# Patient Record
Sex: Male | Born: 1950 | Race: White | Hispanic: No | Marital: Married | Smoking: Former smoker
Health system: Southern US, Community
[De-identification: ages and names within clinical notes are randomized; demographics above are authoritative.]

## PROBLEM LIST (undated history)

## (undated) DIAGNOSIS — J189 Pneumonia, unspecified organism: Secondary | ICD-10-CM

## (undated) DIAGNOSIS — I2699 Other pulmonary embolism without acute cor pulmonale: Secondary | ICD-10-CM

## (undated) DIAGNOSIS — M79601 Pain in right arm: Principal | ICD-10-CM

## (undated) DIAGNOSIS — M1990 Unspecified osteoarthritis, unspecified site: Secondary | ICD-10-CM

## (undated) DIAGNOSIS — C801 Malignant (primary) neoplasm, unspecified: Secondary | ICD-10-CM

## (undated) DIAGNOSIS — I10 Essential (primary) hypertension: Secondary | ICD-10-CM

## (undated) DIAGNOSIS — J449 Chronic obstructive pulmonary disease, unspecified: Secondary | ICD-10-CM

## (undated) DIAGNOSIS — K219 Gastro-esophageal reflux disease without esophagitis: Secondary | ICD-10-CM

## (undated) DIAGNOSIS — Z923 Personal history of irradiation: Secondary | ICD-10-CM

## (undated) DIAGNOSIS — E785 Hyperlipidemia, unspecified: Secondary | ICD-10-CM

## (undated) DIAGNOSIS — K635 Polyp of colon: Secondary | ICD-10-CM

## (undated) DIAGNOSIS — J9691 Respiratory failure, unspecified with hypoxia: Secondary | ICD-10-CM

## (undated) HISTORY — DX: Pain in right arm: M79.601

## (undated) HISTORY — PX: INGUINAL HERNIA REPAIR: SUR1180

## (undated) HISTORY — DX: Hyperlipidemia, unspecified: E78.5

## (undated) HISTORY — DX: Polyp of colon: K63.5

## (undated) HISTORY — DX: Essential (primary) hypertension: I10

## (undated) HISTORY — DX: Gastro-esophageal reflux disease without esophagitis: K21.9

## (undated) HISTORY — PX: FINGER SURGERY: SHX640

---

## 1999-05-14 ENCOUNTER — Ambulatory Visit (HOSPITAL_BASED_OUTPATIENT_CLINIC_OR_DEPARTMENT_OTHER): Admission: RE | Admit: 1999-05-14 | Discharge: 1999-05-14 | Payer: Self-pay | Admitting: Otolaryngology

## 2003-07-26 ENCOUNTER — Ambulatory Visit (HOSPITAL_COMMUNITY): Admission: RE | Admit: 2003-07-26 | Discharge: 2003-07-26 | Payer: Self-pay | Admitting: Internal Medicine

## 2004-02-05 ENCOUNTER — Encounter: Admission: RE | Admit: 2004-02-05 | Discharge: 2004-02-05 | Payer: Self-pay | Admitting: General Surgery

## 2004-02-07 ENCOUNTER — Ambulatory Visit (HOSPITAL_BASED_OUTPATIENT_CLINIC_OR_DEPARTMENT_OTHER): Admission: RE | Admit: 2004-02-07 | Discharge: 2004-02-07 | Payer: Self-pay | Admitting: General Surgery

## 2004-02-07 ENCOUNTER — Ambulatory Visit (HOSPITAL_COMMUNITY): Admission: RE | Admit: 2004-02-07 | Discharge: 2004-02-07 | Payer: Self-pay | Admitting: General Surgery

## 2008-09-23 ENCOUNTER — Emergency Department (HOSPITAL_COMMUNITY): Admission: EM | Admit: 2008-09-23 | Discharge: 2008-09-23 | Payer: Self-pay | Admitting: Emergency Medicine

## 2010-07-12 NOTE — Op Note (Signed)
NAME:  Ricky Villanueva, Ricky Villanueva                          ACCOUNT NO.:  [id number]   MEDICAL RECORD NO.:  [id number]                   PATIENT TYPE:  AMB   LOCATION:  DAY                                  FACILITY:  [facility]   PHYSICIAN:  Lionel December, M.D.                 DATE OF BIRTH:  03-08-1950   DATE OF PROCEDURE:  07/26/2003  DATE OF DISCHARGE:                                 OPERATIVE REPORT   PROCEDURE:  Total colonoscopy.   INDICATION:  Ricky Villanueva is a 60 year old, Caucasian male, who is here for  screening colonoscopy.  Family history is negative for colorectal carcinoma.   Procedure risks were reviewed with the patient.  Informed consent was  obtained.   PREOPERATIVE MEDICATIONS:  1. Demerol 50 mg IV.  2. Versed 10 mg IV in divided dose.   FINDINGS:  Procedure performed in endoscopy suite.  The patient's vital  signs and 02 saturations were monitored during the procedure and remained  stable.  The patient was placed in left lateral decubitus position and  rectal examination performed.  No abnormality noted in external or digital  exam.  Olympus video scope was placed in the rectum and advanced under  vision to the sigmoid colon and beyond.  Preparation was satisfactory.  Scope was passed in the cecum which was identified by ileocecal valve and  appendiceal orifice.  Pictures taken for the record.  As the scope was  withdrawn, colonic mucosa was carefully examined.  There was a single small  polyp at sigmoid colon which was ablated via cold biopsy.  Mucosa of rest of  the colon and rectum was normal.  The scope was retroflexed in rectum to  examine anorectal junction, and moderate-sized hemorrhoids were noted below  the dentate line. Endoscope was straightened and withdrawn.  The patient  tolerated the procedure well.   FINAL DIAGNOSES:  1. Small polyp at sigmoid colon which was ablated via cold biopsy.  2. Moderate-sized external hemorrhoids.   RECOMMENDATIONS:  1. Standard  instructions given.  2. I will be contacting patient with biopsy results and further     recommendations.      ___________________________________________                                            Lionel December, M.D.   NR/MEDQ  D:  07/26/2003  T:  07/27/2003  Job:  [id number]   cc:   Colon Flattery, D.O.  [address]  [place]  [place] [address]  Fax: [phone]

## 2010-07-12 NOTE — Op Note (Signed)
[place]  Patient:    Ricky Villanueva, Ricky Villanueva                       MRN: [id number] Proc. Date: 05/14/99 Adm. Date:  [id number] Attending:  Carlean Purl CC:         Kristine Garbe. Ezzard Standing, M.D.                           Operative Report  PREOPERATIVE DIAGNOSIS: 1. Right forehead sebaceous cyst. 2. Right nasal nodule.  POSTOPERATIVE DIAGNOSIS: 1. Right forehead sebaceous cyst. 2. Right nasal nodule.  OPERATION PERFORMED: 1. Excision of right forehead sebaceous cyst. 2. Excision of right intranasal cartilage nodule.  SURGEON:  Kristine Garbe. Ezzard Standing, M.D.  ANESTHESIA:  Local Xylocaine with epinephrine.  COMPLICATIONS:  None.  INDICATIONS FOR PROCEDURE:  [name] is a 60 year old gentleman who has had a persistent nodule on his forehead ever since he had trauma to the forehead several years ago.  He also has a nodule inside his right nostril adjacent to the cartilaginous septum which he states intermittently swells up, causes pain a couple of times every year.  On examination, he has approximately 1 cm nodule toward the end of the cartilaginous septum.  In addition, he has a small nodule on the forehead consistent with a probable cyst.  He is taken to the operating room at  this time for excision of these two lesions under local anesthetic.  DESCRIPTION OF PROCEDURE:  First the forehead was prepped with Betadine solution. The area was injected with Xylocaine with epinephrine.  A small incision was made over the nodule and a sebaceous cyst was encountered.  This was removed, sent to pathology.  The small defect was closed with single 5-0 nylon suture.  Next, the nose was injected with Xylocaine with epinephrine.  A small incision was made directly over the nodule at the superior cephalad portion of the cartilaginous septum.  A nodular mass was encountered and this appeared to represent just cartilage.  There was no  cyst involved.  Dissection around this cartilage again  revealed no obvious cyst.  The nodular cartilage was removed and the defect was  closed with a single interrupted 4-0 chromic suture.  Bacitracin ointment was applied to both excision sites.  The patient tolerated the procedure well and was discharged home.  DISPOSITION:  The patient will follow up in my office in two weeks for recheck nd have the sutures removed.  He was given Tylenol p.r.n. pain. DD:  05/14/99 TD:  05/15/99 Job: [id number]

## 2010-07-12 NOTE — Op Note (Signed)
NAME:  Ricky Villanueva, Ricky Villanueva                ACCOUNT NO.:  [id number]   MEDICAL RECORD NO.:  [id number]          PATIENT TYPE:  AMB   LOCATION:  DSC                          FACILITY:  [facility]   PHYSICIAN:  Ollen Gross. Vernell Morgans, M.D. DATE OF BIRTH:  10/18/1950   DATE OF PROCEDURE:  02/07/2004  DATE OF DISCHARGE:  02/07/2004                                 OPERATIVE REPORT   PREOPERATIVE DIAGNOSIS:  Right inguinal hernia.   POSTOPERATIVE DIAGNOSIS:  Right direct inguinal hernia.   OPERATION PERFORMED:  Right inguinal hernia repair with mesh.   SURGEON:  Ollen Gross. Carolynne Edouard, M.D.   ANESTHESIA:  General via LMA.   DESCRIPTION OF PROCEDURE:  After informed consent was obtained, the patient  was brought to the operating room and placed in supine position on the  operating table.  After adequate induction of general anesthesia, the  patient's abdomen and right groin were prepped with Betadine and draped in  the usual sterile manner.  The right groin was then infiltrated with 0.25%  Marcaine.  A small incision was made from the edge of the pubic tubercle on  the right towards the anterior superior iliac spine for a distance of about  5 cm.  This incision was carried down through the skin and subcutaneous  tissue sharply with the electrocautery.  A small bridging vein was  encountered.  It was clamped with hemostats, divided and ligated with 3-0  silk sites.  Dissection was then carried deep to this sharply with the  electrocautery until the fascia of the external oblique was encountered.  The external oblique fascia was opened along its fibers towards the apex of  the external ring sharply with a 15 blade knife and Metzenbaum scissors.  The ilioinguinal nerve was identified and was involved with some scar  tissue.  It was ligated both proximally and distally with hemostats, divided  and ligated with 3-0 silk ties.  A Weitlaner retractor was deployed.  Blunt  dissection was then carried out at the edge of the  pubic tubercle until the  cord structures could be surrounded between two fingers.  A half inch  Penrose drain was placed around the cord structures for retraction purposes.  The cord structures were then skeletonized by a combination of blunt  dissection with hemostats and sharp dissection with the electrocautery.  No  hernia sac was identified within the cord structures.  There was a broad  based small medial defect to this in the floor of the inguinal canal.  This  was able to be easily reduced and repaired with interrupted 0 Vicryl  stitches.  A 3 x 6 piece of Davol mesh was then chosen and cut to fit.  Tails were cut in the mesh laterally.  The mesh was then sewed inferiorly to  the shelving edge of the inguinal ligament using running 2-0 Prolene stitch.  The mesh was sewed superiorly to the muscular aponeurotic strength layer of  the transversalis using interrupted 2-0 Prolene vertical mattress stitches.  The tails of the mesh were wrapped around the cord structures and anchored  laterally  to the shelving edge of the inguinal ligament using an interrupted  2-0 Prolene stitch.  Once this was accomplished, the mesh was in good  position without any tension.  The wound was then irrigated with copious  amounts of saline.  The external oblique was reapproximated with a running 2-  0 Vicryl stitch.  The wound was infiltrated with the rest of .25% Marcaine.  The subcutaneous fascia was closed with a running 3-0 Vicryl stitch and  the skin was closed with a running 4-0 Monocryl subcuticular stitch.  Benzoin and Steri-Strips and sterile dressings were applied.  The patient  tolerated the procedure well.  At the end of the case all sponge, needle and  instrument counts were correct.  The patient was then awakened and taken to  the recovery room in stable condition.      [name]   PST/MEDQ  D:  02/09/2004  T:  02/09/2004  Job:  [id number]

## 2012-05-20 ENCOUNTER — Telehealth: Payer: Self-pay | Admitting: Family Medicine

## 2012-05-20 NOTE — Telephone Encounter (Signed)
wtbs with fpw. Acid reflux? Eats zantac like crazy

## 2012-05-20 NOTE — Telephone Encounter (Signed)
appt given for 05/25/12

## 2012-05-25 ENCOUNTER — Encounter: Payer: Self-pay | Admitting: Family Medicine

## 2012-05-25 ENCOUNTER — Ambulatory Visit (INDEPENDENT_AMBULATORY_CARE_PROVIDER_SITE_OTHER): Payer: BC Managed Care – PPO | Admitting: Family Medicine

## 2012-05-25 VITALS — BP 144/85 | HR 63 | Temp 97.1°F | Ht 68.0 in | Wt 140.0 lb

## 2012-05-25 DIAGNOSIS — R7989 Other specified abnormal findings of blood chemistry: Secondary | ICD-10-CM

## 2012-05-25 DIAGNOSIS — R12 Heartburn: Secondary | ICD-10-CM

## 2012-05-25 DIAGNOSIS — R079 Chest pain, unspecified: Secondary | ICD-10-CM

## 2012-05-25 MED ORDER — OMEPRAZOLE 40 MG PO CPDR: 40.0000 mg | DELAYED_RELEASE_CAPSULE | Freq: Every day | ORAL | Status: DC

## 2012-05-25 NOTE — Patient Instructions (Signed)
Cholecystitis Cholecystitis is an inflammation of your gallbladder. It is usually caused by a buildup of gallstones or sludge (cholelithiasis) in your gallbladder. The gallbladder stores a fluid that helps digest fats (bile). Cholecystitis is serious and needs treatment right away.  CAUSES   Gallstones. Gallstones can block the tube that leads to your gallbladder, causing bile to build up. As bile builds up, the gallbladder becomes inflamed.  Bile duct problems, such as blockage from scarring or kinking.  Tumors. Tumors can stop bile from leaving your gallbladder correctly, causing bile to build up. As bile builds up, the gallbladder becomes inflamed. SYMPTOMS   Nausea.  Vomiting.  Abdominal pain, especially in the upper right area of your abdomen.  Abdominal tenderness or bloating.  Sweating.  Chills.  Fever.  Yellowing of the skin and the whites of the eyes (jaundice). DIAGNOSIS  Your caregiver may order blood tests to look for infection or gallbladder problems. Your caregiver may also order imaging tests, such as an ultrasound or computed tomography (CT) scan. Further tests may include a hepatobiliary iminodiacetic acid (HIDA) scan. This scan allows your caregiver to see your bile move from the liver to the gallbladder and to the small intestine. TREATMENT  A hospital stay is usually necessary to lessen the inflammation of your gallbladder. You may be required to not eat or drink (fast) for a certain amount of time. You may be given medicine to treat pain or an antibiotic medicine to treat an infection. Surgery may be needed to remove your gallbladder (cholecystectomy) once the inflammation has gone down. Surgery may be needed right away if you develop complications such as death of gallbladder tissue (gangrene) or a tear (perforation) of the gallbladder.  HOME CARE INSTRUCTIONS  Home care will depend on your treatment. In general:  If you were given antibiotics, take them as  directed. Finish them even if you start to feel better.  Only take over-the-counter or prescription medicines for pain, discomfort, or fever as directed by your caregiver.  Follow a low-fat diet until you see your caregiver again.  Keep all follow-up visits as directed by your caregiver. SEEK IMMEDIATE MEDICAL CARE IF:   Your pain is increasing and not controlled by medicines.  Your pain moves to another part of your abdomen or to your back.  You have a fever.  You have nausea and vomiting. MAKE SURE YOU:  Understand these instructions.  Will watch your condition.  Will get help right away if you are not doing well or get worse. Document Released: 02/10/2005 Document Revised: 05/05/2011 Document Reviewed: 12/27/2010 [facility] Patient Information 2013 [place], [place].   Heartburn Heartburn is a painful, burning sensation in the chest. It may feel worse in certain positions, such as lying down or bending over. It is caused by stomach acid backing up into the tube that carries food from the mouth down to the stomach (lower esophagus).  CAUSES   Large meals.  Certain foods and drinks.  Exercise.  Increased acid production.  Being overweight or obese.  Certain medicines. SYMPTOMS   Burning pain in the chest or lower throat.  Bitter taste in the mouth.  Coughing. DIAGNOSIS  If the usual treatments for heartburn do not improve your symptoms, then tests may be done to see if there is another condition present. Possible tests may include:  X-rays.  Endoscopy. This is when a tube with a light and a camera on the end is used to examine the esophagus and the stomach.  A  test to measure the amount of acid in the esophagus (pH test).  A test to see if the esophagus is working properly (esophageal manometry).  Blood, breath, or stool tests to check for bacteria that cause ulcers. TREATMENT   Your caregiver may tell you to use certain over-the-counter medicines  (antacids, acid reducers) for mild heartburn.  Your caregiver may prescribe medicines to decrease the acid in your stomach or protect your stomach lining.  Your caregiver may recommend certain diet changes.  For severe cases, your caregiver may recommend that the head of your bed be elevated on blocks. (Sleeping with more pillows is not an effective treatment as it only changes the position of your head and does not improve the main problem of stomach acid refluxing into the esophagus.) HOME CARE INSTRUCTIONS   Take all medicines as directed by your caregiver.  Raise the head of your bed by putting blocks under the legs if instructed to by your caregiver.  Do not exercise right after eating.  Avoid eating 2 or 3 hours before bed. Do not lie down right after eating.  Eat small meals throughout the day instead of 3 large meals.  Stop smoking if you smoke.  Maintain a healthy weight.  Identify foods and beverages that make your symptoms worse and avoid them. Foods you may want to avoid include:  Peppers.  Chocolate.  High-fat foods, including fried foods.  Spicy foods.  Garlic and onions.  Citrus fruits, including oranges, grapefruit, lemons, and limes.  Food containing tomatoes or tomato products.  Mint.  Carbonated drinks, caffeinated drinks, and alcohol.  Vinegar. SEEK IMMEDIATE MEDICAL CARE IF:  You have severe chest pain that goes down your arm or into your jaw or neck.  You feel sweaty, dizzy, or lightheaded.  You are short of breath.  You vomit blood.  You have difficulty or pain with swallowing.  You have bloody or black, tarry stools.  You have episodes of heartburn more than 3 times a week for more than 2 weeks. MAKE SURE YOU:  Understand these instructions.  Will watch your condition.  Will get help right away if you are not doing well or get worse. Document Released: 06/29/2008 Document Revised: 05/05/2011 Document Reviewed:  07/28/2010 [facility] Patient Information 2013 [place], [place].

## 2012-05-25 NOTE — Progress Notes (Signed)
Subjective:     Patient ID: Ricky Villanueva, male   DOB: [birth year], 62 y.o.   MRN: [id number]  HPI Thinks he has uncontrolled GERD. Belching a lot. Has bed up almost 6 inches elevated. Eats around 5-6 pm. Sleeps at 9-9:30 pm. Feels fluid up in the throat. Works at [employer] in [employer]. While working he doesn't get any chest.Shortness of Breath with heavy exertion but no more than the normal. 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 But he is to consume a large. The heartburn can be intense right after a meal. Followed by a lot of gas and belching.  Ex-smoker.    PMH/PSH: reviewed/updated in Epic  SH/FH: reviewed/updated in Epic  Allergies: reviewed/updated in Epic  Medications: reviewed/updated in Epic  Immunizations: reviewed/updated in Epic     Review of Systems  Constitutional: Positive for unexpected weight change.       Weight gain post stopping smoking  HENT: Negative.   Eyes: Negative.   Respiratory: Negative.   Cardiovascular: Negative.   Gastrointestinal: Positive for abdominal distention. Negative for nausea, vomiting, abdominal pain, diarrhea, constipation, blood in stool, anal bleeding and rectal pain.  Endocrine: Negative.   Genitourinary: Negative.   Musculoskeletal: Negative.   Allergic/Immunologic: Negative.   Neurological: Negative.   Hematological: Negative.   Psychiatric/Behavioral: Negative.        Objective:   Physical Exam APPEARANCE:  Patient in no acute distress.The patient appeared well nourished and normally developed. Acyanotic.  VITAL SIGNS:  SKIN: warm and  Dry without overt rashes, tattoos and scars  HEAD and Neck: without JVD, Normal No scleral icterus  CHEST & LUNGS: Clear  CVS: Reveals the PMI to be normally located. Regular rhythm, First and Second Heart sounds are normal, and absence of murmurs, rubs or gallops.  ABDOMEN:  Benign,, no organomegaly, no masses, no Abdominal Aortic  enlargement. No Guarding , no rebound. No Bruits.  RECTAL: Not indicated  GU: Not indicated  EXTREMETIES: nonedematous. Both Femoral and Pedal pulses are normal.  MUSCULOSKELETAL:  Spine: normal Joints: intact  NEUROLOGIC: oriented to time,place and person; nonfocal. Strength is normal Sensory is normal Reflexes are normal Cranial Nerves are normal.     Assessment:     Heartburn - Plan: Helicobacter pylori abs-IgG+IgA, bld, Amylase, Lipase, EKG 12-Lead, omeprazole (PRILOSEC) 40 MG capsule, US Abdomen Limited RUQ  Chest pain - Plan: EKG 12-Lead, US Abdomen Limited RUQ       Plan:          Orders Placed This Encounter  Procedures  . US Abdomen Limited RUQ    Standing Status: Future     Number of Occurrences:      Standing Expiration Date: 07/25/2013    Order Specific Question:  Reason for Exam (SYMPTOM  OR DIAGNOSIS REQUIRED)    Answer:  heartburn with meals    Order Specific Question:  Reason for Exam (SYMPTOM  OR DIAGNOSIS REQUIRED)    Answer:  flatulence    Order Specific Question:  Preferred imaging location?    Answer:  [facility]  . Helicobacter pylori abs-IgG+IgA, bld  . Amylase  . Lipase  . EKG 12-Lead   No results found for this or any previous visit (from the past 24 hour(s)).                               Meds ordered this encounter  Medications  . ranitidine (ZANTAC)  150 MG capsule    Sig: Take 150 mg by mouth daily.  [name] lisinopril (PRINIVIL,ZESTRIL) 10 MG tablet    Sig: Take 5 mg by mouth daily.  [name] omeprazole (PRILOSEC) 40 MG capsule    Sig: Take 1 capsule (40 mg total) by mouth daily.    Dispense:  30 capsule    Refill:  3   patient already had a CBC CMP lipid panel TSH vitamin D level drawn today at work no need for these additional labs here. Await a gallbladder ultrasound. Referral made for this.  Ely Spragg P. Modesto Charon, M.D.

## 2012-05-26 ENCOUNTER — Other Ambulatory Visit: Payer: Self-pay | Admitting: Family Medicine

## 2012-05-26 ENCOUNTER — Other Ambulatory Visit: Payer: Self-pay | Admitting: *Deleted

## 2012-05-26 DIAGNOSIS — R12 Heartburn: Secondary | ICD-10-CM

## 2012-05-26 DIAGNOSIS — R079 Chest pain, unspecified: Secondary | ICD-10-CM

## 2012-05-26 LAB — LIPASE: Lipase: 31 U/L (ref 0–75)

## 2012-05-26 LAB — HELICOBACTER PYLORI ABS-IGG+IGA, BLD
H Pylori IgG: 2.38 {ISR} — ABNORMAL HIGH
HELICOBACTER PYLORI AB, IGA: 1.7 U/mL (ref <9.0)

## 2012-05-26 LAB — AMYLASE: Amylase: 67 U/L (ref 0–105)

## 2012-05-26 NOTE — Progress Notes (Signed)
Quick Note:  Labs abnormal.H Pylori is positive. Needs a stool test to see if he is infected with H Pylori. Orders in Epic. Please let him know. Thanks. ______

## 2012-05-26 NOTE — Progress Notes (Signed)
Original order deleted in error.

## 2012-05-27 NOTE — Addendum Note (Signed)
Addended by: Orma Render F on: 05/27/2012 04:27 PM   Modules accepted: Orders

## 2012-05-31 ENCOUNTER — Other Ambulatory Visit: Payer: Self-pay | Admitting: Family Medicine

## 2012-05-31 ENCOUNTER — Ambulatory Visit (HOSPITAL_COMMUNITY)
Admission: RE | Admit: 2012-05-31 | Discharge: 2012-05-31 | Disposition: A | Discharge status: Home / Self Care | Payer: BC Managed Care – PPO | Source: Ambulatory Visit | Attending: Family Medicine | Admitting: Family Medicine

## 2012-05-31 ENCOUNTER — Other Ambulatory Visit (INDEPENDENT_AMBULATORY_CARE_PROVIDER_SITE_OTHER): Payer: BC Managed Care – PPO

## 2012-05-31 DIAGNOSIS — R079 Chest pain, unspecified: Secondary | ICD-10-CM

## 2012-05-31 DIAGNOSIS — K219 Gastro-esophageal reflux disease without esophagitis: Secondary | ICD-10-CM | POA: Insufficient documentation

## 2012-05-31 DIAGNOSIS — R1011 Right upper quadrant pain: Secondary | ICD-10-CM | POA: Insufficient documentation

## 2012-05-31 DIAGNOSIS — R12 Heartburn: Secondary | ICD-10-CM

## 2012-05-31 DIAGNOSIS — R7989 Other specified abnormal findings of blood chemistry: Secondary | ICD-10-CM

## 2012-05-31 NOTE — Progress Notes (Signed)
Patient came and dropped off specimen sample for h.pylori

## 2012-06-01 LAB — HELICOBACTER PYLORI  SPECIAL ANTIGEN: H. PYLORI Antigen: NEGATIVE

## 2012-06-02 ENCOUNTER — Telehealth: Payer: Self-pay | Admitting: Family Medicine

## 2012-06-02 NOTE — Progress Notes (Signed)
Quick Note:  Labs abnormal. Fatty liver. On ultrasound. No gallbladder problem or gallstones. No explanation for his pain was found. No change in plans keep the follow up appointment. Suggest that he changes his diet to reduce any fattyt, fried foods and sugary sweet, quickly digested carbohydrates out of his diet. ______

## 2012-06-02 NOTE — Progress Notes (Signed)
Quick Note:  Call patient. Labs normal. No change in plan. ______ 

## 2012-06-03 ENCOUNTER — Encounter: Payer: Self-pay | Admitting: *Deleted

## 2012-06-04 NOTE — Telephone Encounter (Signed)
Pt aware of labs  

## 2012-07-01 ENCOUNTER — Encounter: Payer: Self-pay | Admitting: Family Medicine

## 2012-07-01 ENCOUNTER — Ambulatory Visit (INDEPENDENT_AMBULATORY_CARE_PROVIDER_SITE_OTHER): Payer: BC Managed Care – PPO | Admitting: Family Medicine

## 2012-07-01 VITALS — BP 148/85 | HR 53 | Temp 97.0°F | Ht 65.0 in | Wt 140.8 lb

## 2012-07-01 DIAGNOSIS — Z1211 Encounter for screening for malignant neoplasm of colon: Secondary | ICD-10-CM | POA: Insufficient documentation

## 2012-07-01 DIAGNOSIS — E785 Hyperlipidemia, unspecified: Secondary | ICD-10-CM | POA: Insufficient documentation

## 2012-07-01 DIAGNOSIS — K219 Gastro-esophageal reflux disease without esophagitis: Secondary | ICD-10-CM | POA: Insufficient documentation

## 2012-07-01 MED ORDER — RANITIDINE HCL 300 MG PO CAPS: 300.0000 mg | ORAL_CAPSULE | Freq: Every day | ORAL | Status: DC

## 2012-07-01 MED ORDER — PANTOPRAZOLE SODIUM 40 MG PO TBEC: 40.0000 mg | DELAYED_RELEASE_TABLET | Freq: Every day | ORAL | Status: DC

## 2012-07-01 NOTE — Assessment & Plan Note (Signed)
Will refer to GI. Will optimize treatment with both Protonix and maximum Zantac. Continue GERD precautions.

## 2012-07-01 NOTE — Progress Notes (Signed)
Patient ID: Ricky Villanueva, male   DOB: [birth year], 62 y.o.   MRN: [id number] SUBJECTIVE: HPI: GERD Paitent complains of heartburn. This has been associated with abdominal bloating, belching, deep pressure at base of neck, heartburn, hoarseness and laryngitis.  He denies chest pain, difficulty swallowing, melena, midespigastric pain, nausea, nocturnal burning and odynophagia. Symptoms have been present for 4 years. . He has not lost weight. He denies melena, hematochezia, hematemesis, and coffee ground emesis. Medical therapy in the past has included H2 antagonists and proton pump inhibitors.Meds has improved his  Symptoms but not enough.   PMH/PSH: reviewed/updated in Epic  SH/FH: reviewed/updated in Epic  Allergies: reviewed/updated in Epic  Medications: reviewed/updated in Epic  Immunizations: reviewed/updated in Epic  ROS: As above in the HPI. All other systems are stable or negative.  OBJECTIVE: APPEARANCE:  Patient in no acute distress.The patient appeared well nourished and normally developed. Acyanotic. Waist: VITAL SIGNS:BP 148/85  Pulse 53  Temp(Src) 97 F (36.1 C) (Oral)  Ht 5\' 5"  (1.651 m)  Wt 140 lb 12.8 oz (63.866 kg)  BMI 23.43 kg/m2  WM NAD  SKIN: warm and  Dry without overt rashes, tattoos and scars  HEAD and Neck: without JVD, Head and scalp: normal Eyes:No scleral icterus. Fundi normal, eye movements normal. Ears: Auricle normal, canal normal, Tympanic membranes normal, insufflation normal. Nose: normal Throat: normal Neck & thyroid: normal  CHEST & LUNGS: Chest wall: normal Lungs: Clear  CVS: Reveals the PMI to be normally located. Regular rhythm, First and Second Heart sounds are normal,  absence of murmurs, rubs or gallops. Peripheral vasculature: Radial pulses: normal Dorsal pedis pulses: normal Posterior pulses: normal  ABDOMEN:  Appearance: normal Benign,, no organomegaly, no masses, no Abdominal Aortic enlargement. No Guarding , no  rebound. No Bruits. Bowel sounds: normal  RECTAL: N/A GU: N/A  EXTREMETIES: nonedematous. Both Femoral and Pedal pulses are normal.  MUSCULOSKELETAL:  Spine: normal Joints: intact  NEUROLOGIC: oriented to time,place and person; nonfocal. Strength is normal Sensory is normal Reflexes are normal Cranial Nerves are normal.  ASSESSMENT GERD (gastroesophageal reflux disease) Dietary changes, handout in the AVS. Discussed.   Will refer to GI. Will optimize treatment with both Protonix and maximum Zantac. Continue GERD precautions.  GERD (gastroesophageal reflux disease) - Plan: ranitidine (ZANTAC) 300 MG capsule, pantoprazole (PROTONIX) 40 MG tablet, Ambulatory referral to Gastroenterology  HLD (hyperlipidemia)  Colon cancer screening    PLan: Results for orders placed in visit on 05/31/12  HELICOBACTER PYLORI  SPECIAL ANTIGEN      Result Value Range   H. PYLORI Antigen  NEGATIVE     H. PYLORI Antigen  Antimicrobials,proton pump inhibitors and bismuth     H. PYLORI Antigen        Value: preparations are known to suppress H.pylori and ingestion of   H. PYLORI Antigen        Value: these prior to testing may cause a false negative result. If   H. PYLORI Antigen        Value: a negative result is obtained for a patient that has    H. PYLORI Antigen  ingested these compounds within two weeks prior of     H. PYLORI Antigen        Value: performing the H.pylori test, results may be falsely   H. PYLORI Antigen        Value: negative and should be repeated with a new specimen two   H. PYLORI Antigen  weeks after discontinuing treatment.  Meds ordered this encounter  Medications  . Cholecalciferol (VITAMIN D) 2000 UNITS CAPS    Sig: Take 1 capsule by mouth daily.  . ranitidine (ZANTAC) 300 MG capsule    Sig: Take 1 capsule (300 mg total) by mouth daily.    Dispense:  30 capsule    Refill:  5  . pantoprazole (PROTONIX) 40 MG tablet    Sig: Take 1 tablet (40 mg  total) by mouth daily.    Dispense:  30 tablet    Refill:  5   Orders Placed This Encounter  Procedures  . Ambulatory referral to Gastroenterology    Referral Priority:  Routine    Referral Type:  Consultation    Referral Reason:  Specialty Services Required    Requested Specialty:  Gastroenterology    Number of Visits Requested:  1    Handouts in the AVS.  RTC 2 months.  Kingdom Vanzanten P. Modesto Charon, M.D.

## 2012-07-01 NOTE — Patient Instructions (Addendum)
Gastroesophageal Reflux Disease, Adult Gastroesophageal reflux disease (GERD) happens when acid from your stomach flows up into the esophagus. When acid comes in contact with the esophagus, the acid causes soreness (inflammation) in the esophagus. Over time, GERD may create small holes (ulcers) in the lining of the esophagus. CAUSES   Increased body weight. This puts pressure on the stomach, making acid rise from the stomach into the esophagus.  Smoking. This increases acid production in the stomach.  Drinking alcohol. This causes decreased pressure in the lower esophageal sphincter (valve or ring of muscle between the esophagus and stomach), allowing acid from the stomach into the esophagus.  Late evening meals and a full stomach. This increases pressure and acid production in the stomach.  A malformed lower esophageal sphincter. Sometimes, no cause is found. SYMPTOMS   Burning pain in the lower part of the mid-chest behind the breastbone and in the mid-stomach area. This may occur twice a week or more often.  Trouble swallowing.  Sore throat.  Dry cough.  Asthma-like symptoms including chest tightness, shortness of breath, or wheezing. DIAGNOSIS  Your caregiver may be able to diagnose GERD based on your symptoms. In some cases, X-rays and other tests may be done to check for complications or to check the condition of your stomach and esophagus. TREATMENT  Your caregiver may recommend over-the-counter or prescription medicines to help decrease acid production. Ask your caregiver before starting or adding any new medicines.  HOME CARE INSTRUCTIONS   Change the factors that you can control. Ask your caregiver for guidance concerning weight loss, quitting smoking, and alcohol consumption.  Avoid foods and drinks that make your symptoms worse, such as:  Caffeine or alcoholic drinks.  Chocolate.  Peppermint or mint flavorings.  Garlic and onions.  Spicy foods.  Citrus fruits,  such as oranges, lemons, or limes.  Tomato-based foods such as sauce, chili, salsa, and pizza.  Fried and fatty foods.  Avoid lying down for the 3 hours prior to your bedtime or prior to taking a nap.  Eat small, frequent meals instead of large meals.  Wear loose-fitting clothing. Do not wear anything tight around your waist that causes pressure on your stomach.  Raise the head of your bed 6 to 8 inches with wood blocks to help you sleep. Extra pillows will not help.  Only take over-the-counter or prescription medicines for pain, discomfort, or fever as directed by your caregiver.  Do not take aspirin, ibuprofen, or other nonsteroidal anti-inflammatory drugs (NSAIDs). SEEK IMMEDIATE MEDICAL CARE IF:   You have pain in your arms, neck, jaw, teeth, or back.  Your pain increases or changes in intensity or duration.  You develop nausea, vomiting, or sweating (diaphoresis).  You develop shortness of breath, or you faint.  Your vomit is green, yellow, black, or looks like coffee grounds or blood.  Your stool is red, bloody, or black. These symptoms could be signs of other problems, such as heart disease, gastric bleeding, or esophageal bleeding. MAKE SURE YOU:   Understand these instructions.  Will watch your condition.  Will get help right away if you are not doing well or get worse. Document Released: 11/20/2004 Document Revised: 05/05/2011 Document Reviewed: 08/30/2010 [facility] Patient Information 2013 [place], [place]. Hypertriglyceridemia  Diet for High blood levels of Triglycerides Most fats in food are triglycerides. Triglycerides in your blood are stored as fat in your body. High levels of triglycerides in your blood may put you at a greater risk for heart disease and stroke.  Normal triglyceride levels are less than 150 mg/dL. Borderline high levels are 150-199 mg/dl. High levels are 200 - 499 mg/dL, and very high triglyceride levels are greater than 500 mg/dL. The  decision to treat high triglycerides is generally based on the level. For people with borderline or high triglyceride levels, treatment includes weight loss and exercise. Drugs are recommended for people with very high triglyceride levels. Many people who need treatment for high triglyceride levels have metabolic syndrome. This syndrome is a collection of disorders that often include: insulin resistance, high blood pressure, blood clotting problems, high cholesterol and triglycerides. TESTING PROCEDURE FOR TRIGLYCERIDES  You should not eat 4 hours before getting your triglycerides measured. The normal range of triglycerides is between 10 and 250 milligrams per deciliter (mg/dl). Some people may have extreme levels (1000 or above), but your triglyceride level may be too high if it is above 150 mg/dl, depending on what other risk factors you have for heart disease.  People with high blood triglycerides may also have high blood cholesterol levels. If you have high blood cholesterol as well as high blood triglycerides, your risk for heart disease is probably greater than if you only had high triglycerides. High blood cholesterol is one of the main risk factors for heart disease. CHANGING YOUR DIET  Your weight can affect your blood triglyceride level. If you are more than 20% above your ideal body weight, you may be able to lower your blood triglycerides by losing weight. Eating less and exercising regularly is the best way to combat this. Fat provides more calories than any other food. The best way to lose weight is to eat less fat. Only 30% of your total calories should come from fat. Less than 7% of your diet should come from saturated fat. A diet low in fat and saturated fat is the same as a diet to decrease blood cholesterol. By eating a diet lower in fat, you may lose weight, lower your blood cholesterol, and lower your blood triglyceride level.  Eating a diet low in fat, especially saturated fat, may  also help you lower your blood triglyceride level. Ask your dietitian to help you figure how much fat you can eat based on the number of calories your caregiver has prescribed for you.  Exercise, in addition to helping with weight loss may also help lower triglyceride levels.   Alcohol can increase blood triglycerides. You may need to stop drinking alcoholic beverages.  Too much carbohydrate in your diet may also increase your blood triglycerides. Some complex carbohydrates are necessary in your diet. These may include bread, rice, potatoes, other starchy vegetables and cereals.  Reduce "simple" carbohydrates. These may include pure sugars, candy, honey, and jelly without losing other nutrients. If you have the kind of high blood triglycerides that is affected by the amount of carbohydrates in your diet, you will need to eat less sugar and less high-sugar foods. Your caregiver can help you with this.  Adding 2-4 grams of fish oil (EPA+ DHA) may also help lower triglycerides. Speak with your caregiver before adding any supplements to your regimen. Following the Diet  Maintain your ideal weight. Your caregivers can help you with a diet. Generally, eating less food and getting more exercise will help you lose weight. Joining a weight control group may also help. Ask your caregivers for a good weight control group in your area.  Eat low-fat foods instead of high-fat foods. This can help you lose weight too.  These foods are  lower in fat. Eat MORE of these:   Dried beans, peas, and lentils.  Egg whites.  Low-fat cottage cheese.  Fish.  Lean cuts of meat, such as round, sirloin, rump, and flank (cut extra fat off meat you fix).  Whole grain breads, cereals and pasta.  Skim and nonfat dry milk.  Low-fat yogurt.  Poultry without the skin.  Cheese made with skim or part-skim milk, such as mozzarella, parmesan, farmers', ricotta, or pot cheese. These are higher fat foods. Eat LESS of these:     Whole milk and foods made from whole milk, such as American, blue, cheddar, monterey jack, and swiss cheese  High-fat meats, such as luncheon meats, sausages, knockwurst, bratwurst, hot dogs, ribs, corned beef, ground pork, and regular ground beef.  Fried foods. Limit saturated fats in your diet. Substituting unsaturated fat for saturated fat may decrease your blood triglyceride level. You will need to read package labels to know which products contain saturated fats.  These foods are high in saturated fat. Eat LESS of these:   Fried pork skins.  Whole milk.  Skin and fat from poultry.  Palm oil.  Butter.  Shortening.  Cream cheese.  Tomasa Blase.  Margarines and baked goods made from listed oils.  Vegetable shortenings.  Chitterlings.  Fat from meats.  Coconut oil.  Palm kernel oil.  Lard.  Cream.  Sour cream.  Fatback.  Coffee whiteners and non-dairy creamers made with these oils.  Cheese made from whole milk. Use unsaturated fats (both polyunsaturated and monounsaturated) moderately. Remember, even though unsaturated fats are better than saturated fats; you still want a diet low in total fat.  These foods are high in unsaturated fat:   Canola oil.  Sunflower oil.  Mayonnaise.  Almonds.  Peanuts.  Pine nuts.  Margarines made with these oils.  Safflower oil.  Olive oil.  Avocados.  Cashews.  Peanut butter.  Sunflower seeds.  Soybean oil.  Peanut oil.  Olives.  Pecans.  Walnuts.  Pumpkin seeds. Avoid sugar and other high-sugar foods. This will decrease carbohydrates without decreasing other nutrients. Sugar in your food goes rapidly to your blood. When there is excess sugar in your blood, your liver may use it to make more triglycerides. Sugar also contains calories without other important nutrients.  Eat LESS of these:   Sugar, brown sugar, powdered sugar, jam, jelly, preserves, honey, syrup, molasses, pies, candy, cakes,  cookies, frosting, pastries, colas, soft drinks, punches, fruit drinks, and regular gelatin.  Avoid alcohol. Alcohol, even more than sugar, may increase blood triglycerides. In addition, alcohol is high in calories and low in nutrients. Ask for sparkling water, or a diet soft drink instead of an alcoholic beverage. Suggestions for planning and preparing meals   Bake, broil, grill or roast meats instead of frying.  Remove fat from meats and skin from poultry before cooking.  Add spices, herbs, lemon juice or vinegar to vegetables instead of salt, rich sauces or gravies.  Use a non-stick skillet without fat or use no-stick sprays.  Cool and refrigerate stews and broth. Then remove the hardened fat floating on the surface before serving.  Refrigerate meat drippings and skim off fat to make low-fat gravies.  Serve more fish.  Use less butter, margarine and other high-fat spreads on bread or vegetables.  Use skim or reconstituted non-fat dry milk for cooking.  Cook with low-fat cheeses.  Substitute low-fat yogurt or cottage cheese for all or part of the sour cream in recipes for sauces, dips  or congealed salads.  Use half yogurt/half mayonnaise in salad recipes.  Substitute evaporated skim milk for cream. Evaporated skim milk or reconstituted non-fat dry milk can be whipped and substituted for whipped cream in certain recipes.  Choose fresh fruits for dessert instead of high-fat foods such as pies or cakes. Fruits are naturally low in fat. When Dining Out   Order low-fat appetizers such as fruit or vegetable juice, pasta with vegetables or tomato sauce.  Select clear, rather than cream soups.  Ask that dressings and gravies be served on the side. Then use less of them.  Order foods that are baked, broiled, poached, steamed, stir-fried, or roasted.  Ask for margarine instead of butter, and use only a small amount.  Drink sparkling water, unsweetened tea or coffee, or diet soft  drinks instead of alcohol or other sweet beverages. QUESTIONS AND ANSWERS ABOUT OTHER FATS IN THE BLOOD: SATURATED FAT, TRANS FAT, AND CHOLESTEROL What is trans fat? Trans fat is a type of fat that is formed when vegetable oil is hardened through a process called hydrogenation. This process helps makes foods more solid, gives them shape, and prolongs their shelf life. Trans fats are also called hydrogenated or partially hydrogenated oils.  What do saturated fat, trans fat, and cholesterol in foods have to do with heart disease? Saturated fat, trans fat, and cholesterol in the diet all raise the level of LDL "bad" cholesterol in the blood. The higher the LDL cholesterol, the greater the risk for coronary heart disease (CHD). Saturated fat and trans fat raise LDL similarly.  What foods contain saturated fat, trans fat, and cholesterol? High amounts of saturated fat are found in animal products, such as fatty cuts of meat, chicken skin, and full-fat dairy products like butter, whole milk, cream, and cheese, and in tropical vegetable oils such as palm, palm kernel, and coconut oil. Trans fat is found in some of the same foods as saturated fat, such as vegetable shortening, some margarines (especially hard or stick margarine), crackers, cookies, baked goods, fried foods, salad dressings, and other processed foods made with partially hydrogenated vegetable oils. Small amounts of trans fat also occur naturally in some animal products, such as milk products, beef, and lamb. Foods high in cholesterol include liver, other organ meats, egg yolks, shrimp, and full-fat dairy products. How can I use the new food label to make heart-healthy food choices? Check the Nutrition Facts panel of the food label. Choose foods lower in saturated fat, trans fat, and cholesterol. For saturated fat and cholesterol, you can also use the Percent Daily Value (%DV): 5% DV or less is low, and 20% DV or more is high. (There is no %DV for  trans fat.) Use the Nutrition Facts panel to choose foods low in saturated fat and cholesterol, and if the trans fat is not listed, read the ingredients and limit products that list shortening or hydrogenated or partially hydrogenated vegetable oil, which tend to be high in trans fat. POINTS TO REMEMBER:   Discuss your risk for heart disease with your caregivers, and take steps to reduce risk factors.  Change your diet. Choose foods that are low in saturated fat, trans fat, and cholesterol.  Add exercise to your daily routine if it is not already being done. Participate in physical activity of moderate intensity, like brisk walking, for at least 30 minutes on most, and preferably all days of the week. No time? Break the 30 minutes into three, 10-minute segments during the day.  Stop smoking. If you do smoke, contact your caregiver to discuss ways in which they can help you quit.  Do not use street drugs.  Maintain a normal weight.  Maintain a healthy blood pressure.  Keep up with your blood work for checking the fats in your blood as directed by your caregiver. Document Released: 11/29/2003 Document Revised: 08/12/2011 Document Reviewed: 06/26/2008 [facility] Patient Information 2013 [place], [place].

## 2012-07-01 NOTE — Progress Notes (Signed)
  Subjective:    Patient ID: Ricky Villanueva, male    DOB: 1951-02-03, 62 y.o.   MRN: [id number]  HPI    Review of Systems  Constitutional: Negative.   HENT: Positive for sinus pressure (pollen problems).   Eyes: Negative.   Respiratory: Positive for cough (states has smokers cough quit smoking in 2013).   Cardiovascular: Negative.   Gastrointestinal: Positive for abdominal distention.  Endocrine: Positive for polyphagia.  Genitourinary: Negative.   Musculoskeletal: Negative.   Allergic/Immunologic: Positive for environmental allergies (seasonal).  Neurological: Negative.   Hematological: Negative.   Psychiatric/Behavioral: Negative.        Objective:   Physical Exam        Assessment & Plan:

## 2012-07-01 NOTE — Assessment & Plan Note (Signed)
Dietary changes, handout in the AVS. Discussed.

## 2012-07-29 ENCOUNTER — Ambulatory Visit: Payer: Self-pay | Admitting: Family Medicine

## 2012-08-31 ENCOUNTER — Ambulatory Visit (INDEPENDENT_AMBULATORY_CARE_PROVIDER_SITE_OTHER): Payer: BC Managed Care – PPO | Admitting: Family Medicine

## 2012-08-31 ENCOUNTER — Encounter: Payer: Self-pay | Admitting: Family Medicine

## 2012-08-31 VITALS — BP 129/79 | HR 62 | Temp 97.3°F | Wt 135.2 lb

## 2012-08-31 DIAGNOSIS — J039 Acute tonsillitis, unspecified: Secondary | ICD-10-CM | POA: Insufficient documentation

## 2012-08-31 DIAGNOSIS — Z860100 Personal history of colon polyps, unspecified: Secondary | ICD-10-CM | POA: Insufficient documentation

## 2012-08-31 DIAGNOSIS — K219 Gastro-esophageal reflux disease without esophagitis: Secondary | ICD-10-CM

## 2012-08-31 MED ORDER — CEFDINIR 300 MG PO CAPS: 300.0000 mg | ORAL_CAPSULE | Freq: Two times a day (BID) | ORAL | Status: DC

## 2012-08-31 NOTE — Progress Notes (Signed)
Patient ID: Ricky Villanueva, male   DOB: [birth year], 62 y.o.   MRN: [id number] SUBJECTIVE: CC: Chief Complaint  Patient presents with  . Follow-up    2 month follow up  wants refill on omperazole .ck throat  had endo/colon    HPI: Throat has been sore since the EGD 3 weeks ago especially the left side of the neck like there are swollen glands. No fever.Has had the Colonoscopy as well and had a polyp. Was told to get another colonoscopy in 5 years. GI told him to continue with the PPI and follow up with his PCP. Has  A refill.  Past Medical History  Diagnosis Date  . Hypertension   . GERD (gastroesophageal reflux disease)    Past Surgical History  Procedure Laterality Date  . Hernia repair      Right inguinal   History   Social History  . Marital Status: Single    Spouse Name: N/A    Number of Children: N/A  . Years of Education: N/A   Occupational History  . Not on file.   Social History Main Topics  . Smoking status: Former Smoker    Quit date: 12/26/2011  . Smokeless tobacco: Not on file  . Alcohol Use: No  . Drug Use: No  . Sexually Active: Not on file   Other Topics Concern  . Not on file   Social History Narrative  . No narrative on file   No family history on file. Current Outpatient Prescriptions on File Prior to Visit  Medication Sig Dispense Refill  . ranitidine (ZANTAC) 300 MG capsule Take 1 capsule (300 mg total) by mouth daily.  30 capsule  5  . Cholecalciferol (VITAMIN D) 2000 UNITS CAPS Take 1 capsule by mouth daily.      [name] lisinopril (PRINIVIL,ZESTRIL) 10 MG tablet Take 10 mg by mouth daily.        No current facility-administered medications on file prior to visit.   Allergies  Allergen Reactions  . Augmentin (Amoxicillin-Pot Clavulanate)     There is no immunization history on file for this patient. Prior to Admission medications   Medication Sig Start Date End Date Taking? Authorizing Provider  ranitidine (ZANTAC) 300 MG capsule Take 1  capsule (300 mg total) by mouth daily. 07/01/12  Yes Ricky Ladd, MD  cefdinir (OMNICEF) 300 MG capsule Take 1 capsule (300 mg total) by mouth 2 (two) times daily. 08/31/12   Ricky Ladd, MD  Cholecalciferol (VITAMIN D) 2000 UNITS CAPS Take 1 capsule by mouth daily.    Historical Provider, MD  lisinopril (PRINIVIL,ZESTRIL) 10 MG tablet Take 10 mg by mouth daily.     Historical Provider, MD  omeprazole (PRILOSEC) 40 MG capsule  07/28/12   Historical Provider, MD    ROS: As above in the HPI. All other systems are stable or negative.  OBJECTIVE: APPEARANCE:  Patient in no acute distress.The patient appeared well nourished and normally developed. Acyanotic. Waist: VITAL SIGNS:BP 129/79  Pulse 62  Temp(Src) 97.3 F (36.3 C) (Oral)  Wt 135 lb 3.2 oz (61.326 kg)  BMI 22.5 kg/m2 WM  SKIN: warm and  Dry without overt rashes, tattoos and scars  HEAD and Neck: without JVD, Head and scalp: normal Eyes:No scleral icterus. Fundi normal, eye movements normal. Ears: Auricle normal, canal normal, Tympanic membranes normal, insufflation normal. Nose: normal Throat: the left tonsil has an exudate and is  Red. The left upper cerviacl lymph node is shotty but non tender  and less than a cm in diameter. thyroid: normal  CHEST & LUNGS: Chest wall: normal Lungs: Clear  CVS: Reveals the PMI to be normally located. Regular rhythm, First and Second Heart sounds are normal,  absence of murmurs, rubs or gallops.  ABDOMEN:  Appearance: normal Benign, no organomegaly, no masses, no Abdominal Aortic enlargement. No Guarding , no rebound. No Bruits. Bowel sounds: normal  RECTAL: N/A GU: N/A  EXTREMETIES: nonedematous.  MUSCULOSKELETAL:  Spine: normal Joints: intact  NEUROLOGIC: oriented to time,place and person; nonfocal.  DATABASE: Reviewed the colonoscope reports and the EGD reports which is  Reflective of small polyp and GERD.  ASSESSMENT: Acute tonsillitis - Plan: cefdinir (OMNICEF)  300 MG capsule  GERD (gastroesophageal reflux disease)  Personal history of colonic polyps  PLAN:  Meds ordered this encounter  Medications  . omeprazole (PRILOSEC) 40 MG capsule    Sig:   . cefdinir (OMNICEF) 300 MG capsule    Sig: Take 1 capsule (300 mg total) by mouth 2 (two) times daily.    Dispense:  20 capsule    Refill:  0   Saline gargles Continue with the PPI. RTC if not resolved in 2 weeks.  Return in about 3 months (around 12/01/2012).  [place] Ricky Villanueva P. Ricky Villanueva, M.D.

## 2012-10-14 ENCOUNTER — Other Ambulatory Visit: Payer: Self-pay

## 2012-10-14 DIAGNOSIS — K219 Gastro-esophageal reflux disease without esophagitis: Secondary | ICD-10-CM

## 2012-10-14 MED ORDER — OMEPRAZOLE 40 MG PO CPDR: 40.0000 mg | DELAYED_RELEASE_CAPSULE | Freq: Every day | ORAL | Status: DC

## 2012-10-14 MED ORDER — RANITIDINE HCL 300 MG PO CAPS: 300.0000 mg | ORAL_CAPSULE | Freq: Every day | ORAL | Status: DC

## 2012-12-03 ENCOUNTER — Encounter (INDEPENDENT_AMBULATORY_CARE_PROVIDER_SITE_OTHER): Payer: Self-pay

## 2012-12-03 ENCOUNTER — Ambulatory Visit (INDEPENDENT_AMBULATORY_CARE_PROVIDER_SITE_OTHER): Payer: BC Managed Care – PPO | Admitting: Family Medicine

## 2012-12-03 ENCOUNTER — Encounter: Payer: Self-pay | Admitting: Family Medicine

## 2012-12-03 VITALS — BP 125/75 | HR 62 | Temp 97.3°F | Ht 67.0 in | Wt 133.6 lb

## 2012-12-03 DIAGNOSIS — Z860100 Personal history of colon polyps, unspecified: Secondary | ICD-10-CM

## 2012-12-03 DIAGNOSIS — E785 Hyperlipidemia, unspecified: Secondary | ICD-10-CM

## 2012-12-03 DIAGNOSIS — Z23 Encounter for immunization: Secondary | ICD-10-CM

## 2012-12-03 DIAGNOSIS — K219 Gastro-esophageal reflux disease without esophagitis: Secondary | ICD-10-CM

## 2012-12-03 MED ORDER — PANTOPRAZOLE SODIUM 40 MG PO TBEC: 40.0000 mg | DELAYED_RELEASE_TABLET | Freq: Every day | ORAL | Status: DC

## 2012-12-03 NOTE — Progress Notes (Signed)
Patient ID: Ricky Villanueva, male   DOB: 1951-02-25, 62 y.o.   MRN: [id number] SUBJECTIVE: CC: Chief Complaint  Patient presents with  . Follow-up    3 month ck up   ' HPI:  Patient is here for follow up of hyperlipidemia/GERD: denies Headache;denies Chest Pain;denies weakness;denies Shortness of Breath and orthopnea;denies Visual changes;denies palpitations;denies cough;denies pedal edema;denies symptoms of TIA or stroke;deniesClaudication symptoms. admits to Compliance with medications; denies Problems with medications. Continues to have GERD symptoms. Had EGD consistent with GERD.   Past Medical History  Diagnosis Date  . Hypertension   . GERD (gastroesophageal reflux disease)    Past Surgical History  Procedure Laterality Date  . Hernia repair      Right inguinal   History   Social History  . Marital Status: Single    Spouse Name: N/A    Number of Children: N/A  . Years of Education: N/A   Occupational History  . Not on file.   Social History Main Topics  . Smoking status: Former Smoker    Quit date: 12/26/2011  . Smokeless tobacco: Not on file  . Alcohol Use: No  . Drug Use: No  . Sexual Activity: Not on file   Other Topics Concern  . Not on file   Social History Narrative  . No narrative on file   No family history on file. Current Outpatient Prescriptions on File Prior to Visit  Medication Sig Dispense Refill  . cefdinir (OMNICEF) 300 MG capsule Take 1 capsule (300 mg total) by mouth 2 (two) times daily.  20 capsule  0  . Cholecalciferol (VITAMIN D) 2000 UNITS CAPS Take 1 capsule by mouth daily.      [name] lisinopril (PRINIVIL,ZESTRIL) 10 MG tablet Take 10 mg by mouth daily.       [name] omeprazole (PRILOSEC) 40 MG capsule Take 1 capsule (40 mg total) by mouth daily.  90 capsule  1  . ranitidine (ZANTAC) 300 MG capsule Take 1 capsule (300 mg total) by mouth daily.  90 capsule  1   No current facility-administered medications on file prior to visit.   Allergies   Allergen Reactions  . Augmentin [Amoxicillin-Pot Clavulanate]     There is no immunization history on file for this patient. Prior to Admission medications   Medication Sig Start Date End Date Taking? Authorizing Provider  cefdinir (OMNICEF) 300 MG capsule Take 1 capsule (300 mg total) by mouth 2 (two) times daily. 08/31/12   Ileana Ladd, MD  Cholecalciferol (VITAMIN D) 2000 UNITS CAPS Take 1 capsule by mouth daily.    Historical Provider, MD  lisinopril (PRINIVIL,ZESTRIL) 10 MG tablet Take 10 mg by mouth daily.     Historical Provider, MD  omeprazole (PRILOSEC) 40 MG capsule Take 1 capsule (40 mg total) by mouth daily. 10/14/12   Ernestina Penna, MD  ranitidine (ZANTAC) 300 MG capsule Take 1 capsule (300 mg total) by mouth daily. 10/14/12   Ernestina Penna, MD     ROS: As above in the HPI. All other systems are stable or negative.  OBJECTIVE: APPEARANCE:  Patient in no acute distress.The patient appeared well nourished and normally developed. Acyanotic. Waist: VITAL SIGNS:BP 125/75  Pulse 62  Temp(Src) 97.3 F (36.3 C) (Oral)  Ht 5\' 7"  (1.702 m)  Wt 133 lb 9.6 oz (60.601 kg)  BMI 20.92 kg/m2  WM  SKIN: warm and  Dry without overt rashes, tattoos and scars  HEAD and Neck: without JVD, Head and scalp:  normal Eyes:No scleral icterus. Fundi normal, eye movements normal. Ears: Auricle normal, canal normal, Tympanic membranes normal, insufflation normal. Nose: normal Throat: normal Neck & thyroid: normal  CHEST & LUNGS: Chest wall: normal Lungs: Clear  CVS: Reveals the PMI to be normally located. Regular rhythm, First and Second Heart sounds are normal,  absence of murmurs, rubs or gallops. Peripheral vasculature: Radial pulses: normal Dorsal pedis pulses: normal Posterior pulses: normal  ABDOMEN:  Appearance: normal Benign, no organomegaly, no masses, no Abdominal Aortic enlargement. No Guarding , no rebound. No Bruits. Bowel sounds: normal  RECTAL: N/A GU:  N/A  EXTREMETIES: nonedematous.  MUSCULOSKELETAL:  Spine: normal Joints: intact  NEUROLOGIC: oriented to time,place and person; nonfocal. Strength is normal Sensory is normal Reflexes are normal Cranial Nerves are normal.  ASSESSMENT: GERD (gastroesophageal reflux disease) - Plan: pantoprazole (PROTONIX) 40 MG tablet  HLD (hyperlipidemia)  Personal history of colonic polyps  Need for prophylactic vaccination and inoculation against influenza   PLAN: Need a copy of the labs done this week at [facility].  Meds ordered this encounter  Medications  . pantoprazole (PROTONIX) 40 MG tablet    Sig: Take 1 tablet (40 mg total) by mouth daily.    Dispense:  30 tablet    Refill:  3    Medications Discontinued During This Encounter  Medication Reason  . cefdinir (OMNICEF) 300 MG capsule Completed Course  . omeprazole (PRILOSEC) 40 MG capsule Change in therapy    GERD precautions reinforced. Diet and exercise.  Return in about 6 weeks (around 01/14/2013) for recheck GERD.  Breyanna Valera P. [place] Charon, M.D.

## 2012-12-03 NOTE — Patient Instructions (Signed)
Gastroesophageal Reflux Disease, Adult  Gastroesophageal reflux disease (GERD) happens when acid from your stomach flows up into the esophagus. When acid comes in contact with the esophagus, the acid causes soreness (inflammation) in the esophagus. Over time, GERD may create small holes (ulcers) in the lining of the esophagus.  CAUSES   · Increased body weight. This puts pressure on the stomach, making acid rise from the stomach into the esophagus.  · Smoking. This increases acid production in the stomach.  · Drinking alcohol. This causes decreased pressure in the lower esophageal sphincter (valve or ring of muscle between the esophagus and stomach), allowing acid from the stomach into the esophagus.  · Late evening meals and a full stomach. This increases pressure and acid production in the stomach.  · A malformed lower esophageal sphincter.  Sometimes, no cause is found.  SYMPTOMS   · Burning pain in the lower part of the mid-chest behind the breastbone and in the mid-stomach area. This may occur twice a week or more often.  · Trouble swallowing.  · Sore throat.  · Dry cough.  · Asthma-like symptoms including chest tightness, shortness of breath, or wheezing.  DIAGNOSIS   Your caregiver may be able to diagnose GERD based on your symptoms. In some cases, X-rays and other tests may be done to check for complications or to check the condition of your stomach and esophagus.  TREATMENT   Your caregiver may recommend over-the-counter or prescription medicines to help decrease acid production. Ask your caregiver before starting or adding any new medicines.   HOME CARE INSTRUCTIONS   · Change the factors that you can control. Ask your caregiver for guidance concerning weight loss, quitting smoking, and alcohol consumption.  · Avoid foods and drinks that make your symptoms worse, such as:  · Caffeine or alcoholic drinks.  · Chocolate.  · Peppermint or mint flavorings.  · Garlic and onions.  · Spicy foods.  · Citrus fruits,  such as oranges, lemons, or limes.  · Tomato-based foods such as sauce, chili, salsa, and pizza.  · Fried and fatty foods.  · Avoid lying down for the 3 hours prior to your bedtime or prior to taking a nap.  · Eat small, frequent meals instead of large meals.  · Wear loose-fitting clothing. Do not wear anything tight around your waist that causes pressure on your stomach.  · Raise the head of your bed 6 to 8 inches with wood blocks to help you sleep. Extra pillows will not help.  · Only take over-the-counter or prescription medicines for pain, discomfort, or fever as directed by your caregiver.  · Do not take aspirin, ibuprofen, or other nonsteroidal anti-inflammatory drugs (NSAIDs).  SEEK IMMEDIATE MEDICAL CARE IF:   · You have pain in your arms, neck, jaw, teeth, or back.  · Your pain increases or changes in intensity or duration.  · You develop nausea, vomiting, or sweating (diaphoresis).  · You develop shortness of breath, or you faint.  · Your vomit is green, yellow, black, or looks like coffee grounds or blood.  · Your stool is red, bloody, or black.  These symptoms could be signs of other problems, such as heart disease, gastric bleeding, or esophageal bleeding.  MAKE SURE YOU:   · Understand these instructions.  · Will watch your condition.  · Will get help right away if you are not doing well or get worse.  Document Released: 11/20/2004 Document Revised: 05/05/2011 Document Reviewed: 08/30/2010  ExitCare® Patient   Information ©2014 [employer].

## 2013-01-31 ENCOUNTER — Encounter: Payer: Self-pay | Admitting: Family Medicine

## 2013-01-31 ENCOUNTER — Ambulatory Visit (INDEPENDENT_AMBULATORY_CARE_PROVIDER_SITE_OTHER): Payer: BC Managed Care – PPO | Admitting: Family Medicine

## 2013-01-31 VITALS — BP 133/78 | HR 68 | Temp 97.6°F | Ht 67.0 in | Wt 135.2 lb

## 2013-01-31 DIAGNOSIS — I10 Essential (primary) hypertension: Secondary | ICD-10-CM | POA: Insufficient documentation

## 2013-01-31 DIAGNOSIS — E785 Hyperlipidemia, unspecified: Secondary | ICD-10-CM | POA: Insufficient documentation

## 2013-01-31 DIAGNOSIS — Z23 Encounter for immunization: Secondary | ICD-10-CM

## 2013-01-31 DIAGNOSIS — K219 Gastro-esophageal reflux disease without esophagitis: Secondary | ICD-10-CM

## 2013-01-31 NOTE — Patient Instructions (Signed)
Tetanus, Diphtheria (Td) Vaccine What You Need to Know WHY GET VACCINATED? Tetanus  and diphtheria are very serious diseases. They are rare in the [place] today, but people who do become infected often have severe complications. Td vaccine is used to protect adolescents and adults from both of these diseases. Both tetanus and diphtheria are infections caused by bacteria. Diphtheria spreads from person to person through coughing or sneezing. Tetanus-causing bacteria enter the body through cuts, scratches, or wounds. TETANUS (Lockjaw) causes painful muscle tightening and stiffness, usually all over the body.  It can lead to tightening of muscles in the head and neck so you can't open your mouth, swallow, or sometimes even breathe. Tetanus kills about 1 out of every 5 people who are infected. DIPHTHERIA can cause a thick coating to form in the back of the throat.  It can lead to breathing problems, paralysis, heart failure, and death. Before vaccines, the [place] saw as many as 200,000 cases a year of diphtheria and hundreds of cases of tetanus. Since vaccination began, cases of both diseases have dropped by about 99%. TD VACCINE Td vaccine can protect adolescents and adults from tetanus and diphtheria. Td is usually given as a booster dose every 10 years but it can also be given earlier after a severe and dirty wound or burn. Your doctor can give you more information. Td may safely be given at the same time as other vaccines. SOME PEOPLE SHOULD NOT GET THIS VACCINE  If you ever had a life-threatening allergic reaction after a dose of any tetanus or diphtheria containing vaccine, OR if you have a severe allergy to any part of this vaccine, you should not get Td. Tell your doctor if you have any severe allergies.  Talk to your doctor if you:  have epilepsy or another nervous system problem,  had severe pain or swelling after any vaccine containing diphtheria or tetanus,  ever had  Guillain Barr Syndrome (GBS),  aren't feeling well on the day the shot is scheduled. RISKS OF A VACCINE REACTION With a vaccine, like any medicine, there is a chance of side effects. These are usually mild and go away on their own. Serious side effects are also possible, but are very rare. Most people who get Td vaccine do not have any problems with it. Mild Problems  following Td (Did not interfere with activities)  Pain where the shot was given (about 8 people in 10)  Redness or swelling where the shot was given (about 1 person in 3)  Mild fever (about 1 person in 15)  Headache or Tiredness (uncommon) Moderate Problems following Td (Interfered with activities, but did not require medical attention)  Fever over 102 F (38.9 C) (rare) Severe Problems  following Td (Unable to perform usual activities; required medical attention)  Swelling, severe pain, bleeding, or redness in the arm where the shot was given (rare). Problems that could happen after any vaccine:  Brief fainting spells can happen after any medical procedure, including vaccination. Sitting or lying down for about 15 minutes can help prevent fainting, and injuries caused by a fall. Tell your doctor if you feel dizzy, or have vision changes or ringing in the ears.  Severe shoulder pain and reduced range of motion in the arm where a shot was given can happen, very rarely, after a vaccination.  Severe allergic reactions from a vaccine are very rare, estimated at less than 1 in a million doses. If one were to occur, it would   usually be within a few minutes to a few hours after the vaccination. WHAT IF THERE IS A SERIOUS REACTION? What should I look for?  Look for anything that concerns you, such as signs of a severe allergic reaction, very high fever, or behavior changes. Signs of a severe allergic reaction can include hives, swelling of the face and throat, difficulty breathing, a fast heartbeat, dizziness, and  weakness. These would usually start a few minutes to a few hours after the vaccination. What should I do?  If you think it is a severe allergic reaction or other emergency that can't wait, call 911 or get the person to the nearest hospital. Otherwise, call your doctor.  Afterward, the reaction should be reported to the Vaccine Adverse Event Reporting System (VAERS). Your doctor might file this report, or, you can do it yourself through the VAERS website or by calling 1-800-822-7967. VAERS is only for reporting reactions. They do not give medical advice. THE NATIONAL VACCINE INJURY COMPENSATION PROGRAM The National Vaccine Injury Compensation Program (VICP) is a federal program that was created to compensate people who may have been injured by certain vaccines. Persons who believe they may have been injured by a vaccine can learn about the program and about filing a claim by calling 1-800-338-2382 or visiting the VICP website. HOW CAN I LEARN MORE?  Ask your doctor.  Contact your local or state health department.  Contact the Centers for Disease Control and Prevention (CDC):  Call 1-800-232-4636 (1-800-CDC-INFO)  Visit CDC's vaccines website CDC Td Vaccine Interim VIS (03/30/12) Document Released: 12/08/2005 Document Revised: 06/07/2012 Document Reviewed: 06/02/2012 ExitCare Patient Information 2014 [employer].  

## 2013-01-31 NOTE — Progress Notes (Signed)
Tolerated tdap without difficulty 

## 2013-01-31 NOTE — Progress Notes (Signed)
Patient ID: Ricky Villanueva, male   DOB: 1951-01-17, 62 y.o.   MRN: [id number] SUBJECTIVE: CC: Chief Complaint  Patient presents with  . Follow-up    6 week reck    HPI:  Feels great doing well on present regimen.  Patient is here for follow up of hyperlipidemia/HTN/GERD: denies Headache;denies Chest Pain;denies weakness;denies Shortness of Breath and orthopnea;denies Visual changes;denies palpitations;denies cough;denies pedal edema;denies symptoms of TIA or stroke;deniesClaudication symptoms. admits to Compliance with medications; denies Problems with medications.    Past Medical History  Diagnosis Date  . Hyperlipidemia   . Hypertension   . GERD (gastroesophageal reflux disease)    Past Surgical History  Procedure Laterality Date  . Hernia repair      Right inguinal   History   Social History  . Marital Status: Single    Spouse Name: N/A    Number of Children: N/A  . Years of Education: N/A   Occupational History  . Not on file.   Social History Main Topics  . Smoking status: Former Smoker    Quit date: 12/26/2011  . Smokeless tobacco: Not on file  . Alcohol Use: No  . Drug Use: No  . Sexual Activity: Not on file   Other Topics Concern  . Not on file   Social History Narrative  . No narrative on file   No family history on file. Current Outpatient Prescriptions on File Prior to Visit  Medication Sig Dispense Refill  . Cholecalciferol (VITAMIN D) 2000 UNITS CAPS Take 1 capsule by mouth daily.      [name] lisinopril (PRINIVIL,ZESTRIL) 10 MG tablet Take 10 mg by mouth daily.       . pantoprazole (PROTONIX) 40 MG tablet Take 1 tablet (40 mg total) by mouth daily.  30 tablet  3  . ranitidine (ZANTAC) 300 MG capsule Take 1 capsule (300 mg total) by mouth daily.  90 capsule  1   No current facility-administered medications on file prior to visit.   Allergies  Allergen Reactions  . Augmentin [Amoxicillin-Pot Clavulanate]    Immunization History  Administered  Date(s) Administered  . Influenza,inj,Quad PF,36+ Mos 12/03/2012  . Tdap 01/31/2013   Prior to Admission medications   Medication Sig Start Date End Date Taking? Authorizing Provider  Cholecalciferol (VITAMIN D) 2000 UNITS CAPS Take 1 capsule by mouth daily.   Yes Historical Provider, MD  lisinopril (PRINIVIL,ZESTRIL) 10 MG tablet Take 10 mg by mouth daily.    Yes Historical Provider, MD  pantoprazole (PROTONIX) 40 MG tablet Take 1 tablet (40 mg total) by mouth daily. 12/03/12  Yes Ileana Ladd, MD  ranitidine (ZANTAC) 300 MG capsule Take 1 capsule (300 mg total) by mouth daily. 10/14/12  Yes Ernestina Penna, MD     ROS: As above in the HPI. All other systems are stable or negative.  OBJECTIVE: APPEARANCE:  Patient in no acute distress.The patient appeared well nourished and normally developed. Acyanotic. Waist: VITAL SIGNS:BP 133/78  Pulse 68  Temp(Src) 97.6 F (36.4 C) (Oral)  Ht 5\' 7"  (1.702 m)  Wt 135 lb 3.2 oz (61.326 kg)  BMI 21.17 kg/m2  WM  SKIN: warm and  Dry without overt rashes, tattoos and scars  HEAD and Neck: without JVD, Head and scalp: normal Eyes:No scleral icterus. Fundi normal, eye movements normal. Ears: Auricle normal, canal normal, Tympanic membranes normal, insufflation normal. Nose: normal Throat: normal Neck & thyroid: normal  CHEST & LUNGS: Chest wall: normal Lungs: Clear  CVS: Reveals the  PMI to be normally located. Regular rhythm, First and Second Heart sounds are normal,  absence of murmurs, rubs or gallops. Peripheral vasculature: Radial pulses: normal Dorsal pedis pulses: normal Posterior pulses: normal  ABDOMEN:  Appearance: normal Benign, no organomegaly, no masses, no Abdominal Aortic enlargement. No Guarding , no rebound. No Bruits. Bowel sounds: normal  RECTAL: N/A GU: N/A  EXTREMETIES: nonedematous.  MUSCULOSKELETAL:  Spine: normal Joints: intact  NEUROLOGIC: oriented to time,place and person;  nonfocal. Strength is normal Sensory is normal Reflexes are normal Cranial Nerves are normal.  Results for orders placed in visit on 05/31/12  HELICOBACTER PYLORI  SPECIAL ANTIGEN      Result Value Range   H. PYLORI Antigen  NEGATIVE     H. PYLORI Antigen  Antimicrobials,proton pump inhibitors and bismuth     H. PYLORI Antigen        Value: preparations are known to suppress H.pylori and ingestion of   H. PYLORI Antigen        Value: these prior to testing may cause a false negative result. If   H. PYLORI Antigen        Value: a negative result is obtained for a patient that has    H. PYLORI Antigen  ingested these compounds within two weeks prior of     H. PYLORI Antigen        Value: performing the H.pylori test, results may be falsely   H. PYLORI Antigen        Value: negative and should be repeated with a new specimen two   H. PYLORI Antigen  weeks after discontinuing treatment.      ASSESSMENT:  GERD (gastroesophageal reflux disease)  HLD (hyperlipidemia)  Hypertension  Need for Tdap vaccination - Plan: Tdap vaccine greater than or equal to 7yo IM  PLAN:  Continue present regimen.  Healthy diet and balanced lifestyle.  Orders Placed This Encounter  Procedures  . Tdap vaccine greater than or equal to 7yo IM   No orders of the defined types were placed in this encounter.   There are no discontinued medications. Return in about 6 months (around 08/01/2013) for Recheck medical problems.  Jeston Junkins P. [place] Charon, M.D.

## 2013-02-01 ENCOUNTER — Ambulatory Visit: Payer: BC Managed Care – PPO | Admitting: Family Medicine

## 2013-04-07 ENCOUNTER — Telehealth: Payer: Self-pay | Admitting: Family Medicine

## 2013-04-07 DIAGNOSIS — J189 Pneumonia, unspecified organism: Secondary | ICD-10-CM

## 2013-04-07 DIAGNOSIS — K219 Gastro-esophageal reflux disease without esophagitis: Secondary | ICD-10-CM

## 2013-04-07 HISTORY — DX: Pneumonia, unspecified organism: J18.9

## 2013-04-08 MED ORDER — RANITIDINE HCL 300 MG PO CAPS: 300.0000 mg | ORAL_CAPSULE | Freq: Every day | ORAL | Status: DC

## 2013-04-08 MED ORDER — PANTOPRAZOLE SODIUM 40 MG PO TBEC: 40.0000 mg | DELAYED_RELEASE_TABLET | Freq: Every day | ORAL | Status: DC

## 2013-04-08 NOTE — Telephone Encounter (Signed)
done

## 2013-04-13 ENCOUNTER — Telehealth: Payer: Self-pay | Admitting: Family Medicine

## 2013-04-13 NOTE — Telephone Encounter (Signed)
appt made for chest congestion. Refused appt for today to be seen

## 2013-04-14 ENCOUNTER — Ambulatory Visit (INDEPENDENT_AMBULATORY_CARE_PROVIDER_SITE_OTHER): Payer: BC Managed Care – PPO | Admitting: Family Medicine

## 2013-04-14 ENCOUNTER — Encounter: Payer: Self-pay | Admitting: Family Medicine

## 2013-04-14 ENCOUNTER — Ambulatory Visit (INDEPENDENT_AMBULATORY_CARE_PROVIDER_SITE_OTHER): Payer: BC Managed Care – PPO

## 2013-04-14 VITALS — BP 126/75 | HR 76 | Temp 97.2°F | Ht 67.0 in | Wt 131.8 lb

## 2013-04-14 DIAGNOSIS — R05 Cough: Secondary | ICD-10-CM

## 2013-04-14 DIAGNOSIS — J189 Pneumonia, unspecified organism: Secondary | ICD-10-CM

## 2013-04-14 DIAGNOSIS — R059 Cough, unspecified: Secondary | ICD-10-CM

## 2013-04-14 MED ORDER — CLARITHROMYCIN 500 MG PO TABS: 500.0000 mg | ORAL_TABLET | Freq: Two times a day (BID) | ORAL | Status: DC

## 2013-04-14 MED ORDER — CEFTRIAXONE SODIUM 1 G IJ SOLR
1.0000 g | Freq: Once | INTRAMUSCULAR | Status: AC
  Administered 2013-04-14: 1 g via INTRAMUSCULAR

## 2013-04-14 MED ORDER — GUAIFENESIN-CODEINE 100-10 MG/5ML PO SYRP: 5.0000 mL | ORAL_SOLUTION | Freq: Three times a day (TID) | ORAL | Status: DC | PRN

## 2013-04-14 NOTE — Patient Instructions (Addendum)
Pneumonia, Adult °Pneumonia is an infection of the lungs.  °CAUSES °Pneumonia may be caused by bacteria or a virus. Usually, these infections are caused by breathing infectious particles into the lungs (respiratory tract). °SYMPTOMS  °· Cough. °· Fever. °· Chest pain. °· Increased rate of breathing. °· Wheezing. °· Mucus production. °DIAGNOSIS  °If you have the common symptoms of pneumonia, your caregiver will typically confirm the diagnosis with a chest X-ray. The X-ray will show an abnormality in the lung (pulmonary infiltrate) if you have pneumonia. Other tests of your blood, urine, or sputum may be done to find the specific cause of your pneumonia. Your caregiver may also do tests (blood gases or pulse oximetry) to see how well your lungs are working. °TREATMENT  °Some forms of pneumonia may be spread to other people when you cough or sneeze. You may be asked to wear a mask before and during your exam. Pneumonia that is caused by bacteria is treated with antibiotic medicine. Pneumonia that is caused by the influenza virus may be treated with an antiviral medicine. Most other viral infections must run their course. These infections will not respond to antibiotics.  °PREVENTION °A pneumococcal shot (vaccine) is available to prevent a common bacterial cause of pneumonia. This is usually suggested for: °· People over 65 years old. °· Patients on chemotherapy. °· People with chronic lung problems, such as bronchitis or emphysema. °· People with immune system problems. °If you are over 65 or have a high risk condition, you may receive the pneumococcal vaccine if you have not received it before. In some countries, a routine influenza vaccine is also recommended. This vaccine can help prevent some cases of pneumonia. You may be offered the influenza vaccine as part of your care. °If you smoke, it is time to quit. You may receive instructions on how to stop smoking. Your caregiver can provide medicines and counseling to  help you quit. °HOME CARE INSTRUCTIONS  °· Cough suppressants may be used if you are losing too much rest. However, coughing protects you by clearing your lungs. You should avoid using cough suppressants if you can. °· Your caregiver may have prescribed medicine if he or she thinks your pneumonia is caused by a bacteria or influenza. Finish your medicine even if you start to feel better. °· Your caregiver may also prescribe an expectorant. This loosens the mucus to be coughed up. °· Only take over-the-counter or prescription medicines for pain, discomfort, or fever as directed by your caregiver. °· Do not smoke. Smoking is a common cause of bronchitis and can contribute to pneumonia. If you are a smoker and continue to smoke, your cough may last several weeks after your pneumonia has cleared. °· A cold steam vaporizer or humidifier in your room or home may help loosen mucus. °· Coughing is often worse at night. Sleeping in a semi-upright position in a recliner or using a couple pillows under your head will help with this. °· Get rest as you feel it is needed. Your body will usually let you know when you need to rest. °SEEK IMMEDIATE MEDICAL CARE IF:  °· Your illness becomes worse. This is especially true if you are elderly or weakened from any other disease. °· You cannot control your cough with suppressants and are losing sleep. °· You begin coughing up blood. °· You develop pain which is getting worse or is uncontrolled with medicines. °· You have a fever. °· Any of the symptoms which initially brought you in for treatment   are getting worse rather than better.  You develop shortness of breath or chest pain. MAKE SURE YOU:   Understand these instructions.  Will watch your condition.  Will get help right away if you are not doing well or get worse. Document Released: 02/10/2005 Document Revised: 05/05/2011 Document Reviewed: 05/02/2010 [facility] Patient Information 2014 [place], [place]. Ceftriaxone  injection What is this medicine? CEFTRIAXONE (sef try AX one) is a cephalosporin antibiotic. It is used to treat certain kinds of bacterial infections. It will not work for colds, flu, or other viral infections. This medicine may be used for other purposes; ask your health care provider or pharmacist if you have questions. COMMON BRAND NAME(S): Rocephin What should I tell my health care provider before I take this medicine? They need to know if you have any of these conditions: -any chronic illness -bowel disease, like colitis -both kidney and liver disease -high bilirubin level in newborn patients -an unusual or allergic reaction to ceftriaxone, other cephalosporin or penicillin antibiotics, foods, dyes or preservatives -pregnant or trying to get pregnant -breast-feeding How should I use this medicine? This medicine is injected into a muscle or infused it into a vein. It is usually given in a medical office or clinic. If you are to give this medicine you will be taught how to inject it. Follow instructions carefully. Use your doses at regular intervals. Do not take your medicine more often than directed. Do not skip doses or stop your medicine early even if you feel better. Do not stop taking except on your doctor's advice. Talk to your pediatrician regarding the use of this medicine in children. Special care may be needed. Overdosage: If you think you have taken too much of this medicine contact a poison control center or emergency room at once. NOTE: This medicine is only for you. Do not share this medicine with others. What if I miss a dose? If you miss a dose, take it as soon as you can. If it is almost time for your next dose, take only that dose. Do not take double or extra doses. What may interact with this medicine? Do not take this medicine with any of the following medications: -intravenous calcium This list may not describe all possible interactions. Give your health care provider  a list of all the medicines, herbs, non-prescription drugs, or dietary supplements you use. Also tell them if you smoke, drink alcohol, or use illegal drugs. Some items may interact with your medicine. What should I watch for while using this medicine? Tell your doctor or health care professional if your symptoms do not improve or if they get worse. Do not treat diarrhea with over the counter products. Contact your doctor if you have diarrhea that lasts more than 2 days or if it is severe and watery. If you are being treated for a sexually transmitted disease, avoid sexual contact until you have finished your treatment. Having sex can infect your sexual partner. Calcium may bind to this medicine and cause lung or kidney problems. Avoid calcium products while taking this medicine and for 48 hours after taking the last dose of this medicine. What side effects may I notice from receiving this medicine? Side effects that you should report to your doctor or health care professional as soon as possible: -allergic reactions like skin rash, itching or hives, swelling of the face, lips, or tongue -breathing problems -fever, chills -irregular heartbeat -pain when passing urine -seizures -stomach pain, cramps -unusual bleeding, bruising -unusually weak or  tired Side effects that usually do not require medical attention (report to your doctor or health care professional if they continue or are bothersome): -diarrhea -dizzy, drowsy -headache -nausea, vomiting -pain, swelling, irritation where injected -stomach upset -sweating This list may not describe all possible side effects. Call your doctor for medical advice about side effects. You may report side effects to FDA at 1-800-FDA-1088. Where should I keep my medicine? Keep out of the reach of children. Store at room temperature below 25 degrees C (77 degrees F). Protect from light. Throw away any unused vials after the expiration date. NOTE: This  sheet is a summary. It may not cover all possible information. If you have questions about this medicine, talk to your doctor, pharmacist, or health care provider.  2014, [employer]vier/[employer] Standard. (2012-02-23 15:34:57)

## 2013-04-14 NOTE — Progress Notes (Signed)
Patient ID: Ricky Villanueva, male   DOB: February 14, 1951, 63 y.o.   MRN: [id number] SUBJECTIVE: CC: Chief Complaint  Patient presents with  . Acute Visit    chest congestion  cough    HPI: 2 weeks ago sinus drainage. Took mucinex. Developed a cough. Had a fever and it stopped. Has ahd a hacky cough since. Past Medical History  Diagnosis Date  . Hyperlipidemia   . Hypertension   . GERD (gastroesophageal reflux disease)    Past Surgical History  Procedure Laterality Date  . Hernia repair      Right inguinal   History   Social History  . Marital Status: Single    Spouse Name: N/A    Number of Children: N/A  . Years of Education: N/A   Occupational History  . Not on file.   Social History Main Topics  . Smoking status: Former Smoker    Quit date: 12/26/2011  . Smokeless tobacco: Not on file  . Alcohol Use: No  . Drug Use: No  . Sexual Activity: Not on file   Other Topics Concern  . Not on file   Social History Narrative  . No narrative on file   No family history on file. Current Outpatient Prescriptions on File Prior to Visit  Medication Sig Dispense Refill  . Cholecalciferol (VITAMIN D) 2000 UNITS CAPS Take 1 capsule by mouth daily.      [name] lisinopril (PRINIVIL,ZESTRIL) 10 MG tablet Take 10 mg by mouth daily.       . pantoprazole (PROTONIX) 40 MG tablet Take 1 tablet (40 mg total) by mouth daily.  90 tablet  0  . ranitidine (ZANTAC) 300 MG capsule Take 1 capsule (300 mg total) by mouth daily.  90 capsule  0   No current facility-administered medications on file prior to visit.   Allergies  Allergen Reactions  . Augmentin [Amoxicillin-Pot Clavulanate]    Immunization History  Administered Date(s) Administered  . Influenza,inj,Quad PF,36+ Mos 12/03/2012  . Tdap 01/31/2013   Prior to Admission medications   Medication Sig Start Date End Date Taking? Authorizing Provider  Cholecalciferol (VITAMIN D) 2000 UNITS CAPS Take 1 capsule by mouth daily.    Historical  Provider, MD  lisinopril (PRINIVIL,ZESTRIL) 10 MG tablet Take 10 mg by mouth daily.     Historical Provider, MD  pantoprazole (PROTONIX) 40 MG tablet Take 1 tablet (40 mg total) by mouth daily. 04/08/13   Vernie Shanks, MD  ranitidine (ZANTAC) 300 MG capsule Take 1 capsule (300 mg total) by mouth daily. 04/08/13   Vernie Shanks, MD     ROS: As above in the HPI. All other systems are stable or negative.  OBJECTIVE: APPEARANCE:  Patient in no acute distress.The patient appeared well nourished and normally developed. Acyanotic. Waist: VITAL SIGNS:BP 126/75  Pulse 76  Temp(Src) 97.2 F (36.2 C) (Oral)  Ht 5\' 7"  (1.702 m)  Wt 131 lb 12.8 oz (59.784 kg)  BMI 20.64 kg/m2  SpO2 96%   SKIN: warm and  Dry without overt rashes, tattoos and scars  HEAD and Neck: without JVD, Head and scalp: normal Eyes:No scleral icterus. Fundi normal, eye movements normal. Ears: Auricle normal, canal normal, Tympanic membranes normal, insufflation normal. Nose: normal Throat: normal Neck & thyroid: normal  CHEST & LUNGS: Chest wall: normal Lungs: Coarse breath sounds. Rales RLL.  CVS: Reveals the PMI to be normally located. Regular rhythm, First and Second Heart sounds are normal,  absence of murmurs, rubs or gallops.  Peripheral vasculature: Radial pulses: normal Dorsal pedis pulses: normal Posterior pulses: normal  ABDOMEN:  Appearance: normal Benign, no organomegaly, no masses, no Abdominal Aortic enlargement. No Guarding , no rebound. No Bruits. Bowel sounds: normal  RECTAL: N/A GU: N/A  EXTREMETIES: nonedematous.  MUSCULOSKELETAL:  Spine: normal Joints: intact  NEUROLOGIC: oriented to time,place and person; nonfocal. Strength is normal Sensory is normal Reflexes are normal Cranial Nerves are normal.  ASSESSMENT: Cough - Plan: DG Chest 2 View, cefTRIAXone (ROCEPHIN) injection 1 g  CAP (community acquired pneumonia) - Plan: clarithromycin (BIAXIN) 500 MG tablet,  guaiFENesin-codeine (CHERATUSSIN AC) 100-10 MG/5ML syrup, cefTRIAXone (ROCEPHIN) injection 1 g  PLAN:  Orders Placed This Encounter  Procedures  . DG Chest 2 View    Standing Status: Future     Number of Occurrences: 1     Standing Expiration Date: 06/14/2014    Order Specific Question:  Reason for Exam (SYMPTOM  OR DIAGNOSIS REQUIRED)    Answer:  cough    Order Specific Question:  Preferred imaging location?    Answer:  Internal  [facility] reading (PRIMARY) by  Dr. Jacelyn Grip: RLL infiltrate.  Discussed findings with patient.                                 Meds ordered this encounter  Medications  . clarithromycin (BIAXIN) 500 MG tablet    Sig: Take 1 tablet (500 mg total) by mouth 2 (two) times daily.    Dispense:  20 tablet    Refill:  0  . guaiFENesin-codeine (CHERATUSSIN AC) 100-10 MG/5ML syrup    Sig: Take 5 mLs by mouth 3 (three) times daily as needed for cough.    Dispense:  120 mL    Refill:  0  . cefTRIAXone (ROCEPHIN) injection 1 g    Sig:    There are no discontinued medications. Return in about 4 days (around 04/18/2013) for recheck pneumonia.  Kamalei Roeder P. Jacelyn Grip, M.D.

## 2013-04-14 NOTE — Progress Notes (Signed)
Tolerated rocephin injection without difficulty

## 2013-04-18 ENCOUNTER — Encounter: Payer: Self-pay | Admitting: Family Medicine

## 2013-04-18 ENCOUNTER — Ambulatory Visit (INDEPENDENT_AMBULATORY_CARE_PROVIDER_SITE_OTHER): Payer: BC Managed Care – PPO | Admitting: Family Medicine

## 2013-04-18 VITALS — BP 93/62 | HR 67 | Temp 97.1°F | Ht 67.0 in | Wt 131.6 lb

## 2013-04-18 DIAGNOSIS — J189 Pneumonia, unspecified organism: Secondary | ICD-10-CM

## 2013-04-18 MED ORDER — CEFDINIR 300 MG PO CAPS: 300.0000 mg | ORAL_CAPSULE | Freq: Two times a day (BID) | ORAL | Status: DC

## 2013-04-18 NOTE — Progress Notes (Signed)
Patient ID: Ricky Villanueva, male   DOB: Sep 10, 1950, 63 y.o.   MRN: [id number] SUBJECTIVE: CC: Chief Complaint  Patient presents with  . Follow-up    reck pneumonia  doing better     HPI: Here for follow up. A little better. No fever. No SOB. Has occasional cough still.  Past Medical History  Diagnosis Date  . Hyperlipidemia   . Hypertension   . GERD (gastroesophageal reflux disease)    Past Surgical History  Procedure Laterality Date  . Hernia repair      Right inguinal   History   Social History  . Marital Status: Single    Spouse Name: N/A    Number of Children: N/A  . Years of Education: N/A   Occupational History  . Not on file.   Social History Main Topics  . Smoking status: Former Smoker    Quit date: 12/26/2011  . Smokeless tobacco: Not on file  . Alcohol Use: No  . Drug Use: No  . Sexual Activity: Not on file   Other Topics Concern  . Not on file   Social History Narrative  . No narrative on file   No family history on file. Current Outpatient Prescriptions on File Prior to Visit  Medication Sig Dispense Refill  . Cholecalciferol (VITAMIN D) 2000 UNITS CAPS Take 1 capsule by mouth daily.      . clarithromycin (BIAXIN) 500 MG tablet Take 1 tablet (500 mg total) by mouth 2 (two) times daily.  20 tablet  0  . guaiFENesin-codeine (CHERATUSSIN AC) 100-10 MG/5ML syrup Take 5 mLs by mouth 3 (three) times daily as needed for cough.  120 mL  0  . lisinopril (PRINIVIL,ZESTRIL) 10 MG tablet Take 10 mg by mouth daily.       . pantoprazole (PROTONIX) 40 MG tablet Take 1 tablet (40 mg total) by mouth daily.  90 tablet  0  . ranitidine (ZANTAC) 300 MG capsule Take 1 capsule (300 mg total) by mouth daily.  90 capsule  0   No current facility-administered medications on file prior to visit.   Allergies  Allergen Reactions  . Augmentin [Amoxicillin-Pot Clavulanate]    Immunization History  Administered Date(s) Administered  . Influenza,inj,Quad PF,36+ Mos  12/03/2012  . Tdap 01/31/2013   Prior to Admission medications   Medication Sig Start Date End Date Taking? Authorizing Provider  Cholecalciferol (VITAMIN D) 2000 UNITS CAPS Take 1 capsule by mouth daily.   Yes Historical Provider, MD  clarithromycin (BIAXIN) 500 MG tablet Take 1 tablet (500 mg total) by mouth 2 (two) times daily. 04/14/13  Yes Vernie Shanks, MD  guaiFENesin-codeine [facility]) 100-10 MG/5ML syrup Take 5 mLs by mouth 3 (three) times daily as needed for cough. 04/14/13  Yes Vernie Shanks, MD  lisinopril (PRINIVIL,ZESTRIL) 10 MG tablet Take 10 mg by mouth daily.    Yes Historical Provider, MD  pantoprazole (PROTONIX) 40 MG tablet Take 1 tablet (40 mg total) by mouth daily. 04/08/13   Vernie Shanks, MD  ranitidine (ZANTAC) 300 MG capsule Take 1 capsule (300 mg total) by mouth daily. 04/08/13   Vernie Shanks, MD     ROS: As above in the HPI. All other systems are stable or negative.  OBJECTIVE: APPEARANCE:  Patient in no acute distress.The patient appeared well nourished and normally developed. Acyanotic. Waist: VITAL SIGNS:BP 93/62  Pulse 67  Temp(Src) 97.1 F (36.2 C) (Oral)  Ht 5\' 7"  (1.702 m)  Wt 131 lb 9.6  oz (59.693 kg)  BMI 20.61 kg/m2  SpO2 96% WM  SKIN: warm and  Dry without overt rashes, tattoos and scars  HEAD and Neck: without JVD, Head and scalp: normal Eyes:No scleral icterus. Fundi normal, eye movements normal. Ears: Auricle normal, canal normal, Tympanic membranes normal, insufflation normal. Nose: normal Throat: normal Neck & thyroid: normal  CHEST & LUNGS: Chest wall: normal Lungs: Coarse bs and rales RLL  CVS: Reveals the PMI to be normally located. Regular rhythm, First and Second Heart sounds are normal,  absence of murmurs, rubs or gallops. Peripheral vasculature: Radial pulses: normal Dorsal pedis pulses: normal Posterior pulses: normal  ABDOMEN:  Appearance: normal Benign, no organomegaly, no masses, no Abdominal  Aortic enlargement. No Guarding , no rebound. No Bruits. Bowel sounds: normal  RECTAL: N/A GU: N/A  EXTREMETIES: nonedematous.  MUSCULOSKELETAL:  Spine: normal Joints: intact  NEUROLOGIC: oriented to time,place and person; nonfocal. Strength is normal Sensory is normal Reflexes are normal Cranial Nerves are normal.   CLINICAL DATA: Cough  EXAM:  CHEST 2 VIEW  COMPARISON: 02/05/2004  FINDINGS:  There is dense consolidation in the right lower lobe consistent with  pneumonia. Small area of mild consolidation is noted at the base of  the right middle lobe.  Lungs are hyperexpanded but otherwise clear. No pleural effusion or  pneumothorax.  Cardiac silhouette is normal in size. There is fullness of the right  hilum suggesting reactive adenopathy.  IMPRESSION:  1. Right lower lobe pneumonia with a small focus of right middle  lobe infiltrate. Fullness of the right hilum suggests reactive right  hilar adenopathy.  Electronically Signed  By: Lajean Manes M.D.  ASSESSMENT: Pneumonia - atypical CAP.  PLAN: Reviewed results. Continue biaxin and will add omnicef due to slow response. No orders of the defined types were placed in this encounter.   Meds ordered this encounter  Medications  . cefdinir (OMNICEF) 300 MG capsule    Sig: Take 1 capsule (300 mg total) by mouth 2 (two) times daily.    Dispense:  20 capsule    Refill:  0   There are no discontinued medications. Return in about 3 days (around 04/21/2013) for Recheck medical problems.  Raif Chachere P. Jacelyn Grip, M.D.

## 2013-04-21 ENCOUNTER — Emergency Department (HOSPITAL_COMMUNITY): Payer: BC Managed Care – PPO

## 2013-04-21 ENCOUNTER — Encounter (HOSPITAL_COMMUNITY): Payer: Self-pay | Admitting: Emergency Medicine

## 2013-04-21 ENCOUNTER — Inpatient Hospital Stay (HOSPITAL_COMMUNITY): Payer: BC Managed Care – PPO

## 2013-04-21 ENCOUNTER — Ambulatory Visit: Payer: BC Managed Care – PPO | Admitting: Family Medicine

## 2013-04-21 ENCOUNTER — Inpatient Hospital Stay (HOSPITAL_COMMUNITY)
Admission: EM | Admit: 2013-04-21 | Discharge: 2013-04-29 | DRG: 166 | Disposition: A | Discharge status: Home / Self Care | Payer: BC Managed Care – PPO | Attending: Internal Medicine | Admitting: Internal Medicine

## 2013-04-21 DIAGNOSIS — R040 Epistaxis: Secondary | ICD-10-CM | POA: Diagnosis present

## 2013-04-21 DIAGNOSIS — J9691 Respiratory failure, unspecified with hypoxia: Secondary | ICD-10-CM

## 2013-04-21 DIAGNOSIS — J9600 Acute respiratory failure, unspecified whether with hypoxia or hypercapnia: Principal | ICD-10-CM | POA: Diagnosis present

## 2013-04-21 DIAGNOSIS — Z79899 Other long term (current) drug therapy: Secondary | ICD-10-CM

## 2013-04-21 DIAGNOSIS — C3490 Malignant neoplasm of unspecified part of unspecified bronchus or lung: Secondary | ICD-10-CM | POA: Diagnosis present

## 2013-04-21 DIAGNOSIS — Z8371 Family history of colonic polyps: Secondary | ICD-10-CM

## 2013-04-21 DIAGNOSIS — R918 Other nonspecific abnormal finding of lung field: Secondary | ICD-10-CM

## 2013-04-21 DIAGNOSIS — Z83719 Family history of colon polyps, unspecified: Secondary | ICD-10-CM

## 2013-04-21 DIAGNOSIS — C3491 Malignant neoplasm of unspecified part of right bronchus or lung: Secondary | ICD-10-CM

## 2013-04-21 DIAGNOSIS — Q211 Atrial septal defect: Secondary | ICD-10-CM

## 2013-04-21 DIAGNOSIS — Z803 Family history of malignant neoplasm of breast: Secondary | ICD-10-CM

## 2013-04-21 DIAGNOSIS — K219 Gastro-esophageal reflux disease without esophagitis: Secondary | ICD-10-CM | POA: Diagnosis present

## 2013-04-21 DIAGNOSIS — I10 Essential (primary) hypertension: Secondary | ICD-10-CM | POA: Diagnosis present

## 2013-04-21 DIAGNOSIS — Z881 Allergy status to other antibiotic agents status: Secondary | ICD-10-CM

## 2013-04-21 DIAGNOSIS — J189 Pneumonia, unspecified organism: Secondary | ICD-10-CM | POA: Diagnosis present

## 2013-04-21 DIAGNOSIS — J438 Other emphysema: Secondary | ICD-10-CM | POA: Diagnosis present

## 2013-04-21 DIAGNOSIS — M1990 Unspecified osteoarthritis, unspecified site: Secondary | ICD-10-CM | POA: Diagnosis present

## 2013-04-21 DIAGNOSIS — Z87891 Personal history of nicotine dependence: Secondary | ICD-10-CM

## 2013-04-21 DIAGNOSIS — E785 Hyperlipidemia, unspecified: Secondary | ICD-10-CM | POA: Diagnosis present

## 2013-04-21 DIAGNOSIS — I2699 Other pulmonary embolism without acute cor pulmonale: Secondary | ICD-10-CM

## 2013-04-21 DIAGNOSIS — J439 Emphysema, unspecified: Secondary | ICD-10-CM

## 2013-04-21 DIAGNOSIS — Q2111 Secundum atrial septal defect: Secondary | ICD-10-CM

## 2013-04-21 HISTORY — DX: Chronic obstructive pulmonary disease, unspecified: J44.9

## 2013-04-21 HISTORY — DX: Respiratory failure, unspecified with hypoxia: J96.91

## 2013-04-21 HISTORY — DX: Unspecified osteoarthritis, unspecified site: M19.90

## 2013-04-21 HISTORY — DX: Other pulmonary embolism without acute cor pulmonale: I26.99

## 2013-04-21 HISTORY — DX: Pneumonia, unspecified organism: J18.9

## 2013-04-21 LAB — COMPREHENSIVE METABOLIC PANEL
ALK PHOS: 78 U/L (ref 39–117)
ALT: 14 U/L (ref 0–53)
ALT: 13 U/L (ref 0–53)
AST: 18 U/L (ref 0–37)
AST: 17 U/L (ref 0–37)
Albumin: 3.3 g/dL — ABNORMAL LOW (ref 3.5–5.2)
Albumin: 2.8 g/dL — ABNORMAL LOW (ref 3.5–5.2)
Alkaline Phosphatase: 87 U/L (ref 39–117)
BILIRUBIN TOTAL: 0.4 mg/dL (ref 0.3–1.2)
BUN: 16 mg/dL (ref 6–23)
BUN: 15 mg/dL (ref 6–23)
CHLORIDE: 97 meq/L (ref 96–112)
CO2: 25 mEq/L (ref 19–32)
CO2: 20 meq/L (ref 19–32)
Calcium: 9.2 mg/dL (ref 8.4–10.5)
Calcium: 8.5 mg/dL (ref 8.4–10.5)
Chloride: 97 mEq/L (ref 96–112)
Creatinine, Ser: 1.07 mg/dL (ref 0.50–1.35)
Creatinine, Ser: 1.04 mg/dL (ref 0.50–1.35)
GFR calc Af Amer: 85 mL/min — ABNORMAL LOW (ref >90)
GFR calc Af Amer: 88 mL/min — ABNORMAL LOW (ref >90)
GFR calc non Af Amer: 73 mL/min — ABNORMAL LOW (ref >90)
GFR, EST NON AFRICAN AMERICAN: 76 mL/min — AB (ref >90)
GLUCOSE: 210 mg/dL — AB (ref 70–99)
Glucose, Bld: 112 mg/dL — ABNORMAL HIGH (ref 70–99)
Potassium: 4.5 mEq/L (ref 3.7–5.3)
Potassium: 4.2 mEq/L (ref 3.7–5.3)
SODIUM: 135 meq/L — AB (ref 137–147)
Sodium: 136 mEq/L — ABNORMAL LOW (ref 137–147)
Total Bilirubin: 0.8 mg/dL (ref 0.3–1.2)
Total Protein: 7.2 g/dL (ref 6.0–8.3)
Total Protein: 6.5 g/dL (ref 6.0–8.3)

## 2013-04-21 LAB — CBC WITH DIFFERENTIAL/PLATELET
Basophils Absolute: 0 10*3/uL (ref 0.0–0.1)
Basophils Absolute: 0 10*3/uL (ref 0.0–0.1)
Basophils Relative: 0 % (ref 0–1)
Basophils Relative: 0 % (ref 0–1)
Eosinophils Absolute: 0 10*3/uL (ref 0.0–0.7)
Eosinophils Absolute: 0 10*3/uL (ref 0.0–0.7)
Eosinophils Relative: 0 % (ref 0–5)
Eosinophils Relative: 0 % (ref 0–5)
HCT: 38.6 % — ABNORMAL LOW (ref 39.0–52.0)
HEMATOCRIT: 35.3 % — AB (ref 39.0–52.0)
Hemoglobin: 13.4 g/dL (ref 13.0–17.0)
Hemoglobin: 12.2 g/dL — ABNORMAL LOW (ref 13.0–17.0)
LYMPHS PCT: 3 % — AB (ref 12–46)
Lymphocytes Relative: 8 % — ABNORMAL LOW (ref 12–46)
Lymphs Abs: 1.1 10*3/uL (ref 0.7–4.0)
Lymphs Abs: 0.3 10*3/uL — ABNORMAL LOW (ref 0.7–4.0)
MCH: 32 pg (ref 26.0–34.0)
MCH: 32.4 pg (ref 26.0–34.0)
MCHC: 34.7 g/dL (ref 30.0–36.0)
MCHC: 34.6 g/dL (ref 30.0–36.0)
MCV: 92.1 fL (ref 78.0–100.0)
MCV: 93.9 fL (ref 78.0–100.0)
MONO ABS: 0.2 10*3/uL (ref 0.1–1.0)
Monocytes Absolute: 1.6 10*3/uL — ABNORMAL HIGH (ref 0.1–1.0)
Monocytes Relative: 12 % (ref 3–12)
Monocytes Relative: 2 % — ABNORMAL LOW (ref 3–12)
Neutro Abs: 10.5 10*3/uL — ABNORMAL HIGH (ref 1.7–7.7)
Neutro Abs: 11.8 10*3/uL — ABNORMAL HIGH (ref 1.7–7.7)
Neutrophils Relative %: 79 % — ABNORMAL HIGH (ref 43–77)
Neutrophils Relative %: 96 % — ABNORMAL HIGH (ref 43–77)
Platelets: 274 10*3/uL (ref 150–400)
Platelets: 265 10*3/uL (ref 150–400)
RBC: 4.19 MIL/uL — ABNORMAL LOW (ref 4.22–5.81)
RBC: 3.76 MIL/uL — ABNORMAL LOW (ref 4.22–5.81)
RDW: 11.6 % (ref 11.5–15.5)
RDW: 11.7 % (ref 11.5–15.5)
WBC: 13.2 10*3/uL — ABNORMAL HIGH (ref 4.0–10.5)
WBC: 12.3 10*3/uL — ABNORMAL HIGH (ref 4.0–10.5)

## 2013-04-21 LAB — INFLUENZA PANEL BY PCR (TYPE A & B)
H1N1FLUPCR: NOT DETECTED
INFLBPCR: NEGATIVE
Influenza A By PCR: NEGATIVE

## 2013-04-21 LAB — PROTIME-INR
INR: 1.17 (ref 0.00–1.49)
Prothrombin Time: 14.7 seconds (ref 11.6–15.2)

## 2013-04-21 LAB — TROPONIN I: Troponin I: <0.3 ng/mL (ref <0.30)

## 2013-04-21 LAB — HEPARIN LEVEL (UNFRACTIONATED): HEPARIN UNFRACTIONATED: 0.24 [IU]/mL — AB (ref 0.30–0.70)

## 2013-04-21 LAB — STREP PNEUMONIAE URINARY ANTIGEN: Strep Pneumo Urinary Antigen: NEGATIVE

## 2013-04-21 LAB — PHOSPHORUS: Phosphorus: 2.3 mg/dL (ref 2.3–4.6)

## 2013-04-21 LAB — APTT: aPTT: 75 seconds — ABNORMAL HIGH (ref 24–37)

## 2013-04-21 LAB — MAGNESIUM: Magnesium: 1.8 mg/dL (ref 1.5–2.5)

## 2013-04-21 MED ORDER — SODIUM CHLORIDE 0.9 % IV SOLN
1000.0000 mL | Freq: Once | INTRAVENOUS | Status: AC
  Administered 2013-04-21: 1000 mL via INTRAVENOUS

## 2013-04-21 MED ORDER — ONDANSETRON HCL 4 MG PO TABS: 4.0000 mg | ORAL_TABLET | Freq: Four times a day (QID) | ORAL | Status: DC | PRN

## 2013-04-21 MED ORDER — GUAIFENESIN-CODEINE 100-10 MG/5ML PO SOLN: 5.0000 mL | Freq: Three times a day (TID) | ORAL | Status: DC | PRN

## 2013-04-21 MED ORDER — IPRATROPIUM BROMIDE 0.02 % IN SOLN
0.5000 mg | Freq: Once | RESPIRATORY_TRACT | Status: AC
  Administered 2013-04-21: 0.5 mg via RESPIRATORY_TRACT
  Filled 2013-04-21: qty 2.5

## 2013-04-21 MED ORDER — GUAIFENESIN-CODEINE 100-10 MG/5ML PO SYRP
5.0000 mL | ORAL_SOLUTION | Freq: Three times a day (TID) | ORAL | Status: DC | PRN
  Filled 2013-04-21: qty 5

## 2013-04-21 MED ORDER — ALBUTEROL SULFATE (2.5 MG/3ML) 0.083% IN NEBU: 5.0000 mg | INHALATION_SOLUTION | RESPIRATORY_TRACT | Status: AC | PRN

## 2013-04-21 MED ORDER — ENOXAPARIN SODIUM 60 MG/0.6ML ~~LOC~~ SOLN: 1.0000 mg/kg | Freq: Two times a day (BID) | SUBCUTANEOUS | Status: DC

## 2013-04-21 MED ORDER — ACETAMINOPHEN 325 MG PO TABS: 650.0000 mg | ORAL_TABLET | Freq: Four times a day (QID) | ORAL | Status: DC | PRN

## 2013-04-21 MED ORDER — SODIUM CHLORIDE 0.9 % IV SOLN
1000.0000 mL | INTRAVENOUS | Status: DC
  Administered 2013-04-21 – 2013-04-25 (×7): 1000 mL via INTRAVENOUS

## 2013-04-21 MED ORDER — IPRATROPIUM BROMIDE 0.02 % IN SOLN: 0.5000 mg | RESPIRATORY_TRACT | Status: DC

## 2013-04-21 MED ORDER — IPRATROPIUM BROMIDE 0.02 % IN SOLN
0.5000 mg | RESPIRATORY_TRACT | Status: DC | PRN
Start: 2013-04-21 — End: 2013-04-23

## 2013-04-21 MED ORDER — DEXTROSE 5 % IV SOLN
2.0000 g | Freq: Once | INTRAVENOUS | Status: DC
  Administered 2013-04-21: 2 g via INTRAVENOUS
  Filled 2013-04-21: qty 2

## 2013-04-21 MED ORDER — IOHEXOL 350 MG/ML SOLN
100.0000 mL | Freq: Once | INTRAVENOUS | Status: AC | PRN
  Administered 2013-04-21: 100 mL via INTRAVENOUS

## 2013-04-21 MED ORDER — IPRATROPIUM BROMIDE 0.02 % IN SOLN
0.5000 mg | RESPIRATORY_TRACT | Status: DC
  Administered 2013-04-21 – 2013-04-23 (×9): 0.5 mg via RESPIRATORY_TRACT
  Filled 2013-04-21 (×9): qty 2.5

## 2013-04-21 MED ORDER — HEPARIN BOLUS VIA INFUSION
3000.0000 [IU] | Freq: Once | INTRAVENOUS | Status: AC
  Administered 2013-04-21: 3000 [IU] via INTRAVENOUS
  Filled 2013-04-21: qty 3000

## 2013-04-21 MED ORDER — VITAMIN D 1000 UNITS PO TABS
2000.0000 [IU] | ORAL_TABLET | Freq: Every day | ORAL | Status: DC
  Administered 2013-04-21 – 2013-04-29 (×9): 2000 [IU] via ORAL
  Filled 2013-04-21 (×9): qty 2

## 2013-04-21 MED ORDER — LISINOPRIL 10 MG PO TABS
10.0000 mg | ORAL_TABLET | Freq: Every day | ORAL | Status: DC
  Administered 2013-04-21 – 2013-04-29 (×8): 10 mg via ORAL
  Filled 2013-04-21 (×9): qty 1

## 2013-04-21 MED ORDER — ALBUTEROL SULFATE (2.5 MG/3ML) 0.083% IN NEBU
5.0000 mg | INHALATION_SOLUTION | Freq: Once | RESPIRATORY_TRACT | Status: AC
  Administered 2013-04-21: 5 mg via RESPIRATORY_TRACT
  Filled 2013-04-21: qty 6

## 2013-04-21 MED ORDER — ACETAMINOPHEN 650 MG RE SUPP: 650.0000 mg | Freq: Four times a day (QID) | RECTAL | Status: DC | PRN

## 2013-04-21 MED ORDER — HYDROCOD POLST-CHLORPHEN POLST 10-8 MG/5ML PO LQCR
5.0000 mL | Freq: Once | ORAL | Status: AC
  Administered 2013-04-21: 5 mL via ORAL
  Filled 2013-04-21: qty 5

## 2013-04-21 MED ORDER — PANTOPRAZOLE SODIUM 40 MG PO TBEC
40.0000 mg | DELAYED_RELEASE_TABLET | Freq: Every day | ORAL | Status: DC
Start: 2013-04-21 — End: 2013-04-29
  Administered 2013-04-21 – 2013-04-29 (×9): 40 mg via ORAL
  Filled 2013-04-21 (×10): qty 1

## 2013-04-21 MED ORDER — DEXTROSE 5 % IV SOLN
2.0000 g | Freq: Three times a day (TID) | INTRAVENOUS | Status: DC
  Filled 2013-04-21 (×2): qty 2

## 2013-04-21 MED ORDER — SODIUM CHLORIDE 0.9 % IV SOLN
INTRAVENOUS | Status: AC
Start: 2013-04-21 — End: 2013-04-22
  Administered 2013-04-21: 17:00:00 via INTRAVENOUS

## 2013-04-21 MED ORDER — HYDROCODONE-ACETAMINOPHEN 5-325 MG PO TABS: 1.0000 | ORAL_TABLET | ORAL | Status: DC | PRN

## 2013-04-21 MED ORDER — DEXTROSE 5 % IV SOLN
2.0000 g | INTRAVENOUS | Status: DC
  Filled 2013-04-21: qty 2

## 2013-04-21 MED ORDER — VANCOMYCIN HCL IN DEXTROSE 750-5 MG/150ML-% IV SOLN
750.0000 mg | Freq: Two times a day (BID) | INTRAVENOUS | Status: DC
  Administered 2013-04-21 – 2013-04-24 (×6): 750 mg via INTRAVENOUS
  Filled 2013-04-21 (×8): qty 150

## 2013-04-21 MED ORDER — HEPARIN (PORCINE) IN NACL 100-0.45 UNIT/ML-% IJ SOLN
1400.0000 [IU]/h | INTRAMUSCULAR | Status: DC
  Administered 2013-04-21: 1000 [IU]/h via INTRAVENOUS
  Administered 2013-04-22: 1200 [IU]/h via INTRAVENOUS
  Administered 2013-04-23: 1400 [IU]/h via INTRAVENOUS
  Filled 2013-04-21 (×4): qty 250

## 2013-04-21 MED ORDER — ONDANSETRON HCL 4 MG/2ML IJ SOLN
4.0000 mg | Freq: Four times a day (QID) | INTRAMUSCULAR | Status: DC | PRN
Start: 2013-04-21 — End: 2013-04-29
  Administered 2013-04-27: 4 mg via INTRAVENOUS
  Filled 2013-04-21: qty 2

## 2013-04-21 MED ORDER — PREDNISONE 20 MG PO TABS
60.0000 mg | ORAL_TABLET | Freq: Once | ORAL | Status: AC
  Administered 2013-04-21: 60 mg via ORAL
  Filled 2013-04-21: qty 3

## 2013-04-21 MED ORDER — FAMOTIDINE 20 MG PO TABS
20.0000 mg | ORAL_TABLET | Freq: Two times a day (BID) | ORAL | Status: DC
  Administered 2013-04-21 – 2013-04-29 (×16): 20 mg via ORAL
  Filled 2013-04-21 (×17): qty 1

## 2013-04-21 MED ORDER — ALBUTEROL SULFATE (2.5 MG/3ML) 0.083% IN NEBU: 5.0000 mg | INHALATION_SOLUTION | RESPIRATORY_TRACT | Status: DC

## 2013-04-21 MED ORDER — SODIUM CHLORIDE 0.9 % IV SOLN: INTRAVENOUS | Status: AC

## 2013-04-21 MED ORDER — ALBUTEROL SULFATE (2.5 MG/3ML) 0.083% IN NEBU
5.0000 mg | INHALATION_SOLUTION | RESPIRATORY_TRACT | Status: DC
Start: 2013-04-21 — End: 2013-04-23
  Administered 2013-04-21: 2.5 mg via RESPIRATORY_TRACT
  Administered 2013-04-22 (×5): 5 mg via RESPIRATORY_TRACT
  Administered 2013-04-22 (×2): 2.5 mg via RESPIRATORY_TRACT
  Administered 2013-04-23: 5 mg via RESPIRATORY_TRACT
  Filled 2013-04-21 (×9): qty 6

## 2013-04-21 NOTE — H&P (Signed)
[facility] History and Physical  SAYVION VIGEN TZG:[id number] DOB: Aug 12, 1950 DOA: 04/21/2013  Referring physician: ER physician PCP: [name], MD   Chief Complaint: shortness of breath  HPI:  63 year old male with past medical history of hypertension, recent pneumonia treated with cefdinir for 1 week who presented to [facility] ED 04/21/2013 with progressively worsening shortness of breath and cough which now he noticed has streaks of blood  In it. He also has chest tightness associated with cough straining. Pt also reported fever, chills and fatigue at home. He said antibiotic at home did not help with symptomatic relief significantly. No abdominal pain, nausea or vomiting. No lightheadedness or loss of consciousness. In ED, BP was 116/61, HR 85, Tmax 98.1 F and oxygen saturation 91% on 2 L nasal canula. Blood work revealed leukocytosis of 13 and sodium of 136 otherwise unremarkable. CXR showed progressive increased density in the right lower lobe consistent with worsening atelectasis or pneumonia. Seen also was soft tissue fullness in the hilar region is suspicious for lymphadenopathy or mass for which reason we obtained CT chest. Pt started on empiric cefepime, vanco. Blood cultures ordered as well.  Assessment and Plan:  Principal Problem:   Respiratory failure with hypoxia - secondary to pneumonia, questionable postobstructive pneumonia - will start vanco and cefepime - pneumonia order set in place - follow up blood culture results, strep pneumonia, resp culture results, legionella, influenza results - oxygen support via nasal canula to keep O2 saturation above 90%  Active Problems:   HCAP (healthcare-associated pneumonia) - treat with vanco and cefepime and follow up blood work as above associated pneumonia order set - also, follow up on CT chest which was ordered to evaluate for possible postobstructive pneumonia/?malignancy   Hemoptysis - likely due to cough and straining  related to cough - continue to monitor for other episodes of hemoptysis   GERD (gastroesophageal reflux disease) - continue protonix and Pepcid; pt is on 2 meds for GERD at home    HTN (hypertension) - continue lisinopril   Radiological Exams on Admission: Dg Chest 2 View 04/21/2013    IMPRESSION: Progressive increased density in the right lower lobe is present and is consistent with worsening atelectasis or pneumonia. Soft tissue fullness in the hilar region is suspicious for lymphadenopathy or mass. Chest CT scanning is recommended.      Code Status: Full Family Communication: Pt at bedside Disposition Plan: Admit for further evaluation  Leisa Lenz, MD  [facility] Pager [phone]  Review of Systems:  Constitutional: positive e for fever, chills and malaise/fatigue. Negative for diaphoresis.  HENT: Negative for hearing loss, ear pain, nosebleeds, congestion, sore throat, neck pain, tinnitus and ear discharge.   Eyes: Negative for blurred vision, double vision, photophobia, pain, discharge and redness.  Respiratory: positive  for cough, hemoptysis, sputum production, shortness of breath, wheezing .   Cardiovascular: Negative for chest pain, palpitations, orthopnea, claudication and leg swelling.  Gastrointestinal: Negative for nausea, vomiting and abdominal pain. Negative for heartburn, constipation, blood in stool and melena.  Genitourinary: Negative for dysuria, urgency, frequency, hematuria and flank pain.  Musculoskeletal: Negative for myalgias, back pain, joint pain and falls.  Skin: Negative for itching and rash.  Neurological: Negative for dizziness and weakness. Negative for tingling, tremors, sensory change, speech change, focal weakness, loss of consciousness and headaches.  Endo/Heme/Allergies: Negative for environmental allergies and polydipsia. Does not bruise/bleed easily.  Psychiatric/Behavioral: Negative for suicidal ideas. The patient is not nervous/anxious.  Past Medical History  Diagnosis Date  . Hyperlipidemia   . Hypertension   . GERD (gastroesophageal reflux disease)    Past Surgical History  Procedure Laterality Date  . Hernia repair      Right inguinal   Social History:  reports that he quit smoking about 15 months ago. He does not have any smokeless tobacco history on file. He reports that he does not drink alcohol or use illicit drugs.  Allergies  Allergen Reactions  . Augmentin [Amoxicillin-Pot Clavulanate] Itching and Rash    Family History: h.o in family of colonic polyps  Prior to Admission medications   Medication Sig Start Date End Date Taking? Authorizing Provider  cefdinir (OMNICEF) 300 MG capsule Take 1 capsule (300 mg total) by mouth 2 (two) times daily. 04/18/13  Yes Vernie Shanks, MD  Cholecalciferol (VITAMIN D) 2000 UNITS CAPS Take 2,000 Units by mouth daily.    Yes Historical Provider, MD  clarithromycin (BIAXIN) 500 MG tablet Take 1 tablet (500 mg total) by mouth 2 (two) times daily. 04/14/13  Yes Vernie Shanks, MD  guaiFENesin-codeine [facility]) 100-10 MG/5ML syrup Take 5 mLs by mouth 3 (three) times daily as needed for cough. 04/14/13  Yes Vernie Shanks, MD  KRILL OIL PO Take 1 tablet by mouth daily.   Yes Historical Provider, MD  lisinopril (PRINIVIL,ZESTRIL) 10 MG tablet Take 10 mg by mouth daily.    Yes Historical Provider, MD  pantoprazole (PROTONIX) 40 MG tablet Take 1 tablet (40 mg total) by mouth daily. 04/08/13  Yes Vernie Shanks, MD  ranitidine (ZANTAC) 300 MG capsule Take 1 capsule (300 mg total) by mouth daily. 04/08/13  Yes Vernie Shanks, MD   Physical Exam: Filed Vitals:   04/21/13 1206 04/21/13 1237 04/21/13 1315  BP: 117/68  116/61  Pulse: 85  97  Temp: 98.1 F (36.7 C)    Resp: 18  20  Height: 5\' 7"  (1.702 m)    Weight: 59.421 kg (131 lb)    SpO2: 93% 93% 91%    Physical Exam  Constitutional: Appears well-developed and well-nourished. No distress.  HENT: Normocephalic.  External right and left ear normal. Eyes: Conjunctivae and EOM are normal. PERRLA, no scleral icterus.  Neck: Normal ROM. Neck supple. No JVD. No tracheal deviation. No thyromegaly.  CVS: RRR, S1/S2 +, no murmurs, no gallops, no carotid bruit.  Pulmonary: expiratory wheezing appreciated, diminished breath sounds Abdominal: Soft. BS +,  no distension, tenderness, rebound or guarding.  Musculoskeletal: Normal range of motion. No edema and no tenderness.  Lymphadenopathy: No lymphadenopathy noted, cervical, inguinal. Neuro: Alert. Normal reflexes, muscle tone coordination. No cranial nerve deficit. Skin: Skin is warm and dry. No rash noted. Not diaphoretic.  Psychiatric: Normal mood and affect. Behavior, judgment, thought content normal.   Labs on Admission:  Basic Metabolic Panel:  Recent Labs Lab 04/21/13 1223  NA 136*  K 4.5  CL 97  CO2 25  GLUCOSE 112*  BUN 16  CREATININE 1.07  CALCIUM 9.2   Liver Function Tests:  Recent Labs Lab 04/21/13 1223  AST 18  ALT 14  ALKPHOS 87  BILITOT 0.8  PROT 7.2  ALBUMIN 3.3*   No results found for this basename: LIPASE, AMYLASE,  in the last 168 hours No results found for this basename: AMMONIA,  in the last 168 hours CBC:  Recent Labs Lab 04/21/13 1223  WBC 13.2*  NEUTROABS 10.5*  HGB 13.4  HCT 38.6*  MCV 92.1  PLT  274   Cardiac Enzymes:  Recent Labs Lab 04/21/13 1224  TROPONINI <0.30   BNP: No components found with this basename: POCBNP,  CBG: No results found for this basename: GLUCAP,  in the last 168 hours  If 7PM-7AM, please contact night-coverage [email or web address] Password [facility] 04/21/2013, 2:56 PM

## 2013-04-21 NOTE — ED Provider Notes (Signed)
CSN: [id number]     Arrival date & time 04/21/13  1150 History   First MD Initiated Contact with Patient 04/21/13 1159     Chief Complaint  Patient presents with  . Hemoptysis     (Consider location/radiation/quality/duration/timing/severity/associated sxs/prior Treatment) HPI Ricky Villanueva is a 63 y.o. male who presents to ED with complaint of cough. Pt states his cough started around 3 weeks ago. States 2 weeks ago he began feeling shortness of breath so he went to see his primary care Dr. At that time he had a chest x-ray done which showed right lower lobe pneumonia. Patient was started on Biaxin. He was also given cough medicine. Patient states that he wasn't feeling any better and went back to his primary care doctor 1 week ago. At that time they added Omnicef to his medications. Patient states his next appointment for recheck was today, but offices are closed due to snow. Patient states that he's feeling worse. He reports persistent productive cough, with colored sputum with tinges of blood. States that he still taking antibiotics. He is a former smoker quit one year ago, after 40 years of smoking. He also states that he has had some subjective fevers and chills at home but did not measure his temperature. He states that he is having pain in the right ribs with coughing. States pain is worsened with cough and deep breathing, nothing makes it better. Patient denies any other chest pain.  Past Medical History  Diagnosis Date  . Hyperlipidemia   . Hypertension   . GERD (gastroesophageal reflux disease)    Past Surgical History  Procedure Laterality Date  . Hernia repair      Right inguinal   History reviewed. No pertinent family history. History  Substance Use Topics  . Smoking status: Former Smoker    Quit date: 12/26/2011  . Smokeless tobacco: Not on file  . Alcohol Use: No    Review of Systems  Constitutional: Positive for fever and chills.  HENT: Positive for congestion.    Respiratory: Positive for cough, shortness of breath and wheezing. Negative for chest tightness.   Cardiovascular: Positive for chest pain. Negative for palpitations and leg swelling.  Gastrointestinal: Negative for nausea, vomiting, abdominal pain, diarrhea and abdominal distention.  Genitourinary: Negative for dysuria.  Musculoskeletal: Positive for myalgias. Negative for arthralgias, neck pain and neck stiffness.  Skin: Negative for rash.  Allergic/Immunologic: Negative for immunocompromised state.  Neurological: Negative for dizziness, weakness, light-headedness, numbness and headaches.      Allergies  Augmentin  Home Medications   Current Outpatient Rx  Name  Route  Sig  Dispense  Refill  . cefdinir (OMNICEF) 300 MG capsule   Oral   Take 1 capsule (300 mg total) by mouth 2 (two) times daily.   20 capsule   0   . Cholecalciferol (VITAMIN D) 2000 UNITS CAPS   Oral   Take 2,000 Units by mouth daily.          . clarithromycin (BIAXIN) 500 MG tablet   Oral   Take 1 tablet (500 mg total) by mouth 2 (two) times daily.   20 tablet   0   . guaiFENesin-codeine (CHERATUSSIN AC) 100-10 MG/5ML syrup   Oral   Take 5 mLs by mouth 3 (three) times daily as needed for cough.   120 mL   0   . KRILL OIL PO   Oral   Take 1 tablet by mouth daily.         [name]  lisinopril (PRINIVIL,ZESTRIL) 10 MG tablet   Oral   Take 10 mg by mouth daily.          . pantoprazole (PROTONIX) 40 MG tablet   Oral   Take 1 tablet (40 mg total) by mouth daily.   90 tablet   0   . ranitidine (ZANTAC) 300 MG capsule   Oral   Take 1 capsule (300 mg total) by mouth daily.   90 capsule   0    BP 117/68  Pulse 85  Temp(Src) 98.1 F (36.7 C)  Resp 18  Ht 5\' 7"  (1.702 m)  Wt 131 lb (59.421 kg)  BMI 20.51 kg/m2  SpO2 93% Physical Exam  Nursing note and vitals reviewed. Constitutional: He is oriented to person, place, and time. He appears well-developed and well-nourished. No distress.   HENT:  Head: Normocephalic and atraumatic.  Eyes: Conjunctivae are normal.  Neck: Neck supple.  Cardiovascular: Normal rate, regular rhythm and normal heart sounds.   Pulmonary/Chest: Effort normal. No respiratory distress. He has wheezes. He has no rales.  Expiratory wheezes in bilateral lung  Abdominal: Soft. Bowel sounds are normal. He exhibits no distension. There is no tenderness. There is no rebound.  Musculoskeletal: He exhibits no edema.  Neurological: He is alert and oriented to person, place, and time.  Skin: Skin is warm and dry.  Psychiatric: He has a normal mood and affect. His behavior is normal.    ED Course  Procedures (including critical care time) Labs Review Labs Reviewed  CBC WITH DIFFERENTIAL - Abnormal; Notable for the following:    WBC 13.2 (*)    RBC 4.19 (*)    HCT 38.6 (*)    Neutrophils Relative % 79 (*)    Neutro Abs 10.5 (*)    Lymphocytes Relative 8 (*)    Monocytes Absolute 1.6 (*)    All other components within normal limits  COMPREHENSIVE METABOLIC PANEL - Abnormal; Notable for the following:    Sodium 136 (*)    Glucose, Bld 112 (*)    Albumin 3.3 (*)    GFR calc non Af Amer 73 (*)    GFR calc Af Amer 85 (*)    All other components within normal limits  CULTURE, BLOOD (ROUTINE X 2)  CULTURE, BLOOD (ROUTINE X 2)  CULTURE, EXPECTORATED SPUTUM-ASSESSMENT  TROPONIN I  LEGIONELLA ANTIGEN, URINE  STREP PNEUMONIAE URINARY ANTIGEN  INFLUENZA PANEL BY PCR (TYPE A & B, H1N1)   Imaging Review Dg Chest 2 View  04/21/2013   CLINICAL DATA:  Cough and hemoptysis  EXAM: CHEST  2 VIEW  COMPARISON:  DG CHEST 2V dated 04/14/2013  FINDINGS: There has been an interval increase in the density of the confluent infiltrate in the right lower lobe consistent with progressive pneumonia. The right middle and upper lobes appear clear. The left lung appears clear. No significant pleural effusion is demonstrated. The cardiac silhouette is normal in size. The pulmonary  vascularity is not engorged. The trachea is midline. There is soft tissue fullness in the right hilar region which may reflect lymphadenopathy. This is not new.  IMPRESSION: Progressive increased density in the right lower lobe is present and is consistent with worsening atelectasis or pneumonia. Soft tissue fullness in the hilar region is suspicious for lymphadenopathy or mass. Chest CT scanning is recommended.   Electronically Signed   By: David  Martinique   On: 04/21/2013 13:48    EKG Interpretation    Date/Time:  Thursday April 21 2013  12:57:12 EST Ventricular Rate:  94 PR Interval:  141 QRS Duration: 80 QT Interval:  332 QTC Calculation: 415 R Axis:   80 Text Interpretation:  Sinus rhythm Probable anteroseptal infarct, old No significant change was found Confirmed by [facility]  MD, TREY ([id number]) on 04/21/2013 2:25:08 PM            MDM   Final diagnoses:  None    Pt with worsening cough for 3 week, has already taken 2 rounds of antibiotics, Biaxin and Omnicef. He is worsening. He does have some wheezing on my exam. Will start on nebs, prednisone started. Given 60 mg by mouth. Will recheck chest x-ray and labs.  Patient's chest x-ray showed worsening pneumonia, I did speak with Dr. Martinique, radiologist who read her chest x-ray, he is concerned about worsening infection versus obstructive malignancy. He recommended CT chest with contrast. We did get CT chest angiogram to also rule out a PE. Patient was started on antibiotics for her healthcare acquired pneumonia, given he failed 2 outpatient treatments. Blood culture sent. I spoke with [facility] who will admit patient. Patient is mildly hypoxic with oxygen saturation between 89 and 91, otherwise normal vital signs  Blood pressure 116/61, pulse 97, temperature 98.1 F (36.7 C), resp. rate 20, height 5\' 7"  (1.702 m), weight 131 lb (59.421 kg), SpO2 93.00%. Filed Vitals:   04/21/13 1206 04/21/13 1237 04/21/13 1315 04/21/13 1459   BP: 117/68  116/61   Pulse: 85  97   Temp: 98.1 F (36.7 C)     Resp: 18  20   Height: 5\' 7"  (1.702 m)     Weight: 131 lb (59.421 kg)     SpO2: 93% 93% 91% 93%    4:05 PM Spoke with radiologist. CT positive for possible small PE and most likely malignancy. I will page hospitalist and pass on results. Initially ordered lovenox for PE tx, but since pt had hemoptosis, based on pharmacy recommendation switched to heparin.      Renold Genta, PA-C 04/21/13 1549  Renold Genta, PA-C 04/21/13 1623  Renold Genta, PA-C 04/21/13 1628

## 2013-04-21 NOTE — ED Notes (Signed)
Pt from home, c/o hemoptysis from pneumonia infection. Per pt, he has been treated for pneumonia x 1 week, started coughing blood streaked sputum today. Pt c/o right rib pain with coughing.

## 2013-04-21 NOTE — Progress Notes (Signed)
ANTICOAGULATION CONSULT NOTE - Follow Up Consult  Pharmacy Consult for heparin Indication: pulmonary embolus  Labs:  Recent Labs  04/21/13 1223 04/21/13 1224 04/21/13 1820 04/21/13 2238  HGB 13.4  --  12.2*  --   HCT 38.6*  --  35.3*  --   PLT 274  --  265  --   APTT  --   --  75*  --   LABPROT  --   --  14.7  --   INR  --   --  1.17  --   HEPARINUNFRC  --   --   --  0.24*  CREATININE 1.07  --  1.04  --   TROPONINI  --  <0.30  --   --     Assessment: 62yo male subtherapeutic on heparin with initial dosing for PE.  Goal of Therapy:  Heparin level 0.3-0.7 units/ml   Plan:  Will increase heparin gtt by 2 units/kg/hr to 1100 units/hr and check level with am labs.  Wynona Neat, PharmD, BCPS  04/21/2013,11:53 PM

## 2013-04-21 NOTE — Progress Notes (Signed)
Patient requested that ""do not tell to his wife about the lungs mass found on his chest x-ray"".

## 2013-04-21 NOTE — ED Notes (Signed)
Patient transported to X-ray 

## 2013-04-21 NOTE — Progress Notes (Signed)
ANTICOAGULATION CONSULT NOTE - Initial Consult  Pharmacy Consult for heparin Indication: pulmonary embolus  Allergies  Allergen Reactions  . Augmentin [Amoxicillin-Pot Clavulanate] Itching and Rash    Patient Measurements: Height: 5\' 7"  (170.2 cm) Weight: 131 lb (59.421 kg) IBW/kg (Calculated) : 66.1 Heparin Dosing Weight: 59kg  Vital Signs: Temp: 98.1 F (36.7 C) (02/26 1206) BP: 116/61 mmHg (02/26 1315) Pulse Rate: 97 (02/26 1315)  Labs:  Recent Labs  04/21/13 1223 04/21/13 1224  HGB 13.4  --   HCT 38.6*  --   PLT 274  --   CREATININE 1.07  --   TROPONINI  --  <0.30    Estimated Creatinine Clearance: 60.9 ml/min (by C-G formula based on Cr of 1.07).   Medical History: Past Medical History  Diagnosis Date  . Hyperlipidemia   . Hypertension   . GERD (gastroesophageal reflux disease)     Medications:   (Not in a hospital admission) Scheduled:  . vancomycin  750 mg Intravenous Q12H   Infusions:  . sodium chloride 1,000 mL (04/21/13 1612)  . ceFEPime (MAXIPIME) IV Stopped (04/21/13 1611)  . heparin    . heparin      Assessment: 63 yo who came in with hemoptysis. He was started on abx for PNA. He was taken to CT and it did show PE. Lovenox was ordered but due to the hemoptysis, we changed it to IV heparin if bleeding cont to be an issue.   Goal of Therapy:  Heparin level 0.3-0.7 units/ml Monitor platelets by anticoagulation protocol: Yes   Plan:   Heparin bolus 3000 units x1 Heparin drip at 1000 units/hr Check 6 hep level Daily HL and CBC

## 2013-04-21 NOTE — Progress Notes (Addendum)
ANTIBIOTIC CONSULT NOTE - INITIAL  Pharmacy Consult for vanc  Indication: pneumonia  Allergies  Allergen Reactions  . Augmentin [Amoxicillin-Pot Clavulanate] Itching and Rash    Patient Measurements: Height: 5\' 7"  (170.2 cm) Weight: 131 lb (59.421 kg) IBW/kg (Calculated) : 66.1 Adjusted Body Weight:   Vital Signs: Temp: 98.1 F (36.7 C) (02/26 1206) BP: 117/68 mmHg (02/26 1206) Pulse Rate: 85 (02/26 1206) Intake/Output from previous day:   Intake/Output from this shift:    Labs:  Recent Labs  04/21/13 1223  WBC 13.2*  HGB 13.4  PLT 274  CREATININE 1.07   Estimated Creatinine Clearance: 60.9 ml/min (by C-G formula based on Cr of 1.07). No results found for this basename: VANCOTROUGH, VANCOPEAK, VANCORANDOM, GENTTROUGH, GENTPEAK, GENTRANDOM, TOBRATROUGH, TOBRAPEAK, TOBRARND, AMIKACINPEAK, AMIKACINTROU, AMIKACIN,  in the last 72 hours   Microbiology: No results found for this or any previous visit (from the past 720 hour(s)).  Medical History: Past Medical History  Diagnosis Date  . Hyperlipidemia   . Hypertension   . GERD (gastroesophageal reflux disease)     Medications:   (Not in a hospital admission) Scheduled:   Assessment: 63 yo who was seen for hemoptysis. He was recently treated for PNA x 1 week. Vanc has been ordered for HCAP. D/w with MD will change aztreonam to cefepime.   Goal of Therapy:  Vancomycin trough level 15-20 mcg/ml  Plan:   Vanc 750mg  IV q12 Will get trough at steady state Will change to cefepime 2g IV q24  Onnie Boer, PharmD Pager: [phone] 04/21/2013 2:22 PM

## 2013-04-21 NOTE — ED Notes (Signed)
Patient transported to CT 

## 2013-04-22 ENCOUNTER — Telehealth: Payer: Self-pay

## 2013-04-22 DIAGNOSIS — R222 Localized swelling, mass and lump, trunk: Secondary | ICD-10-CM

## 2013-04-22 DIAGNOSIS — R918 Other nonspecific abnormal finding of lung field: Secondary | ICD-10-CM

## 2013-04-22 DIAGNOSIS — I2699 Other pulmonary embolism without acute cor pulmonale: Secondary | ICD-10-CM

## 2013-04-22 LAB — TSH: TSH: 0.59 u[IU]/mL (ref 0.350–4.500)

## 2013-04-22 LAB — COMPREHENSIVE METABOLIC PANEL
ALT: 10 U/L (ref 0–53)
AST: 14 U/L (ref 0–37)
Albumin: 2.6 g/dL — ABNORMAL LOW (ref 3.5–5.2)
Alkaline Phosphatase: 70 U/L (ref 39–117)
BILIRUBIN TOTAL: 0.3 mg/dL (ref 0.3–1.2)
BUN: 17 mg/dL (ref 6–23)
CHLORIDE: 102 meq/L (ref 96–112)
CO2: 22 meq/L (ref 19–32)
CREATININE: 0.96 mg/dL (ref 0.50–1.35)
Calcium: 8.7 mg/dL (ref 8.4–10.5)
GFR calc Af Amer: >90 mL/min (ref >90)
GFR calc non Af Amer: 88 mL/min — ABNORMAL LOW (ref >90)
Glucose, Bld: 216 mg/dL — ABNORMAL HIGH (ref 70–99)
Potassium: 3.9 mEq/L (ref 3.7–5.3)
Sodium: 137 mEq/L (ref 137–147)
Total Protein: 6.1 g/dL (ref 6.0–8.3)

## 2013-04-22 LAB — CBC
HCT: 33.4 % — ABNORMAL LOW (ref 39.0–52.0)
HEMOGLOBIN: 11.4 g/dL — AB (ref 13.0–17.0)
MCH: 31.8 pg (ref 26.0–34.0)
MCHC: 34.1 g/dL (ref 30.0–36.0)
MCV: 93 fL (ref 78.0–100.0)
PLATELETS: 251 10*3/uL (ref 150–400)
RBC: 3.59 MIL/uL — AB (ref 4.22–5.81)
RDW: 11.8 % (ref 11.5–15.5)
WBC: 15.5 10*3/uL — AB (ref 4.0–10.5)

## 2013-04-22 LAB — LEGIONELLA ANTIGEN, URINE: LEGIONELLA ANTIGEN, URINE: NEGATIVE

## 2013-04-22 LAB — HEPARIN LEVEL (UNFRACTIONATED)
HEPARIN UNFRACTIONATED: 0.27 [IU]/mL — AB (ref 0.30–0.70)
Heparin Unfractionated: 0.57 IU/mL (ref 0.30–0.70)

## 2013-04-22 LAB — GLUCOSE, CAPILLARY: GLUCOSE-CAPILLARY: 156 mg/dL — AB (ref 70–99)

## 2013-04-22 MED ORDER — DEXTROSE 5 % IV SOLN
2.0000 g | Freq: Two times a day (BID) | INTRAVENOUS | Status: DC
  Administered 2013-04-22 – 2013-04-27 (×10): 2 g via INTRAVENOUS
  Filled 2013-04-22 (×12): qty 2

## 2013-04-22 NOTE — Progress Notes (Signed)
ANTICOAGULATION CONSULT NOTE - Follow Up Consult  Pharmacy Consult for heparin Indication: pulmonary embolus  Labs:  Recent Labs  04/21/13 1223 04/21/13 1224 04/21/13 1820 04/21/13 2238 04/22/13 0406  HGB 13.4  --  12.2*  --  11.4*  HCT 38.6*  --  35.3*  --  33.4*  PLT 274  --  265  --  251  APTT  --   --  75*  --   --   LABPROT  --   --  14.7  --   --   INR  --   --  1.17  --   --   HEPARINUNFRC  --   --   --  0.24* 0.27*  CREATININE 1.07  --  1.04  --  0.96  TROPONINI  --  <0.30  --   --   --     Assessment: 62yo male subtherapeutic on heparin after rate increase though lab was drawn early.  Goal of Therapy:  Heparin level 0.3-0.7 units/ml   Plan:  Will increase heparin gtt by 1-2 units/kg/hr to 1200 units/hr and check level in 6hr.  Wynona Neat, PharmD, BCPS  04/22/2013,5:25 AM

## 2013-04-22 NOTE — Telephone Encounter (Signed)
Wife calling stating needs work note from Feb 19 - til now . Had follow up appt on feb 26 but office closed "inclement weather" Pt went to hosp and has "Clots in lungs" and pneumonia .

## 2013-04-22 NOTE — Progress Notes (Signed)
ANTICOAGULATION and ANTIBIOTIC CONSULT NOTE - Follow Up Consult  Pharmacy Consult for Heparin, Vancomycin, and Cefepime Indication: pulmonary embolus; HCAP  Allergies  Allergen Reactions  . Augmentin [Amoxicillin-Pot Clavulanate] Itching and Rash    Patient Measurements: Height: 5\' 7"  (170.2 cm) Weight: 128 lb 4.9 oz (58.2 kg) IBW/kg (Calculated) : 66.1 Heparin Dosing Weight: 58 kg  Vital Signs: Temp: 97.4 F (36.3 C) (02/27 1234) Temp src: Oral (02/27 1234) BP: 108/54 mmHg (02/27 1234) Pulse Rate: 65 (02/27 1234)  Labs:  Recent Labs  04/21/13 1223 04/21/13 1224 04/21/13 1820 04/21/13 2238 04/22/13 0406 04/22/13 1150  HGB 13.4  --  12.2*  --  11.4*  --   HCT 38.6*  --  35.3*  --  33.4*  --   PLT 274  --  265  --  251  --   APTT  --   --  75*  --   --   --   LABPROT  --   --  14.7  --   --   --   INR  --   --  1.17  --   --   --   HEPARINUNFRC  --   --   --  0.24* 0.27* 0.57  CREATININE 1.07  --  1.04  --  0.96  --   TROPONINI  --  <0.30  --   --   --   --     Estimated Creatinine Clearance: 66.5 ml/min (by C-G formula based on Cr of 0.96).   Medications:  Scheduled:  . sodium chloride   Intravenous STAT  . albuterol  5 mg Nebulization Q4H  . ceFEPime (MAXIPIME) IV  2 g Intravenous Q24H  . cholecalciferol  2,000 Units Oral Daily  . famotidine  20 mg Oral BID  . ipratropium  0.5 mg Nebulization Q4H  . lisinopril  10 mg Oral Daily  . pantoprazole  40 mg Oral Daily  . vancomycin  750 mg Intravenous Q12H   Infusions:  . sodium chloride 1,000 mL (04/22/13 0754)  . heparin 1,200 Units/hr (04/22/13 1301)    Assessment: 63 yo M who presented to ED 2/26 with progressively worsening SOB and cough with hemoptysis. CXR consistent with atelectasis or pna; possible lymphadenopathy or mass. CT was ordered to eval mass when pt was found to have PE.  Pt currently on heparin at 1200 units/hr with therapeutic level.  Awaiting start of oral anticoagulants pending pulm  recs for evaluation of mass.  Continues on Vancomycin and Cefepime for presumed HCAP (abx day#2).  Renal function with slight improvement, CrCl ~ 65 ml/min.  Pt is afebrile but WBC has increased slightly.    Urine Legionella neg Urine Strep PNA neg Flu neg 2/26 blood x 2 ngtd   Goal of Therapy:  Heparin level 0.3-0.7 units/ml Monitor platelets by anticoagulation protocol: Yes  Vancomycin trough 15-20 mcg/ml   Plan:  Continue heparin at 1200 units/hr. Heparin level and CBC daily with AM labs. Follow-up plans for oral anticoagulation. Continue Vancomycin 750 mg IV q12h Increase Cefepime to 2gm IV q12h based on improved renal function.  [employer], Pharm.D., BCPS Clinical Pharmacist Pager [phone] 04/22/2013 3:15 PM

## 2013-04-22 NOTE — Progress Notes (Signed)
Utilization review completed.  

## 2013-04-22 NOTE — Telephone Encounter (Signed)
Note done --wife aware .

## 2013-04-22 NOTE — Consult Note (Signed)
Name: Ricky Villanueva MRN: [id number] DOB: 1950/10/05    ADMISSION DATE:  04/21/2013 CONSULTATION DATE:  04/22/13  REFERRING MD :  Dr. Charlies Silvers  PRIMARY SERVICE:  TRH  CHIEF COMPLAINT:  PE, ? Lung Mass  BRIEF PATIENT DESCRIPTION: 63 y/o M admitted 2/26 with progressive SOB & Cough.  Work up demonstrated CXR with progressive RLL airspace disease and soft tissue fullness in R hilar region.  CT Chest obtained demonstrating non-occlusive LUL, RLL PE.  PCCM consulted for evaluation 2/27   SIGNIFICANT EVENTS / STUDIES:  2/26 - admit with progressive SOB, cough.  CT Chest>>>non-occlusive LUL, RLL PE, R hilar lymphadenopathy, consolidation in RLL    CULTURES: BCx2 2/26>>>  ANTIBIOTICS: Cefepime 2/26>>> Vanco 2/26>>>  HISTORY OF PRESENT ILLNESS:  63 y/o M, former heavy smoker (smoked 2ppd, for almost 47 years), with PMH of HTN, HLD, GERD, and COPD who presented to [facility] on 2/26 with progressive SOB & Cough.  Patient was seen by PCP on 2/19 with a two week history of cough, chest congestion and intermittent fevers.  He was treated with clarithromycin, rocephin & guaifenesin.  He followed up with PCP on 2/23 with little change in symptoms,  CXR at that time noted RLL PNA with RML hilar reactive adenopathy.  Omnicef was added to his regimen.  He continued to have worsening of symptoms with persistent productive cough, blood streaked sputum and right sided pleuritic chest pain.  He presented to ER for further care.  Work up demonstrated CXR with progressive RLL airspace disease and soft tissue fullness in R hilar region.  CT Chest obtained demonstrating non-occlusive LUL, RLL PE.  PCCM consulted for evaluation 2/27.   Patient reports with insurance changes he has made an effort for more healthy lifestyle over past 1 1/2 - quit smoking, walks more, stopped drinking soda etc.    PAST MEDICAL HISTORY :  Past Medical History  Diagnosis Date  . Hyperlipidemia   . Hypertension   . GERD (gastroesophageal  reflux disease)   . Respiratory failure with hypoxia 04/21/2013    secondary to pneumonia/notes 04/21/2013  . Pulmonary embolism     "got one in there now" (04/21/2013)  . COPD (chronic obstructive pulmonary disease)   . Pneumonia 2/?01/2014    "wouldn't get better; hospitalized 04/21/2013)  . Arthritis     "minor in my right hand" (04/21/2013)   Past Surgical History  Procedure Laterality Date  . Inguinal hernia repair Right ~ 2005  . Finger surgery Left ~ 1989    "crushed end of my middle finger off"    Prior to Admission medications   Medication Sig Start Date End Date Taking? Authorizing Provider  cefdinir (OMNICEF) 300 MG capsule Take 1 capsule (300 mg total) by mouth 2 (two) times daily. 04/18/13  Yes Vernie Shanks, MD  Cholecalciferol (VITAMIN D) 2000 UNITS CAPS Take 2,000 Units by mouth daily.    Yes Historical Provider, MD  clarithromycin (BIAXIN) 500 MG tablet Take 1 tablet (500 mg total) by mouth 2 (two) times daily. 04/14/13  Yes Vernie Shanks, MD  guaiFENesin-codeine [facility]) 100-10 MG/5ML syrup Take 5 mLs by mouth 3 (three) times daily as needed for cough. 04/14/13  Yes Vernie Shanks, MD  KRILL OIL PO Take 1 tablet by mouth daily.   Yes Historical Provider, MD  lisinopril (PRINIVIL,ZESTRIL) 10 MG tablet Take 10 mg by mouth daily.    Yes Historical Provider, MD  pantoprazole (PROTONIX) 40 MG tablet Take 1 tablet (40  mg total) by mouth daily. 04/08/13  Yes Vernie Shanks, MD  ranitidine (ZANTAC) 300 MG capsule Take 1 capsule (300 mg total) by mouth daily. 04/08/13  Yes Vernie Shanks, MD   Allergies  Allergen Reactions  . Augmentin [Amoxicillin-Pot Clavulanate] Itching and Rash    FAMILY HISTORY:  History reviewed. No pertinent family history. SOCIAL HISTORY:  reports that he quit smoking about 15 months ago. His smoking use included Cigarettes. He has a 94 pack-year smoking history. He has never used smokeless tobacco. He reports that he drinks alcohol. He reports  that he does not use illicit drugs.  REVIEW OF SYSTEMS:   Constitutional: Negative for chills, weight loss, malaise/fatigue and diaphoresis. Positive for intermittent low grade fevers at home. HENT: Negative for hearing loss, ear pain, nosebleeds, sore throat, neck pain, tinnitus and ear discharge.  Positive for sinus pressure, drainage & bad taste in mouth Eyes: Negative for blurred vision, double vision, photophobia, pain, discharge and redness.  Respiratory: Negative for stridor.  Positive for cough with green thick sputum, blood streaked at times, pleuritic chest pain on R.   Cardiovascular: Negative for chest pain, palpitations, orthopnea, claudication, leg swelling and PND.  Gastrointestinal: Negative for nausea, vomiting, abdominal pain, diarrhea, constipation, blood in stool and melena. Positive for heartburn Genitourinary: Negative for dysuria, urgency, frequency, hematuria and flank pain.  Musculoskeletal: Negative for myalgias, back pain, joint pain and falls.  Skin: Negative for itching and rash.  Neurological: Negative for dizziness, tingling, tremors, sensory change, speech change, focal weakness, seizures, loss of consciousness, weakness and headaches.  Endo/Heme/Allergies: Negative for environmental allergies and polydipsia. Does not bruise/bleed easily.  SUBJECTIVE: Denies acute c/o's  VITAL SIGNS: Temp:  [97.3 F (36.3 C)-98 F (36.7 C)] 97.4 F (36.3 C) (02/27 1234) Pulse Rate:  [65-97] 65 (02/27 1234) Resp:  [16-20] 16 (02/27 1234) BP: (92-114)/(54-71) 108/54 mmHg (02/27 1234) SpO2:  [91 %-97 %] 97 % (02/27 1234) Weight:  [128 lb 4.9 oz (58.2 kg)] 128 lb 4.9 oz (58.2 kg) (02/26 2050)  PHYSICAL EXAMINATION: General:  wdwn adult male in NAD Neuro:  AAOx4, speech clear, MAE HEENT:  Mm pink/moist,no jvd, no supraclavicular LAN Cardiovascular:  s1s2 rrr, no m/r/g, no jvd Lungs:  resp's even/non-labored, R lower posterior / lateral with crackles, diminished air flow, L  essentially clear Abdomen:  Round/soft, bsx4 active Musculoskeletal:  No acute deformities, mild calf tenderness Skin:  Warm/dry, no edema    Recent Labs Lab 04/21/13 1223 04/21/13 1820 04/22/13 0406  NA 136* 135* 137  K 4.5 4.2 3.9  CL 97 97 102  CO2 25 20 22   BUN 16 15 17   CREATININE 1.07 1.04 0.96  GLUCOSE 112* 210* 216*    Recent Labs Lab 04/21/13 1223 04/21/13 1820 04/22/13 0406  HGB 13.4 12.2* 11.4*  HCT 38.6* 35.3* 33.4*  WBC 13.2* 12.3* 15.5*  PLT 274 265 251   Dg Chest 2 View  04/21/2013   CLINICAL DATA:  Cough and hemoptysis  EXAM: CHEST  2 VIEW  COMPARISON:  DG CHEST 2V dated 04/14/2013  FINDINGS: There has been an interval increase in the density of the confluent infiltrate in the right lower lobe consistent with progressive pneumonia. The right middle and upper lobes appear clear. The left lung appears clear. No significant pleural effusion is demonstrated. The cardiac silhouette is normal in size. The pulmonary vascularity is not engorged. The trachea is midline. There is soft tissue fullness in the right hilar region which may reflect lymphadenopathy.  This is not new.  IMPRESSION: Progressive increased density in the right lower lobe is present and is consistent with worsening atelectasis or pneumonia. Soft tissue fullness in the hilar region is suspicious for lymphadenopathy or mass. Chest CT scanning is recommended.   Electronically Signed   By: David  Martinique   On: 04/21/2013 13:48   Ct Angio Chest W/cm &/or Wo Cm  04/21/2013   CLINICAL DATA:  Hemoptysis, shortness of breath, right chest pain.  EXAM: CT ANGIOGRAPHY CHEST WITH CONTRAST  TECHNIQUE: Multidetector CT imaging of the chest was performed using the standard protocol during bolus administration of intravenous contrast. Multiplanar CT image reconstructions and MIPs were obtained to evaluate the vascular anatomy.  CONTRAST:  158mL OMNIPAQUE IOHEXOL 350 MG/ML SOLN  COMPARISON:  DG CHEST 2 VIEW dated 04/21/2013;  DG CHEST 2V dated 04/14/2013  FINDINGS: Main pulmonary artery is not enlarged. Nonocclusive segmental to subsegmental left upper lobe pulmonary embolus, bulky right hilar lymphadenopathy encases and narrows the right lower lobe pulmonary arteries, right middle lobe pulmonary vein. Nonocclusive pulmonary embolism right lower lobe segmental branches. Hilar lymphadenopathy encases the right middle lobe and lower lobe bronchi, incompletely and occludes the right lower lobe subsegmental bronchi. Patchy consolidation throughout the right lung, with interstitial prominence. More focal consolidation within the medial segment of the right lower lobe measuring 1.5 x 3.3 cm.  Mildly cystic 10 mm right peritracheal lymph nodes, a pretracheal 25 mm subcarinal 28 x 53 mm nodal conglomeration bulky right hilar lymphadenopathy. Severe centrilobular emphysema, with apical bullous changes. No pleural effusions. No left pulmonary nodule or mass.  The heart size is not enlarged, apparent right heart strain. Pericardium is nonsuspicious. Mild Coronary artery calcifications. Thoracic aorta is normal in course and caliber thickening vessels. Included view of the abdomen is nonsuspicious appearing bilateral acromioclavicular osteoarthrosis. .  Review of the MIP images confirms the above findings.  IMPRESSION: Nonocclusive left upper lobe, right lower lobe pulmonary emboli. Right heart strain.  Bulky mediastinal and right hilar lymphadenopathy, encasing the of pulmonary arteries, pulmonary veins and bronchi, completely effacing right lower lobe segmental bronchi. Extensive consolidation in the right lung suggests postobstructive pneumonia, with interstitial prominence which may reflect superimposed lymphangitic spread of infection or neoplasm. Constellation of findings are highly concerning for neoplastic process, with consolidated focus in the right lung base, unclear whether this reflects lung primary. Additional consideration is lymphoma.  Findings less likely reflect autoimmune disorders such as sarcoid.  Critical Value/emergent results were called by telephone at the time of interpretation on 04/21/2013 at 4:00 PM to [facility] PA, who verbally acknowledged these results.   Electronically Signed   By: Elon Alas   On: 04/21/2013 16:04    ASSESSMENT / PLAN:  Acute Hypoxic Respiratory Failure Non-Occlusive LUL, RLL PE Mediastinal, R Hilar Lymphadenopathy RLL Post-Obstructive PNA  Plan: - Continue Heparin gtt for PE - NO ORAL ANTICOAGULATION  - LE Venous Dopplers today.   If dopplers are negative, will proceed with bronchoscopy on Tuesday morning 3/3 for biopsy.  If dopplers are positive, then will first get IVC filter placed on Monday 3/2, and proceed with bronchoscopy with biopsy on Tuesday 3/3. - patient does not want wife to know about diagnosis / potential diagnosis - message left with bronch scheduler for potential bronch on 3/3 - d/c SCD's (on heparin gtt & until dvt ruled out)  Noe Gens, NP-C [place] Pgr: [phone] or [phone]  Plan as above, bronchodilators orders in place, abx as ordered.  No oral anti-coagulants.  Will need bronchoscopy scheduled for next week after 72 hours of anti-coagulation.  Likely Tuesday morning.  PCCM will follow with you.  Patient seen and examined, agree with above note.  I dictated the care and orders written for this patient under my direction.  Rush Farmer, MD [phone]  04/22/2013, 1:53 PM

## 2013-04-22 NOTE — Progress Notes (Addendum)
[facility] PROGRESS NOTE  Ricky Villanueva IWP:[id number] DOB: 10-05-1950 DOA: 04/21/2013 PCP: [name], MD  Brief narrative: 63 year old male with past medical history of hypertension, recent pneumonia treated with cefdinir for 1 week who presented to [facility] ED 04/21/2013 with progressively worsening shortness of breath and cough which now he noticed has streaks of blood In it. He also has chest tightness associated with cough straining. Pt also reported fever, chills and fatigue at home. He said antibiotic at home did not help with symptomatic relief significantly. No abdominal pain, nausea or vomiting. No lightheadedness or loss of consciousness.  In ED, BP was 116/61, HR 85, Tmax 98.1 F and oxygen saturation 91% on 2 L nasal canula. Blood work revealed leukocytosis of 13 and sodium of 136 otherwise unremarkable. CXR showed progressive increased density in the right lower lobe consistent with worsening atelectasis or pneumonia. Seen also was soft tissue fullness in the hilar region is suspicious for lymphadenopathy or mass which was confirmed on CT angio chest. Pt was also found to have pulmonary embolism.  Assessment and Plan:   Principal Problem:  Respiratory failure with hypoxia  - secondary to pneumonia, questionable postobstructive pneumonia  - continue vanco and cefepime  - follow up blood culture results, resp culture results, legionella results; strep pneumonia and influenza negative - oxygen support via nasal canula to keep O2 saturation above 90%  Active Problems:  Questionable lung mass, right lung base - will see with pulmonary what is the best way to approach this to obtain biopsy HCAP (healthcare-associated pneumonia)  - treat with vanco and cefepime and follow up blood work as above associated pneumonia order set  Pulmonary embolism - started Heparin drip Hemoptysis  - likely due to cough and straining related to cough  - continue to monitor for other episodes of  hemoptysis  GERD (gastroesophageal reflux disease)  - continue protonix and Pepcid; pt is on 2 meds for GERD at home  HTN (hypertension)  - continue lisinopril    Code Status: Full  Family Communication: Pt at bedside  Disposition Plan: Admit for further evaluation   Leisa Lenz, MD  [facility] Pager [phone]  If 7PM-7AM, please contact night-coverage [email or web address] Password [facility] 04/22/2013, 8:49 AM   LOS: 1 day   Consultants:  Pulmonary   Procedures:  None   Antibiotics:  Vanco 04/21/2013 -->  Cefepime 04/21/2013 -->  HPI/Subjective: No acute overnight events.   Objective: Filed Vitals:   04/22/13 0435 04/22/13 0530 04/22/13 0744 04/22/13 0829  BP: 106/57   92/57  Pulse: 88   78  Temp: 97.3 F (36.3 C)   98 F (36.7 C)  TempSrc: Oral   Oral  Resp: 16   18  Height:      Weight:      SpO2: 92% 94% 91% 96%    Intake/Output Summary (Last 24 hours) at 04/22/13 0849 Last data filed at 04/22/13 0600  Gross per 24 hour  Intake 1637.5 ml  Output      0 ml  Net 1637.5 ml    Exam:   General:  Pt is alert, follows commands appropriately, not in acute distress  Cardiovascular: Regular rate and rhythm, S1/S2, no murmurs, no rubs, no gallops  Respiratory: Clear to auscultation bilaterally, no wheezing, no crackles, no rhonchi  Abdomen: Soft, non tender, non distended, bowel sounds present, no guarding  Extremities: No edema, pulses DP and PT palpable bilaterally  Neuro: Grossly nonfocal  Data Reviewed: Basic Metabolic Panel:  Recent Labs Lab 04/21/13 1223 04/21/13 1820 04/22/13 0406  NA 136* 135* 137  K 4.5 4.2 3.9  CL 97 97 102  CO2 25 20 22   GLUCOSE 112* 210* 216*  BUN 16 15 17   CREATININE 1.07 1.04 0.96  CALCIUM 9.2 8.5 8.7  MG  --  1.8  --   PHOS  --  2.3  --    Liver Function Tests:  Recent Labs Lab 04/21/13 1223 04/21/13 1820 04/22/13 0406  AST 18 17 14   ALT 14 13 10   ALKPHOS 87 78 70  BILITOT 0.8 0.4 0.3  PROT  7.2 6.5 6.1  ALBUMIN 3.3* 2.8* 2.6*   No results found for this basename: LIPASE, AMYLASE,  in the last 168 hours No results found for this basename: AMMONIA,  in the last 168 hours CBC:  Recent Labs Lab 04/21/13 1223 04/21/13 1820 04/22/13 0406  WBC 13.2* 12.3* 15.5*  NEUTROABS 10.5* 11.8*  --   HGB 13.4 12.2* 11.4*  HCT 38.6* 35.3* 33.4*  MCV 92.1 93.9 93.0  PLT 274 265 251   Cardiac Enzymes:  Recent Labs Lab 04/21/13 1224  TROPONINI <0.30   BNP: No components found with this basename: POCBNP,  CBG:  Recent Labs Lab 04/22/13 0842  GLUCAP 156*    No results found for this or any previous visit (from the past 240 hour(s)).   Studies: Dg Chest 2 View 04/21/2013    IMPRESSION: Progressive increased density in the right lower lobe is present and is consistent with worsening atelectasis or pneumonia. Soft tissue fullness in the hilar region is suspicious for lymphadenopathy or mass. Chest CT scanning is recommended.   Ct Angio Chest W/cm &/or Wo Cm 04/21/2013    IMPRESSION: Nonocclusive left upper lobe, right lower lobe pulmonary emboli. Right heart strain.  Bulky mediastinal and right hilar lymphadenopathy, encasing the of pulmonary arteries, pulmonary veins and bronchi, completely effacing right lower lobe segmental bronchi. Extensive consolidation in the right lung suggests postobstructive pneumonia, with interstitial prominence which may reflect superimposed lymphangitic spread of infection or neoplasm. Constellation of findings are highly concerning for neoplastic process, with consolidated focus in the right lung base, unclear whether this reflects lung primary. Additional consideration is lymphoma. Findings less likely reflect autoimmune disorders such as sarcoid.     Scheduled Meds: . albuterol  5 mg Nebulization Q4H  . ceFEPime (MAXIPIME) IV  2 g Intravenous Q24H  . cholecalciferol  2,000 Units Oral Daily  . famotidine  20 mg Oral BID  . ipratropium  0.5 mg  Nebulization Q4H  . lisinopril  10 mg Oral Daily  . pantoprazole  40 mg Oral Daily  . vancomycin  750 mg Intravenous Q12H   Continuous Infusions: . sodium chloride 1,000 mL (04/22/13 0754)  . heparin 1,200 Units/hr (04/22/13 0534)

## 2013-04-23 LAB — HEPARIN LEVEL (UNFRACTIONATED)
Heparin Unfractionated: 0.13 IU/mL — ABNORMAL LOW (ref 0.30–0.70)
Heparin Unfractionated: 0.38 IU/mL (ref 0.30–0.70)
Heparin Unfractionated: 0.34 IU/mL (ref 0.30–0.70)

## 2013-04-23 LAB — CBC
HEMATOCRIT: 32.8 % — AB (ref 39.0–52.0)
Hemoglobin: 11.3 g/dL — ABNORMAL LOW (ref 13.0–17.0)
MCH: 32.4 pg (ref 26.0–34.0)
MCHC: 34.5 g/dL (ref 30.0–36.0)
MCV: 94 fL (ref 78.0–100.0)
Platelets: 277 10*3/uL (ref 150–400)
RBC: 3.49 MIL/uL — ABNORMAL LOW (ref 4.22–5.81)
RDW: 12.2 % (ref 11.5–15.5)
WBC: 13.7 10*3/uL — ABNORMAL HIGH (ref 4.0–10.5)

## 2013-04-23 MED ORDER — HEPARIN (PORCINE) IN NACL 100-0.45 UNIT/ML-% IJ SOLN
1500.0000 [IU]/h | INTRAMUSCULAR | Status: DC
  Administered 2013-04-23: 1500 [IU]/h via INTRAVENOUS
  Filled 2013-04-23 (×3): qty 250

## 2013-04-23 MED ORDER — HEPARIN BOLUS VIA INFUSION
2000.0000 [IU] | Freq: Once | INTRAVENOUS | Status: AC
  Administered 2013-04-23: 2000 [IU] via INTRAVENOUS
  Filled 2013-04-23: qty 2000

## 2013-04-23 MED ORDER — IPRATROPIUM-ALBUTEROL 0.5-2.5 (3) MG/3ML IN SOLN
3.0000 mL | RESPIRATORY_TRACT | Status: DC
  Administered 2013-04-23 – 2013-04-24 (×5): 3 mL via RESPIRATORY_TRACT
  Filled 2013-04-23 (×5): qty 3

## 2013-04-23 NOTE — Progress Notes (Signed)
ANTICOAGULATION CONSULT NOTE - Follow Up Consult  Pharmacy Consult for heparin Indication: pulmonary embolus  Labs:  Recent Labs  04/21/13 1223 04/21/13 1224 04/21/13 1820  04/22/13 0406 04/22/13 1150 04/23/13 0446 04/23/13 1326  HGB 13.4  --  12.2*  --  11.4*  --  11.3*  --   HCT 38.6*  --  35.3*  --  33.4*  --  32.8*  --   PLT 274  --  265  --  251  --  277  --   APTT  --   --  75*  --   --   --   --   --   LABPROT  --   --  14.7  --   --   --   --   --   INR  --   --  1.17  --   --   --   --   --   HEPARINUNFRC  --   --   --   < > 0.27* 0.57 0.13* 0.38  CREATININE 1.07  --  1.04  --  0.96  --   --   --   TROPONINI  --  <0.30  --   --   --   --   --   --   < > = values in this interval not displayed.  Assessment: 63yo male was subtherapeutic on heparin after one level at goal and dose adjusted.  Follow up HL is 0.38 and on the lower range of therapeutic.  Goal of Therapy:  Heparin level 0.3-0.7 units/ml   Plan:  Will continue Heparin at 1400 units/hr and check level in 6hr.  Jeronimo Norma, PharmD Clinical Pharmacist Resident Pager: [phone]   04/23/2013,2:18 PM

## 2013-04-23 NOTE — Progress Notes (Signed)
[facility]                                                                              Patient Demographics  Ricky Villanueva, is a 63 y.o. male, DOB - 11-18-1950, GYB:[id number]  Admit date - 04/21/2013   Admitting Physician Robbie Lis, MD  Outpatient Primary MD for the patient is [name], MD  LOS - 2   Chief Complaint  Patient presents with  . Hemoptysis        Assessment & Plan   Respiratory failure with hypoxia  -Multifactorial causes: pneumonia, questionable postobstructive pneumonia, PE -CXR: progressive RLL airspace disease and soft tissue fullness in right hilar region -CT Chest: non-occlusive PE in LUL and RLL                       -continue vancomycin and cefepime, duoneb treatments, supplemental oxygen to maintain saturations above 92% -Blood cultures negative to date, strep pneumonia and influenza negative   Questionable lung mass, right lung base  -CT Chest: Lytic focus in right lung base -Neurology consulted and planning bronchoscopy with biopsy for 04/26/2013 -Patient has extensive smoking history  HCAP (healthcare-associated pneumonia)  -Treatment and plan as stated above  Pulmonary embolism  -Currently on heparin drip -Pending lower ext duplex -If positive, IVC filter to be placed on 04/25/2013 (per pulmonology notes) -No oral anticoagulation at this time, per pulmonololgy  Hemoptysis  -likely due to cough and straining related to cough  -continue to monitor for other episodes of hemoptysis   GERD (gastroesophageal reflux disease)  -Continue protonix and Pepcid (home medications)  Hypertension -Continue lisinopril   Code Status: Full  Family Communication: None at bedside.  Patient does not want his wife to know about his blood clot or lung mass.  Disposition Plan: Admitted  Time Spent in minutes   35 minutes  Procedures None  Consults  Pulmonology  DVT Prophylaxis  Heparin   Lab Results  Component Value Date   PLT 277  04/23/2013    Medications  Scheduled Meds: . ceFEPime (MAXIPIME) IV  2 g Intravenous Q12H  . cholecalciferol  2,000 Units Oral Daily  . famotidine  20 mg Oral BID  . ipratropium  0.5 mg Nebulization Q4H  . ipratropium-albuterol  3 mL Nebulization Q4H  . lisinopril  10 mg Oral Daily  . pantoprazole  40 mg Oral Daily  . vancomycin  750 mg Intravenous Q12H   Continuous Infusions: . sodium chloride 1,000 mL (04/23/13 0128)  . heparin 1,400 Units/hr (04/23/13 0801)   PRN Meds:.acetaminophen, acetaminophen, guaiFENesin-codeine, HYDROcodone-acetaminophen, ondansetron (ZOFRAN) IV, ondansetron  Antibiotics    Anti-infectives   Start     Dose/Rate Route Frequency Ordered Stop   04/22/13 1800  ceFEPIme (MAXIPIME) 2 g in dextrose 5 % 50 mL IVPB     2 g 100 mL/hr over 30 Minutes Intravenous Every 12 hours 04/22/13 1516     04/21/13 2200  aztreonam (AZACTAM) 2 g in dextrose 5 % 50 mL IVPB  Status:  Discontinued     2 g 100 mL/hr over 30 Minutes Intravenous 3 times per day 04/21/13 1453 04/21/13 1516   04/21/13 1600  ceFEPIme (MAXIPIME) 2 g in dextrose 5 % 50 mL IVPB  Status:  Discontinued     2 g 100 mL/hr over 30 Minutes Intravenous Every 24 hours 04/21/13 1516 04/22/13 1516   04/21/13 1500  vancomycin (VANCOCIN) IVPB 750 mg/150 ml premix     750 mg 150 mL/hr over 60 Minutes Intravenous Every 12 hours 04/21/13 1425     04/21/13 1430  aztreonam (AZACTAM) 2 g in dextrose 5 % 50 mL IVPB  Status:  Discontinued     2 g 100 mL/hr over 30 Minutes Intravenous  Once 04/21/13 1412 04/21/13 1615        Subjective:   Ricky Villanueva seen and examined today.  Patient still complains of shortness of breath and cough.  No more chest pain.  He does not want his wife to know about the blood clot yet or anything else at this time.    Objective:   Filed Vitals:   04/22/13 1234 04/22/13 1706 04/22/13 2020 04/23/13 0438  BP: 108/54 117/70 111/74 141/87  Pulse: 65 79 93 93  Temp: 97.4 F (36.3 C)  97.6 F (36.4 C) 97.3 F (36.3 C) 98.1 F (36.7 C)  TempSrc: Oral Oral Oral Oral  Resp: 16 15 20 16   Height:      Weight:   62.7 kg (138 lb 3.7 oz)   SpO2: 97% 96% 96% 91%    Wt Readings from Last 3 Encounters:  04/22/13 62.7 kg (138 lb 3.7 oz)  04/18/13 59.693 kg (131 lb 9.6 oz)  04/14/13 59.784 kg (131 lb 12.8 oz)     Intake/Output Summary (Last 24 hours) at 04/23/13 0827 Last data filed at 04/23/13 0700  Gross per 24 hour  Intake 2392.67 ml  Output      0 ml  Net 2392.67 ml    Exam  General: Well developed, well nourished, NAD, appears stated age  HEENT: NCAT, PERRLA, EOMI, Anicteic Sclera, mucous membranes moist.  Neck: Supple, no JVD, no masses  Cardiovascular: S1 S2 auscultated, no rubs, murmurs or gallops. Regular rate and rhythm.  Respiratory: Coarse breath sounds, scattered crackles a few rhonchi  Abdomen: Soft, nontender, nondistended, + bowel sounds  Extremities: warm dry without cyanosis clubbing or edema  Neuro: AAOx3, cranial nerves grossly intact. Strength 5/5 in patient's upper and lower extremities bilaterally  Skin: Without rashes exudates or nodules  Psych: Normal affect and demeanor with intact judgement and insight  Data Review   Micro Results Recent Results (from the past 240 hour(s))  CULTURE, BLOOD (ROUTINE X 2)     Status: None   Collection Time    04/21/13  2:40 PM      Result Value Ref Range Status   Specimen Description BLOOD ARM LEFT   Final   Special Requests BOTTLES DRAWN AEROBIC AND ANAEROBIC 5CC   Final   Culture  Setup Time     Final   Value: 04/21/2013 20:43     Performed at [facility]   Culture     Final   Value:        BLOOD CULTURE RECEIVED NO GROWTH TO DATE CULTURE WILL BE HELD FOR 5 DAYS BEFORE ISSUING A FINAL NEGATIVE REPORT     Performed at [facility]   Report Status PENDING   Incomplete  CULTURE, BLOOD (ROUTINE X 2)     Status: None   Collection Time    04/21/13  2:50 PM      Result  Value Ref Range  Status   Specimen Description BLOOD HAND LEFT   Final   Special Requests BOTTLES DRAWN AEROBIC AND ANAEROBIC 5CC   Final   Culture  Setup Time     Final   Value: 04/21/2013 20:42     Performed at [facility]   Culture     Final   Value:        BLOOD CULTURE RECEIVED NO GROWTH TO DATE CULTURE WILL BE HELD FOR 5 DAYS BEFORE ISSUING A FINAL NEGATIVE REPORT     Performed at [facility]   Report Status PENDING   Incomplete    Radiology Reports Dg Chest 2 View  04/21/2013   CLINICAL DATA:  Cough and hemoptysis  EXAM: CHEST  2 VIEW  COMPARISON:  DG CHEST 2V dated 04/14/2013  FINDINGS: There has been an interval increase in the density of the confluent infiltrate in the right lower lobe consistent with progressive pneumonia. The right middle and upper lobes appear clear. The left lung appears clear. No significant pleural effusion is demonstrated. The cardiac silhouette is normal in size. The pulmonary vascularity is not engorged. The trachea is midline. There is soft tissue fullness in the right hilar region which may reflect lymphadenopathy. This is not new.  IMPRESSION: Progressive increased density in the right lower lobe is present and is consistent with worsening atelectasis or pneumonia. Soft tissue fullness in the hilar region is suspicious for lymphadenopathy or mass. Chest CT scanning is recommended.   Electronically Signed   By: David  Martinique   On: 04/21/2013 13:48   Dg Chest 2 View  04/18/2013   CLINICAL DATA:  Cough  EXAM: CHEST  2 VIEW  COMPARISON:  02/05/2004  FINDINGS: There is dense consolidation in the right lower lobe consistent with pneumonia. Small area of mild consolidation is noted at the base of the right middle lobe.  Lungs are hyperexpanded but otherwise clear. No pleural effusion or pneumothorax.  Cardiac silhouette is normal in size. There is fullness of the right hilum suggesting reactive adenopathy.  IMPRESSION: 1. Right lower lobe pneumonia  with a small focus of right middle lobe infiltrate. Fullness of the right hilum suggests reactive right hilar adenopathy.   Electronically Signed   By: Lajean Manes M.D.   On: 04/18/2013 08:13   Ct Angio Chest W/cm &/or Wo Cm  04/21/2013   CLINICAL DATA:  Hemoptysis, shortness of breath, right chest pain.  EXAM: CT ANGIOGRAPHY CHEST WITH CONTRAST  TECHNIQUE: Multidetector CT imaging of the chest was performed using the standard protocol during bolus administration of intravenous contrast. Multiplanar CT image reconstructions and MIPs were obtained to evaluate the vascular anatomy.  CONTRAST:  144mL OMNIPAQUE IOHEXOL 350 MG/ML SOLN  COMPARISON:  DG CHEST 2 VIEW dated 04/21/2013; DG CHEST 2V dated 04/14/2013  FINDINGS: Main pulmonary artery is not enlarged. Nonocclusive segmental to subsegmental left upper lobe pulmonary embolus, bulky right hilar lymphadenopathy encases and narrows the right lower lobe pulmonary arteries, right middle lobe pulmonary vein. Nonocclusive pulmonary embolism right lower lobe segmental branches. Hilar lymphadenopathy encases the right middle lobe and lower lobe bronchi, incompletely and occludes the right lower lobe subsegmental bronchi. Patchy consolidation throughout the right lung, with interstitial prominence. More focal consolidation within the medial segment of the right lower lobe measuring 1.5 x 3.3 cm.  Mildly cystic 10 mm right peritracheal lymph nodes, a pretracheal 25 mm subcarinal 28 x 53 mm nodal conglomeration bulky right hilar lymphadenopathy. Severe centrilobular emphysema, with apical bullous changes. No  pleural effusions. No left pulmonary nodule or mass.  The heart size is not enlarged, apparent right heart strain. Pericardium is nonsuspicious. Mild Coronary artery calcifications. Thoracic aorta is normal in course and caliber thickening vessels. Included view of the abdomen is nonsuspicious appearing bilateral acromioclavicular osteoarthrosis. .  Review of the MIP  images confirms the above findings.  IMPRESSION: Nonocclusive left upper lobe, right lower lobe pulmonary emboli. Right heart strain.  Bulky mediastinal and right hilar lymphadenopathy, encasing the of pulmonary arteries, pulmonary veins and bronchi, completely effacing right lower lobe segmental bronchi. Extensive consolidation in the right lung suggests postobstructive pneumonia, with interstitial prominence which may reflect superimposed lymphangitic spread of infection or neoplasm. Constellation of findings are highly concerning for neoplastic process, with consolidated focus in the right lung base, unclear whether this reflects lung primary. Additional consideration is lymphoma. Findings less likely reflect autoimmune disorders such as sarcoid.  Critical Value/emergent results were called by telephone at the time of interpretation on 04/21/2013 at 4:00 PM to [facility] PA, who verbally acknowledged these results.   Electronically Signed   By: Elon Alas   On: 04/21/2013 16:04    CBC  Recent Labs Lab 04/21/13 1223 04/21/13 1820 04/22/13 0406 04/23/13 0446  WBC 13.2* 12.3* 15.5* 13.7*  HGB 13.4 12.2* 11.4* 11.3*  HCT 38.6* 35.3* 33.4* 32.8*  PLT 274 265 251 277  MCV 92.1 93.9 93.0 94.0  MCH 32.0 32.4 31.8 32.4  MCHC 34.7 34.6 34.1 34.5  RDW 11.6 11.7 11.8 12.2  LYMPHSABS 1.1 0.3*  --   --   MONOABS 1.6* 0.2  --   --   EOSABS 0.0 0.0  --   --   BASOSABS 0.0 0.0  --   --     Chemistries   Recent Labs Lab 04/21/13 1223 04/21/13 1820 04/22/13 0406  NA 136* 135* 137  K 4.5 4.2 3.9  CL 97 97 102  CO2 25 20 22   GLUCOSE 112* 210* 216*  BUN 16 15 17   CREATININE 1.07 1.04 0.96  CALCIUM 9.2 8.5 8.7  MG  --  1.8  --   AST 18 17 14   ALT 14 13 10   ALKPHOS 87 78 70  BILITOT 0.8 0.4 0.3   ------------------------------------------------------------------------------------------------------------------ estimated creatinine clearance is 71.7 ml/min (by C-G formula based  on Cr of 0.96). ------------------------------------------------------------------------------------------------------------------ No results found for this basename: HGBA1C,  in the last 72 hours ------------------------------------------------------------------------------------------------------------------ No results found for this basename: CHOL, HDL, LDLCALC, TRIG, CHOLHDL, LDLDIRECT,  in the last 72 hours ------------------------------------------------------------------------------------------------------------------  Recent Labs  04/21/13 1820  TSH 0.590   ------------------------------------------------------------------------------------------------------------------ No results found for this basename: VITAMINB12, FOLATE, FERRITIN, TIBC, IRON, RETICCTPCT,  in the last 72 hours  Coagulation profile  Recent Labs Lab 04/21/13 1820  INR 1.17    No results found for this basename: DDIMER,  in the last 72 hours  Cardiac Enzymes  Recent Labs Lab 04/21/13 1224  TROPONINI <0.30   ------------------------------------------------------------------------------------------------------------------ No components found with this basename: POCBNP,     Markeda Narvaez D.O. on 04/23/2013 at 8:27 AM  Between 7am to 7pm - Pager - [phone]  After 7pm go to [email or web address] - password [email or web address]  And look for the night coverage person covering for me after hours  [facility] Office  [phone]

## 2013-04-23 NOTE — Progress Notes (Signed)
ANTICOAGULATION CONSULT NOTE - Follow Up Consult  Pharmacy Consult for heparin Indication: pulmonary embolus  Labs:  Recent Labs  04/21/13 1223 04/21/13 1224 04/21/13 1820  04/22/13 0406 04/22/13 1150 04/23/13 0446  HGB 13.4  --  12.2*  --  11.4*  --  11.3*  HCT 38.6*  --  35.3*  --  33.4*  --  32.8*  PLT 274  --  265  --  251  --  277  APTT  --   --  75*  --   --   --   --   LABPROT  --   --  14.7  --   --   --   --   INR  --   --  1.17  --   --   --   --   HEPARINUNFRC  --   --   --   < > 0.27* 0.57 0.13*  CREATININE 1.07  --  1.04  --  0.96  --   --   TROPONINI  --  <0.30  --   --   --   --   --   < > = values in this interval not displayed.  Assessment: 63yo male now subtherapeutic on heparin after one level at goal.  Goal of Therapy:  Heparin level 0.3-0.7 units/ml   Plan:  Will give heparin 2000 units bolus and increase gtt by 3 units/kg/hr to 1400 units/hr and check level in 6hr.  Wynona Neat, PharmD, BCPS  04/23/2013,6:36 AM

## 2013-04-23 NOTE — ED Provider Notes (Signed)
Medical screening examination/treatment/procedure(s) were conducted as a shared visit with non-physician practitioner(s) and myself.  I personally evaluated the patient during the encounter.   EKG Interpretation   Date/Time:  Thursday April 21 2013 12:57:12 EST Ventricular Rate:  94 PR Interval:  141 QRS Duration: 80 QT Interval:  332 QTC Calculation: 415 R Axis:   80 Text Interpretation:  Sinus rhythm Probable anteroseptal infarct, old No  significant change was found Confirmed by [facility]  MD, TREY ([id number]) on  04/21/2013 2:25:08 PM      63 yo male with cough for a few weeks, not improved with outpatient abx.  Now has some blood tinged sputum and worsening SOB.  On my exam, thin male, mild increased WOB, moving decent air, mild wheezes, heart sounds normal with slightly tachycardic rate, regular rhythm.  Workup revealed RLL pneumonia and small PE.  CT also showed hilar lymphadenopathy.  These results communicated to patient by me, including concern for malignancy.  Heparin gtt.  Vanco and Cefepime. Admit to medicine.   Clinical Impression: 1. HCAP (healthcare-associated pneumonia)   2. GERD (gastroesophageal reflux disease)   3. HTN (hypertension)   4. Respiratory failure with hypoxia   5. Acute pulmonary embolism   6. Lung mass       Houston Siren III, MD 04/23/13 1011

## 2013-04-23 NOTE — Progress Notes (Signed)
Name: Ricky Villanueva  MRN: [id number]  DOB: 24-Oct-1950  ADMISSION DATE: 04/21/2013  CONSULTATION DATE: 04/22/13  REFERRING MD : Dr. Charlies Silvers  PRIMARY SERVICE: [facility]  CHIEF COMPLAINT: PE, ? Lung Mass   BRIEF PATIENT DESCRIPTION: 63 y/o M admitted 2/26 with progressive SOB & Cough. Work up demonstrated CXR with progressive RLL airspace disease and soft tissue fullness in R hilar region. CT Chest obtained demonstrating non-occlusive LUL, RLL PE. PCCM consulted for evaluation 2/27   SIGNIFICANT EVENTS / STUDIES:  2/26 - admit with progressive SOB, cough. CT Chest>>>non-occlusive LUL, RLL PE, R hilar lymphadenopathy, consolidation in RLL   CULTURES:  BCx2 2/26>>>   ANTIBIOTICS:  Cefepime 2/26>>>  Vanco 2/26>>>   HISTORY OF PRESENT ILLNESS: 63 y/o M, former heavy smoker (smoked 2ppd, for almost 47 years), with PMH of HTN, HLD, GERD, and COPD who presented to [facility] on 2/26 with progressive SOB & Cough. Patient was seen by PCP on 2/19 with a two week history of cough, chest congestion and intermittent fevers. He was treated with clarithromycin, rocephin & guaifenesin. He followed up with PCP on 2/23 with little change in symptoms, CXR at that time noted RLL PNA with RML hilar reactive adenopathy. Omnicef was added to his regimen. He continued to have worsening of symptoms with persistent productive cough, blood streaked sputum and right sided pleuritic chest pain. He presented to ER for further care. Work up demonstrated CXR with progressive RLL airspace disease and soft tissue fullness in R hilar region. CT Chest obtained demonstrating non-occlusive LUL, RLL PE. PCCM consulted for evaluation 2/27.   SUBJECTIVE: feels better today, but still gets very sob with activity. Has not had LE venous dopplers.  VITAL SIGNS:  Temp: [97.3 F (36.3 C)-98 F (36.7 C)] 97.4 F (36.3 C) (02/27 1234)  Pulse Rate: [65-97] 65 (02/27 1234)  Resp: [16-20] 16 (02/27 1234)  BP: (92-114)/(54-71) 108/54 mmHg (02/27 1234)   SpO2: [91 %-97 %] 97 % (02/27 1234)  Weight: [128 lb 4.9 oz (58.2 kg)] 128 lb 4.9 oz (58.2 kg) (02/26 2050)    PHYSICAL EXAMINATION:  General: wdwn adult male in NAD  Neuro: AAO, moves all 4 HEENT: nose without purulence or d/c, no LN or TMG Cardiovascular: s1s2 rrr, no m/r/g Lungs:  Scattered crackles, a few rhonchi, no wheezing.  Abdomen: soft and nt, bs+ Musculoskeletal: No acute deformities, no edema   ASSESSMENT / PLAN:  Acute Hypoxic Respiratory Failure  Non-Occlusive LUL, RLL PE  Mediastinal, R Hilar Lymphadenopathy  RLL Post-Obstructive PNA    Plan:  - Continue Heparin gtt for PE  - NO ORAL ANTICOAGULATION  - LE Venous Dopplers today. If dopplers are negative, will proceed with bronchoscopy on Tuesday morning 3/3 for biopsy. If dopplers are positive, then will first get IVC filter placed on Monday 3/2, and proceed with bronchoscopy with biopsy on Tuesday 3/3.  - patient does not want wife to know about diagnosis / potential diagnosis  - message left with bronch scheduler for potential bronch on 3/3

## 2013-04-23 NOTE — ED Provider Notes (Signed)
Medical screening examination/treatment/procedure(s) were conducted as a shared visit with non-physician practitioner(s) and myself.  I personally evaluated the patient during the encounter.   EKG Interpretation   Date/Time:  Thursday April 21 2013 12:57:12 EST Ventricular Rate:  94 PR Interval:  141 QRS Duration: 80 QT Interval:  332 QTC Calculation: 415 R Axis:   80 Text Interpretation:  Sinus rhythm Probable anteroseptal infarct, old No  significant change was found Confirmed by [facility]  MD, TREY ([id number]) on  04/21/2013 2:25:08 PM        Houston Siren III, MD 04/23/13 1012

## 2013-04-23 NOTE — Progress Notes (Signed)
ANTICOAGULATION CONSULT NOTE - Follow Up Consult  Pharmacy Consult for Heparin Indication: bilateral PE  Allergies  Allergen Reactions  . Augmentin [Amoxicillin-Pot Clavulanate] Itching and Rash    Patient Measurements: Height: 5\' 7"  (170.2 cm) Weight: 138 lb 3.7 oz (62.7 kg) IBW/kg (Calculated) : 66.1 Heparin Dosing Weight: 62kg  Vital Signs: Temp: 100.8 F (38.2 C) (02/28 2038) Temp src: Oral (02/28 2038) BP: 147/81 mmHg (02/28 2038) Pulse Rate: 103 (02/28 2038)  Labs:  Recent Labs  04/21/13 1223 04/21/13 1224 04/21/13 1820  04/22/13 0406  04/23/13 0446 04/23/13 1326 04/23/13 2009  HGB 13.4  --  12.2*  --  11.4*  --  11.3*  --   --   HCT 38.6*  --  35.3*  --  33.4*  --  32.8*  --   --   PLT 274  --  265  --  251  --  277  --   --   APTT  --   --  75*  --   --   --   --   --   --   LABPROT  --   --  14.7  --   --   --   --   --   --   INR  --   --  1.17  --   --   --   --   --   --   HEPARINUNFRC  --   --   --   < > 0.27*  < > 0.13* 0.38 0.34  CREATININE 1.07  --  1.04  --  0.96  --   --   --   --   TROPONINI  --  <0.30  --   --   --   --   --   --   --   < > = values in this interval not displayed.  Estimated Creatinine Clearance: 71.7 ml/min (by C-G formula based on Cr of 0.96).   Medications:  Heparin @ 1400 units/hr  Assessment: 62yom continues on heparin for bilateral PE (per CT on 2/26). Heparin level is at goal but again on the low end. Will increase rate slightly. LE dopplers pending to rule out DVT.  Goal of Therapy:  Heparin level 0.3-0.7 units/ml Monitor platelets by anticoagulation protocol: Yes   Plan:  1) Increase heparin to 1500 units/hr 2) Follow up heparin level, CBC in AM  Deboraha Sprang 04/23/2013,9:01 PM

## 2013-04-24 DIAGNOSIS — I2699 Other pulmonary embolism without acute cor pulmonale: Secondary | ICD-10-CM

## 2013-04-24 LAB — HEPARIN LEVEL (UNFRACTIONATED): Heparin Unfractionated: 0.33 IU/mL (ref 0.30–0.70)

## 2013-04-24 LAB — CBC
HCT: 32.8 % — ABNORMAL LOW (ref 39.0–52.0)
Hemoglobin: 11 g/dL — ABNORMAL LOW (ref 13.0–17.0)
MCH: 31.3 pg (ref 26.0–34.0)
MCHC: 33.5 g/dL (ref 30.0–36.0)
MCV: 93.2 fL (ref 78.0–100.0)
Platelets: 284 10*3/uL (ref 150–400)
RBC: 3.52 MIL/uL — ABNORMAL LOW (ref 4.22–5.81)
RDW: 12.1 % (ref 11.5–15.5)
WBC: 10.4 10*3/uL (ref 4.0–10.5)

## 2013-04-24 LAB — VANCOMYCIN, TROUGH: VANCOMYCIN TR: 10.6 ug/mL (ref 10.0–20.0)

## 2013-04-24 MED ORDER — IPRATROPIUM-ALBUTEROL 0.5-2.5 (3) MG/3ML IN SOLN
3.0000 mL | Freq: Four times a day (QID) | RESPIRATORY_TRACT | Status: DC
  Administered 2013-04-24 – 2013-04-25 (×6): 3 mL via RESPIRATORY_TRACT
  Filled 2013-04-24 (×6): qty 3

## 2013-04-24 MED ORDER — VANCOMYCIN HCL IN DEXTROSE 1-5 GM/200ML-% IV SOLN
1000.0000 mg | Freq: Two times a day (BID) | INTRAVENOUS | Status: DC
  Administered 2013-04-25 – 2013-04-27 (×6): 1000 mg via INTRAVENOUS
  Filled 2013-04-24 (×7): qty 200

## 2013-04-24 MED ORDER — HEPARIN (PORCINE) IN NACL 100-0.45 UNIT/ML-% IJ SOLN
1800.0000 [IU]/h | INTRAMUSCULAR | Status: AC
  Administered 2013-04-24 – 2013-04-25 (×2): 1600 [IU]/h via INTRAVENOUS
  Administered 2013-04-25 (×2): 1800 [IU]/h via INTRAVENOUS
  Filled 2013-04-24 (×4): qty 250

## 2013-04-24 NOTE — Progress Notes (Signed)
[facility]                                                                              Patient Demographics  Ricky Villanueva, is a 63 y.o. male, DOB - 07-Feb-1951, IHK:[id number]  Admit date - 04/21/2013   Admitting Physician Robbie Lis, MD  Outpatient Primary MD for the patient is [name], MD  LOS - 3   Chief Complaint  Patient presents with  . Hemoptysis        Assessment & Plan   Respiratory failure with hypoxia  -Multifactorial causes: pneumonia, questionable postobstructive pneumonia, PE -CXR: progressive RLL airspace disease and soft tissue fullness in right hilar region -CT Chest: non-occlusive PE in LUL and RLL                       -continue vancomycin and cefepime, duoneb treatments, supplemental oxygen to maintain saturations above 92% -Blood cultures negative to date, strep pneumonia and influenza negative   Questionable lung mass, right lung base  -CT Chest: Lytic focus in right lung base -Neurology consulted and planning bronchoscopy with biopsy for 04/26/2013 -Patient has extensive smoking history  HCAP (healthcare-associated pneumonia)  -Treatment and plan as stated above  Pulmonary embolism  -Currently on heparin drip -Pending lower ext duplex -If positive, IVC filter to be placed on 04/25/2013 (per pulmonology notes) -No oral anticoagulation at this time, per pulmonololgy  Hemoptysis  -likely due to cough and straining related to cough  -continue to monitor for other episodes of hemoptysis   GERD (gastroesophageal reflux disease)  -Continue protonix and Pepcid (home medications)  Hypertension -Continue lisinopril   Code Status: Full  Family Communication: None at bedside.  Patient does not want his wife to know about his blood clot or lung mass.  Disposition Plan: Admitted  Time Spent in minutes   35 minutes  Procedures None  Consults  Pulmonology  DVT Prophylaxis  Heparin   Lab Results  Component Value Date   PLT 284  04/24/2013    Medications  Scheduled Meds: . ceFEPime (MAXIPIME) IV  2 g Intravenous Q12H  . cholecalciferol  2,000 Units Oral Daily  . famotidine  20 mg Oral BID  . ipratropium-albuterol  3 mL Nebulization Q6H  . lisinopril  10 mg Oral Daily  . pantoprazole  40 mg Oral Daily  . vancomycin  750 mg Intravenous Q12H   Continuous Infusions: . sodium chloride 1,000 mL (04/24/13 7564)  . heparin 1,500 Units/hr (04/23/13 2137)   PRN Meds:.acetaminophen, acetaminophen, guaiFENesin-codeine, HYDROcodone-acetaminophen, ondansetron (ZOFRAN) IV, ondansetron  Antibiotics    Anti-infectives   Start     Dose/Rate Route Frequency Ordered Stop   04/22/13 1800  ceFEPIme (MAXIPIME) 2 g in dextrose 5 % 50 mL IVPB     2 g 100 mL/hr over 30 Minutes Intravenous Every 12 hours 04/22/13 1516     04/21/13 2200  aztreonam (AZACTAM) 2 g in dextrose 5 % 50 mL IVPB  Status:  Discontinued     2 g 100 mL/hr over 30 Minutes Intravenous 3 times per day 04/21/13 1453 04/21/13 1516   04/21/13 1600  ceFEPIme (MAXIPIME) 2 g in dextrose 5 %  50 mL IVPB  Status:  Discontinued     2 g 100 mL/hr over 30 Minutes Intravenous Every 24 hours 04/21/13 1516 04/22/13 1516   04/21/13 1500  vancomycin (VANCOCIN) IVPB 750 mg/150 ml premix     750 mg 150 mL/hr over 60 Minutes Intravenous Every 12 hours 04/21/13 1425     04/21/13 1430  aztreonam (AZACTAM) 2 g in dextrose 5 % 50 mL IVPB  Status:  Discontinued     2 g 100 mL/hr over 30 Minutes Intravenous  Once 04/21/13 1412 04/21/13 1615        Subjective:   Ricky Villanueva seen and examined today.  Patient still complains of shortness of breath and cough, however states he is not having chest pain.  He gets SOB when walking to the bathroom.  He does not want his wife to know about the blood clot yet or anything else at this time.    Objective:   Filed Vitals:   04/23/13 1425 04/23/13 1732 04/23/13 2038 04/24/13 0449  BP: 160/80 143/83 147/81 128/72  Pulse: 84 101 103 86    Temp: 97.6 F (36.4 C) 98 F (36.7 C) 100.8 F (38.2 C) 98 F (36.7 C)  TempSrc: Oral Oral Oral Oral  Resp: 18 19 20 16   Height:      Weight:      SpO2: 96% 92% 92% 90%    Wt Readings from Last 3 Encounters:  04/22/13 62.7 kg (138 lb 3.7 oz)  04/18/13 59.693 kg (131 lb 9.6 oz)  04/14/13 59.784 kg (131 lb 12.8 oz)     Intake/Output Summary (Last 24 hours) at 04/24/13 7494 Last data filed at 04/24/13 0700  Gross per 24 hour  Intake   3290 ml  Output    625 ml  Net   2665 ml    Exam  General: Well developed, well nourished, NAD, appears stated age  HEENT: NCAT, mucous membranes moist.  Neck: Supple, no JVD, no masses  Cardiovascular: S1 S2 auscultated, no rubs, murmurs or gallops. Regular rate and rhythm.  Respiratory: Coarse breath sounds, scattered crackles a few rhonchi  Abdomen: Soft, nontender, nondistended, + bowel sounds  Extremities: warm dry without cyanosis clubbing or edema  Neuro: AAOx3, No focal deficits   Skin: Without rashes exudates or nodules  Psych: Normal affect and demeanor with intact judgement and insight  Data Review   Micro Results Recent Results (from the past 240 hour(s))  CULTURE, BLOOD (ROUTINE X 2)     Status: None   Collection Time    04/21/13  2:40 PM      Result Value Ref Range Status   Specimen Description BLOOD ARM LEFT   Final   Special Requests BOTTLES DRAWN AEROBIC AND ANAEROBIC 5CC   Final   Culture  Setup Time     Final   Value: 04/21/2013 20:43     Performed at [facility]   Culture     Final   Value:        BLOOD CULTURE RECEIVED NO GROWTH TO DATE CULTURE WILL BE HELD FOR 5 DAYS BEFORE ISSUING A FINAL NEGATIVE REPORT     Performed at [facility]   Report Status PENDING   Incomplete  CULTURE, BLOOD (ROUTINE X 2)     Status: None   Collection Time    04/21/13  2:50 PM      Result Value Ref Range Status   Specimen Description BLOOD HAND LEFT   Final  Special Requests BOTTLES DRAWN AEROBIC  AND ANAEROBIC 5CC   Final   Culture  Setup Time     Final   Value: 04/21/2013 20:42     Performed at [facility]   Culture     Final   Value:        BLOOD CULTURE RECEIVED NO GROWTH TO DATE CULTURE WILL BE HELD FOR 5 DAYS BEFORE ISSUING A FINAL NEGATIVE REPORT     Performed at [facility]   Report Status PENDING   Incomplete    Radiology Reports Dg Chest 2 View  04/21/2013   CLINICAL DATA:  Cough and hemoptysis  EXAM: CHEST  2 VIEW  COMPARISON:  DG CHEST 2V dated 04/14/2013  FINDINGS: There has been an interval increase in the density of the confluent infiltrate in the right lower lobe consistent with progressive pneumonia. The right middle and upper lobes appear clear. The left lung appears clear. No significant pleural effusion is demonstrated. The cardiac silhouette is normal in size. The pulmonary vascularity is not engorged. The trachea is midline. There is soft tissue fullness in the right hilar region which may reflect lymphadenopathy. This is not new.  IMPRESSION: Progressive increased density in the right lower lobe is present and is consistent with worsening atelectasis or pneumonia. Soft tissue fullness in the hilar region is suspicious for lymphadenopathy or mass. Chest CT scanning is recommended.   Electronically Signed   By: David  Martinique   On: 04/21/2013 13:48   Dg Chest 2 View  04/18/2013   CLINICAL DATA:  Cough  EXAM: CHEST  2 VIEW  COMPARISON:  02/05/2004  FINDINGS: There is dense consolidation in the right lower lobe consistent with pneumonia. Small area of mild consolidation is noted at the base of the right middle lobe.  Lungs are hyperexpanded but otherwise clear. No pleural effusion or pneumothorax.  Cardiac silhouette is normal in size. There is fullness of the right hilum suggesting reactive adenopathy.  IMPRESSION: 1. Right lower lobe pneumonia with a small focus of right middle lobe infiltrate. Fullness of the right hilum suggests reactive right hilar  adenopathy.   Electronically Signed   By: Lajean Manes M.D.   On: 04/18/2013 08:13   Ct Angio Chest W/cm &/or Wo Cm  04/21/2013   CLINICAL DATA:  Hemoptysis, shortness of breath, right chest pain.  EXAM: CT ANGIOGRAPHY CHEST WITH CONTRAST  TECHNIQUE: Multidetector CT imaging of the chest was performed using the standard protocol during bolus administration of intravenous contrast. Multiplanar CT image reconstructions and MIPs were obtained to evaluate the vascular anatomy.  CONTRAST:  155mL OMNIPAQUE IOHEXOL 350 MG/ML SOLN  COMPARISON:  DG CHEST 2 VIEW dated 04/21/2013; DG CHEST 2V dated 04/14/2013  FINDINGS: Main pulmonary artery is not enlarged. Nonocclusive segmental to subsegmental left upper lobe pulmonary embolus, bulky right hilar lymphadenopathy encases and narrows the right lower lobe pulmonary arteries, right middle lobe pulmonary vein. Nonocclusive pulmonary embolism right lower lobe segmental branches. Hilar lymphadenopathy encases the right middle lobe and lower lobe bronchi, incompletely and occludes the right lower lobe subsegmental bronchi. Patchy consolidation throughout the right lung, with interstitial prominence. More focal consolidation within the medial segment of the right lower lobe measuring 1.5 x 3.3 cm.  Mildly cystic 10 mm right peritracheal lymph nodes, a pretracheal 25 mm subcarinal 28 x 53 mm nodal conglomeration bulky right hilar lymphadenopathy. Severe centrilobular emphysema, with apical bullous changes. No pleural effusions. No left pulmonary nodule or mass.  The heart size  is not enlarged, apparent right heart strain. Pericardium is nonsuspicious. Mild Coronary artery calcifications. Thoracic aorta is normal in course and caliber thickening vessels. Included view of the abdomen is nonsuspicious appearing bilateral acromioclavicular osteoarthrosis. .  Review of the MIP images confirms the above findings.  IMPRESSION: Nonocclusive left upper lobe, right lower lobe pulmonary  emboli. Right heart strain.  Bulky mediastinal and right hilar lymphadenopathy, encasing the of pulmonary arteries, pulmonary veins and bronchi, completely effacing right lower lobe segmental bronchi. Extensive consolidation in the right lung suggests postobstructive pneumonia, with interstitial prominence which may reflect superimposed lymphangitic spread of infection or neoplasm. Constellation of findings are highly concerning for neoplastic process, with consolidated focus in the right lung base, unclear whether this reflects lung primary. Additional consideration is lymphoma. Findings less likely reflect autoimmune disorders such as sarcoid.  Critical Value/emergent results were called by telephone at the time of interpretation on 04/21/2013 at 4:00 PM to [facility] PA, who verbally acknowledged these results.   Electronically Signed   By: Elon Alas   On: 04/21/2013 16:04    CBC  Recent Labs Lab 04/21/13 1223 04/21/13 1820 04/22/13 0406 04/23/13 0446 04/24/13 0629  WBC 13.2* 12.3* 15.5* 13.7* 10.4  HGB 13.4 12.2* 11.4* 11.3* 11.0*  HCT 38.6* 35.3* 33.4* 32.8* 32.8*  PLT 274 265 251 277 284  MCV 92.1 93.9 93.0 94.0 93.2  MCH 32.0 32.4 31.8 32.4 31.3  MCHC 34.7 34.6 34.1 34.5 33.5  RDW 11.6 11.7 11.8 12.2 12.1  LYMPHSABS 1.1 0.3*  --   --   --   MONOABS 1.6* 0.2  --   --   --   EOSABS 0.0 0.0  --   --   --   BASOSABS 0.0 0.0  --   --   --     Chemistries   Recent Labs Lab 04/21/13 1223 04/21/13 1820 04/22/13 0406  NA 136* 135* 137  K 4.5 4.2 3.9  CL 97 97 102  CO2 25 20 22   GLUCOSE 112* 210* 216*  BUN 16 15 17   CREATININE 1.07 1.04 0.96  CALCIUM 9.2 8.5 8.7  MG  --  1.8  --   AST 18 17 14   ALT 14 13 10   ALKPHOS 87 78 70  BILITOT 0.8 0.4 0.3   ------------------------------------------------------------------------------------------------------------------ estimated creatinine clearance is 71.7 ml/min (by C-G formula based on Cr of  0.96). ------------------------------------------------------------------------------------------------------------------ No results found for this basename: HGBA1C,  in the last 72 hours ------------------------------------------------------------------------------------------------------------------ No results found for this basename: CHOL, HDL, LDLCALC, TRIG, CHOLHDL, LDLDIRECT,  in the last 72 hours ------------------------------------------------------------------------------------------------------------------  Recent Labs  04/21/13 1820  TSH 0.590   ------------------------------------------------------------------------------------------------------------------ No results found for this basename: VITAMINB12, FOLATE, FERRITIN, TIBC, IRON, RETICCTPCT,  in the last 72 hours  Coagulation profile  Recent Labs Lab 04/21/13 1820  INR 1.17    No results found for this basename: DDIMER,  in the last 72 hours  Cardiac Enzymes  Recent Labs Lab 04/21/13 1224  TROPONINI <0.30   ------------------------------------------------------------------------------------------------------------------ No components found with this basename: POCBNP,     Neithan Day D.O. on 04/24/2013 at 8:21 AM  Between 7am to 7pm - Pager - [phone]  After 7pm go to [email or web address] - password [email or web address]  And look for the night coverage person covering for me after hours  [facility] Office  [phone]

## 2013-04-24 NOTE — Progress Notes (Signed)
ANTICOAGULATION CONSULT NOTE - Follow Up Consult  Pharmacy Consult for Heparin Indication: bilateral PE  Allergies  Allergen Reactions  . Augmentin [Amoxicillin-Pot Clavulanate] Itching and Rash    Patient Measurements: Height: 5\' 7"  (170.2 cm) Weight: 138 lb 3.7 oz (62.7 kg) IBW/kg (Calculated) : 66.1 Heparin Dosing Weight: 62kg  Vital Signs: Temp: 98 F (36.7 C) (03/01 0449) Temp src: Oral (03/01 0449) BP: 128/72 mmHg (03/01 0449) Pulse Rate: 86 (03/01 0449)  Labs:  Recent Labs  04/21/13 1223 04/21/13 1224 04/21/13 1820  04/22/13 0406  04/23/13 0446 04/23/13 1326 04/23/13 2009 04/24/13 0629  HGB 13.4  --  12.2*  --  11.4*  --  11.3*  --   --  11.0*  HCT 38.6*  --  35.3*  --  33.4*  --  32.8*  --   --  32.8*  PLT 274  --  265  --  251  --  277  --   --  284  APTT  --   --  75*  --   --   --   --   --   --   --   LABPROT  --   --  14.7  --   --   --   --   --   --   --   INR  --   --  1.17  --   --   --   --   --   --   --   HEPARINUNFRC  --   --   --   < > 0.27*  < > 0.13* 0.38 0.34 0.33  CREATININE 1.07  --  1.04  --  0.96  --   --   --   --   --   TROPONINI  --  <0.30  --   --   --   --   --   --   --   --   < > = values in this interval not displayed.  Estimated Creatinine Clearance: 71.7 ml/min (by C-G formula based on Cr of 0.96).   Medications:  Heparin @ 1400 units/hr  Assessment: 62yom continues on heparin for bilateral PE (per CT on 2/26). Heparin level is at goal but again on the low end. Will increase rate slightly again for today. LE dopplers pending to rule out DVT.  H/h are low but stable with a normal plt.  Goal of Therapy:  Heparin level 0.3-0.7 units/ml Monitor platelets by anticoagulation protocol: Yes   Plan:  1) Increase heparin to 1600 units/hr 2) Follow up heparin level, CBC in AM   Jeronimo Norma, PharmD Clinical Pharmacist Pager: [phone]  Jeronimo Norma 04/24/2013,8:19 AM

## 2013-04-24 NOTE — Progress Notes (Signed)
ANTICOAGULATION and ANTIBIOTIC CONSULT NOTE - Follow Up Consult  Pharmacy Consult for Heparin, Vancomycin, and Cefepime Indication: pulmonary embolus; HCAP  Allergies  Allergen Reactions  . Augmentin [Amoxicillin-Pot Clavulanate] Itching and Rash    Patient Measurements: Height: 5\' 7"  (170.2 cm) Weight: 138 lb 3.7 oz (62.7 kg) IBW/kg (Calculated) : 66.1 Heparin Dosing Weight: 58 kg  Vital Signs: Temp: 97.7 F (36.5 C) (03/01 0900) Temp src: Oral (03/01 0900) BP: 132/76 mmHg (03/01 0900) Pulse Rate: 85 (03/01 0900)  Labs:  Recent Labs  04/21/13 1820  04/22/13 0406  04/23/13 0446 04/23/13 1326 04/23/13 2009 04/24/13 0629  HGB 12.2*  --  11.4*  --  11.3*  --   --  11.0*  HCT 35.3*  --  33.4*  --  32.8*  --   --  32.8*  PLT 265  --  251  --  277  --   --  284  APTT 75*  --   --   --   --   --   --   --   LABPROT 14.7  --   --   --   --   --   --   --   INR 1.17  --   --   --   --   --   --   --   HEPARINUNFRC  --   < > 0.27*  < > 0.13* 0.38 0.34 0.33  CREATININE 1.04  --  0.96  --   --   --   --   --   < > = values in this interval not displayed.  Estimated Creatinine Clearance: 71.7 ml/min (by C-G formula based on Cr of 0.96).   Medications:  Scheduled:  . ceFEPime (MAXIPIME) IV  2 g Intravenous Q12H  . cholecalciferol  2,000 Units Oral Daily  . famotidine  20 mg Oral BID  . ipratropium-albuterol  3 mL Nebulization Q6H  . lisinopril  10 mg Oral Daily  . pantoprazole  40 mg Oral Daily  . vancomycin  750 mg Intravenous Q12H   Infusions:  . sodium chloride 1,000 mL (04/24/13 1537)  . heparin 1,600 Units/hr (04/24/13 1035)    Assessment: 63 yo M who presented to ED 2/26 with progressively worsening SOB and cough with hemoptysis. CXR consistent with atelectasis or pna; possible lymphadenopathy or mass. CT was ordered to eval mass when pt was found to have PE.  Continues on Vancomycin and Cefepime for presumed HCAP.  Renal function with improvement, CrCl ~ 70  ml/min.  Pt is afebrile but WBC is now wnl.  VT after 5th dose is 10.6.  Will increase to Vanc and recheck at steady state.  Urine Legionella neg Urine Strep PNA neg Flu neg 2/26 blood x 2 ngtd   Goal of Therapy:  Heparin level 0.3-0.7 units/ml Monitor platelets by anticoagulation protocol: Yes  Vancomycin trough 15-20 mcg/ml   Plan:  Increase Vancomycin 1000 mg IV q12h, and recheck VT at steady state Continue Cefepime 2gm IV q12h  Jeronimo Norma, PharmD Clinical Pharmacist Pager: [phone]   04/24/2013 3:38 PM

## 2013-04-24 NOTE — Progress Notes (Signed)
*  Preliminary Results* Bilateral lower extremity venous duplex completed. Bilateral lower extremities are negative for deep vein thrombosis. There is no evidence of Baker's cyst bilaterally.  04/24/2013  Maudry Mayhew, RVT, RDCS, RDMS

## 2013-04-25 DIAGNOSIS — J439 Emphysema, unspecified: Secondary | ICD-10-CM

## 2013-04-25 LAB — HEPARIN LEVEL (UNFRACTIONATED): Heparin Unfractionated: 0.68 IU/mL (ref 0.30–0.70)

## 2013-04-25 LAB — CBC
HEMATOCRIT: 31.7 % — AB (ref 39.0–52.0)
HEMOGLOBIN: 10.8 g/dL — AB (ref 13.0–17.0)
MCH: 31.7 pg (ref 26.0–34.0)
MCHC: 34.1 g/dL (ref 30.0–36.0)
MCV: 93 fL (ref 78.0–100.0)
Platelets: 260 10*3/uL (ref 150–400)
RBC: 3.41 MIL/uL — ABNORMAL LOW (ref 4.22–5.81)
RDW: 12.1 % (ref 11.5–15.5)
WBC: 9 10*3/uL (ref 4.0–10.5)

## 2013-04-25 MED ORDER — IPRATROPIUM-ALBUTEROL 0.5-2.5 (3) MG/3ML IN SOLN
3.0000 mL | Freq: Two times a day (BID) | RESPIRATORY_TRACT | Status: DC
  Administered 2013-04-26 – 2013-04-29 (×7): 3 mL via RESPIRATORY_TRACT
  Filled 2013-04-25 (×7): qty 3

## 2013-04-25 MED ORDER — HEPARIN BOLUS VIA INFUSION
2000.0000 [IU] | Freq: Once | INTRAVENOUS | Status: AC
  Administered 2013-04-25: 2000 [IU] via INTRAVENOUS
  Filled 2013-04-25: qty 2000

## 2013-04-25 NOTE — Progress Notes (Signed)
ANTICOAGULATION CONSULT NOTE - Follow Up Consult  Pharmacy Consult for Heparin Indication: pulmonary embolus (bilateral)  Allergies  Allergen Reactions  . Augmentin [Amoxicillin-Pot Clavulanate] Itching and Rash    Patient Measurements: Height: 5\' 7"  (170.2 cm) Weight: 131 lb 13.4 oz (59.8 kg) IBW/kg (Calculated) : 66.1 Heparin Dosing Weight: 60 kg  Vital Signs: Temp: 97.7 F (36.5 C) (03/02 1213) Temp src: Oral (03/02 1213) BP: 133/82 mmHg (03/02 1213) Pulse Rate: 92 (03/02 1213)  Labs:  Recent Labs  04/23/13 0446  04/24/13 0629 04/25/13 0405 04/25/13 1201  HGB 11.3*  --  11.0* 10.8*  --   HCT 32.8*  --  32.8* 31.7*  --   PLT 277  --  284 260  --   HEPARINUNFRC 0.13*  < > 0.33 <0.10* 0.68  < > = values in this interval not displayed.  Estimated Creatinine Clearance: 68.3 ml/min (by C-G formula based on Cr of 0.96).   Medications:  Scheduled:  . ceFEPime (MAXIPIME) IV  2 g Intravenous Q12H  . cholecalciferol  2,000 Units Oral Daily  . famotidine  20 mg Oral BID  . ipratropium-albuterol  3 mL Nebulization Q6H  . lisinopril  10 mg Oral Daily  . pantoprazole  40 mg Oral Daily  . vancomycin  1,000 mg Intravenous Q12H   Infusions:  . sodium chloride 1,000 mL (04/25/13 1009)  . heparin 1,800 Units/hr (04/25/13 0537)    Assessment: 63 yo M continues on heparin for bilateral pulmonary emboli.  Heparin level is at upper end of range after bolus and rate increase this morning.  Noted plans for bronchoscopy and possible biopsy tomorrow at 1100.  Heparin to be off starting at 0500 tomorrow.  Goal of Therapy:  Heparin level 0.3-0.7 units/ml Monitor platelets by anticoagulation protocol: Yes   Plan:  Continue heparin at 1800 units/hr. Turn heparin off at 0500 3/3 per pulm recs prior to bronch. Follow-up plans to resume anticoagulation post-procedure. Will discontinue AM heparin level as it is likely that therapy will be off at this time. Continue daily  CBC.  [employer], Pharm.D., BCPS Clinical Pharmacist Pager [phone] 04/25/2013 1:42 PM

## 2013-04-25 NOTE — Progress Notes (Signed)
[facility]                                                                              Patient Demographics  Ricky Villanueva, is a 63 y.o. male, DOB - 1950/05/12, MCN:[id number]  Admit date - 04/21/2013   Admitting Physician Ricky Lis, MD  Outpatient Primary MD for the patient is Ricky Gainer, MD  LOS - 4   Chief Complaint  Patient presents with  . Hemoptysis        Assessment & Plan   Respiratory failure with hypoxia  -Multifactorial causes: pneumonia, questionable postobstructive pneumonia, PE -CXR: progressive RLL airspace disease and soft tissue fullness in right hilar region -CT Chest: non-occlusive PE in LUL and RLL                       -continue vancomycin and cefepime, duoneb treatments, supplemental oxygen to maintain saturations above 92% -Blood cultures negative to date, strep pneumonia and influenza negative   Questionable lung mass, right lung base  -CT Chest: Lytic focus in right lung base -Neurology consulted and planning bronchoscopy with biopsy for 04/26/2013 -Patient has extensive smoking history  HCAP (healthcare-associated pneumonia)  -Treatment and plan as stated above  Pulmonary embolism  -Currently on heparin drip -Lower Ext doppler negative for DVT -If positive, IVC filter to be placed on 04/25/2013 (per pulmonology notes) -No oral anticoagulation at this time, per pulmonololgy  Hemoptysis  -likely due to cough and straining related to cough  -continue to monitor for other episodes of hemoptysis   GERD (gastroesophageal reflux disease)  -Continue protonix and Pepcid (home medications)  Hypertension -Continue lisinopril   Code Status: Full  Family Communication: None at bedside.    Disposition Plan: Admitted  Time Spent in minutes   30 minutes  Procedures None  Consults  Pulmonology  DVT Prophylaxis  Heparin   Lab Results  Component Value Date   PLT 260 04/25/2013    Medications  Scheduled Meds: . ceFEPime  (MAXIPIME) IV  2 g Intravenous Q12H  . cholecalciferol  2,000 Units Oral Daily  . famotidine  20 mg Oral BID  . ipratropium-albuterol  3 mL Nebulization Q6H  . lisinopril  10 mg Oral Daily  . pantoprazole  40 mg Oral Daily  . vancomycin  1,000 mg Intravenous Q12H   Continuous Infusions: . sodium chloride 1,000 mL (04/24/13 1537)  . heparin 1,800 Units/hr (04/25/13 0537)   PRN Meds:.acetaminophen, acetaminophen, guaiFENesin-codeine, HYDROcodone-acetaminophen, ondansetron (ZOFRAN) IV, ondansetron  Antibiotics    Anti-infectives   Start     Dose/Rate Route Frequency Ordered Stop   04/25/13 0300  vancomycin (VANCOCIN) IVPB 1000 mg/200 mL premix     1,000 mg 200 mL/hr over 60 Minutes Intravenous Every 12 hours 04/24/13 1543     04/22/13 1800  ceFEPIme (MAXIPIME) 2 g in dextrose 5 % 50 mL IVPB     2 g 100 mL/hr over 30 Minutes Intravenous Every 12 hours 04/22/13 1516     04/21/13 2200  aztreonam (AZACTAM) 2 g in dextrose 5 % 50 mL IVPB  Status:  Discontinued     2 g 100 mL/hr over 30 Minutes Intravenous 3  times per day 04/21/13 1453 04/21/13 1516   04/21/13 1600  ceFEPIme (MAXIPIME) 2 g in dextrose 5 % 50 mL IVPB  Status:  Discontinued     2 g 100 mL/hr over 30 Minutes Intravenous Every 24 hours 04/21/13 1516 04/22/13 1516   04/21/13 1500  vancomycin (VANCOCIN) IVPB 750 mg/150 ml premix  Status:  Discontinued     750 mg 150 mL/hr over 60 Minutes Intravenous Every 12 hours 04/21/13 1425 04/24/13 1543   04/21/13 1430  aztreonam (AZACTAM) 2 g in dextrose 5 % 50 mL IVPB  Status:  Discontinued     2 g 100 mL/hr over 30 Minutes Intravenous  Once 04/21/13 1412 04/21/13 1615        Subjective:   Ricky Villanueva seen and examined today.  Patient still complains of shortness of breath and cough, however states he is not having chest pain.  He gets SOB when walking to the bathroom.  He wants his wife to know very limited information regarding his current state of health.   Objective:    Filed Vitals:   04/24/13 2004 04/24/13 2021 04/25/13 0155 04/25/13 0523  BP:  141/78  128/83  Pulse:  94  87  Temp:  98.1 F (36.7 C)  98.1 F (36.7 C)  TempSrc:  Oral  Oral  Resp:  18  18  Height:      Weight:  59.8 kg (131 lb 13.4 oz)    SpO2: 92% 92% 92% 92%    Wt Readings from Last 3 Encounters:  04/24/13 59.8 kg (131 lb 13.4 oz)  04/18/13 59.693 kg (131 lb 9.6 oz)  04/14/13 59.784 kg (131 lb 12.8 oz)     Intake/Output Summary (Last 24 hours) at 04/25/13 2956 Last data filed at 04/24/13 1831  Gross per 24 hour  Intake    642 ml  Output   2000 ml  Net  -1358 ml    Exam  General: Well developed, well nourished, NAD, appears stated age  HEENT: NCAT, mucous membranes moist.  Neck: Supple, no JVD, no masses  Cardiovascular: S1 S2 auscultated, no rubs, murmurs or gallops. Regular rate and rhythm.  Respiratory: Coarse breath sounds, scattered crackles a few rhonchi  Abdomen: Soft, nontender, nondistended, + bowel sounds  Extremities: warm dry without cyanosis clubbing or edema  Neuro: AAOx3, No focal deficits   Skin: Without rashes exudates or nodules  Psych: Normal affect and demeanor with intact judgement and insight  Data Review   Micro Results Recent Results (from the past 240 hour(s))  CULTURE, BLOOD (ROUTINE X 2)     Status: None   Collection Time    04/21/13  2:40 PM      Result Value Ref Range Status   Specimen Description BLOOD ARM LEFT   Final   Special Requests BOTTLES DRAWN AEROBIC AND ANAEROBIC 5CC   Final   Culture  Setup Time     Final   Value: 04/21/2013 20:43     Performed at [facility]   Culture     Final   Value:        BLOOD CULTURE RECEIVED NO GROWTH TO DATE CULTURE WILL BE HELD FOR 5 DAYS BEFORE ISSUING A FINAL NEGATIVE REPORT     Performed at [facility]   Report Status PENDING   Incomplete  CULTURE, BLOOD (ROUTINE X 2)     Status: None   Collection Time    04/21/13  2:50 PM  Result Value Ref Range  Status   Specimen Description BLOOD HAND LEFT   Final   Special Requests BOTTLES DRAWN AEROBIC AND ANAEROBIC 5CC   Final   Culture  Setup Time     Final   Value: 04/21/2013 20:42     Performed at [facility]   Culture     Final   Value:        BLOOD CULTURE RECEIVED NO GROWTH TO DATE CULTURE WILL BE HELD FOR 5 DAYS BEFORE ISSUING A FINAL NEGATIVE REPORT     Performed at [facility]   Report Status PENDING   Incomplete    Radiology Reports Dg Chest 2 View  04/21/2013   CLINICAL DATA:  Cough and hemoptysis  EXAM: CHEST  2 VIEW  COMPARISON:  DG CHEST 2V dated 04/14/2013  FINDINGS: There has been an interval increase in the density of the confluent infiltrate in the right lower lobe consistent with progressive pneumonia. The right middle and upper lobes appear clear. The left lung appears clear. No significant pleural effusion is demonstrated. The cardiac silhouette is normal in size. The pulmonary vascularity is not engorged. The trachea is midline. There is soft tissue fullness in the right hilar region which may reflect lymphadenopathy. This is not new.  IMPRESSION: Progressive increased density in the right lower lobe is present and is consistent with worsening atelectasis or pneumonia. Soft tissue fullness in the hilar region is suspicious for lymphadenopathy or mass. Chest CT scanning is recommended.   Electronically Signed   By: David  Martinique   On: 04/21/2013 13:48   Dg Chest 2 View  04/18/2013   CLINICAL DATA:  Cough  EXAM: CHEST  2 VIEW  COMPARISON:  02/05/2004  FINDINGS: There is dense consolidation in the right lower lobe consistent with pneumonia. Small area of mild consolidation is noted at the base of the right middle lobe.  Lungs are hyperexpanded but otherwise clear. No pleural effusion or pneumothorax.  Cardiac silhouette is normal in size. There is fullness of the right hilum suggesting reactive adenopathy.  IMPRESSION: 1. Right lower lobe pneumonia with a small focus  of right middle lobe infiltrate. Fullness of the right hilum suggests reactive right hilar adenopathy.   Electronically Signed   By: Lajean Manes M.D.   On: 04/18/2013 08:13   Ct Angio Chest W/cm &/or Wo Cm  04/21/2013   CLINICAL DATA:  Hemoptysis, shortness of breath, right chest pain.  EXAM: CT ANGIOGRAPHY CHEST WITH CONTRAST  TECHNIQUE: Multidetector CT imaging of the chest was performed using the standard protocol during bolus administration of intravenous contrast. Multiplanar CT image reconstructions and MIPs were obtained to evaluate the vascular anatomy.  CONTRAST:  170mL OMNIPAQUE IOHEXOL 350 MG/ML SOLN  COMPARISON:  DG CHEST 2 VIEW dated 04/21/2013; DG CHEST 2V dated 04/14/2013  FINDINGS: Main pulmonary artery is not enlarged. Nonocclusive segmental to subsegmental left upper lobe pulmonary embolus, bulky right hilar lymphadenopathy encases and narrows the right lower lobe pulmonary arteries, right middle lobe pulmonary vein. Nonocclusive pulmonary embolism right lower lobe segmental branches. Hilar lymphadenopathy encases the right middle lobe and lower lobe bronchi, incompletely and occludes the right lower lobe subsegmental bronchi. Patchy consolidation throughout the right lung, with interstitial prominence. More focal consolidation within the medial segment of the right lower lobe measuring 1.5 x 3.3 cm.  Mildly cystic 10 mm right peritracheal lymph nodes, a pretracheal 25 mm subcarinal 28 x 53 mm nodal conglomeration bulky right hilar lymphadenopathy. Severe centrilobular emphysema,  with apical bullous changes. No pleural effusions. No left pulmonary nodule or mass.  The heart size is not enlarged, apparent right heart strain. Pericardium is nonsuspicious. Mild Coronary artery calcifications. Thoracic aorta is normal in course and caliber thickening vessels. Included view of the abdomen is nonsuspicious appearing bilateral acromioclavicular osteoarthrosis. .  Review of the MIP images confirms the  above findings.  IMPRESSION: Nonocclusive left upper lobe, right lower lobe pulmonary emboli. Right heart strain.  Bulky mediastinal and right hilar lymphadenopathy, encasing the of pulmonary arteries, pulmonary veins and bronchi, completely effacing right lower lobe segmental bronchi. Extensive consolidation in the right lung suggests postobstructive pneumonia, with interstitial prominence which may reflect superimposed lymphangitic spread of infection or neoplasm. Constellation of findings are highly concerning for neoplastic process, with consolidated focus in the right lung base, unclear whether this reflects lung primary. Additional consideration is lymphoma. Findings less likely reflect autoimmune disorders such as sarcoid.  Critical Value/emergent results were called by telephone at the time of interpretation on 04/21/2013 at 4:00 PM to [facility] PA, who verbally acknowledged these results.   Electronically Signed   By: Elon Alas   On: 04/21/2013 16:04    CBC  Recent Labs Lab 04/21/13 1223 04/21/13 1820 04/22/13 0406 04/23/13 0446 04/24/13 0629 04/25/13 0405  WBC 13.2* 12.3* 15.5* 13.7* 10.4 9.0  HGB 13.4 12.2* 11.4* 11.3* 11.0* 10.8*  HCT 38.6* 35.3* 33.4* 32.8* 32.8* 31.7*  PLT 274 265 251 277 284 260  MCV 92.1 93.9 93.0 94.0 93.2 93.0  MCH 32.0 32.4 31.8 32.4 31.3 31.7  MCHC 34.7 34.6 34.1 34.5 33.5 34.1  RDW 11.6 11.7 11.8 12.2 12.1 12.1  LYMPHSABS 1.1 0.3*  --   --   --   --   MONOABS 1.6* 0.2  --   --   --   --   EOSABS 0.0 0.0  --   --   --   --   BASOSABS 0.0 0.0  --   --   --   --     Chemistries   Recent Labs Lab 04/21/13 1223 04/21/13 1820 04/22/13 0406  NA 136* 135* 137  K 4.5 4.2 3.9  CL 97 97 102  CO2 25 20 22   GLUCOSE 112* 210* 216*  BUN 16 15 17   CREATININE 1.07 1.04 0.96  CALCIUM 9.2 8.5 8.7  MG  --  1.8  --   AST 18 17 14   ALT 14 13 10   ALKPHOS 87 78 70  BILITOT 0.8 0.4 0.3    ------------------------------------------------------------------------------------------------------------------ estimated creatinine clearance is 68.3 ml/min (by C-G formula based on Cr of 0.96). ------------------------------------------------------------------------------------------------------------------ No results found for this basename: HGBA1C,  in the last 72 hours ------------------------------------------------------------------------------------------------------------------ No results found for this basename: CHOL, HDL, LDLCALC, TRIG, CHOLHDL, LDLDIRECT,  in the last 72 hours ------------------------------------------------------------------------------------------------------------------ No results found for this basename: TSH, T4TOTAL, FREET3, T3FREE, THYROIDAB,  in the last 72 hours ------------------------------------------------------------------------------------------------------------------ No results found for this basename: VITAMINB12, FOLATE, FERRITIN, TIBC, IRON, RETICCTPCT,  in the last 72 hours  Coagulation profile  Recent Labs Lab 04/21/13 1820  INR 1.17    No results found for this basename: DDIMER,  in the last 72 hours  Cardiac Enzymes  Recent Labs Lab 04/21/13 1224  TROPONINI <0.30   ------------------------------------------------------------------------------------------------------------------ No components found with this basename: POCBNP,     Nada Godley D.O. on 04/25/2013 at 8:08 AM  Between 7am to 7pm - Pager - [phone]  After 7pm go to [email or web address] - password  [email or web address]  And look for the night coverage person covering for me after hours  [facility] Office  [phone]

## 2013-04-25 NOTE — Progress Notes (Signed)
Name: Ricky Villanueva  MRN: [id number]  DOB: 12-Feb-1951  ADMISSION DATE: 04/21/2013  CONSULTATION DATE: 04/22/13  REFERRING MD : Dr. Charlies Silvers   CHIEF COMPLAINT: PE, ? Lung Mass   BRIEF PATIENT DESCRIPTION:  63 yo former smoker male admitted with dyspnea and cough with hemoptysis.  Found to have RLL ASD and Rt hilar fullness.  Also found to have LUL and RLL PE.   STUDIES:  2/26 CT chest >> Rt hilar LAN, LUL and RLL PE, patchy ASD on Rt, 5.3 cm subcarinal node, 2.5 cm pretracheal node, severe emphysema with apical bullous changes 3/01 Doppler legs b/l > no DVT  CULTURES:  BCx2 2/26>>>   ANTIBIOTICS:  Cefepime 2/26>>>  Vanco 2/26>>>   SUBJECTIVE:  Still having intermittent nose bleeds.  C/o hoarseness.  Denies chest pain.  Breathing okay.  VITAL SIGNS:  Temp: [97.3 F (36.3 C)-98 F (36.7 C)] 97.4 F (36.3 C) (02/27 1234)  Pulse Rate: [65-97] 65 (02/27 1234)  Resp: [16-20] 16 (02/27 1234)  BP: (92-114)/(54-71) 108/54 mmHg (02/27 1234)  SpO2: [91 %-97 %] 97 % (02/27 1234)  Weight: [128 lb 4.9 oz (58.2 kg)] 128 lb 4.9 oz (58.2 kg) (02/26 2050)    PHYSICAL EXAMINATION:  General: No distress Neuro: normal strength HEENT: no sinus tenderness Cardiovascular: regular Lungs: rales Rt base, no wheeze Abdomen: soft, thin, non tender Musculoskeletal: No edema   ASSESSMENT:   Former smoker with hoarseness/hemoptysis and with Rt hilar mass, RLL ASD, paratracheal/subcarinal adenopathy >> main concern is for malignancy. Probable post-obstructive pneumonia. Emphysema. Non-occlusive PE in LUL and RLL.  PLAN:   -Scheduled for bronchoscopy at 11 am on Tuesday 04/26/13 -NPO after midnight -Hold heparin gtt at 5 am on 04/26/13 in preparation for bronchoscopy -Continue antibiotics per primary team -Continue scheduled nebulizer therapy -Will need further assessment of emphysema with PFT's as outpt  Updated patient's wife at bedside about plan.  Review bronchoscopy procedure.  Explained  risks are bleeding, pneumothorax, infection, and non diagnosis.    Of note is that patient has requested not to inform his family about concern for malignancy until after diagnosis is established.   Chesley Mires, MD [facility] Pulmonary/Critical Care 04/25/2013, 12:12 PM Pager:  [phone] After 3pm call: [phone]

## 2013-04-25 NOTE — Progress Notes (Signed)
ANTICOAGULATION CONSULT NOTE - Follow Up Consult  Pharmacy Consult for heparin Indication: pulmonary embolus  Labs:  Recent Labs  04/23/13 0446  04/23/13 2009 04/24/13 0629 04/25/13 0405  HGB 11.3*  --   --  11.0* 10.8*  HCT 32.8*  --   --  32.8* 31.7*  PLT 277  --   --  284 260  HEPARINUNFRC 0.13*  < > 0.34 0.33 <0.10*  < > = values in this interval not displayed.  Assessment: 62yo male now subtherapeutic on heparin after several levels at low end of goal; no gtt issues or bleeding per RN.  Goal of Therapy:  Heparin level 0.3-0.7 units/ml   Plan:  Will give heparin 2000 units bolus and increase gtt by 3-4 units/kg/hr to 1800 units/hr and check level in 6hr.  Wynona Neat, PharmD, BCPS  04/25/2013,5:17 AM

## 2013-04-26 ENCOUNTER — Encounter (HOSPITAL_COMMUNITY)
Admission: EM | Disposition: A | Discharge status: Home / Self Care | Payer: Self-pay | Source: Home / Self Care | Attending: Internal Medicine

## 2013-04-26 ENCOUNTER — Inpatient Hospital Stay (HOSPITAL_COMMUNITY): Payer: BC Managed Care – PPO

## 2013-04-26 DIAGNOSIS — J438 Other emphysema: Secondary | ICD-10-CM

## 2013-04-26 HISTORY — PX: VIDEO BRONCHOSCOPY: SHX5072

## 2013-04-26 LAB — GLUCOSE, CAPILLARY: Glucose-Capillary: 91 mg/dL (ref 70–99)

## 2013-04-26 LAB — CBC
HEMATOCRIT: 34.1 % — AB (ref 39.0–52.0)
Hemoglobin: 11.7 g/dL — ABNORMAL LOW (ref 13.0–17.0)
MCH: 32.3 pg (ref 26.0–34.0)
MCHC: 34.3 g/dL (ref 30.0–36.0)
MCV: 94.2 fL (ref 78.0–100.0)
PLATELETS: 294 10*3/uL (ref 150–400)
RBC: 3.62 MIL/uL — ABNORMAL LOW (ref 4.22–5.81)
RDW: 12 % (ref 11.5–15.5)
WBC: 10.5 10*3/uL (ref 4.0–10.5)

## 2013-04-26 LAB — SURGICAL PCR SCREEN
MRSA, PCR: NEGATIVE
Staphylococcus aureus: NEGATIVE

## 2013-04-26 LAB — HEPARIN LEVEL (UNFRACTIONATED): HEPARIN UNFRACTIONATED: 0.25 [IU]/mL — AB (ref 0.30–0.70)

## 2013-04-26 SURGERY — BRONCHOSCOPY, WITH FLUOROSCOPY
Anesthesia: Moderate Sedation | Laterality: Bilateral

## 2013-04-26 MED ORDER — LIDOCAINE HCL (PF) 1 % IJ SOLN
INTRAMUSCULAR | Status: DC | PRN
  Administered 2013-04-26: 6 mL

## 2013-04-26 MED ORDER — FENTANYL CITRATE 0.05 MG/ML IJ SOLN
INTRAMUSCULAR | Status: DC | PRN
  Administered 2013-04-26: 25 ug via INTRAVENOUS
  Administered 2013-04-26: 50 ug via INTRAVENOUS

## 2013-04-26 MED ORDER — MIDAZOLAM HCL 5 MG/ML IJ SOLN
INTRAMUSCULAR | Status: AC
  Filled 2013-04-26: qty 2

## 2013-04-26 MED ORDER — LIDOCAINE HCL 2 % EX GEL
CUTANEOUS | Status: DC | PRN
  Administered 2013-04-26: 1

## 2013-04-26 MED ORDER — FENTANYL CITRATE 0.05 MG/ML IJ SOLN
INTRAMUSCULAR | Status: AC
  Filled 2013-04-26: qty 4

## 2013-04-26 MED ORDER — PHENYLEPHRINE HCL 0.25 % NA SOLN
NASAL | Status: DC | PRN
  Administered 2013-04-26: 2 via NASAL

## 2013-04-26 MED ORDER — HEPARIN (PORCINE) IN NACL 100-0.45 UNIT/ML-% IJ SOLN
1900.0000 [IU]/h | INTRAMUSCULAR | Status: DC
  Administered 2013-04-26: 1800 [IU]/h via INTRAVENOUS
  Administered 2013-04-27: 1900 [IU]/h via INTRAVENOUS
  Filled 2013-04-26 (×4): qty 250

## 2013-04-26 MED ORDER — MIDAZOLAM HCL 10 MG/2ML IJ SOLN
INTRAMUSCULAR | Status: DC | PRN
  Administered 2013-04-26: 2 mg via INTRAVENOUS
  Administered 2013-04-26: 4 mg via INTRAVENOUS

## 2013-04-26 NOTE — Progress Notes (Signed)
[facility]                                                                              Patient Demographics  Ricky Villanueva, is a 63 y.o. male, DOB - Aug 12, 1950, IDP:[id number]  Admit date - 04/21/2013   Admitting Physician Robbie Lis, MD  Outpatient Primary MD for the patient is [name], MD  LOS - 5   Chief Complaint  Patient presents with  . Hemoptysis      Interim History: 63 yo with history of smoking admitted with dyspnea, cough, hemoptysis.  Found to have PE in LUL and RLL, also with Right hilar fullness.  Pulmonology consulted and bronchoscopy with biopsy 04/26/13.  Currently on heparin drip.    Assessment & Plan   Respiratory failure with hypoxia  -Multifactorial causes: pneumonia, questionable postobstructive pneumonia, PE -CXR: progressive RLL airspace disease and soft tissue fullness in right hilar region -CT Chest: non-occlusive PE in LUL and RLL                       -continue vancomycin and cefepime, duoneb treatments, supplemental oxygen to maintain saturations above 92% -Blood cultures negative to date, strep pneumonia and influenza negative   Questionable lung mass, right lung base  -CT Chest: Lytic focus in right lung base -Neurology consulted and planning bronchoscopy with biopsy for 04/26/2013 -Patient has extensive smoking history  HCAP (healthcare-associated pneumonia)  -Treatment and plan as stated above  Pulmonary embolism  -Currently on heparin drip -Lower Ext doppler negative for DVT -No oral anticoagulation at this time, per pulmonololgy  Hemoptysis  -likely due to cough and straining related to cough  -continue to monitor for other episodes of hemoptysis   GERD (gastroesophageal reflux disease)  -Continue protonix and Pepcid (home medications)  Hypertension -Continue lisinopril   Code Status: Full  Family Communication: None at bedside.    Disposition Plan: Admitted  Time Spent in minutes   30 minutes  Procedures    Bronchoscopy with biopsy 04/26/13  Consults  Pulmonology  DVT Prophylaxis  Heparin   Lab Results  Component Value Date   PLT 294 04/26/2013    Medications  Scheduled Meds: . ceFEPime (MAXIPIME) IV  2 g Intravenous Q12H  . cholecalciferol  2,000 Units Oral Daily  . famotidine  20 mg Oral BID  . ipratropium-albuterol  3 mL Nebulization BID  . lisinopril  10 mg Oral Daily  . pantoprazole  40 mg Oral Daily  . vancomycin  1,000 mg Intravenous Q12H   Continuous Infusions: . sodium chloride 1,000 mL (04/25/13 2235)   PRN Meds:.acetaminophen, acetaminophen, guaiFENesin-codeine, HYDROcodone-acetaminophen, ondansetron (ZOFRAN) IV, ondansetron  Antibiotics    Anti-infectives   Start     Dose/Rate Route Frequency Ordered Stop   04/25/13 0300  vancomycin (VANCOCIN) IVPB 1000 mg/200 mL premix     1,000 mg 200 mL/hr over 60 Minutes Intravenous Every 12 hours 04/24/13 1543     04/22/13 1800  ceFEPIme (MAXIPIME) 2 g in dextrose 5 % 50 mL IVPB     2 g 100 mL/hr over 30 Minutes Intravenous Every 12 hours 04/22/13 1516     04/21/13 2200  aztreonam (  AZACTAM) 2 g in dextrose 5 % 50 mL IVPB  Status:  Discontinued     2 g 100 mL/hr over 30 Minutes Intravenous 3 times per day 04/21/13 1453 04/21/13 1516   04/21/13 1600  ceFEPIme (MAXIPIME) 2 g in dextrose 5 % 50 mL IVPB  Status:  Discontinued     2 g 100 mL/hr over 30 Minutes Intravenous Every 24 hours 04/21/13 1516 04/22/13 1516   04/21/13 1500  vancomycin (VANCOCIN) IVPB 750 mg/150 ml premix  Status:  Discontinued     750 mg 150 mL/hr over 60 Minutes Intravenous Every 12 hours 04/21/13 1425 04/24/13 1543   04/21/13 1430  aztreonam (AZACTAM) 2 g in dextrose 5 % 50 mL IVPB  Status:  Discontinued     2 g 100 mL/hr over 30 Minutes Intravenous  Once 04/21/13 1412 04/21/13 1615        Subjective:   [name] seen and examined today.  Patient still complains of shortness of breath and cough. Denies abdominal pain, chest pain.  Anxious  about upcoming procedure.   Objective:   Filed Vitals:   04/25/13 1430 04/25/13 1644 04/25/13 2131 04/26/13 0534  BP:  125/80 134/108 136/83  Pulse:  91 84 84  Temp:  97.9 F (36.6 C) 98.9 F (37.2 C) 98.4 F (36.9 C)  TempSrc:  Oral Oral Oral  Resp:  16 18 20   Height:      Weight:   62.6 kg (138 lb 0.1 oz)   SpO2: 94% 94% 93% 92%    Wt Readings from Last 3 Encounters:  04/25/13 62.6 kg (138 lb 0.1 oz)  04/25/13 62.6 kg (138 lb 0.1 oz)  04/18/13 59.693 kg (131 lb 9.6 oz)     Intake/Output Summary (Last 24 hours) at 04/26/13 0742 Last data filed at 04/26/13 0400  Gross per 24 hour  Intake 2537.91 ml  Output      0 ml  Net 2537.91 ml    Exam  General: Well developed, well nourished, NAD, appears stated age  HEENT: NCAT, mucous membranes moist.  Neck: Supple, no JVD, no masses  Cardiovascular: S1 S2 auscultated, no rubs, murmurs or gallops. Regular rate and rhythm.  Respiratory: Coarse breath sounds, scattered crackles a few rhonchi  Abdomen: Soft, nontender, nondistended, + bowel sounds  Extremities: warm dry without cyanosis clubbing or edema  Neuro: AAOx3, No focal deficits   Skin: Without rashes exudates or nodules  Psych: Normal affect and demeanor with intact judgement and insight  Data Review   Micro Results Recent Results (from the past 240 hour(s))  CULTURE, BLOOD (ROUTINE X 2)     Status: None   Collection Time    04/21/13  2:40 PM      Result Value Ref Range Status   Specimen Description BLOOD ARM LEFT   Final   Special Requests BOTTLES DRAWN AEROBIC AND ANAEROBIC 5CC   Final   Culture  Setup Time     Final   Value: 04/21/2013 20:43     Performed at [facility]   Culture     Final   Value:        BLOOD CULTURE RECEIVED NO GROWTH TO DATE CULTURE WILL BE HELD FOR 5 DAYS BEFORE ISSUING A FINAL NEGATIVE REPORT     Performed at [facility]   Report Status PENDING   Incomplete  CULTURE, BLOOD (ROUTINE X 2)     Status: None     Collection Time    04/21/13  2:50 PM      Result Value Ref Range Status   Specimen Description BLOOD HAND LEFT   Final   Special Requests BOTTLES DRAWN AEROBIC AND ANAEROBIC 5CC   Final   Culture  Setup Time     Final   Value: 04/21/2013 20:42     Performed at [facility]   Culture     Final   Value:        BLOOD CULTURE RECEIVED NO GROWTH TO DATE CULTURE WILL BE HELD FOR 5 DAYS BEFORE ISSUING A FINAL NEGATIVE REPORT     Performed at [facility]   Report Status PENDING   Incomplete    Radiology Reports Dg Chest 2 View  04/21/2013   CLINICAL DATA:  Cough and hemoptysis  EXAM: CHEST  2 VIEW  COMPARISON:  DG CHEST 2V dated 04/14/2013  FINDINGS: There has been an interval increase in the density of the confluent infiltrate in the right lower lobe consistent with progressive pneumonia. The right middle and upper lobes appear clear. The left lung appears clear. No significant pleural effusion is demonstrated. The cardiac silhouette is normal in size. The pulmonary vascularity is not engorged. The trachea is midline. There is soft tissue fullness in the right hilar region which may reflect lymphadenopathy. This is not new.  IMPRESSION: Progressive increased density in the right lower lobe is present and is consistent with worsening atelectasis or pneumonia. Soft tissue fullness in the hilar region is suspicious for lymphadenopathy or mass. Chest CT scanning is recommended.   Electronically Signed   By: David  Martinique   On: 04/21/2013 13:48   Dg Chest 2 View  04/18/2013   CLINICAL DATA:  Cough  EXAM: CHEST  2 VIEW  COMPARISON:  02/05/2004  FINDINGS: There is dense consolidation in the right lower lobe consistent with pneumonia. Small area of mild consolidation is noted at the base of the right middle lobe.  Lungs are hyperexpanded but otherwise clear. No pleural effusion or pneumothorax.  Cardiac silhouette is normal in size. There is fullness of the right hilum suggesting reactive  adenopathy.  IMPRESSION: 1. Right lower lobe pneumonia with a small focus of right middle lobe infiltrate. Fullness of the right hilum suggests reactive right hilar adenopathy.   Electronically Signed   By: Lajean Manes M.D.   On: 04/18/2013 08:13   Ct Angio Chest W/cm &/or Wo Cm  04/21/2013   CLINICAL DATA:  Hemoptysis, shortness of breath, right chest pain.  EXAM: CT ANGIOGRAPHY CHEST WITH CONTRAST  TECHNIQUE: Multidetector CT imaging of the chest was performed using the standard protocol during bolus administration of intravenous contrast. Multiplanar CT image reconstructions and MIPs were obtained to evaluate the vascular anatomy.  CONTRAST:  187mL OMNIPAQUE IOHEXOL 350 MG/ML SOLN  COMPARISON:  DG CHEST 2 VIEW dated 04/21/2013; DG CHEST 2V dated 04/14/2013  FINDINGS: Main pulmonary artery is not enlarged. Nonocclusive segmental to subsegmental left upper lobe pulmonary embolus, bulky right hilar lymphadenopathy encases and narrows the right lower lobe pulmonary arteries, right middle lobe pulmonary vein. Nonocclusive pulmonary embolism right lower lobe segmental branches. Hilar lymphadenopathy encases the right middle lobe and lower lobe bronchi, incompletely and occludes the right lower lobe subsegmental bronchi. Patchy consolidation throughout the right lung, with interstitial prominence. More focal consolidation within the medial segment of the right lower lobe measuring 1.5 x 3.3 cm.  Mildly cystic 10 mm right peritracheal lymph nodes, a pretracheal 25 mm subcarinal 28 x 53 mm nodal conglomeration bulky  right hilar lymphadenopathy. Severe centrilobular emphysema, with apical bullous changes. No pleural effusions. No left pulmonary nodule or mass.  The heart size is not enlarged, apparent right heart strain. Pericardium is nonsuspicious. Mild Coronary artery calcifications. Thoracic aorta is normal in course and caliber thickening vessels. Included view of the abdomen is nonsuspicious appearing bilateral  acromioclavicular osteoarthrosis. .  Review of the MIP images confirms the above findings.  IMPRESSION: Nonocclusive left upper lobe, right lower lobe pulmonary emboli. Right heart strain.  Bulky mediastinal and right hilar lymphadenopathy, encasing the of pulmonary arteries, pulmonary veins and bronchi, completely effacing right lower lobe segmental bronchi. Extensive consolidation in the right lung suggests postobstructive pneumonia, with interstitial prominence which may reflect superimposed lymphangitic spread of infection or neoplasm. Constellation of findings are highly concerning for neoplastic process, with consolidated focus in the right lung base, unclear whether this reflects lung primary. Additional consideration is lymphoma. Findings less likely reflect autoimmune disorders such as sarcoid.  Critical Value/emergent results were called by telephone at the time of interpretation on 04/21/2013 at 4:00 PM to [facility], who verbally acknowledged these results.   Electronically Signed   By: Elon Alas   On: 04/21/2013 16:04    CBC  Recent Labs Lab 04/21/13 1223 04/21/13 1820 04/22/13 0406 04/23/13 0446 04/24/13 0629 04/25/13 0405 04/26/13 0440  WBC 13.2* 12.3* 15.5* 13.7* 10.4 9.0 10.5  HGB 13.4 12.2* 11.4* 11.3* 11.0* 10.8* 11.7*  HCT 38.6* 35.3* 33.4* 32.8* 32.8* 31.7* 34.1*  PLT 274 265 251 277 284 260 294  MCV 92.1 93.9 93.0 94.0 93.2 93.0 94.2  MCH 32.0 32.4 31.8 32.4 31.3 31.7 32.3  MCHC 34.7 34.6 34.1 34.5 33.5 34.1 34.3  RDW 11.6 11.7 11.8 12.2 12.1 12.1 12.0  LYMPHSABS 1.1 0.3*  --   --   --   --   --   MONOABS 1.6* 0.2  --   --   --   --   --   EOSABS 0.0 0.0  --   --   --   --   --   BASOSABS 0.0 0.0  --   --   --   --   --     Chemistries   Recent Labs Lab 04/21/13 1223 04/21/13 1820 04/22/13 0406  NA 136* 135* 137  K 4.5 4.2 3.9  CL 97 97 102  CO2 25 20 22   GLUCOSE 112* 210* 216*  BUN 16 15 17   CREATININE 1.07 1.04 0.96  CALCIUM 9.2 8.5  8.7  MG  --  1.8  --   AST 18 17 14   ALT 14 13 10   ALKPHOS 87 78 70  BILITOT 0.8 0.4 0.3   ------------------------------------------------------------------------------------------------------------------ estimated creatinine clearance is 71.5 ml/min (by C-G formula based on Cr of 0.96). ------------------------------------------------------------------------------------------------------------------ No results found for this basename: HGBA1C,  in the last 72 hours ------------------------------------------------------------------------------------------------------------------ No results found for this basename: CHOL, HDL, LDLCALC, TRIG, CHOLHDL, LDLDIRECT,  in the last 72 hours ------------------------------------------------------------------------------------------------------------------ No results found for this basename: TSH, T4TOTAL, FREET3, T3FREE, THYROIDAB,  in the last 72 hours ------------------------------------------------------------------------------------------------------------------ No results found for this basename: VITAMINB12, FOLATE, FERRITIN, TIBC, IRON, RETICCTPCT,  in the last 72 hours  Coagulation profile  Recent Labs Lab 04/21/13 1820  INR 1.17    No results found for this basename: DDIMER,  in the last 72 hours  Cardiac Enzymes  Recent Labs Lab 04/21/13 1224  TROPONINI <0.30   ------------------------------------------------------------------------------------------------------------------ No components found with this basename: POCBNP,  Nury Nebergall D.O. on 04/26/2013 at 7:42 AM  Between 7am to 7pm - Pager - [phone]  After 7pm go to [email or web address] - password [email or web address]  And look for the night coverage person covering for me after hours  [facility] Office  [phone]

## 2013-04-26 NOTE — Progress Notes (Signed)
ANTICOAGULATION CONSULT NOTE - Follow Up Consult  Pharmacy Consult for Heparin Indication: pulmonary embolus (bilateral)  Allergies  Allergen Reactions  . Augmentin [Amoxicillin-Pot Clavulanate] Itching and Rash    Patient Measurements: Height: 5\' 7"  (170.2 cm) Weight: 138 lb (62.596 kg) IBW/kg (Calculated) : 66.1 Heparin Dosing Weight: 60 kg  Vital Signs: Temp: 97.7 F (36.5 C) (03/03 0834) Temp src: Oral (03/03 0834) BP: 116/82 mmHg (03/03 1230) Pulse Rate: 93 (03/03 1230)  Labs:  Recent Labs  04/24/13 0629 04/25/13 0405 04/25/13 1201 04/26/13 0440  HGB 11.0* 10.8*  --  11.7*  HCT 32.8* 31.7*  --  34.1*  PLT 284 260  --  294  HEPARINUNFRC 0.33 <0.10* 0.68  --     Estimated Creatinine Clearance: 71.5 ml/min (by C-G formula based on Cr of 0.96).   Medications:  Scheduled:  . ceFEPime (MAXIPIME) IV  2 g Intravenous Q12H  . cholecalciferol  2,000 Units Oral Daily  . famotidine  20 mg Oral BID  . ipratropium-albuterol  3 mL Nebulization BID  . lisinopril  10 mg Oral Daily  . pantoprazole  40 mg Oral Daily  . vancomycin  1,000 mg Intravenous Q12H   Infusions:  . heparin      Assessment: 63 yo M on heparin for bilateral pulmonary emboli.  Heparin was on hold today from 0630-1500 for bronch procedure.  Now restarting heparin after procedure at previously therapeutic rate.  Goal of Therapy:  Heparin level 0.3-0.7 units/ml Monitor platelets by anticoagulation protocol: Yes   Plan:  Restart heparin at 1800 units/hr. Heparin level at 2300 tonight. Follow-up plans for oral anticoagulation. Continue daily heparin level and CBC.  [employer], Pharm.D., BCPS Clinical Pharmacist Pager [phone] 04/26/2013 3:15 PM

## 2013-04-26 NOTE — Op Note (Signed)
Indication:  Lung mass   Premedication:  Nebulized lidocaine Topical viscous lidocaine to B nares   Sedation:  Fentanyl 75 mcg Midaz 6 mg  Anesthesia: 50 cc 1% lidocaine  Procedure: After adequate sedation and anesthesia, the bronchoscope was introduced via the R naris and advanced into the posterior pharynx. Further anesthesia was obtained with 1% lidocaine and the scope was advanced into the trachea. Complete airway anesthesia was achieved with 1% lidocaine and a thorough airway examination was performed. This revealed the following.  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

## 2013-04-27 ENCOUNTER — Encounter (HOSPITAL_COMMUNITY): Payer: Self-pay | Admitting: Pulmonary Disease

## 2013-04-27 DIAGNOSIS — C3490 Malignant neoplasm of unspecified part of unspecified bronchus or lung: Secondary | ICD-10-CM

## 2013-04-27 LAB — CBC
HCT: 33.8 % — ABNORMAL LOW (ref 39.0–52.0)
HEMOGLOBIN: 11.6 g/dL — AB (ref 13.0–17.0)
MCH: 32.3 pg (ref 26.0–34.0)
MCHC: 34.3 g/dL (ref 30.0–36.0)
MCV: 94.2 fL (ref 78.0–100.0)
Platelets: 334 10*3/uL (ref 150–400)
RBC: 3.59 MIL/uL — ABNORMAL LOW (ref 4.22–5.81)
RDW: 11.8 % (ref 11.5–15.5)
WBC: 13.2 10*3/uL — ABNORMAL HIGH (ref 4.0–10.5)

## 2013-04-27 LAB — CULTURE, BLOOD (ROUTINE X 2)
CULTURE: NO GROWTH
Culture: NO GROWTH

## 2013-04-27 LAB — GLUCOSE, CAPILLARY: Glucose-Capillary: 94 mg/dL (ref 70–99)

## 2013-04-27 LAB — HEPARIN LEVEL (UNFRACTIONATED): HEPARIN UNFRACTIONATED: 0.55 [IU]/mL (ref 0.30–0.70)

## 2013-04-27 MED ORDER — LEVOFLOXACIN 750 MG PO TABS
750.0000 mg | ORAL_TABLET | Freq: Every day | ORAL | Status: DC
  Administered 2013-04-27 – 2013-04-29 (×3): 750 mg via ORAL
  Filled 2013-04-27 (×4): qty 1

## 2013-04-27 MED ORDER — RIVAROXABAN 15 MG PO TABS
15.0000 mg | ORAL_TABLET | Freq: Two times a day (BID) | ORAL | Status: DC
  Administered 2013-04-27 – 2013-04-29 (×4): 15 mg via ORAL
  Filled 2013-04-27 (×6): qty 1

## 2013-04-27 NOTE — Progress Notes (Signed)
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  I have informed pt of this diagnosis and discussed its ramifications. I have placed call to Oncology to request consultation  He is back on heparin since last night and denies significant hemoptysis  Vitals signs reviewed. He is in no distress. Otherwise, exam was not repeated today  IMP: Small PE by CTA chest Extensive infiltrative changes in RLL - doubt significant active infection. More inclined to think this is spread of malignancy throughout RLL New dx of small cell ca of lung   PLAN: Cont anticoagulation - OK to convert to oral options any time. Xarelto might be optimal choice in setting of malignancy related VTE Change abx to levofloxacin and complete 5 more days Oncology consultation has been called and am awaiting call back  PCCM will sign off. Please call if we can be of further assistance  Merton Border, MD ; [facility] [phone].  After 5:30 PM or weekends, call [phone]

## 2013-04-27 NOTE — Progress Notes (Signed)
Utilization review completed.  

## 2013-04-27 NOTE — Progress Notes (Signed)
ANTICOAGULATION CONSULT NOTE - Follow Up Consult  Pharmacy Consult for Heparin  Indication: pulmonary embolus  Allergies  Allergen Reactions  . Augmentin [Amoxicillin-Pot Clavulanate] Itching and Rash    Patient Measurements: Height: 5\' 7"  (170.2 cm) Weight: 130 lb 4.7 oz (59.1 kg) IBW/kg (Calculated) : 66.1  Vital Signs: Temp: 98.1 F (36.7 C) (03/03 2114) Temp src: Oral (03/03 2114) BP: 123/68 mmHg (03/03 2114) Pulse Rate: 95 (03/03 2114)  Labs:  Recent Labs  04/24/13 0629 04/25/13 0405 04/25/13 1201 04/26/13 0440 04/26/13 2235  HGB 11.0* 10.8*  --  11.7*  --   HCT 32.8* 31.7*  --  34.1*  --   PLT 284 260  --  294  --   HEPARINUNFRC 0.33 <0.10* 0.68  --  0.25*    Estimated Creatinine Clearance: 67.5 ml/min (by C-G formula based on Cr of 0.96).   Medications:  Heparin 1800 units/hr  Assessment: 63 y/o M on heparin for bilateral PE's. Heparin re-started earlier this evening after being held for procedure. HL is 0.25. Other labs as above.   Goal of Therapy:  Heparin level 0.3-0.7 units/ml Monitor platelets by anticoagulation protocol: Yes   Plan:  -Increase heparin to 1900 units/hr -0800 HL -Daily CBC/HL -Monitor for bleeding  Narda Bonds 04/27/2013,12:12 AM

## 2013-04-27 NOTE — Progress Notes (Signed)
[facility] PROGRESS NOTE  Ricky Villanueva TML:[id number] DOB: 05-17-1950 DOA: 04/21/2013 PCP: [name], MD  Assessment/Plan: Respiratory failure with hypoxia  -Multifactorial causes: pneumonia, questionable postobstructive pneumonia, PE  -CXR: progressive RLL airspace disease and soft tissue fullness in right hilar region  -CTA Chest: non-occlusive PE in LUL and RLL  -continue vancomycin and cefepime, duoneb treatments, supplemental oxygen to maintain saturations above 92%  -Blood cultures negative to date, strep pneumonia and influenza negative  -Patient remains clinically stable on 2 L nasal cannula lung mass, right lung base  -CT Chest: The mediastinal and right hilar lymphadenopathy; extensive consolidation right lung suggestive of postobstructive pneumonia versus lymphangitic spread -bronchoscopy with biopsy 04/26/2013--await pathology  -Patient has extensive smoking history--quit 2013  -case discussed with Dr. Juliann Mule for consultation -Will need further imaging for staging Post-obstructive pneumonia  -Treatment and plan as stated above  -Finished 6 days vancomycin/cefepime -Appreciate pulmonary recommendations--switched to oral levofloxacin x5 days Pulmonary embolism  -Discontinue heparin drip, start rivaroxaban -Lower Ext doppler negative for DVT  Hemoptysis  -likely due to cough and straining related to cough  -continue to monitor for other episodes of hemoptysis  GERD (gastroesophageal reflux disease)  -Continue protonix and Pepcid (home medications)  Hypertension  -Continue lisinopril  Code Status: Full  Family Communication: wife and daughter at bedside Disposition Plan: Admitted  Time Spent in minutes 35 minutes  Procedures  Bronchoscopy with biopsy 04/26/13     Antibiotics:  Vancomycin 04/21/2013>>> 04/27/2013  cefepime 04/22/13>>>04/27/13  Levofloxacin 04/27/13>>>    Procedures/Studies: Dg Chest 2 View  04/21/2013   CLINICAL DATA:  Cough and  hemoptysis  EXAM: CHEST  2 VIEW  COMPARISON:  DG CHEST 2V dated 04/14/2013  FINDINGS: There has been an interval increase in the density of the confluent infiltrate in the right lower lobe consistent with progressive pneumonia. The right middle and upper lobes appear clear. The left lung appears clear. No significant pleural effusion is demonstrated. The cardiac silhouette is normal in size. The pulmonary vascularity is not engorged. The trachea is midline. There is soft tissue fullness in the right hilar region which may reflect lymphadenopathy. This is not new.  IMPRESSION: Progressive increased density in the right lower lobe is present and is consistent with worsening atelectasis or pneumonia. Soft tissue fullness in the hilar region is suspicious for lymphadenopathy or mass. Chest CT scanning is recommended.   Electronically Signed   By: Debbra Digiulio  [place]   On: 04/21/2013 13:48   Dg Chest 2 View  04/18/2013   CLINICAL DATA:  Cough  EXAM: CHEST  2 VIEW  COMPARISON:  02/05/2004  FINDINGS: There is dense consolidation in the right lower lobe consistent with pneumonia. Small area of mild consolidation is noted at the base of the right middle lobe.  Lungs are hyperexpanded but otherwise clear. No pleural effusion or pneumothorax.  Cardiac silhouette is normal in size. There is fullness of the right hilum suggesting reactive adenopathy.  IMPRESSION: 1. Right lower lobe pneumonia with a small focus of right middle lobe infiltrate. Fullness of the right hilum suggests reactive right hilar adenopathy.   Electronically Signed   By: Lajean Manes M.D.   On: 04/18/2013 08:13   Ct Angio Chest W/cm &/or Wo Cm  04/21/2013   CLINICAL DATA:  Hemoptysis, shortness of breath, right chest pain.  EXAM: CT ANGIOGRAPHY CHEST WITH CONTRAST  TECHNIQUE: Multidetector CT imaging of the chest was performed using the standard protocol during bolus administration of intravenous contrast. Multiplanar CT image reconstructions  and MIPs were  obtained to evaluate the vascular anatomy.  CONTRAST:  169mL OMNIPAQUE IOHEXOL 350 MG/ML SOLN  COMPARISON:  DG CHEST 2 VIEW dated 04/21/2013; DG CHEST 2V dated 04/14/2013  FINDINGS: Main pulmonary artery is not enlarged. Nonocclusive segmental to subsegmental left upper lobe pulmonary embolus, bulky right hilar lymphadenopathy encases and narrows the right lower lobe pulmonary arteries, right middle lobe pulmonary vein. Nonocclusive pulmonary embolism right lower lobe segmental branches. Hilar lymphadenopathy encases the right middle lobe and lower lobe bronchi, incompletely and occludes the right lower lobe subsegmental bronchi. Patchy consolidation throughout the right lung, with interstitial prominence. More focal consolidation within the medial segment of the right lower lobe measuring 1.5 x 3.3 cm.  Mildly cystic 10 mm right peritracheal lymph nodes, a pretracheal 25 mm subcarinal 28 x 53 mm nodal conglomeration bulky right hilar lymphadenopathy. Severe centrilobular emphysema, with apical bullous changes. No pleural effusions. No left pulmonary nodule or mass.  The heart size is not enlarged, apparent right heart strain. Pericardium is nonsuspicious. Mild Coronary artery calcifications. Thoracic aorta is normal in course and caliber thickening vessels. Included view of the abdomen is nonsuspicious appearing bilateral acromioclavicular osteoarthrosis. .  Review of the MIP images confirms the above findings.  IMPRESSION: Nonocclusive left upper lobe, right lower lobe pulmonary emboli. Right heart strain.  Bulky mediastinal and right hilar lymphadenopathy, encasing the of pulmonary arteries, pulmonary veins and bronchi, completely effacing right lower lobe segmental bronchi. Extensive consolidation in the right lung suggests postobstructive pneumonia, with interstitial prominence which may reflect superimposed lymphangitic spread of infection or neoplasm. Constellation of findings are highly concerning for  neoplastic process, with consolidated focus in the right lung base, unclear whether this reflects lung primary. Additional consideration is lymphoma. Findings less likely reflect autoimmune disorders such as sarcoid.  Critical Value/emergent results were called by telephone at the time of interpretation on 04/21/2013 at 4:00 PM to [facility] PA, who verbally acknowledged these results.   Electronically Signed   By: Elon Alas   On: 04/21/2013 16:04         Subjective:  Patient denies any fevers, chills, chest discomfort. He still has a scant amount of hemoptysis. Denies any hematemesis, abdominal pain, vomiting, diarrhea, dysuria and hematuria. No dizziness or headache. He has shortness of breath with exertion.  Objective: Filed Vitals:   04/26/13 2114 04/27/13 0453 04/27/13 0802 04/27/13 1203  BP:  120/77 119/74 126/67  Pulse:  83 82 90  Temp:  98 F (36.7 C) 97.8 F (36.6 C) 97.6 F (36.4 C)  TempSrc:  Oral Oral Oral  Resp:  18 19 18   Height:      Weight: 59.1 kg (130 lb 4.7 oz)     SpO2:  94% 92% 85%   No intake or output data in the 24 hours ending 04/27/13 1830 Weight change: -0.004 kg (-0.1 oz) Exam:   General:  Pt is alert, follows commands appropriately, not in acute distress  HEENT: No icterus, No thrush,  Harmony/AT  Cardiovascular: RRR, S1/S2, no rubs, no gallops  Respiratory: bilateral crackles, right greater than left. No wheezing. Good air movement.   Abdomen: Soft/+BS, non tender, non distended, no guarding  Extremities: No edema, No lymphangitis, No petechiae, No rashes, no synovitis  Data Reviewed: Basic Metabolic Panel:  Recent Labs Lab 04/21/13 1223 04/21/13 1820 04/22/13 0406  NA 136* 135* 137  K 4.5 4.2 3.9  CL 97 97 102  CO2 25 20 22   GLUCOSE 112* 210* 216*  BUN 16 15 17   CREATININE 1.07 1.04 0.96  CALCIUM 9.2 8.5 8.7  MG  --  1.8  --   PHOS  --  2.3  --    Liver Function Tests:  Recent Labs Lab 04/21/13 1223  04/21/13 1820 04/22/13 0406  AST 18 17 14   ALT 14 13 10   ALKPHOS 87 78 70  BILITOT 0.8 0.4 0.3  PROT 7.2 6.5 6.1  ALBUMIN 3.3* 2.8* 2.6*   No results found for this basename: LIPASE, AMYLASE,  in the last 168 hours No results found for this basename: AMMONIA,  in the last 168 hours CBC:  Recent Labs Lab 04/21/13 1223 04/21/13 1820  04/23/13 0446 04/24/13 0629 04/25/13 0405 04/26/13 0440 04/27/13 0543  WBC 13.2* 12.3*  < > 13.7* 10.4 9.0 10.5 13.2*  NEUTROABS 10.5* 11.8*  --   --   --   --   --   --   HGB 13.4 12.2*  < > 11.3* 11.0* 10.8* 11.7* 11.6*  HCT 38.6* 35.3*  < > 32.8* 32.8* 31.7* 34.1* 33.8*  MCV 92.1 93.9  < > 94.0 93.2 93.0 94.2 94.2  PLT 274 265  < > 277 284 260 294 334  < > = values in this interval not displayed. Cardiac Enzymes:  Recent Labs Lab 04/21/13 1224  TROPONINI <0.30   BNP: No components found with this basename: POCBNP,  CBG:  Recent Labs Lab 04/22/13 0842 04/26/13 0745 04/27/13 0753  GLUCAP 156* 91 94    Recent Results (from the past 240 hour(s))  CULTURE, BLOOD (ROUTINE X 2)     Status: None   Collection Time    04/21/13  2:40 PM      Result Value Ref Range Status   Specimen Description BLOOD ARM LEFT   Final   Special Requests BOTTLES DRAWN AEROBIC AND ANAEROBIC 5CC   Final   Culture  Setup Time     Final   Value: 04/21/2013 20:43     Performed at [facility]   Culture     Final   Value: NO GROWTH 5 DAYS     Performed at [facility]   Report Status 04/27/2013 FINAL   Final  CULTURE, BLOOD (ROUTINE X 2)     Status: None   Collection Time    04/21/13  2:50 PM      Result Value Ref Range Status   Specimen Description BLOOD HAND LEFT   Final   Special Requests BOTTLES DRAWN AEROBIC AND ANAEROBIC 5CC   Final   Culture  Setup Time     Final   Value: 04/21/2013 20:42     Performed at [facility]   Culture     Final   Value: NO GROWTH 5 DAYS     Performed at [facility]   Report Status  04/27/2013 FINAL   Final  SURGICAL PCR SCREEN     Status: None   Collection Time    04/26/13  6:02 AM      Result Value Ref Range Status   MRSA, PCR NEGATIVE  NEGATIVE Final   Staphylococcus aureus NEGATIVE  NEGATIVE Final   Comment:            The Xpert SA Assay (FDA     approved for NASAL specimens     in patients over 86 years of age),     is one component of     a comprehensive surveillance  program.  Test performance has     been validated by [facility] for patients greater     than or equal to 65 year old.     It is not intended     to diagnose infection nor to     guide or monitor treatment.     Scheduled Meds: . cholecalciferol  2,000 Units Oral Daily  . famotidine  20 mg Oral BID  . ipratropium-albuterol  3 mL Nebulization BID  . levofloxacin  750 mg Oral Daily  . lisinopril  10 mg Oral Daily  . pantoprazole  40 mg Oral Daily  . Rivaroxaban  15 mg Oral BID WC   Continuous Infusions:    Maanya Hippert, DO  [facility] Pager [phone]  If 7PM-7AM, please contact night-coverage [email or web address] Password [email or web address] 04/27/2013, 6:30 PM   LOS: 6 days

## 2013-04-27 NOTE — Consult Note (Signed)
Media [phone]   [phone]  INPATIENT CONSULT NOTE  REFERRING PHYSICIAN: Zella Richer. [facility]  REASON FOR CONSULTATION:  New dx of small cell ca of lung  HPI Ricky Villanueva is a 63 y.o. male with a past medical history of HTN/Hyperlipidemia and longstanding smoking history but quit 12/25/2011 presented to ER on 02/26 with worsening dyspnea, cough with streaks of blood over the past one week.  In the ED, his oxygen saturation was 91% on 2 L nasal canula.  CXR revealed a progressive increased density in the right lower lobe consistent with worsening atelectasis or pneumonia and a soft tissue fullness in the hilar region suspicious for lymphadenopathy or mass.and follow-up CT of chest was recommended.  It revealed a nonocclusive left upper lobe, right lower lobe pulmonary emboli with right heart strain.  Bulky mediastinal and right hilar lymphadenopathy, encasing the of pulmonary arteries, pulmonary veins and bronchi, completely effacing right lower lobe segmental bronchi. Extensive consolidation in the right lung suggests postobstructive pneumonia, with interstitial prominence which may reflect superimposed lymphangitic spread of infection or neoplasm. He was admitted and started on antibiotics and heparin.  Pulmonary was consulted and he had a video bronchoscopy on 03/03 with preliminary findings consistent with small cell ca of lung.    Patient reports smoking from age 80 until 2013 with a 2 pack per day history over the past 20 years.  Today, he is accompanied by his wife Ricky Villanueva and daughter Ricky Villanueva.  He lives about 21 miles from [place], [place].  This is his first hospital admission ever.  He generally follow up with his PCP on an as needed basis.  He reports that his weight is stable.  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  He states that it requiring frequently up steps  daily and over the past one week he has been unable due to the dyspnea.  He also report hoarseness over the past one week.  His family history is notable for a sister with breast cancer diagnosed at age 15.    HPI  Past Medical History  Diagnosis Date  . Hyperlipidemia   . Hypertension   . GERD (gastroesophageal reflux disease)   . Respiratory failure with hypoxia 04/21/2013    secondary to pneumonia/notes 04/21/2013  . Pulmonary embolism     "got one in there now" (04/21/2013)  . COPD (chronic obstructive pulmonary disease)   . Pneumonia 2/?01/2014    "wouldn't get better; hospitalized 04/21/2013)  . Arthritis     "minor in my right hand" (04/21/2013)    Past Surgical History  Procedure Laterality Date  . Inguinal hernia repair Right ~ 2005  . Finger surgery Left ~ 1989    "crushed end of my middle finger off"   . Video bronchoscopy Bilateral 04/26/2013    Procedure: VIDEO BRONCHOSCOPY WITH FLUORO;  Surgeon: Wilhelmina Mcardle, MD;  Location: [facility] ENDOSCOPY;  Service: Cardiopulmonary;  Laterality: Bilateral;    History reviewed. No pertinent family history.  Social History History  Substance Use Topics  . Smoking status: Former Smoker -- 2.00 packs/day for 47 years    Types: Cigarettes    Quit date: 12/25/2011  . Smokeless tobacco: Never Used  . Alcohol Use: Yes     Comment: 04/21/2013 "used to drink a little bit; very little; nothing in years"    Allergies  Allergen Reactions  .  Augmentin [Amoxicillin-Pot Clavulanate] Itching and Rash    Current Facility-Administered Medications  Medication Dose Route Frequency Provider Last Rate Last Dose  . acetaminophen (TYLENOL) tablet 650 mg  650 mg Oral Q6H PRN Robbie Lis, MD       Or  . acetaminophen (TYLENOL) suppository 650 mg  650 mg Rectal Q6H PRN Robbie Lis, MD      . cholecalciferol (VITAMIN D) tablet 2,000 Units  2,000 Units Oral Daily Robbie Lis, MD   2,000 Units at 04/27/13 0940  . famotidine (PEPCID) tablet 20 mg   20 mg Oral BID Robbie Lis, MD   20 mg at 04/27/13 0940  . guaiFENesin-codeine (ROBITUSSIN AC) 100-10 MG/5ML syrup 5 mL  5 mL Oral TID PRN Robbie Lis, MD      . HYDROcodone-acetaminophen (NORCO/VICODIN) 5-325 MG per tablet 1-2 tablet  1-2 tablet Oral Q4H PRN Robbie Lis, MD      . ipratropium-albuterol (DUONEB) 0.5-2.5 (3) MG/3ML nebulizer solution 3 mL  3 mL Nebulization BID Maryann Mikhail, DO   3 mL at 04/27/13 6295  . levofloxacin (LEVAQUIN) tablet 750 mg  750 mg Oral Daily Wilhelmina Mcardle, MD      . lisinopril (PRINIVIL,ZESTRIL) tablet 10 mg  10 mg Oral Daily Robbie Lis, MD   10 mg at 04/27/13 0940  . ondansetron (ZOFRAN) tablet 4 mg  4 mg Oral Q6H PRN Robbie Lis, MD       Or  . ondansetron [facility]) injection 4 mg  4 mg Intravenous Q6H PRN Robbie Lis, MD   4 mg at 04/27/13 0046  . pantoprazole (PROTONIX) EC tablet 40 mg  40 mg Oral Daily Robbie Lis, MD   40 mg at 04/27/13 0940  . Rivaroxaban (XARELTO) tablet 15 mg  15 mg Oral BID WC Orson Eva, MD        Review of Systems  Constitutional: positive for fatigue, fevers and malaise, negative for night sweats and weight loss Eyes: positive for irritation and visual disturbance, negative for icterus Ears, nose, mouth, throat, and face: positive for epistaxis, negative for hoarseness Respiratory: positive for cough, dyspnea on exertion, hemoptysis and pleurisy/chest pain, negative for stridor and wheezing Cardiovascular: negative for chest pain and irregular heart beat Gastrointestinal: negative for abdominal pain, change in bowel habits, dysphagia and jaundice Genitourinary:negative for dysuria and hematuria Hematologic/lymphatic:  negative for bleeding and lymphadenopathy Musculoskeletal:positive for muscle weakness Neurological: negative for dizziness and gait problems  Physical Exam  MWU:XLKGM, healthy, well nourished, well developed, anxious, cooperative and mild distress SKIN: skin color, texture, turgor are  normal HEAD: Normocephalic, No masses, lesions, tenderness or abnormalities EYES: PERRLA, EOMI, sclera clear EARS: External ears normal OROPHARYNX:no exudate, dentition normal and mucous membranes are moist  NECK: supple, no adenopathy LYMPH:  no palpable lymphadenopathy LUNGS: decreased breath sounds on the R, CTA on L.  HEART: tachycardia, S1 normal and S2 normal ABDOMEN:normal bowel sounds and no masses or organomegaly BACK: Back symmetric, no curvature. EXTREMITIES:no edema, no clubbing  NEURO: alert & oriented x 3 with fluent speech, no focal motor/sensory deficits  PERFORMANCE STATUS: ECOG 1  LABORATORY DATA: Lab Results  Component Value Date   WBC 13.2* 04/27/2013   HGB 11.6* 04/27/2013   HCT 33.8* 04/27/2013   MCV 94.2 04/27/2013   PLT 334 04/27/2013   CMP     Component Value Date/Time   NA 137 04/22/2013 0406   K 3.9 04/22/2013 0406   CL 102  04/22/2013 0406   CO2 22 04/22/2013 0406   GLUCOSE 216* 04/22/2013 0406   BUN 17 04/22/2013 0406   CREATININE 0.96 04/22/2013 0406   CALCIUM 8.7 04/22/2013 0406   PROT 6.1 04/22/2013 0406   ALBUMIN 2.6* 04/22/2013 0406   AST 14 04/22/2013 0406   ALT 10 04/22/2013 0406   ALKPHOS 70 04/22/2013 0406   BILITOT 0.3 04/22/2013 0406   GFRNONAA 88* 04/22/2013 0406   GFRAA >90 04/22/2013 0406    RADIOGRAPHIC STUDIES: Dg Chest 2 View (Personally reviewed by me)  04/21/2013   CLINICAL DATA:  Cough and hemoptysis  EXAM: CHEST  2 VIEW  COMPARISON:  DG CHEST 2V dated 04/14/2013  FINDINGS: There has been an interval increase in the density of the confluent infiltrate in the right lower lobe consistent with progressive pneumonia. The right middle and upper lobes appear clear. The left lung appears clear. No significant pleural effusion is demonstrated. The cardiac silhouette is normal in size. The pulmonary vascularity is not engorged. The trachea is midline. There is soft tissue fullness in the right hilar region which may reflect lymphadenopathy. This is not new.   IMPRESSION: Progressive increased density in the right lower lobe is present and is consistent with worsening atelectasis or pneumonia. Soft tissue fullness in the hilar region is suspicious for lymphadenopathy or mass. Chest CT scanning is recommended.   Electronically Signed   By: Abree Romick  [place]   On: 04/21/2013 13:48   Dg Chest 2 View (Personally reviewed by me)  04/18/2013   CLINICAL DATA:  Cough  EXAM: CHEST  2 VIEW  COMPARISON:  02/05/2004  FINDINGS: There is dense consolidation in the right lower lobe consistent with pneumonia. Small area of mild consolidation is noted at the base of the right middle lobe.  Lungs are hyperexpanded but otherwise clear. No pleural effusion or pneumothorax.  Cardiac silhouette is normal in size. There is fullness of the right hilum suggesting reactive adenopathy.  IMPRESSION: 1. Right lower lobe pneumonia with a small focus of right middle lobe infiltrate. Fullness of the right hilum suggests reactive right hilar adenopathy.   Electronically Signed   By: Lajean Manes M.D.   On: 04/18/2013 08:13   Ct Angio Chest W/cm &/or Wo Cm (personally reviewed by me)  04/21/2013   CLINICAL DATA:  Hemoptysis, shortness of breath, right chest pain.  EXAM: CT ANGIOGRAPHY CHEST WITH CONTRAST  TECHNIQUE: Multidetector CT imaging of the chest was performed using the standard protocol during bolus administration of intravenous contrast. Multiplanar CT image reconstructions and MIPs were obtained to evaluate the vascular anatomy.  CONTRAST:  136mL OMNIPAQUE IOHEXOL 350 MG/ML SOLN  COMPARISON:  DG CHEST 2 VIEW dated 04/21/2013; DG CHEST 2V dated 04/14/2013  FINDINGS: Main pulmonary artery is not enlarged. Nonocclusive segmental to subsegmental left upper lobe pulmonary embolus, bulky right hilar lymphadenopathy encases and narrows the right lower lobe pulmonary arteries, right middle lobe pulmonary vein. Nonocclusive pulmonary embolism right lower lobe segmental branches. Hilar lymphadenopathy  encases the right middle lobe and lower lobe bronchi, incompletely and occludes the right lower lobe subsegmental bronchi. Patchy consolidation throughout the right lung, with interstitial prominence. More focal consolidation within the medial segment of the right lower lobe measuring 1.5 x 3.3 cm.  Mildly cystic 10 mm right peritracheal lymph nodes, a pretracheal 25 mm subcarinal 28 x 53 mm nodal conglomeration bulky right hilar lymphadenopathy. Severe centrilobular emphysema, with apical bullous changes. No pleural effusions. No left pulmonary nodule or mass.  The heart  size is not enlarged, apparent right heart strain. Pericardium is nonsuspicious. Mild Coronary artery calcifications. Thoracic aorta is normal in course and caliber thickening vessels. Included view of the abdomen is nonsuspicious appearing bilateral acromioclavicular osteoarthrosis. .  Review of the MIP images confirms the above findings.  IMPRESSION: Nonocclusive left upper lobe, right lower lobe pulmonary emboli. Right heart strain.  Bulky mediastinal and right hilar lymphadenopathy, encasing the of pulmonary arteries, pulmonary veins and bronchi, completely effacing right lower lobe segmental bronchi. Extensive consolidation in the right lung suggests postobstructive pneumonia, with interstitial prominence which may reflect superimposed lymphangitic spread of infection or neoplasm. Constellation of findings are highly concerning for neoplastic process, with consolidated focus in the right lung base, unclear whether this reflects lung primary. Additional consideration is lymphoma. Findings less likely reflect autoimmune disorders such as sarcoid.  Critical Value/emergent results were called by telephone at the time of interpretation on 04/21/2013 at 4:00 PM to [facility] PA, who verbally acknowledged these results.   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  ECOG 1.  No weight loss.    We reviewed his images and preliminary cytology results consistent with the above.  We spent extensive time discussing cancer and how it is staged and treatment options including radiation and/or chemotherapy.   We reviewed some general prognostic indicators include lack of weight lost, functional status.  He denies hearing problems or  Kidney problems or numbness or tingling of feet or hands.   RECOMMENDATIONS  1. Stage with MRI of brain and CT of abdomen and pelvis.  If limited SCC of lung (stage I - III), we will order an PET/CT prior to chemotherapy.   2. Please consult radiation for consideration of palliative XRT given history of hemoptysis. 3. We will facilitate close follow-up to start chemotherapy with Etoposide/cisplatin.  4. Continued smoking cessation.   The patient voices understanding of current disease status and treatment options and is in agreement with the current care plan.  All questions were answered. The patient knows to call the clinic with any problems, questions or concerns. We can certainly see the patient much sooner if necessary.  Thank you so much for allowing me to participate in the care of ROHITH FAUTH. I will continue to follow up the patient with you and assist in his care.  I spent 40 minutes counseling the patient face to face. The total time spent in the appointment was 60 minutes.  Baltasar Twilley 04/27/2013, 6:01 PM

## 2013-04-27 NOTE — Progress Notes (Signed)
Spoke with pt regarding oxygen level, pt states he had visitors and was doing a lot of talking, did not want oxygen on at that time. Oxygen replaced, SpO2 to 95% with 2L. Will continue to monitor.

## 2013-04-27 NOTE — Progress Notes (Signed)
ANTICOAGULATION CONSULT NOTE - Follow Up Consult  Pharmacy Consult for Heparin Indication: pulmonary embolus (bilateral)  Allergies  Allergen Reactions  . Augmentin [Amoxicillin-Pot Clavulanate] Itching and Rash    Patient Measurements: Height: 5\' 7"  (170.2 cm) Weight: 130 lb 4.7 oz (59.1 kg) IBW/kg (Calculated) : 66.1 Heparin Dosing Weight: 60 kg  Vital Signs: Temp: 97.8 F (36.6 C) (03/04 0802) Temp src: Oral (03/04 0802) BP: 119/74 mmHg (03/04 0802) Pulse Rate: 82 (03/04 0802)  Labs:  Recent Labs  04/25/13 0405 04/25/13 1201 04/26/13 0440 04/26/13 2235 04/27/13 0543 04/27/13 0755  HGB 10.8*  --  11.7*  --  11.6*  --   HCT 31.7*  --  34.1*  --  33.8*  --   PLT 260  --  294  --  334  --   HEPARINUNFRC <0.10* 0.68  --  0.25*  --  0.55    Estimated Creatinine Clearance: 67.5 ml/min (by C-G formula based on Cr of 0.96).   Medications:  Scheduled:  . ceFEPime (MAXIPIME) IV  2 g Intravenous Q12H  . cholecalciferol  2,000 Units Oral Daily  . famotidine  20 mg Oral BID  . ipratropium-albuterol  3 mL Nebulization BID  . lisinopril  10 mg Oral Daily  . pantoprazole  40 mg Oral Daily  . vancomycin  1,000 mg Intravenous Q12H   Infusions:  . heparin 1,900 Units/hr (04/27/13 0254)    Assessment: 63 yo M on heparin for bilateral pulmonary emboli.  Heparin is now therapeutic on 1900 units/hr.  Pt is s/p bronch/biopsy.  Will follow-up plans for starting oral anticoagulation.  Goal of Therapy:  Heparin level 0.3-0.7 units/ml Monitor platelets by anticoagulation protocol: Yes   Plan:  Continue heparin at 1900 units/hr. Follow-up plans for oral anticoagulation. Continue daily heparin level and CBC.  [employer], Pharm.D., BCPS Clinical Pharmacist Pager [phone] 04/27/2013 11:05 AM

## 2013-04-28 ENCOUNTER — Ambulatory Visit
Admit: 2013-04-28 | Discharge: 2013-04-28 | Disposition: A | Discharge status: Home / Self Care | Payer: BC Managed Care – PPO | Attending: Radiation Oncology | Admitting: Radiation Oncology

## 2013-04-28 ENCOUNTER — Inpatient Hospital Stay (HOSPITAL_COMMUNITY): Payer: BC Managed Care – PPO

## 2013-04-28 LAB — CBC
HCT: 31 % — ABNORMAL LOW (ref 39.0–52.0)
Hemoglobin: 10.5 g/dL — ABNORMAL LOW (ref 13.0–17.0)
MCH: 31.9 pg (ref 26.0–34.0)
MCHC: 33.9 g/dL (ref 30.0–36.0)
MCV: 94.2 fL (ref 78.0–100.0)
Platelets: 329 10*3/uL (ref 150–400)
RBC: 3.29 MIL/uL — ABNORMAL LOW (ref 4.22–5.81)
RDW: 12.1 % (ref 11.5–15.5)
WBC: 9 10*3/uL (ref 4.0–10.5)

## 2013-04-28 LAB — BASIC METABOLIC PANEL
BUN: 9 mg/dL (ref 6–23)
CO2: 26 mEq/L (ref 19–32)
Calcium: 9.4 mg/dL (ref 8.4–10.5)
Chloride: 100 mEq/L (ref 96–112)
Creatinine, Ser: 0.85 mg/dL (ref 0.50–1.35)
Glucose, Bld: 93 mg/dL (ref 70–99)
POTASSIUM: 4.3 meq/L (ref 3.7–5.3)
SODIUM: 140 meq/L (ref 137–147)

## 2013-04-28 LAB — GLUCOSE, CAPILLARY: Glucose-Capillary: 89 mg/dL (ref 70–99)

## 2013-04-28 MED ORDER — IOHEXOL 300 MG/ML  SOLN: 25.0000 mL | INTRAMUSCULAR | Status: AC

## 2013-04-28 MED ORDER — IOHEXOL 300 MG/ML  SOLN
80.0000 mL | Freq: Once | INTRAMUSCULAR | Status: AC | PRN
  Administered 2013-04-28: 80 mL via INTRAVENOUS

## 2013-04-28 NOTE — Progress Notes (Signed)
[facility] PROGRESS NOTE  Ricky Villanueva OJJ:[id number] DOB: 1950-05-26 DOA: 04/21/2013 PCP: [name], MD  Assessment/Plan: Respiratory failure with hypoxia  -Multifactorial causes: pneumonia, questionable postobstructive pneumonia, PE  -CXR: progressive RLL airspace disease and soft tissue fullness in right hilar region  -CTA Chest: non-occlusive PE in LUL and RLL  -continue vancomycin and cefepime, duoneb treatments, supplemental oxygen to maintain saturations above 92%  -Blood cultures negative to date, strep pneumonia and influenza negative  -Patient remains clinically stable on 2 L nasal cannula  -Plan to check ambulatory pulse oximetry prior to discharge POORLY DIFFERENTIATED SMALL CELL NEUROENDOCRINE CARCINOMA. -CT Chest: The mediastinal and right hilar lymphadenopathy; extensive consolidation right lung suggestive of postobstructive pneumonia versus lymphangitic spread  -bronchoscopy with biopsy 04/26/2013 -Patient has extensive smoking history--quit 2013  -case discussed with Dr. Juliann Mule  -CT abdomen and pelvis negative for metastatic lesions -MRI brain -consulted radiation oncology Post-obstructive pneumonia  -Treatment and plan as stated above  -Finished 6 days vancomycin/cefepime  -Appreciate pulmonary recommendations--switched to oral levofloxacin x5 days  Pulmonary embolism  -Discontinue heparin drip, started rivaroxaban  -Lower Ext doppler negative for DVT  Hemoptysis  -likely due to cough and straining related to cough  -continue to monitor for other episodes of hemoptysis  GERD (gastroesophageal reflux disease)  -Continue protonix and Pepcid (home medications)  Hypertension  -Continue lisinopril  -controlled Code Status: Full  Family Communication: wife and daughter at bedside  Disposition Plan: Admitted   Procedures  Bronchoscopy with biopsy 04/26/13         Procedures/Studies: Dg Chest 2 View  04/21/2013   CLINICAL DATA:  Cough and  hemoptysis  EXAM: CHEST  2 VIEW  COMPARISON:  DG CHEST 2V dated 04/14/2013  FINDINGS: There has been an interval increase in the density of the confluent infiltrate in the right lower lobe consistent with progressive pneumonia. The right middle and upper lobes appear clear. The left lung appears clear. No significant pleural effusion is demonstrated. The cardiac silhouette is normal in size. The pulmonary vascularity is not engorged. The trachea is midline. There is soft tissue fullness in the right hilar region which may reflect lymphadenopathy. This is not new.  IMPRESSION: Progressive increased density in the right lower lobe is present and is consistent with worsening atelectasis or pneumonia. Soft tissue fullness in the hilar region is suspicious for lymphadenopathy or mass. Chest CT scanning is recommended.   Electronically Signed   By: Tyshauna Finkbiner  [place]   On: 04/21/2013 13:48   Dg Chest 2 View  04/18/2013   CLINICAL DATA:  Cough  EXAM: CHEST  2 VIEW  COMPARISON:  02/05/2004  FINDINGS: There is dense consolidation in the right lower lobe consistent with pneumonia. Small area of mild consolidation is noted at the base of the right middle lobe.  Lungs are hyperexpanded but otherwise clear. No pleural effusion or pneumothorax.  Cardiac silhouette is normal in size. There is fullness of the right hilum suggesting reactive adenopathy.  IMPRESSION: 1. Right lower lobe pneumonia with a small focus of right middle lobe infiltrate. Fullness of the right hilum suggests reactive right hilar adenopathy.   Electronically Signed   By: Lajean Manes M.D.   On: 04/18/2013 08:13   Ct Angio Chest W/cm &/or Wo Cm  04/21/2013   CLINICAL DATA:  Hemoptysis, shortness of breath, right chest pain.  EXAM: CT ANGIOGRAPHY CHEST WITH CONTRAST  TECHNIQUE: Multidetector CT imaging of the chest was performed using the standard protocol during bolus administration of intravenous contrast.  Multiplanar CT image reconstructions and MIPs were  obtained to evaluate the vascular anatomy.  CONTRAST:  131mL OMNIPAQUE IOHEXOL 350 MG/ML SOLN  COMPARISON:  DG CHEST 2 VIEW dated 04/21/2013; DG CHEST 2V dated 04/14/2013  FINDINGS: Main pulmonary artery is not enlarged. Nonocclusive segmental to subsegmental left upper lobe pulmonary embolus, bulky right hilar lymphadenopathy encases and narrows the right lower lobe pulmonary arteries, right middle lobe pulmonary vein. Nonocclusive pulmonary embolism right lower lobe segmental branches. Hilar lymphadenopathy encases the right middle lobe and lower lobe bronchi, incompletely and occludes the right lower lobe subsegmental bronchi. Patchy consolidation throughout the right lung, with interstitial prominence. More focal consolidation within the medial segment of the right lower lobe measuring 1.5 x 3.3 cm.  Mildly cystic 10 mm right peritracheal lymph nodes, a pretracheal 25 mm subcarinal 28 x 53 mm nodal conglomeration bulky right hilar lymphadenopathy. Severe centrilobular emphysema, with apical bullous changes. No pleural effusions. No left pulmonary nodule or mass.  The heart size is not enlarged, apparent right heart strain. Pericardium is nonsuspicious. Mild Coronary artery calcifications. Thoracic aorta is normal in course and caliber thickening vessels. Included view of the abdomen is nonsuspicious appearing bilateral acromioclavicular osteoarthrosis. .  Review of the MIP images confirms the above findings.  IMPRESSION: Nonocclusive left upper lobe, right lower lobe pulmonary emboli. Right heart strain.  Bulky mediastinal and right hilar lymphadenopathy, encasing the of pulmonary arteries, pulmonary veins and bronchi, completely effacing right lower lobe segmental bronchi. Extensive consolidation in the right lung suggests postobstructive pneumonia, with interstitial prominence which may reflect superimposed lymphangitic spread of infection or neoplasm. Constellation of findings are highly concerning for  neoplastic process, with consolidated focus in the right lung base, unclear whether this reflects lung primary. Additional consideration is lymphoma. Findings less likely reflect autoimmune disorders such as sarcoid.  Critical Value/emergent results were called by telephone at the time of interpretation on 04/21/2013 at 4:00 PM to [facility] PA, who verbally acknowledged these results.   Electronically Signed   By: Elon Alas   On: 04/21/2013 16:04   Ct Abdomen Pelvis W Contrast  04/28/2013   CLINICAL DATA:  New diagnosis of small cell lung cancer.  EXAM: CT ABDOMEN AND PELVIS WITH CONTRAST  TECHNIQUE: Multidetector CT imaging of the abdomen and pelvis was performed using the standard protocol following bolus administration of intravenous contrast.  CONTRAST:  73mL OMNIPAQUE IOHEXOL 300 MG/ML  SOLN  COMPARISON:  Chest CT 04/21/2013  FINDINGS: Dense right lower lobe airspace consolidation is again noted.  Three or 4 small low-attenuation liver lesions are too small to accurately characterize. These could be cysts but could not exclude metastasis. The gallbladder is normal. No common bowel duct dilatation. The spleen is normal in size. No focal lesions. The pancreas is normal. The adrenal glands and kidneys are unremarkable. Small renal cysts are noted.  The stomach, duodenum, small bowel and colon are unremarkable. No inflammatory changes, mass lesions or obstructive findings. No mesenteric or retroperitoneal mass or adenopathy. Small scattered lymph nodes are noted. Advanced atherosclerotic calcifications involving the aorta. The major branch vessels are patent.  The bladder, prostate gland and seminal vesicles are unremarkable. No pelvic mass, adenopathy or free pelvic fluid collections. No inguinal mass or adenopathy.  The bony structures are unremarkable. No destructive bone lesions or spinal canal compromise.  IMPRESSION: Small low-attenuation liver lesions are indeterminate but are most likely  small hepatic cysts.  No other significant findings in the and.  Age advanced atherosclerotic changes involving  the aorta and iliac vessels.   Electronically Signed   By: Kalman Jewels M.D.   On: 04/28/2013 14:25         Subjective: Patient complains of some dyspnea on exertion. Denies fevers, chills, chest discomfort or shortness breath, nausea, vomiting, diarrhea, abdominal pain, dysuria, hematuria. No headache or dizziness.  Objective: Filed Vitals:   04/28/13 0900 04/28/13 1000 04/28/13 1400 04/28/13 1727  BP:  126/79 124/76 136/73  Pulse:  78 66 92  Temp:  98.2 F (36.8 C) 98.6 F (37 C) 98.2 F (36.8 C)  TempSrc:  Oral Oral Oral  Resp:  18 18 20   Height:      Weight:      SpO2: 94% 96% 96% 98%    Intake/Output Summary (Last 24 hours) at 04/28/13 1813 Last data filed at 04/28/13 1300  Gross per 24 hour  Intake    480 ml  Output      0 ml  Net    480 ml   Weight change: -2.996 kg (-6 lb 9.7 oz) Exam:   General:  Pt is alert, follows commands appropriately, not in acute distress  HEENT: No icterus, No thrush, No neck mass, [place]/AT  Cardiovascular: RRR, S1/S2, no rubs, no gallops  Respiratory: CTA bilaterally, no wheezing, no crackles, no rhonchi  Abdomen: Soft/+BS, non tender, non distended, no guarding  Extremities: No edema, No lymphangitis, No petechiae, No rashes, no synovitis  Data Reviewed: Basic Metabolic Panel:  Recent Labs Lab 04/21/13 1820 04/22/13 0406 04/28/13 0505  NA 135* 137 140  K 4.2 3.9 4.3  CL 97 102 100  CO2 20 22 26   GLUCOSE 210* 216* 93  BUN 15 17 9   CREATININE 1.04 0.96 0.85  CALCIUM 8.5 8.7 9.4  MG 1.8  --   --   PHOS 2.3  --   --    Liver Function Tests:  Recent Labs Lab 04/21/13 1820 04/22/13 0406  AST 17 14  ALT 13 10  ALKPHOS 78 70  BILITOT 0.4 0.3  PROT 6.5 6.1  ALBUMIN 2.8* 2.6*   No results found for this basename: LIPASE, AMYLASE,  in the last 168 hours No results found for this basename: AMMONIA,   in the last 168 hours CBC:  Recent Labs Lab 04/21/13 1820  04/24/13 0629 04/25/13 0405 04/26/13 0440 04/27/13 0543 04/28/13 0505  WBC 12.3*  < > 10.4 9.0 10.5 13.2* 9.0  NEUTROABS 11.8*  --   --   --   --   --   --   HGB 12.2*  < > 11.0* 10.8* 11.7* 11.6* 10.5*  HCT 35.3*  < > 32.8* 31.7* 34.1* 33.8* 31.0*  MCV 93.9  < > 93.2 93.0 94.2 94.2 94.2  PLT 265  < > 284 260 294 334 329  < > = values in this interval not displayed. Cardiac Enzymes: No results found for this basename: CKTOTAL, CKMB, CKMBINDEX, TROPONINI,  in the last 168 hours BNP: No components found with this basename: POCBNP,  CBG:  Recent Labs Lab 04/22/13 0842 04/26/13 0745 04/27/13 0753 04/28/13 0747  GLUCAP 156* 91 94 89    Recent Results (from the past 240 hour(s))  CULTURE, BLOOD (ROUTINE X 2)     Status: None   Collection Time    04/21/13  2:40 PM      Result Value Ref Range Status   Specimen Description BLOOD ARM LEFT   Final   Special Requests BOTTLES DRAWN AEROBIC AND ANAEROBIC 5CC  Final   Culture  Setup Time     Final   Value: 04/21/2013 20:43     Performed at [facility]   Culture     Final   Value: NO GROWTH 5 DAYS     Performed at [facility]   Report Status 04/27/2013 FINAL   Final  CULTURE, BLOOD (ROUTINE X 2)     Status: None   Collection Time    04/21/13  2:50 PM      Result Value Ref Range Status   Specimen Description BLOOD HAND LEFT   Final   Special Requests BOTTLES DRAWN AEROBIC AND ANAEROBIC 5CC   Final   Culture  Setup Time     Final   Value: 04/21/2013 20:42     Performed at [facility]   Culture     Final   Value: NO GROWTH 5 DAYS     Performed at [facility]   Report Status 04/27/2013 FINAL   Final  SURGICAL PCR SCREEN     Status: None   Collection Time    04/26/13  6:02 AM      Result Value Ref Range Status   MRSA, PCR NEGATIVE  NEGATIVE Final   Staphylococcus aureus NEGATIVE  NEGATIVE Final   Comment:            The Xpert  SA Assay (FDA     approved for NASAL specimens     in patients over 38 years of age),     is one component of     a comprehensive surveillance     program.  Test performance has     been validated by [employer] for patients greater     than or equal to 32 year old.     It is not intended     to diagnose infection nor to     guide or monitor treatment.     Scheduled Meds: . cholecalciferol  2,000 Units Oral Daily  . famotidine  20 mg Oral BID  . ipratropium-albuterol  3 mL Nebulization BID  . levofloxacin  750 mg Oral Daily  . lisinopril  10 mg Oral Daily  . pantoprazole  40 mg Oral Daily  . Rivaroxaban  15 mg Oral BID WC   Continuous Infusions:    Shaira Sova, DO  [facility] Pager [phone]  If 7PM-7AM, please contact night-coverage [email or web address] Password [email or web address] 04/28/2013, 6:13 PM   LOS: 7 days

## 2013-04-29 ENCOUNTER — Inpatient Hospital Stay (HOSPITAL_COMMUNITY): Payer: BC Managed Care – PPO

## 2013-04-29 ENCOUNTER — Telehealth: Payer: Self-pay | Admitting: Radiation Oncology

## 2013-04-29 ENCOUNTER — Telehealth: Payer: Self-pay | Admitting: Internal Medicine

## 2013-04-29 ENCOUNTER — Other Ambulatory Visit: Payer: Self-pay | Admitting: Internal Medicine

## 2013-04-29 DIAGNOSIS — C3490 Malignant neoplasm of unspecified part of unspecified bronchus or lung: Secondary | ICD-10-CM | POA: Insufficient documentation

## 2013-04-29 LAB — CBC
HCT: 32.8 % — ABNORMAL LOW (ref 39.0–52.0)
Hemoglobin: 11.1 g/dL — ABNORMAL LOW (ref 13.0–17.0)
MCH: 31.7 pg (ref 26.0–34.0)
MCHC: 33.8 g/dL (ref 30.0–36.0)
MCV: 93.7 fL (ref 78.0–100.0)
PLATELETS: 368 10*3/uL (ref 150–400)
RBC: 3.5 MIL/uL — ABNORMAL LOW (ref 4.22–5.81)
RDW: 12.1 % (ref 11.5–15.5)
WBC: 10.8 10*3/uL — AB (ref 4.0–10.5)

## 2013-04-29 LAB — GLUCOSE, CAPILLARY: GLUCOSE-CAPILLARY: 94 mg/dL (ref 70–99)

## 2013-04-29 MED ORDER — ALBUTEROL SULFATE HFA 108 (90 BASE) MCG/ACT IN AERS: 2.0000 | INHALATION_SPRAY | Freq: Four times a day (QID) | RESPIRATORY_TRACT | Status: DC | PRN

## 2013-04-29 MED ORDER — TIOTROPIUM BROMIDE MONOHYDRATE 18 MCG IN CAPS: 18.0000 ug | ORAL_CAPSULE | Freq: Every day | RESPIRATORY_TRACT | Status: DC

## 2013-04-29 MED ORDER — LEVOFLOXACIN 750 MG PO TABS: 750.0000 mg | ORAL_TABLET | Freq: Every day | ORAL | Status: DC

## 2013-04-29 MED ORDER — RIVAROXABAN 20 MG PO TABS: 20.0000 mg | ORAL_TABLET | Freq: Every day | ORAL | Status: DC

## 2013-04-29 MED ORDER — GADOBENATE DIMEGLUMINE 529 MG/ML IV SOLN
15.0000 mL | Freq: Once | INTRAVENOUS | Status: AC | PRN
  Administered 2013-04-29: 13 mL via INTRAVENOUS

## 2013-04-29 MED ORDER — RIVAROXABAN 15 MG PO TABS: 15.0000 mg | ORAL_TABLET | Freq: Two times a day (BID) | ORAL | Status: DC

## 2013-04-29 NOTE — Discharge Summary (Addendum)
Physician Discharge Summary  Ricky Villanueva UJW:[id number] DOB: 04-17-1950 DOA: 04/21/2013  PCP: [name], MD  Admit date: 04/21/2013 Discharge date: 04/29/2013  Recommendations for Outpatient Follow-up:  1. Pt will need to follow up with PCP in 2 weeks post discharge 2. Please obtain BMP to evaluate electrolytes and kidney function 3. Please also check CBC to evaluate Hg and Hct levels   Discharge Diagnoses:  Acute Respiratory failure with hypoxia  -Multifactorial causes: pneumonia, questionable postobstructive pneumonia, PE  -CXR: progressive RLL airspace disease and soft tissue fullness in right hilar region  -CTA Chest: non-occlusive PE in LUL and RLL  -continue vancomycin and cefepime, duoneb treatments, supplemental oxygen to maintain saturations above 92%  -Blood cultures negative to date, strep pneumonia and influenza negative  -Patient remains clinically stable on 2 L nasal cannula  -ambulatory pulse oximetry on RA revealed oxygen desaturation to 82%--> patient was up with home oxygen therapy. POORLY DIFFERENTIATED SMALL CELL NEUROENDOCRINE CARCINOMA.  -CT Chest: The mediastinal and right hilar lymphadenopathy; extensive consolidation right lung suggestive of postobstructive pneumonia versus lymphangitic spread  -bronchoscopy with biopsy 04/26/2013  -Patient has extensive smoking history--quit 2013  -case discussed with Dr. Marianne Sofia requested CT of the abdomen and pelvis and MRI of the brain for clinical staging. These diagnostic tests were ordered.  -CT abdomen and pelvis negative for metastatic lesions  -MRI brain--1.5 cm ring-enhancing lesion in the mid brain with 1-2 punctate lesions in the right parietal lobe that were concerning for metastatic disease. -Case discussed with Dr. Pablo Ledger who will set pt up with radiotherapy simulation next week Post-obstructive pneumonia  -Treatment and plan as stated above  -Finished 6 days vancomycin/cefepime  -Appreciate pulmonary  recommendations--switched to oral levofloxacin x5 days - discharged home with 3 additional days of levofloxacin to complete his therapy.  Pulmonary embolism  -Discontinue heparin drip, started rivaroxaban  -Lower Ext doppler negative for DVT - Patient will continue with rivaroxaban 50 mg twice a day for 10 additional days, then he will switch to rivaroxaban 20 mg once daily  Hemoptysis  -likely due to cough and straining related to cough  -continue to monitor for other episodes of hemoptysis  -Patient's hemoglobin remained stable. He remained hemodynamically stable. GERD (gastroesophageal reflux disease)  -Continue protonix and Pepcid (home medications)  Hypertension  -Continue lisinopril  -controlled  Code Status: Full   Disposition Plan: Admitted  Procedures  Bronchoscopy with biopsy 04/26/13   Discharge Condition: Stable  Disposition:  discharge home  Diet: regular Wt Readings from Last 3 Encounters:  04/29/13 58.3 kg (128 lb 8.5 oz)  04/29/13 58.3 kg (128 lb 8.5 oz)  04/18/13 59.693 kg (131 lb 9.6 oz)    History of present illness:  63 y.o. male with a past medical history of HTN/Hyperlipidemia and longstanding smoking history but quit 12/25/2011 presented to ER on 02/26 with worsening dyspnea, cough with streaks of blood over the past one week. In the ED, his oxygen saturation was 91% on 2 L nasal canula. CXR revealed a progressive increased density in the right lower lobe consistent with worsening atelectasis or pneumonia and a soft tissue fullness in the hilar region suspicious for lymphadenopathy or mass.and follow-up CT of chest was recommended. It revealed a nonocclusive left upper lobe, right lower lobe pulmonary emboli with right heart strain. Bulky mediastinal and right hilar lymphadenopathy, encasing the of pulmonary arteries, pulmonary veins and bronchi, completely effacing right lower lobe segmental bronchi. Extensive consolidation in the right lung suggests  postobstructive pneumonia, with interstitial  prominence which may reflect superimposed lymphangitic spread of infection or neoplasm. CTA of chest was positive for PE.  The patient was started on heparin drip. This was discontinued temporarily as the patient underwent bronchoscopy. He was admitted and started on antibiotics and heparin. Pulmonary was consulted and he had a video bronchoscopy on 03/03 with preliminary findings consistent with small cell ca of lung. Bronchoscopy also showed diffuse bronchitic changes with obstruction of the anterior basilar segment of the right mid lobe. Patient reports smoking from age 68 until 2013 with a 2 pack per day history over the past 20 years. Today, he is accompanied by his wife [name] and daughter [name]. He lives about 13 miles from [place], [place]. This is his first hospital admission ever.  He reports that his weight is stable. 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 He states that it requiring frequently up steps daily and over the past one week he has been unable due to the dyspnea.  After the bronchoscopy, the patient was transitioned to oral rivaroxaban. He continued to have a scant amount of hemoptysis. His hemoglobin remained stable. He was hemodynamically stable. He tolerated the medication without any further difficulty. Medical oncology was consulted. They recommended further staging workup including CT of the abdomen and pelvis and MRI of the brain. CT of the abdomen and pelvis was negative for any metastasis. Results of MRI of the brain is still pending at the time of discharge. The case was discussed with Dr. Concha Norway and the patient will followup in the outpatient setting. Radiation oncology was consulted. The case was discussed with Dr. Pablo Ledger. She was set up the patient for an outpatient appointment with radiotherapy simulation in the early part of next week  05/02/2013.      Consultants: Med Onc--[facility]--[facility]--[facility]  Discharge Exam: Filed Vitals:   04/29/13 0451  BP: 137/76  Pulse: 90  Temp: 98.4 F (36.9 C)  Resp: 16   Filed Vitals:   04/28/13 1727 04/28/13 2035 04/29/13 0451 04/29/13 0500  BP: 136/73 124/71 137/76   Pulse: 92 95 90   Temp: 98.2 F (36.8 C) 99.5 F (37.5 C) 98.4 F (36.9 C)   TempSrc: Oral Oral Oral   Resp: 20 20 16    Height:      Weight:  56.5 kg (124 lb 9 oz)  58.3 kg (128 lb 8.5 oz)  SpO2: 98% 91% 90%    General: A&O x 3, NAD, pleasant, cooperative Cardiovascular: RRR, no rub, no gallop, no S3 Respiratory: bilateral scattered rales. No wheezing good air movement.  Abdomen:soft, nontender, nondistended, positive bowel sounds Extremities: No edema, No lymphangitis, no petechiae  Discharge Instructions      Discharge Orders   Future Appointments Provider Department Dept Phone   08/02/2013 4:00 PM Vernie Shanks, MD [facility] [phone]   Future Orders Complete By Expires   Diet - low sodium heart healthy  As directed    Increase activity slowly  As directed        Medication List    STOP taking these medications       cefdinir 300 MG capsule  Commonly known as:  OMNICEF     clarithromycin 500 MG tablet  Commonly known as:  BIAXIN     guaiFENesin-codeine 100-10 MG/5ML syrup  Commonly known as:  CHERATUSSIN AC      TAKE these medications  albuterol 108 (90 BASE) MCG/ACT inhaler  Commonly known as:  PROVENTIL HFA;VENTOLIN HFA  Inhale 2 puffs into the lungs every 6 (six) hours as needed for wheezing or shortness of breath.     KRILL OIL PO  Take 1 tablet by mouth daily.     levofloxacin 750 MG tablet  Commonly known as:  LEVAQUIN  Take 1 tablet (750 mg total) by mouth daily.     lisinopril 10 MG tablet  Commonly known as:  PRINIVIL,ZESTRIL  Take 10 mg by mouth daily.     pantoprazole 40 MG tablet  Commonly known as:   PROTONIX  Take 1 tablet (40 mg total) by mouth daily.     ranitidine 300 MG capsule  Commonly known as:  ZANTAC  Take 1 capsule (300 mg total) by mouth daily.     Rivaroxaban 15 MG Tabs tablet  Commonly known as:  XARELTO  Take 1 tablet (15 mg total) by mouth 2 (two) times daily with a meal. X 19 days     Rivaroxaban 20 MG Tabs tablet  Commonly known as:  XARELTO  Take 1 tablet (20 mg total) by mouth daily with supper. Start AFTER 15mg  dose is finished (05/19/13)     tiotropium 18 MCG inhalation capsule  Commonly known as:  SPIRIVA HANDIHALER  Place 1 capsule (18 mcg total) into inhaler and inhale daily.     Vitamin D 2000 UNITS Caps  Take 2,000 Units by mouth daily.         The results of significant diagnostics from this hospitalization (including imaging, microbiology, ancillary and laboratory) are listed below for reference.    Significant Diagnostic Studies: Dg Chest 2 View  04/21/2013   CLINICAL DATA:  Cough and hemoptysis  EXAM: CHEST  2 VIEW  COMPARISON:  DG CHEST 2V dated 04/14/2013  FINDINGS: There has been an interval increase in the density of the confluent infiltrate in the right lower lobe consistent with progressive pneumonia. The right middle and upper lobes appear clear. The left lung appears clear. No significant pleural effusion is demonstrated. The cardiac silhouette is normal in size. The pulmonary vascularity is not engorged. The trachea is midline. There is soft tissue fullness in the right hilar region which may reflect lymphadenopathy. This is not new.  IMPRESSION: Progressive increased density in the right lower lobe is present and is consistent with worsening atelectasis or pneumonia. Soft tissue fullness in the hilar region is suspicious for lymphadenopathy or mass. Chest CT scanning is recommended.   Electronically Signed   By: Pernell Lenoir  [place]   On: 04/21/2013 13:48   Dg Chest 2 View  04/18/2013   CLINICAL DATA:  Cough  EXAM: CHEST  2 VIEW  COMPARISON:   02/05/2004  FINDINGS: There is dense consolidation in the right lower lobe consistent with pneumonia. Small area of mild consolidation is noted at the base of the right middle lobe.  Lungs are hyperexpanded but otherwise clear. No pleural effusion or pneumothorax.  Cardiac silhouette is normal in size. There is fullness of the right hilum suggesting reactive adenopathy.  IMPRESSION: 1. Right lower lobe pneumonia with a small focus of right middle lobe infiltrate. Fullness of the right hilum suggests reactive right hilar adenopathy.   Electronically Signed   By: Lajean Manes M.D.   On: 04/18/2013 08:13   Ct Angio Chest W/cm &/or Wo Cm  04/21/2013   CLINICAL DATA:  Hemoptysis, shortness of breath, right chest pain.  EXAM: CT ANGIOGRAPHY CHEST WITH CONTRAST  TECHNIQUE: Multidetector CT imaging of the chest was performed using the standard protocol during bolus administration of intravenous contrast. Multiplanar CT image reconstructions and MIPs were obtained to evaluate the vascular anatomy.  CONTRAST:  129mL OMNIPAQUE IOHEXOL 350 MG/ML SOLN  COMPARISON:  DG CHEST 2 VIEW dated 04/21/2013; DG CHEST 2V dated 04/14/2013  FINDINGS: Main pulmonary artery is not enlarged. Nonocclusive segmental to subsegmental left upper lobe pulmonary embolus, bulky right hilar lymphadenopathy encases and narrows the right lower lobe pulmonary arteries, right middle lobe pulmonary vein. Nonocclusive pulmonary embolism right lower lobe segmental branches. Hilar lymphadenopathy encases the right middle lobe and lower lobe bronchi, incompletely and occludes the right lower lobe subsegmental bronchi. Patchy consolidation throughout the right lung, with interstitial prominence. More focal consolidation within the medial segment of the right lower lobe measuring 1.5 x 3.3 cm.  Mildly cystic 10 mm right peritracheal lymph nodes, a pretracheal 25 mm subcarinal 28 x 53 mm nodal conglomeration bulky right hilar lymphadenopathy. Severe centrilobular  emphysema, with apical bullous changes. No pleural effusions. No left pulmonary nodule or mass.  The heart size is not enlarged, apparent right heart strain. Pericardium is nonsuspicious. Mild Coronary artery calcifications. Thoracic aorta is normal in course and caliber thickening vessels. Included view of the abdomen is nonsuspicious appearing bilateral acromioclavicular osteoarthrosis. .  Review of the MIP images confirms the above findings.  IMPRESSION: Nonocclusive left upper lobe, right lower lobe pulmonary emboli. Right heart strain.  Bulky mediastinal and right hilar lymphadenopathy, encasing the of pulmonary arteries, pulmonary veins and bronchi, completely effacing right lower lobe segmental bronchi. Extensive consolidation in the right lung suggests postobstructive pneumonia, with interstitial prominence which may reflect superimposed lymphangitic spread of infection or neoplasm. Constellation of findings are highly concerning for neoplastic process, with consolidated focus in the right lung base, unclear whether this reflects lung primary. Additional consideration is lymphoma. Findings less likely reflect autoimmune disorders such as sarcoid.  Critical Value/emergent results were called by telephone at the time of interpretation on 04/21/2013 at 4:00 PM to [facility], who verbally acknowledged these results.   Electronically Signed   By: Elon Alas   On: 04/21/2013 16:04   Ct Abdomen Pelvis W Contrast  04/28/2013   CLINICAL DATA:  New diagnosis of small cell lung cancer.  EXAM: CT ABDOMEN AND PELVIS WITH CONTRAST  TECHNIQUE: Multidetector CT imaging of the abdomen and pelvis was performed using the standard protocol following bolus administration of intravenous contrast.  CONTRAST:  66mL OMNIPAQUE IOHEXOL 300 MG/ML  SOLN  COMPARISON:  Chest CT 04/21/2013  FINDINGS: Dense right lower lobe airspace consolidation is again noted.  Three or 4 small low-attenuation liver lesions are too  small to accurately characterize. These could be cysts but could not exclude metastasis. The gallbladder is normal. No common bowel duct dilatation. The spleen is normal in size. No focal lesions. The pancreas is normal. The adrenal glands and kidneys are unremarkable. Small renal cysts are noted.  The stomach, duodenum, small bowel and colon are unremarkable. No inflammatory changes, mass lesions or obstructive findings. No mesenteric or retroperitoneal mass or adenopathy. Small scattered lymph nodes are noted. Advanced atherosclerotic calcifications involving the aorta. The major branch vessels are patent.  The bladder, prostate gland and seminal vesicles are unremarkable. No pelvic mass, adenopathy or free pelvic fluid collections. No inguinal mass or adenopathy.  The bony structures are unremarkable. No destructive bone lesions or spinal canal compromise.  IMPRESSION: Small low-attenuation liver lesions are indeterminate but are most  likely small hepatic cysts.  No other significant findings in the and.  Age advanced atherosclerotic changes involving the aorta and iliac vessels.   Electronically Signed   By: Kalman Jewels M.D.   On: 04/28/2013 14:25     Microbiology: Recent Results (from the past 240 hour(s))  CULTURE, BLOOD (ROUTINE X 2)     Status: None   Collection Time    04/21/13  2:40 PM      Result Value Ref Range Status   Specimen Description BLOOD ARM LEFT   Final   Special Requests BOTTLES DRAWN AEROBIC AND ANAEROBIC 5CC   Final   Culture  Setup Time     Final   Value: 04/21/2013 20:43     Performed at [facility]   Culture     Final   Value: NO GROWTH 5 DAYS     Performed at [facility]   Report Status 04/27/2013 FINAL   Final  CULTURE, BLOOD (ROUTINE X 2)     Status: None   Collection Time    04/21/13  2:50 PM      Result Value Ref Range Status   Specimen Description BLOOD HAND LEFT   Final   Special Requests BOTTLES DRAWN AEROBIC AND ANAEROBIC 5CC   Final    Culture  Setup Time     Final   Value: 04/21/2013 20:42     Performed at [facility]   Culture     Final   Value: NO GROWTH 5 DAYS     Performed at [facility]   Report Status 04/27/2013 FINAL   Final  SURGICAL PCR SCREEN     Status: None   Collection Time    04/26/13  6:02 AM      Result Value Ref Range Status   MRSA, PCR NEGATIVE  NEGATIVE Final   Staphylococcus aureus NEGATIVE  NEGATIVE Final   Comment:            The Xpert SA Assay (FDA     approved for NASAL specimens     in patients over 41 years of age),     is one component of     a comprehensive surveillance     program.  Test performance has     been validated by [employer] for patients greater     than or equal to 84 year old.     It is not intended     to diagnose infection nor to     guide or monitor treatment.     Labs: Basic Metabolic Panel:  Recent Labs Lab 04/28/13 0505  NA 140  K 4.3  CL 100  CO2 26  GLUCOSE 93  BUN 9  CREATININE 0.85  CALCIUM 9.4   Liver Function Tests: No results found for this basename: AST, ALT, ALKPHOS, BILITOT, PROT, ALBUMIN,  in the last 168 hours No results found for this basename: LIPASE, AMYLASE,  in the last 168 hours No results found for this basename: AMMONIA,  in the last 168 hours CBC:  Recent Labs Lab 04/25/13 0405 04/26/13 0440 04/27/13 0543 04/28/13 0505 04/29/13 0503  WBC 9.0 10.5 13.2* 9.0 10.8*  HGB 10.8* 11.7* 11.6* 10.5* 11.1*  HCT 31.7* 34.1* 33.8* 31.0* 32.8*  MCV 93.0 94.2 94.2 94.2 93.7  PLT 260 294 334 329 368   Cardiac Enzymes: No results found for this basename: CKTOTAL, CKMB, CKMBINDEX, TROPONINI,  in the last 168 hours BNP:  No components found with this basename: POCBNP,  CBG:  Recent Labs Lab 04/22/13 0842 04/26/13 0745 04/27/13 0753 04/28/13 0747 04/29/13 0455  GLUCAP 156* 91 94 89 94    Time coordinating discharge:  Greater than 30 minutes  Signed:  Les Longmore, DO [facility] Pager:  [phone] 04/29/2013, 7:54 AM

## 2013-04-29 NOTE — Telephone Encounter (Signed)
C/D 04/29/13 for appt.05/02/13

## 2013-04-29 NOTE — Progress Notes (Addendum)
Radiation Oncology         [phone]) [phone] ________________________________  Initial outpatient Consultation - Date: 04/21/2013   Name: Ricky Villanueva MRN: [id number]   DOB: [birth year]  REFERRING PHYSICIAN: No ref. provider found  DIAGNOSIS:  1. HCAP (healthcare-associated pneumonia)   2. GERD (gastroesophageal reflux disease)   3. HTN (hypertension)   4. Respiratory failure with hypoxia   5. Acute pulmonary embolism   6. Lung mass   7. Emphysema lung   8. Small cell carcinoma of right lung     STAGE: Limited Stage Small Cell Lung Cancer (MRI brain pending)  HISTORY OF PRESENT ILLNESS::Ricky Villanueva is a 63 y.o. male  who presented to the emergency room in February with worsening shortness of breath and cough with streaks of blood. He had been treated as an outpatient for 2 weeks for pneumonia. When this did not improve and shortness of breath worsened he presented to the ER. A chest x-ray revealed increased density in the right lower lobe chest revealed a left upper lobe pulmonary embolus, right hilar adenopathy a right lower lobe pulmonary embolus and consolidation through the right lung. The hilar adenopathy in case the middle and lower lobe bronchi. Subcarinal lymph node measuring 2.8 x 5.3 cm also was noted. No pleural effusions. He underwent bronchoscopy on 04/26/2013 and pathology was consistent with small cell carcinoma. An MRI of the brain has been ordered but has not been performed. A CT of the abdomen and pelvis was negative for metastatic disease to the liver. Denies any fevers chills or weight loss. He must L. sleep sitting up in a chair do to his significant orthopnea. He is a former smoker but quit 2 years ago. Nebulizer treatments temporarily relieve his shortness of breath. He continues to have a scant hemoptysis. I was asked to see him for my opinion regarding treatment of his newly diagnosed small cell carcinoma.[name]  PREVIOUS RADIATION THERAPY: No  PAST MEDICAL HISTORY:  has  a past medical history of Hyperlipidemia; Hypertension; GERD (gastroesophageal reflux disease); Respiratory failure with hypoxia (04/21/2013); Pulmonary embolism; COPD (chronic obstructive pulmonary disease); Pneumonia (2/?01/2014); and Arthritis.    PAST SURGICAL HISTORY: Past Surgical History  Procedure Laterality Date  . Inguinal hernia repair Right ~ 2005  . Finger surgery Left ~ 1989    "crushed end of my middle finger off"   . Video bronchoscopy Bilateral 04/26/2013    Procedure: VIDEO BRONCHOSCOPY WITH FLUORO;  Surgeon: Wilhelmina Mcardle, MD;  Location: [facility] ENDOSCOPY;  Service: Cardiopulmonary;  Laterality: Bilateral;    FAMILY HISTORY: History reviewed. No pertinent family history.  SOCIAL HISTORY:  History  Substance Use Topics  . Smoking status: Former Smoker -- 2.00 packs/day for 47 years    Types: Cigarettes    Quit date: 12/25/2011  . Smokeless tobacco: Never Used  . Alcohol Use: Yes     Comment: 04/21/2013 "used to drink a little bit; very little; nothing in years"    ALLERGIES: Augmentin  MEDICATIONS:  Current Facility-Administered Medications  Medication Dose Route Frequency Provider Last Rate Last Dose  . acetaminophen (TYLENOL) tablet 650 mg  650 mg Oral Q6H PRN Robbie Lis, MD       Or  . acetaminophen (TYLENOL) suppository 650 mg  650 mg Rectal Q6H PRN Robbie Lis, MD      . cholecalciferol (VITAMIN D) tablet 2,000 Units  2,000 Units Oral Daily Robbie Lis, MD   2,000 Units at 04/28/13 1042  . famotidine (PEPCID)  tablet 20 mg  20 mg Oral BID Robbie Lis, MD   20 mg at 04/28/13 2152  . guaiFENesin-codeine (ROBITUSSIN AC) 100-10 MG/5ML syrup 5 mL  5 mL Oral TID PRN Robbie Lis, MD      . HYDROcodone-acetaminophen (NORCO/VICODIN) 5-325 MG per tablet 1-2 tablet  1-2 tablet Oral Q4H PRN Robbie Lis, MD      . ipratropium-albuterol (DUONEB) 0.5-2.5 (3) MG/3ML nebulizer solution 3 mL  3 mL Nebulization BID Maryann Mikhail, DO   3 mL at 04/28/13 1945  .  levofloxacin (LEVAQUIN) tablet 750 mg  750 mg Oral Daily Wilhelmina Mcardle, MD   750 mg at 04/28/13 1532  . lisinopril (PRINIVIL,ZESTRIL) tablet 10 mg  10 mg Oral Daily Robbie Lis, MD   10 mg at 04/28/13 1042  . ondansetron (ZOFRAN) tablet 4 mg  4 mg Oral Q6H PRN Robbie Lis, MD       Or  . ondansetron [facility]) injection 4 mg  4 mg Intravenous Q6H PRN Robbie Lis, MD   4 mg at 04/27/13 0046  . pantoprazole (PROTONIX) EC tablet 40 mg  40 mg Oral Daily Robbie Lis, MD   40 mg at 04/28/13 1042  . Rivaroxaban (XARELTO) tablet 15 mg  15 mg Oral BID WC Orson Eva, MD   15 mg at 04/28/13 1827    REVIEW OF SYSTEMS:  A 15 point review of systems is documented in the electronic medical record. This was obtained by the nursing staff. However, I reviewed this with the patient to discuss relevant findings and make appropriate changes.  Pertinent items are noted in HPI.  PHYSICAL EXAM:  Filed Vitals:   04/29/13 0451  BP: 137/76  Pulse: 90  Temp: 98.4 F (36.9 C)  Resp: 16  .124 lb 9 oz (56.5 kg). He'll appearing male in moderate respiratory distress sitting in the bed. He has shortness of breath with even small motions. Alert and a x3. 5 out of 5 strength bilaterally. Decreased lung sounds on the right.  LABORATORY DATA:  Lab Results  Component Value Date   WBC 9.0 04/28/2013   HGB 10.5* 04/28/2013   HCT 31.0* 04/28/2013   MCV 94.2 04/28/2013   PLT 329 04/28/2013   Lab Results  Component Value Date   NA 140 04/28/2013   K 4.3 04/28/2013   CL 100 04/28/2013   CO2 26 04/28/2013   Lab Results  Component Value Date   ALT 10 04/22/2013   AST 14 04/22/2013   ALKPHOS 70 04/22/2013   BILITOT 0.3 04/22/2013     RADIOGRAPHY: Dg Chest 2 View  04/21/2013   CLINICAL DATA:  Cough and hemoptysis  EXAM: CHEST  2 VIEW  COMPARISON:  DG CHEST 2V dated 04/14/2013  FINDINGS: There has been an interval increase in the density of the confluent infiltrate in the right lower lobe consistent with progressive pneumonia. The  right middle and upper lobes appear clear. The left lung appears clear. No significant pleural effusion is demonstrated. The cardiac silhouette is normal in size. The pulmonary vascularity is not engorged. The trachea is midline. There is soft tissue fullness in the right hilar region which may reflect lymphadenopathy. This is not new.  IMPRESSION: Progressive increased density in the right lower lobe is present and is consistent with worsening atelectasis or pneumonia. Soft tissue fullness in the hilar region is suspicious for lymphadenopathy or mass. Chest CT scanning is recommended.   Electronically Signed   By:  David  Martinique   On: 04/21/2013 13:48   Dg Chest 2 View  04/18/2013   CLINICAL DATA:  Cough  EXAM: CHEST  2 VIEW  COMPARISON:  02/05/2004  FINDINGS: There is dense consolidation in the right lower lobe consistent with pneumonia. Small area of mild consolidation is noted at the base of the right middle lobe.  Lungs are hyperexpanded but otherwise clear. No pleural effusion or pneumothorax.  Cardiac silhouette is normal in size. There is fullness of the right hilum suggesting reactive adenopathy.  IMPRESSION: 1. Right lower lobe pneumonia with a small focus of right middle lobe infiltrate. Fullness of the right hilum suggests reactive right hilar adenopathy.   Electronically Signed   By: Lajean Manes M.D.   On: 04/18/2013 08:13   Ct Angio Chest W/cm &/or Wo Cm  04/21/2013   CLINICAL DATA:  Hemoptysis, shortness of breath, right chest pain.  EXAM: CT ANGIOGRAPHY CHEST WITH CONTRAST  TECHNIQUE: Multidetector CT imaging of the chest was performed using the standard protocol during bolus administration of intravenous contrast. Multiplanar CT image reconstructions and MIPs were obtained to evaluate the vascular anatomy.  CONTRAST:  150mL OMNIPAQUE IOHEXOL 350 MG/ML SOLN  COMPARISON:  DG CHEST 2 VIEW dated 04/21/2013; DG CHEST 2V dated 04/14/2013  FINDINGS: Main pulmonary artery is not enlarged. Nonocclusive  segmental to subsegmental left upper lobe pulmonary embolus, bulky right hilar lymphadenopathy encases and narrows the right lower lobe pulmonary arteries, right middle lobe pulmonary vein. Nonocclusive pulmonary embolism right lower lobe segmental branches. Hilar lymphadenopathy encases the right middle lobe and lower lobe bronchi, incompletely and occludes the right lower lobe subsegmental bronchi. Patchy consolidation throughout the right lung, with interstitial prominence. More focal consolidation within the medial segment of the right lower lobe measuring 1.5 x 3.3 cm.  Mildly cystic 10 mm right peritracheal lymph nodes, a pretracheal 25 mm subcarinal 28 x 53 mm nodal conglomeration bulky right hilar lymphadenopathy. Severe centrilobular emphysema, with apical bullous changes. No pleural effusions. No left pulmonary nodule or mass.  The heart size is not enlarged, apparent right heart strain. Pericardium is nonsuspicious. Mild Coronary artery calcifications. Thoracic aorta is normal in course and caliber thickening vessels. Included view of the abdomen is nonsuspicious appearing bilateral acromioclavicular osteoarthrosis. .  Review of the MIP images confirms the above findings.  IMPRESSION: Nonocclusive left upper lobe, right lower lobe pulmonary emboli. Right heart strain.  Bulky mediastinal and right hilar lymphadenopathy, encasing the of pulmonary arteries, pulmonary veins and bronchi, completely effacing right lower lobe segmental bronchi. Extensive consolidation in the right lung suggests postobstructive pneumonia, with interstitial prominence which may reflect superimposed lymphangitic spread of infection or neoplasm. Constellation of findings are highly concerning for neoplastic process, with consolidated focus in the right lung base, unclear whether this reflects lung primary. Additional consideration is lymphoma. Findings less likely reflect autoimmune disorders such as sarcoid.  Critical  Value/emergent results were called by telephone at the time of interpretation on 04/21/2013 at 4:00 PM to [facility] PA, who verbally acknowledged these results.   Electronically Signed   By: Elon Alas   On: 04/21/2013 16:04   Ct Abdomen Pelvis W Contrast  04/28/2013   CLINICAL DATA:  New diagnosis of small cell lung cancer.  EXAM: CT ABDOMEN AND PELVIS WITH CONTRAST  TECHNIQUE: Multidetector CT imaging of the abdomen and pelvis was performed using the standard protocol following bolus administration of intravenous contrast.  CONTRAST:  27mL OMNIPAQUE IOHEXOL 300 MG/ML  SOLN  COMPARISON:  Chest CT 04/21/2013  FINDINGS: Dense right lower lobe airspace consolidation is again noted.  Three or 4 small low-attenuation liver lesions are too small to accurately characterize. These could be cysts but could not exclude metastasis. The gallbladder is normal. No common bowel duct dilatation. The spleen is normal in size. No focal lesions. The pancreas is normal. The adrenal glands and kidneys are unremarkable. Small renal cysts are noted.  The stomach, duodenum, small bowel and colon are unremarkable. No inflammatory changes, mass lesions or obstructive findings. No mesenteric or retroperitoneal mass or adenopathy. Small scattered lymph nodes are noted. Advanced atherosclerotic calcifications involving the aorta. The major branch vessels are patent.  The bladder, prostate gland and seminal vesicles are unremarkable. No pelvic mass, adenopathy or free pelvic fluid collections. No inguinal mass or adenopathy.  The bony structures are unremarkable. No destructive bone lesions or spinal canal compromise.  IMPRESSION: Small low-attenuation liver lesions are indeterminate but are most likely small hepatic cysts.  No other significant findings in the and.  Age advanced atherosclerotic changes involving the aorta and iliac vessels.   Electronically Signed   By: Kalman Jewels M.D.   On: 04/28/2013 14:25        IMPRESSION: Limited stage small cell lung cancer with MRI of the brain pending  PLAN: I spoke to Mr. Cuevas today regarding his diagnosis and options for treatment. We discussed that small cell carcinoma is treated with a curative intent with concurrent chemoradiation for his brain MRI is negative. We also discussed an outpatient PET scan.. We discussed his prognosis. We discussed the process of simulation the placement tattoos. We discussed 6 weeks of treatment as an outpatient. Discussed esophagitis and damage to critical normal structures within the chest as possible side effects. I will set him up to see me the first part of the week to get his simulation going. I will coordinate his care with medical oncology.   I spent 40 minutes  face to face with the patient and more than 50% of that time was spent in counseling and/or coordination of care.   ------------------------------------------------  [name], MD

## 2013-04-29 NOTE — Care Management Note (Signed)
   CARE MANAGEMENT NOTE 04/29/2013  Patient:  Ricky Villanueva, Ricky Villanueva   Account Number:  [id number]  Date Initiated:  04/29/2013  Documentation initiated by:  Peachie Barkalow  Subjective/Objective Assessment:   Pt will start on Xarelto and will need home oxygen.     Action/Plan:   Met with pt and family, pt wife given card for free trial , she will activate the card. She was also given care for ongoing refills to receive discount on copay. Oxygen ordered from [facility].   Anticipated DC Date:  04/29/2013   Anticipated DC Plan:  HOME/SELF CARE         Choice offered to / List presented to:     DME arranged  [facility]      DME agency  [facility].        Status of service:  Completed, signed off Medicare Important Message given?   (If response is "NO", the following Medicare IM given date fields will be blank) Date Medicare IM given:   Date Additional Medicare IM given:    Discharge Disposition:  HOME/SELF CARE  Per UR Regulation:    If discussed at Long Length of Stay Meetings, dates discussed:    Comments:

## 2013-04-29 NOTE — Progress Notes (Signed)
SATURATION QUALIFICATIONS:   Patient Saturations on Room Air at Rest =  88% Patient Saturations on [employer] while Ambulating = 82%  Patient Saturations on 2 Liters of oxygen while Ambulating = 93%   Please briefly explain why patient needs home oxygen: SATURATION QUALIFICATIONS: Pt quickly became short of breath while walking without oxygen and O2 saturations declined. Once O2 applied at 2L pt stated is was easier for him to breathe and O2 saturations increased with the highest being 93%. Will alert Dr. Carles Collet and Case Management.

## 2013-04-29 NOTE — Plan of Care (Signed)
Problem: Phase III Progression Outcomes Goal: O2 sats > or equal to 93% on room air Outcome: Not Met (add Reason) Pt going home with oxygen

## 2013-04-29 NOTE — Telephone Encounter (Signed)
Disability papers to [facility], Therapist, sports.

## 2013-04-29 NOTE — Progress Notes (Signed)
Discharge documentation reviewed with pt and family; allowing time for questions and further explanation. Reeducated pt on Xarelto dosing and instructions on how to take medication. Teach back method used and pt able to verbalize complete understanding. Gave pt list of follow up appointments and locations.  Pt and wife verbalized understanding. IV removed. Pt left unit via wheelchair with help of volunteer and accompanied by wife.

## 2013-04-29 NOTE — Discharge Instructions (Signed)
Information on my medicine - XARELTO (rivaroxaban)  This medication education was reviewed with me or my healthcare representative as part of my discharge preparation.  The pharmacist that spoke with me during my hospital stay was:  Breton Berns, Rocky Crafts, [place]? Xarelto was prescribed to treat blood clots that may have been found in the veins of your legs (deep vein thrombosis) or in your lungs (pulmonary embolism) and to reduce the risk of them occurring again.  What do you need to know about Xarelto? The starting dose is one 15 mg tablet taken TWICE daily with food for the FIRST 21 DAYS then on (enter date)  05/19/13  the dose is changed to one 20 mg tablet taken ONCE A DAY with your evening meal.  DO NOT stop taking Xarelto without talking to the health care provider who prescribed the medication.  Refill your prescription for 20 mg tablets before you run out.  After discharge, you should have regular check-up appointments with your healthcare provider that is prescribing your Xarelto.  In the future your dose may need to be changed if your kidney function changes by a significant amount.  What do you do if you miss a dose? If you are taking Xarelto TWICE DAILY and you miss a dose, take it as soon as you remember. You may take two 15 mg tablets (total 30 mg) at the same time then resume your regularly scheduled 15 mg twice daily the next day.  If you are taking Xarelto ONCE DAILY and you miss a dose, take it as soon as you remember on the same day then continue your regularly scheduled once daily regimen the next day. Do not take two doses of Xarelto at the same time.   Important Safety Information Xarelto is a blood thinner medicine that can cause bleeding. You should call your healthcare provider right away if you experience any of the following:   Bleeding from an injury or your nose that does not stop.   Unusual colored urine (red or dark  brown) or unusual colored stools (red or black).   Unusual bruising for unknown reasons.   A serious fall or if you hit your head (even if there is no bleeding).  Some medicines may interact with Xarelto and might increase your risk of bleeding while on Xarelto. To help avoid this, consult your healthcare provider or pharmacist prior to using any new prescription or non-prescription medications, including herbals, vitamins, non-steroidal anti-inflammatory drugs (NSAIDs) and supplements.  This website has more information on Xarelto: [email or web address]

## 2013-05-02 ENCOUNTER — Encounter: Payer: Self-pay | Admitting: Radiation Oncology

## 2013-05-02 ENCOUNTER — Encounter: Payer: Self-pay | Admitting: Internal Medicine

## 2013-05-02 ENCOUNTER — Ambulatory Visit (HOSPITAL_BASED_OUTPATIENT_CLINIC_OR_DEPARTMENT_OTHER): Payer: BC Managed Care – PPO | Admitting: Internal Medicine

## 2013-05-02 ENCOUNTER — Ambulatory Visit
Admit: 2013-05-02 | Discharge: 2013-05-02 | Disposition: A | Discharge status: Home / Self Care | Payer: BC Managed Care – PPO | Attending: Radiation Oncology | Admitting: Radiation Oncology

## 2013-05-02 ENCOUNTER — Ambulatory Visit: Payer: BC Managed Care – PPO

## 2013-05-02 ENCOUNTER — Telehealth: Payer: Self-pay | Admitting: *Deleted

## 2013-05-02 ENCOUNTER — Other Ambulatory Visit (HOSPITAL_BASED_OUTPATIENT_CLINIC_OR_DEPARTMENT_OTHER): Payer: BC Managed Care – PPO

## 2013-05-02 ENCOUNTER — Telehealth: Payer: Self-pay | Admitting: Internal Medicine

## 2013-05-02 VITALS — BP 107/69 | HR 98 | Temp 97.5°F | Resp 18 | Ht 67.0 in | Wt 125.7 lb

## 2013-05-02 VITALS — BP 107/69 | HR 98 | Temp 97.5°F | Resp 18 | Wt 125.1 lb

## 2013-05-02 DIAGNOSIS — C7931 Secondary malignant neoplasm of brain: Secondary | ICD-10-CM | POA: Insufficient documentation

## 2013-05-02 DIAGNOSIS — C3490 Malignant neoplasm of unspecified part of unspecified bronchus or lung: Secondary | ICD-10-CM

## 2013-05-02 DIAGNOSIS — Z86711 Personal history of pulmonary embolism: Secondary | ICD-10-CM

## 2013-05-02 DIAGNOSIS — Z510 Encounter for antineoplastic radiation therapy: Secondary | ICD-10-CM | POA: Insufficient documentation

## 2013-05-02 DIAGNOSIS — C3430 Malignant neoplasm of lower lobe, unspecified bronchus or lung: Secondary | ICD-10-CM

## 2013-05-02 DIAGNOSIS — C7949 Secondary malignant neoplasm of other parts of nervous system: Secondary | ICD-10-CM

## 2013-05-02 DIAGNOSIS — G893 Neoplasm related pain (acute) (chronic): Secondary | ICD-10-CM

## 2013-05-02 DIAGNOSIS — Z79899 Other long term (current) drug therapy: Secondary | ICD-10-CM | POA: Insufficient documentation

## 2013-05-02 DIAGNOSIS — G4700 Insomnia, unspecified: Secondary | ICD-10-CM

## 2013-05-02 DIAGNOSIS — R0602 Shortness of breath: Secondary | ICD-10-CM | POA: Insufficient documentation

## 2013-05-02 DIAGNOSIS — R110 Nausea: Secondary | ICD-10-CM | POA: Insufficient documentation

## 2013-05-02 MED ORDER — OXYCODONE HCL 5 MG PO TABS: 5.0000 mg | ORAL_TABLET | ORAL | Status: DC | PRN

## 2013-05-02 MED ORDER — ONDANSETRON HCL 8 MG PO TABS: 8.0000 mg | ORAL_TABLET | Freq: Two times a day (BID) | ORAL | Status: DC | PRN

## 2013-05-02 MED ORDER — DEXAMETHASONE 4 MG PO TABS: ORAL_TABLET | ORAL | Status: DC

## 2013-05-02 MED ORDER — PROCHLORPERAZINE MALEATE 10 MG PO TABS: 10.0000 mg | ORAL_TABLET | Freq: Four times a day (QID) | ORAL | Status: DC | PRN

## 2013-05-02 NOTE — Progress Notes (Addendum)
Thoracic Location of Tumor / Histology:Small cell carcinoma of right lower lung 1.5 x 3.3 cm  Patient presented to emergency room with worsening dypsnea, cough with streaks of blood on 04/21/13. Patient states he had symptoms 2 weeks prior to ED visit, started on 2 rounds of anti-biotics first for sinus infection and then for pneumonia.  Biopsies of right lower lobe on 04/26/13 revealed small cell carcinoma.  Tobacco/Marijuana/Snuff/ETOH QIW:LNLGX smoker quit December 25, 2011.smoked cigarettes 2 ppd for 47 years.  Past/Anticipated interventions by  cardiothoracic surgery, if QJJ:HERDEYCX biopsy RLL  On 04/26/2013  Past/Anticipated interventions by medical oncology, if any:Pet scan ordered.Plan to start etoposide and cisplatin.  Signs/Symptoms  Weight changes, if any:8 to 10 lbs lost over last 30 days.  Respiratory complaints, if KGY:JEHUDJSHF of breath at all times and greater at bedtime.Patient does have home oxygen.  Hemoptysis, if any:yes  Pain issues, if WYO:VZCHYIFOY mid lung pain, has new script for oxycodone to pick up today.   SAFETY ISSUES:  Prior radiation?No   Pacemaker/ICD?No  Possible current pregnancy? No  Is the patient on methotrexate?No  Current Complaints / other details:patient diagnosed with post-obstructive pneumonia on po levofloxacin and takes rivaroxaban for pulmonary embolism as well as lower extremity deep vein thrombosis. 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 Code status:Full. Mri brain 04/29/13:1.5 cm ring-enhancing lesion of mid-brain and 1-2 punctate lesions in right parietal lobe. Mother died with copd, emphysema Sister diagnosed with breast cancer.

## 2013-05-02 NOTE — Patient Instructions (Signed)
Ondansetron tablets What is this medicine? ONDANSETRON (on DAN se tron) is used to treat nausea and vomiting caused by chemotherapy. It is also used to prevent or treat nausea and vomiting after surgery. This medicine may be used for other purposes; ask your health care provider or pharmacist if you have questions. COMMON BRAND NAME(S): Zofran What should I tell my health care provider before I take this medicine? They need to know if you have any of these conditions: -heart disease -history of irregular heartbeat -liver disease -low levels of magnesium or potassium in the blood -an unusual or allergic reaction to ondansetron, granisetron, other medicines, foods, dyes, or preservatives -pregnant or trying to get pregnant -breast-feeding How should I use this medicine? Take this medicine by mouth with a glass of water. Follow the directions on your prescription label. Take your doses at regular intervals. Do not take your medicine more often than directed. Talk to your pediatrician regarding the use of this medicine in children. Special care may be needed. Overdosage: If you think you have taken too much of this medicine contact a poison control center or emergency room at once. NOTE: This medicine is only for you. Do not share this medicine with others. What if I miss a dose? If you miss a dose, take it as soon as you can. If it is almost time for your next dose, take only that dose. Do not take double or extra doses. What may interact with this medicine? Do not take this medicine with any of the following medications: -apomorphine -certain medicines for fungal infections like fluconazole, itraconazole, ketoconazole, posaconazole, voriconazole -cisapride -dofetilide -dronedarone -pimozide -thioridazine -ziprasidone  This medicine may also interact with the following medications: -carbamazepine -certain medicines for depression, anxiety, or psychotic  disturbances -fentanyl -linezolid -MAOIs like Carbex, Eldepryl, Marplan, Nardil, and Parnate -methylene blue (injected into a vein) -other medicines that prolong the QT interval (cause an abnormal heart rhythm) -phenytoin -rifampicin -tramadol This list may not describe all possible interactions. Give your health care provider a list of all the medicines, herbs, non-prescription drugs, or dietary supplements you use. Also tell them if you smoke, drink alcohol, or use illegal drugs. Some items may interact with your medicine. What should I watch for while using this medicine? Check with your doctor or health care professional right away if you have any sign of an allergic reaction. What side effects may I notice from receiving this medicine? Side effects that you should report to your doctor or health care professional as soon as possible: -allergic reactions like skin rash, itching or hives, swelling of the face, lips or tongue -breathing problems -confusion -dizziness -fast or irregular heartbeat -feeling faint or lightheaded, falls -fever and chills -loss of balance or coordination -seizures -sweating -swelling of the hands or feet -tightness in the chest -tremors -unusually weak or tired Side effects that usually do not require medical attention (report to your doctor or health care professional if they continue or are bothersome): -constipation or diarrhea -headache This list may not describe all possible side effects. Call your doctor for medical advice about side effects. You may report side effects to FDA at 1-800-FDA-1088. Where should I keep my medicine? Keep out of the reach of children. Store between 2 and 30 degrees C (36 and 86 degrees F). Throw away any unused medicine after the expiration date. NOTE: This sheet is a summary. It may not cover all possible information. If you have questions about this medicine, talk to your doctor,  pharmacist, or health care  provider.  2014, [employer]. (2012-11-17 16:27:45) Prochlorperazine tablets What is this medicine? PROCHLORPERAZINE (proe klor PER a zeen) helps to control severe nausea and vomiting. This medicine is also used to treat schizophrenia. It can also help patients who experience anxiety that is not due to psychological illness. This medicine may be used for other purposes; ask your health care provider or pharmacist if you have questions. COMMON BRAND NAME(S): Compazine What should I tell my health care provider before I take this medicine? They need to know if you have any of these conditions: -blood disorders or disease -dementia -liver disease or jaundice -Parkinson's disease -uncontrollable movement disorder -an unusual or allergic reaction to prochlorperazine, other medicines, foods, dyes, or preservatives -pregnant or trying to get pregnant -breast-feeding How should I use this medicine? Take this medicine by mouth with a glass of water. Follow the directions on the prescription label. Take your doses at regular intervals. Do not take your medicine more often than directed. Do not stop taking this medicine suddenly. This can cause nausea, vomiting, and dizziness. Ask your doctor or health care professional for advice. Talk to your pediatrician regarding the use of this medicine in children. Special care may be needed. While this drug may be prescribed for children as young as 2 years for selected conditions, precautions do apply. Overdosage: If you think you have taken too much of this medicine contact a poison control center or emergency room at once. NOTE: This medicine is only for you. Do not share this medicine with others. What if I miss a dose? If you miss a dose, take it as soon as you can. If it is almost time for your next dose, take only that dose. Do not take double or extra doses. What may interact with this medicine? Do not take this medicine with any of the  following medications: -amoxapine -antidepressants like citalopram, escitalopram, fluoxetine, paroxetine, and sertraline -deferoxamine -dofetilide -maprotiline -tricyclic antidepressants like amitriptyline, clomipramine, imipramine, nortiptyline and others This medicine may also interact with the following medications: -lithium -medicines for pain -phenytoin -propranolol -warfarin This list may not describe all possible interactions. Give your health care provider a list of all the medicines, herbs, non-prescription drugs, or dietary supplements you use. Also tell them if you smoke, drink alcohol, or use illegal drugs. Some items may interact with your medicine. What should I watch for while using this medicine? Visit your doctor or health care professional for regular checks on your progress. You may get drowsy or dizzy. Do not drive, use machinery, or do anything that needs mental alertness until you know how this medicine affects you. Do not stand or sit up quickly, especially if you are an older patient. This reduces the risk of dizzy or fainting spells. Alcohol may interfere with the effect of this medicine. Avoid alcoholic drinks. This medicine can reduce the response of your body to heat or cold. Dress warm in cold weather and stay hydrated in hot weather. If possible, avoid extreme temperatures like saunas, hot tubs, very hot or cold showers, or activities that can cause dehydration such as vigorous exercise. This medicine can make you more sensitive to the sun. Keep out of the sun. If you cannot avoid being in the sun, wear protective clothing and use sunscreen. Do not use sun lamps or tanning beds/booths. Your mouth may get dry. Chewing sugarless gum or sucking hard candy, and drinking plenty of water may help. Contact your doctor if the problem  does not go away or is severe. What side effects may I notice from receiving this medicine? Side effects that you should report to your doctor  or health care professional as soon as possible: -blurred vision -breast enlargement in men or women -breast milk in women who are not breast-feeding -chest pain, fast or irregular heartbeat -confusion, restlessness -dark yellow or brown urine -difficulty breathing or swallowing -dizziness or fainting spells -drooling, shaking, movement difficulty (shuffling walk) or rigidity -fever, chills, sore throat -involuntary or uncontrollable movements of the eyes, mouth, head, arms, and legs -seizures -stomach area pain -unusually weak or tired -unusual bleeding or bruising -yellowing of skin or eyes Side effects that usually do not require medical attention (report to your doctor or health care professional if they continue or are bothersome): -difficulty passing urine -difficulty sleeping -headache -sexual dysfunction -skin rash, or itching This list may not describe all possible side effects. Call your doctor for medical advice about side effects. You may report side effects to FDA at 1-800-FDA-1088. Where should I keep my medicine? Keep out of the reach of children. Store at room temperature between 15 and 30 degrees C (59 and 86 degrees F). Protect from light. Throw away any unused medicine after the expiration date. NOTE: This sheet is a summary. It may not cover all possible information. If you have questions about this medicine, talk to your doctor, pharmacist, or health care provider.  2014, [employer]. (2011-07-01 16:59:39) Nausea and Vomiting Nausea is a sick feeling that often comes before throwing up (vomiting). Vomiting is a reflex where stomach contents come out of your mouth. Vomiting can cause severe loss of body fluids (dehydration). Children and elderly adults can become dehydrated quickly, especially if they also have diarrhea. Nausea and vomiting are symptoms of a condition or disease. It is important to find the cause of your symptoms. CAUSES   Direct  irritation of the stomach lining. This irritation can result from increased acid production (gastroesophageal reflux disease), infection, food poisoning, taking certain medicines (such as nonsteroidal anti-inflammatory drugs), alcohol use, or tobacco use.  Signals from the brain.These signals could be caused by a headache, heat exposure, an inner ear disturbance, increased pressure in the brain from injury, infection, a tumor, or a concussion, pain, emotional stimulus, or metabolic problems.  An obstruction in the gastrointestinal tract (bowel obstruction).  Illnesses such as diabetes, hepatitis, gallbladder problems, appendicitis, kidney problems, cancer, sepsis, atypical symptoms of a heart attack, or eating disorders.  Medical treatments such as chemotherapy and radiation.  Receiving medicine that makes you sleep (general anesthetic) during surgery. DIAGNOSIS Your caregiver may ask for tests to be done if the problems do not improve after a few days. Tests may also be done if symptoms are severe or if the reason for the nausea and vomiting is not clear. Tests may include:  Urine tests.  Blood tests.  Stool tests.  Cultures (to look for evidence of infection).  X-rays or other imaging studies. Test results can help your caregiver make decisions about treatment or the need for additional tests. TREATMENT You need to stay well hydrated. Drink frequently but in small amounts.You may wish to drink water, sports drinks, clear broth, or eat frozen ice pops or gelatin dessert to help stay hydrated.When you eat, eating slowly may help prevent nausea.There are also some antinausea medicines that may help prevent nausea. HOME CARE INSTRUCTIONS   Take all medicine as directed by your caregiver.  If you do not have an appetite,  do not force yourself to eat. However, you must continue to drink fluids.  If you have an appetite, eat a normal diet unless your caregiver tells you  differently.  Eat a variety of complex carbohydrates (rice, wheat, potatoes, bread), lean meats, yogurt, fruits, and vegetables.  Avoid high-fat foods because they are more difficult to digest.  Drink enough water and fluids to keep your urine clear or pale yellow.  If you are dehydrated, ask your caregiver for specific rehydration instructions. Signs of dehydration may include:  Severe thirst.  Dry lips and mouth.  Dizziness.  Dark urine.  Decreasing urine frequency and amount.  Confusion.  Rapid breathing or pulse. SEEK IMMEDIATE MEDICAL CARE IF:   You have blood or brown flecks (like coffee grounds) in your vomit.  You have black or bloody stools.  You have a severe headache or stiff neck.  You are confused.  You have severe abdominal pain.  You have chest pain or trouble breathing.  You do not urinate at least once every 8 hours.  You develop cold or clammy skin.  You continue to vomit for longer than 24 to 48 hours.  You have a fever. MAKE SURE YOU:   Understand these instructions.  Will watch your condition.  Will get help right away if you are not doing well or get worse. Document Released: 02/10/2005 Document Revised: 05/05/2011 Document Reviewed: 07/10/2010 [facility] Patient Information 2014 [place], [place]. Etoposide, VP-16 injection What is this medicine? ETOPOSIDE, VP-16 (e toe POE side) is a chemotherapy drug. It is used to treat testicular cancer, lung cancer, and other cancers. This medicine may be used for other purposes; ask your health care provider or pharmacist if you have questions. COMMON BRAND NAME(S): Etopophos, Toposar, VePesid What should I tell my health care provider before I take this medicine? They need to know if you have any of these conditions: -infection -kidney disease -low blood counts, like low white cell, platelet, or red cell counts -an unusual or allergic reaction to etoposide, other chemotherapeutic agents, other  medicines, foods, dyes, or preservatives -pregnant or trying to get pregnant -breast-feeding How should I use this medicine? This medicine is for infusion into a vein. It is administered in a hospital or clinic by a specially trained health care professional. Talk to your pediatrician regarding the use of this medicine in children. Special care may be needed. Overdosage: If you think you have taken too much of this medicine contact a poison control center or emergency room at once. NOTE: This medicine is only for you. Do not share this medicine with others. What if I miss a dose? It is important not to miss your dose. Call your doctor or health care professional if you are unable to keep an appointment. What may interact with this medicine? -cyclosporine -medicines to increase blood counts like filgrastim, pegfilgrastim, sargramostim -vaccines This list may not describe all possible interactions. Give your health care provider a list of all the medicines, herbs, non-prescription drugs, or dietary supplements you use. Also tell them if you smoke, drink alcohol, or use illegal drugs. Some items may interact with your medicine. What should I watch for while using this medicine? Visit your doctor for checks on your progress. This drug may make you feel generally unwell. This is not uncommon, as chemotherapy can affect healthy cells as well as cancer cells. Report any side effects. Continue your course of treatment even though you feel ill unless your doctor tells you to stop. In some  cases, you may be given additional medicines to help with side effects. Follow all directions for their use. Call your doctor or health care professional for advice if you get a fever, chills or sore throat, or other symptoms of a cold or flu. Do not treat yourself. This drug decreases your body's ability to fight infections. Try to avoid being around people who are sick. This medicine may increase your risk to bruise or  bleed. Call your doctor or health care professional if you notice any unusual bleeding. Be careful brushing and flossing your teeth or using a toothpick because you may get an infection or bleed more easily. If you have any dental work done, tell your dentist you are receiving this medicine. Avoid taking products that contain aspirin, acetaminophen, ibuprofen, naproxen, or ketoprofen unless instructed by your doctor. These medicines may hide a fever. Do not become pregnant while taking this medicine. Women should inform their doctor if they wish to become pregnant or think they might be pregnant. There is a potential for serious side effects to an unborn child. Talk to your health care professional or pharmacist for more information. Do not breast-feed an infant while taking this medicine. What side effects may I notice from receiving this medicine? Side effects that you should report to your doctor or health care professional as soon as possible: -allergic reactions like skin rash, itching or hives, swelling of the face, lips, or tongue -low blood counts - this medicine may decrease the number of white blood cells, red blood cells and platelets. You may be at increased risk for infections and bleeding. -signs of infection - fever or chills, cough, sore throat, pain or difficulty passing urine -signs of decreased platelets or bleeding - bruising, pinpoint red spots on the skin, black, tarry stools, blood in the urine -signs of decreased red blood cells - unusually weak or tired, fainting spells, lightheadedness -breathing problems -changes in vision -mouth or throat sores or ulcers -pain, redness, swelling or irritation at the injection site -pain, tingling, numbness in the hands or feet -redness, blistering, peeling or loosening of the skin, including inside the mouth -seizures -vomiting Side effects that usually do not require medical attention (report to your doctor or health care professional  if they continue or are bothersome): -diarrhea -hair loss -loss of appetite -nausea -stomach pain This list may not describe all possible side effects. Call your doctor for medical advice about side effects. You may report side effects to FDA at 1-800-FDA-1088. Where should I keep my medicine? This drug is given in a hospital or clinic and will not be stored at home. NOTE: This sheet is a summary. It may not cover all possible information. If you have questions about this medicine, talk to your doctor, pharmacist, or health care provider.  2014, [employer] Standard. (2007-06-14 17:24:12) Cisplatin injection What is this medicine? CISPLATIN (SIS pla tin) is a chemotherapy drug. It targets fast dividing cells, like cancer cells, and causes these cells to die. This medicine is used to treat many types of cancer like bladder, ovarian, and testicular cancers. This medicine may be used for other purposes; ask your health care provider or pharmacist if you have questions. COMMON BRAND NAME(S): Platinol -AQ, Platinol What should I tell my health care provider before I take this medicine? They need to know if you have any of these conditions: -blood disorders -hearing problems -kidney disease -recent or ongoing radiation therapy -an unusual or allergic reaction to cisplatin, carboplatin, other chemotherapy, other  medicines, foods, dyes, or preservatives -pregnant or trying to get pregnant -breast-feeding How should I use this medicine? This drug is given as an infusion into a vein. It is administered in a hospital or clinic by a specially trained health care professional. Talk to your pediatrician regarding the use of this medicine in children. Special care may be needed. Overdosage: If you think you have taken too much of this medicine contact a poison control center or emergency room at once. NOTE: This medicine is only for you. Do not share this medicine with others. What if I miss a  dose? It is important not to miss a dose. Call your doctor or health care professional if you are unable to keep an appointment. What may interact with this medicine? -dofetilide -foscarnet -medicines for seizures -medicines to increase blood counts like filgrastim, pegfilgrastim, sargramostim -probenecid -pyridoxine used with altretamine -rituximab -some antibiotics like amikacin, gentamicin, neomycin, polymyxin B, streptomycin, tobramycin -sulfinpyrazone -vaccines -zalcitabine Talk to your doctor or health care professional before taking any of these medicines: -acetaminophen -aspirin -ibuprofen -ketoprofen -naproxen This list may not describe all possible interactions. Give your health care provider a list of all the medicines, herbs, non-prescription drugs, or dietary supplements you use. Also tell them if you smoke, drink alcohol, or use illegal drugs. Some items may interact with your medicine. What should I watch for while using this medicine? Your condition will be monitored carefully while you are receiving this medicine. You will need important blood work done while you are taking this medicine. This drug may make you feel generally unwell. This is not uncommon, as chemotherapy can affect healthy cells as well as cancer cells. Report any side effects. Continue your course of treatment even though you feel ill unless your doctor tells you to stop. In some cases, you may be given additional medicines to help with side effects. Follow all directions for their use. Call your doctor or health care professional for advice if you get a fever, chills or sore throat, or other symptoms of a cold or flu. Do not treat yourself. This drug decreases your body's ability to fight infections. Try to avoid being around people who are sick. This medicine may increase your risk to bruise or bleed. Call your doctor or health care professional if you notice any unusual bleeding. Be careful brushing  and flossing your teeth or using a toothpick because you may get an infection or bleed more easily. If you have any dental work done, tell your dentist you are receiving this medicine. Avoid taking products that contain aspirin, acetaminophen, ibuprofen, naproxen, or ketoprofen unless instructed by your doctor. These medicines may hide a fever. Do not become pregnant while taking this medicine. Women should inform their doctor if they wish to become pregnant or think they might be pregnant. There is a potential for serious side effects to an unborn child. Talk to your health care professional or pharmacist for more information. Do not breast-feed an infant while taking this medicine. Drink fluids as directed while you are taking this medicine. This will help protect your kidneys. Call your doctor or health care professional if you get diarrhea. Do not treat yourself. What side effects may I notice from receiving this medicine? Side effects that you should report to your doctor or health care professional as soon as possible: -allergic reactions like skin rash, itching or hives, swelling of the face, lips, or tongue -signs of infection - fever or chills, cough, sore throat, pain or  difficulty passing urine -signs of decreased platelets or bleeding - bruising, pinpoint red spots on the skin, black, tarry stools, nosebleeds -signs of decreased red blood cells - unusually weak or tired, fainting spells, lightheadedness -breathing problems -changes in hearing -gout pain -low blood counts - This drug may decrease the number of white blood cells, red blood cells and platelets. You may be at increased risk for infections and bleeding. -nausea and vomiting -pain, swelling, redness or irritation at the injection site -pain, tingling, numbness in the hands or feet -problems with balance, movement -trouble passing urine or change in the amount of urine Side effects that usually do not require medical  attention (report to your doctor or health care professional if they continue or are bothersome): -changes in vision -loss of appetite -metallic taste in the mouth or changes in taste This list may not describe all possible side effects. Call your doctor for medical advice about side effects. You may report side effects to FDA at 1-800-FDA-1088. Where should I keep my medicine? This drug is given in a hospital or clinic and will not be stored at home. NOTE: This sheet is a summary. It may not cover all possible information. If you have questions about this medicine, talk to your doctor, pharmacist, or health care provider.  2014, [employer]. (2007-05-18 14:40:54) Lung Cancer Lung cancer is an abnormal growth of cells in one or both of your lungs. These extra cells may form a mass of tissue called a growth or tumor. Tumors can be either cancerous (malignant) or not cancerous (benign).  Lung cancer is the most common cause of cancer death in men and women. There are several different types of lung cancers. Usually, lung cancer is described as either small cell lung cancer or nonsmall cell lung cancer. Other types of cancer occur in the lungs, including carcinoid and cancers spread from other organs. The types of cancer have different behavior and treatment. RISK FACTORS Smoking is the most common risk factor for developing lung cancer. Other risk factors include:  Radon gas exposure.  Asbestos and other industrial substance exposure.  Second hand tobacco smoke.  Air pollution.  Family or personal history of lung cancer.  Age older than 47 years. CAUSES  Lung cancer usually starts when the lungs are exposed to harmful chemicals. Smoking is the most common risk factor for lung cancer. When you quit smoking, your risk of lung cancer falls each year (but is never the same as a person who has never smoked).  SYMPTOMS  Lung cancer may not have any symptoms in its early stages. The  symptoms can depend on the type of cancer, its location, and other factors. Symptoms can include:  Cough (either new, different, or more severe).  Shortness of breath.  Coughing up blood (hemoptysis).  Chest pain.  Hoarseness.  Swelling of the face.  Drooping eyelid.  Changes in blood tests, such as low sodium (hyponatremia), high calcium (hypercalcemia), or low blood count (anemia).  Weight loss. DIAGNOSIS  Your health care provider may suspect lung cancer based on your symptoms or based on tests obtained for other reasons. Tests or procedures used to find or confirm the presence of lung cancer may include:  Chest X-ray.  CT scan of the lungs and chest.  Blood tests.  Taking a tissue sample (biopsy) from your lung to look for cancer cells. Your cancer will be staged to determine its severity and extent. Staging is a careful attempt to find out the size  of the tumor, whether the cancer has spread, and if so, to what parts of the body. You may need to have more tests to determine the stage of your cancer. The test results will help determine what treatment plan is best for you.   Stage 0 This is the earliest stage of lung cancer. In this stage the tumor is present in only a few layers of cells and has not grown beyond the inner lining of the lungs. Stage 0 (carcinoma in situ) is considered noninvasive, meaning at this stage it is not yet capable of spreading to other regions.  Stage I The cancer is located only in the lungs and not spread to any lymph nodes.  Stage II The cancer is in the lungs and the nearby lymph nodes.  Stage III The cancer is in the lungs and the lymph nodes in the middle of the chest. This is also called locally advanced disease. This stage has two subtypes:  Stage IIIa  The cancer has spread only to lymph nodes on the same side of the chest where the cancer started.  Stage IIIb  The cancer has spread to lymph nodes on the opposite side of the chest or  above the collar bone.  Stage IV This is the most advanced stage of lung cancer and is also called advanced disease. This stage describes when the cancer has spread to both lungs, the fluid in the area around the lungs, or to another body part. Your health care provider may tell you the detailed stage of your cancer, which includes both a number and a letter.  TREATMENT  Depending on the type and stage, lung cancer may be treated with surgery, radiation therapy, chemotherapy, or targeted therapy. Some people have a combination of these therapies. Your treatment plan will be developed by your health care team.  [place] not smoke.  Only take over-the-counter or prescription medicines for pain, discomfort, or fever as directed by your health care provider.  Maintain a healthy diet.  Consider joining a support group. This may help you learn to cope with the stress of having lung cancer.  Seek advice to help you manage treatment side effects.  Keep all follow-up appointments as directed by your health care provider.  Inform your cancer specialist if you are admitted to the hospital. [place] IF:   You are losing weight without trying.  You have a persistent cough.  You feel short of breath.  You tire easily. SEEK IMMEDIATE MEDICAL CARE IF:   You cough up clotted blood or bright red blood.  Your pain is not manageable or controlled by medicine.  You develop new difficulty breathing or chest pain.  You develop swelling in one or both ankles or legs, or swelling in your face or neck.  You develop headache or confusion. Document Released: 05/19/2000 Document Revised: 12/01/2012 Document Reviewed: 09/29/2012 [facility] Patient Information 2014 [place], [place].

## 2013-05-02 NOTE — Progress Notes (Signed)
Put [employer] disability paper on nurse's desk.

## 2013-05-02 NOTE — Progress Notes (Signed)
Checked in new pt with no financial concerns. °

## 2013-05-02 NOTE — Progress Notes (Signed)
[facility] Simulation and Treatment Planning Note   Name: Ricky Villanueva MRN: [id number]  Date: 05/02/2013  DOB: June 20, 1950  Status: outpatient    DIAGNOSIS: Extensive stage small cell lung cancer   SIDE: right   CONSENT VERIFIED: yes   SET UP AND IMMOBILIZATION: Patient is setup supine with arms in a wing board and an aquaplast mask for brain immobilization .   NARRATIVE: The patient was brought to the [facility].  Identity was confirmed.  All relevant records and images related to the planned course of therapy were reviewed.  Then, the patient was positioned in a stable reproducible clinical set-up for radiation therapy.  A mask was made to immobilize the brain. CT images were obtained.  Skin markings were placed and marks were made on the mask.  The CT images were loaded into the planning software where the target and avoidance structures were contoured.  The radiation prescription was entered and confirmed.   TREATMENT PLANNING NOTE:  Treatment planning then occurred. I have requested 3D simulation with [facility] of the spinal cord, total lungs and gross tumor volume. I have also requested mlcs and an isodose plan.   A total of 5 complex treatment devices were ordered today including MLCs which will be used to block critical normal structures and the mask for head immobilization.

## 2013-05-02 NOTE — Telephone Encounter (Signed)
Per staff message and POF I have scheduled appts.  JMW  

## 2013-05-02 NOTE — Telephone Encounter (Signed)
gv adn printed appt sched and avs for pt for March adn April....sed added tx...emailed MW to add tx for tomorrow

## 2013-05-02 NOTE — Progress Notes (Signed)
Department of Radiation Oncology  Phone:  [phone] Fax:        [phone]   Name: Ricky Villanueva MRN: [id number]  DOB: [birth year]  Date: 05/02/2013  Follow Up Visit Note  Diagnosis: Extensive stage small cell lung cancer  Interval History: Ricky Villanueva presents today for routine followup.  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  His breathing is stable.  He has had no hemoptysis. He has no headaches or focal numbess or weakness. He met with Dr. Juliann Mule this morning.  He is ready to proceed with treatment. He is accompanied by his wife.   Allergies:  Allergies  Allergen Reactions  . Augmentin [Amoxicillin-Pot Clavulanate] Itching and Rash    rash    Medications:  Current Outpatient Prescriptions  Medication Sig Dispense Refill  . albuterol (PROVENTIL HFA;VENTOLIN HFA) 108 (90 BASE) MCG/ACT inhaler Inhale 2 puffs into the lungs every 6 (six) hours as needed for wheezing or shortness of breath.  1 Inhaler  2  . Cholecalciferol (VITAMIN D) 2000 UNITS CAPS Take 2,000 Units by mouth daily.       [name] KRILL OIL PO Take 1 tablet by mouth daily.      [name] lisinopril (PRINIVIL,ZESTRIL) 10 MG tablet Take 10 mg by mouth daily.       . pantoprazole (PROTONIX) 40 MG tablet Take 1 tablet (40 mg total) by mouth daily.  90 tablet  0  . ranitidine (ZANTAC) 300 MG capsule Take 1 capsule (300 mg total) by mouth daily.  90 capsule  0  . Rivaroxaban (XARELTO) 15 MG TABS tablet Take 1 tablet (15 mg total) by mouth 2 (two) times daily with a meal. X 19 days  38 tablet  0  . Rivaroxaban (XARELTO) 20 MG TABS tablet Take 1 tablet (20 mg total) by mouth daily with supper. Start AFTER 59m dose is finished (05/19/13)  30 tablet  1  . tiotropium (SPIRIVA HANDIHALER) 18 MCG inhalation capsule Place 1 capsule (18 mcg total) into inhaler and inhale daily.  30 capsule  11  . dexamethasone (DECADRON) 4 MG tablet Take 2 tablets by mouth once a day on the day after  chemotherapy and then take 2 tablets two times a day for 2 days. Take with food.  30 tablet  1  . ondansetron (ZOFRAN) 8 MG tablet Take 1 tablet (8 mg total) by mouth 2 (two) times daily as needed. Start on the third day after chemotherapy.  30 tablet  1  . oxyCODONE (OXY IR/ROXICODONE) 5 MG immediate release tablet Take 1 tablet (5 mg total) by mouth every 4 (four) hours as needed for severe pain.  60 tablet  0  . prochlorperazine (COMPAZINE) 10 MG tablet Take 1 tablet (10 mg total) by mouth every 6 (six) hours as needed (Nausea or vomiting).  30 tablet  1   No current facility-administered medications for this encounter.    Physical Exam:  Filed Vitals:   05/02/13 1255  BP: 107/69  Pulse: 98  Temp: 97.5 F (36.4 C)  Resp: 18  Weight: 125 lb 1.6 oz (56.745 kg)  SpO2: 92%   No respiratory distress. Alert and oriented.  IMPRESSION: ABraxleyis a 63y.o. male with extensive stage small cell lung cancer  PLAN:  We discussed the prognosis of small cell lung cancer with brain metastases.  We discussed the process of simulation and making of a mask.  We discussed 12 treatments as an  outpatient with the most common side effects are skin irritation, hair loss, fatigue, and esophagitis. He signed informed consent and agreed to procced forward.  He will discuss chemotherapy with Dr. Juliann Mule after this.  He and his wife met with our social worker today as well.     [name], MD

## 2013-05-02 NOTE — Progress Notes (Signed)
Please see the Nurse Progress Note in the MD Initial Consult Encounter for this patient. 

## 2013-05-02 NOTE — Telephone Encounter (Signed)
per 2nd 3/9 pof cx pet/ct and chemo this wk and f/u next wk. per pof plan to start tx 3/26 and schedule f/u for 3/23. tx started 3/25 due to this is a 3day tx and 3/23 is a thursday. lmonvm for pt re cancellations and next appt in office for 3/23. schedule mailed. order for pet/ct cancelled by provider and no appts had been scheduled.

## 2013-05-03 ENCOUNTER — Ambulatory Visit: Payer: BC Managed Care – PPO

## 2013-05-03 ENCOUNTER — Ambulatory Visit
Admission: RE | Admit: 2013-05-03 | Discharge: 2013-05-03 | Disposition: A | Discharge status: Home / Self Care | Payer: BC Managed Care – PPO | Source: Ambulatory Visit | Attending: Radiation Oncology | Admitting: Radiation Oncology

## 2013-05-03 ENCOUNTER — Ambulatory Visit
Admission: RE | Admit: 2013-05-03 | Payer: BC Managed Care – PPO | Source: Ambulatory Visit | Admitting: Radiation Oncology

## 2013-05-03 ENCOUNTER — Other Ambulatory Visit: Payer: BC Managed Care – PPO

## 2013-05-03 ENCOUNTER — Encounter: Payer: Self-pay | Admitting: Internal Medicine

## 2013-05-03 ENCOUNTER — Encounter: Payer: Self-pay | Admitting: Radiation Oncology

## 2013-05-03 NOTE — Progress Notes (Signed)
Ricky Villanueva OFFICE PROGRESS NOTE  [name], [place] [address]  DIAGNOSIS: Small cell carcinoma of lung - Plan: CBC with Differential, Comprehensive metabolic panel (Cmet) - [facility], Lactate dehydrogenase (LDH) - [facility], Ambulatory referral to Nutrition and Diabetic Education, Ambulatory referral to Audiology, dexamethasone (DECADRON) 4 MG tablet, ondansetron (ZOFRAN) 8 MG tablet, prochlorperazine (COMPAZINE) 10 MG tablet, CBC with Differential, Basic metabolic panel (Bmet) - [facility], CANCELED: NM PET Image Initial (PI) Skull Base To Thigh  Chief Complaint  Patient presents with  . Small cell carcinoma of Right lung    CURRENT TREATMENT: Planning WB XRT to start 03/10 per Dr. Pablo Ledger.   INTERVAL HISTORY: Ricky Villanueva 63 y.o. 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  I initially consulted on this gentlemen on 04/27/2013.    His past medical history also includes HTN/Hyperlipidemia and longstanding smoking history but quit 12/25/2011 presented to ER on 02/26 with worsening dyspnea, cough with streaks of blood over the past one week. In the ED, his oxygen saturation was 91% on 2 L nasal canula. CXR revealed a progressive increased density in the right lower lobe consistent with worsening atelectasis or pneumonia and a soft tissue fullness in the hilar region suspicious for lymphadenopathy or mass.and follow-up CT of chest was recommended. It revealed a nonocclusive left upper lobe, right lower lobe pulmonary emboli with right heart strain. Bulky mediastinal and right hilar lymphadenopathy, encasing the of pulmonary arteries, pulmonary veins and bronchi, completely effacing right lower lobe segmental bronchi. Extensive consolidation in the right lung suggested postobstructive pneumonia, with interstitial prominence which may reflect superimposed lymphangitic spread of infection  or neoplasm. He was admitted and started on antibiotics and heparin. Pulmonary was consulted and he had a video bronchoscopy on 03/03 with consistent with small cell ca of lung. 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   Today, patient is accompanied by his wife Ricky Villanueva.  He also has a scheduled appointment with Dr. Pablo Ledger later today. He reports smoking from age 77 until 2013 with a 2 pack per day history over the past 20 years.  He lives about 68 miles from [place], [place]. He reports that his weight is stable. 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 He states that it requiring frequently up steps daily and over the past one week he has been unable due to the dyspnea. He also reports hoarseness over the past one week. His family history is notable for a sister with breast cancer diagnosed at age 71.    MEDICAL HISTORY: Past Medical History  Diagnosis Date  . Hyperlipidemia   . Hypertension   . GERD (gastroesophageal reflux disease)   . Respiratory failure with hypoxia 04/21/2013    secondary to pneumonia/notes 04/21/2013  . Pulmonary embolism     "got one in there now" (04/21/2013)  . COPD (chronic obstructive pulmonary disease)   . Pneumonia 2/?01/2014    "wouldn't get better; hospitalized 04/21/2013)  . Arthritis     "minor in my right hand" (04/21/2013)    INTERIM HISTORY: has GERD (gastroesophageal reflux disease); HCAP (healthcare-associated pneumonia); Respiratory failure with hypoxia; HTN (hypertension); Acute pulmonary embolism; Lung mass; Emphysema lung; and Small  cell carcinoma of lung on his problem list.    ALLERGIES:  is allergic to augmentin.  MEDICATIONS: has a current medication list which includes the following prescription(s): albuterol, vitamin d, krill oil, lisinopril, pantoprazole,  ranitidine, rivaroxaban, tiotropium, dexamethasone, ondansetron, oxycodone, prochlorperazine, and rivaroxaban.  SURGICAL HISTORY:  Past Surgical History  Procedure Laterality Date  . Inguinal hernia repair Right ~ 2005  . Finger surgery Left ~ 1989    "crushed end of my middle finger off"   . Video bronchoscopy Bilateral 04/26/2013    Procedure: VIDEO BRONCHOSCOPY WITH FLUORO;  Surgeon: Wilhelmina Mcardle, MD;  Location: [facility] ENDOSCOPY;  Service: Cardiopulmonary;  Laterality: Bilateral;    REVIEW OF SYSTEMS:   Constitutional: Denies fevers, chills or abnormal weight loss Eyes: Denies blurriness of vision Ears, nose, mouth, throat, and face: Denies mucositis or sore throat Respiratory: He reports cough, dyspnea  But denies wheezes Cardiovascular: Denies palpitation, chest discomfort or lower extremity swelling Gastrointestinal:  Denies nausea, heartburn or change in bowel habits Skin: Denies abnormal skin rashes Lymphatics: Denies new lymphadenopathy or easy bruising Neurological:Denies numbness, tingling or new weaknesses Behavioral/Psych: Mood is stable, no new changes  All other systems were reviewed with the patient and are negative.  PHYSICAL EXAMINATION: ECOG PERFORMANCE STATUS: 1 - Symptomatic but completely ambulatory  Blood pressure 107/69, pulse 98, temperature 97.5 F (36.4 C), temperature source Oral, resp. rate 18, height 5\' 7"  (1.702 m), weight 125 lb 11.2 oz (57.017 kg), SpO2 92.00%.  GENERAL:alert, no distress and comfortable, thin chronically ill appearing gentleman SKIN: skin color, texture, turgor are normal, no rashes or significant lesions EYES: normal, Conjunctiva are pink and non-injected, sclera clear OROPHARYNX:no exudate, no erythema and lips, buccal mucosa, and tongue normal  NECK: supple, thyroid normal size, non-tender, without nodularity LYMPH:  no palpable lymphadenopathy in the cervical, axillary or supraclavicular LUNGS: clear to auscultation with normal  breathing effort, no wheezes or rhonchi on the left. R lung with decreased breath sound at the bases.  HEART: regular rate & rhythm and no murmurs and no lower extremity edema ABDOMEN:abdomen soft, non-tender and normal bowel sounds Musculoskeletal:no cyanosis of digits and no clubbing  NEURO: alert & oriented x 3 with fluent speech, no focal motor/sensory deficits  Labs:  Lab Results  Component Value Date   WBC 10.8* 04/29/2013   HGB 11.1* 04/29/2013   HCT 32.8* 04/29/2013   MCV 93.7 04/29/2013   PLT 368 04/29/2013   NEUTROABS 11.8* 04/21/2013      Chemistry      Component Value Date/Time   NA 140 04/28/2013 0505   K 4.3 04/28/2013 0505   CL 100 04/28/2013 0505   CO2 26 04/28/2013 0505   BUN 9 04/28/2013 0505   CREATININE 0.85 04/28/2013 0505      Component Value Date/Time   CALCIUM 9.4 04/28/2013 0505   ALKPHOS 70 04/22/2013 0406   AST 14 04/22/2013 0406   ALT 10 04/22/2013 0406   BILITOT 0.3 04/22/2013 0406       Basic Metabolic Panel:  Recent Labs Lab 04/28/13 0505  NA 140  K 4.3  CL 100  CO2 26  GLUCOSE 93  BUN 9  CREATININE 0.85  CALCIUM 9.4   GFR Estimated Creatinine Clearance: 73.6 ml/min (by C-G formula based on Cr of 0.85). Liver Function Tests:  CBC:  Recent Labs Lab 04/27/13 0543 04/28/13 0505 04/29/13 0503  WBC 13.2* 9.0 10.8*  HGB 11.6* 10.5* 11.1*  HCT 33.8* 31.0* 32.8*  MCV 94.2 94.2 93.7  PLT 334 329 368    Studies:  No results found.   RADIOGRAPHIC STUDIES: Dg Chest 2 View  04/21/2013   CLINICAL DATA:  Cough and hemoptysis  EXAM: CHEST  2 VIEW  COMPARISON:  DG CHEST 2V dated 04/14/2013  FINDINGS: There has been an interval increase in the density of the confluent infiltrate in the right lower lobe consistent with progressive pneumonia. The right middle and upper lobes appear clear. The left lung appears clear. No significant pleural effusion is demonstrated. The cardiac silhouette is normal in size. The pulmonary vascularity is not engorged. The trachea  is midline. There is soft tissue fullness in the right hilar region which may reflect lymphadenopathy. This is not new.  IMPRESSION: Progressive increased density in the right lower lobe is present and is consistent with worsening atelectasis or pneumonia. Soft tissue fullness in the hilar region is suspicious for lymphadenopathy or mass. Chest CT scanning is recommended.   Electronically Signed   By: Ronnett Pullin  [place]   On: 04/21/2013 13:48   Dg Chest 2 View  04/18/2013   CLINICAL DATA:  Cough  EXAM: CHEST  2 VIEW  COMPARISON:  02/05/2004  FINDINGS: There is dense consolidation in the right lower lobe consistent with pneumonia. Small area of mild consolidation is noted at the base of the right middle lobe.  Lungs are hyperexpanded but otherwise clear. No pleural effusion or pneumothorax.  Cardiac silhouette is normal in size. There is fullness of the right hilum suggesting reactive adenopathy.  IMPRESSION: 1. Right lower lobe pneumonia with a small focus of right middle lobe infiltrate. Fullness of the right hilum suggests reactive right hilar adenopathy.   Electronically Signed   By: Lajean Manes M.D.   On: 04/18/2013 08:13   Ct Angio Chest W/cm &/or Wo Cm  04/21/2013   CLINICAL DATA:  Hemoptysis, shortness of breath, right chest pain.  EXAM: CT ANGIOGRAPHY CHEST WITH CONTRAST  TECHNIQUE: Multidetector CT imaging of the chest was performed using the standard protocol during bolus administration of intravenous contrast. Multiplanar CT image reconstructions and MIPs were obtained to evaluate the vascular anatomy.  CONTRAST:  13mL OMNIPAQUE IOHEXOL 350 MG/ML SOLN  COMPARISON:  DG CHEST 2 VIEW dated 04/21/2013; DG CHEST 2V dated 04/14/2013  FINDINGS: Main pulmonary artery is not enlarged. Nonocclusive segmental to subsegmental left upper lobe pulmonary embolus, bulky right hilar lymphadenopathy encases and narrows the right lower lobe pulmonary arteries, right middle lobe pulmonary vein. Nonocclusive pulmonary  embolism right lower lobe segmental branches. Hilar lymphadenopathy encases the right middle lobe and lower lobe bronchi, incompletely and occludes the right lower lobe subsegmental bronchi. Patchy consolidation throughout the right lung, with interstitial prominence. More focal consolidation within the medial segment of the right lower lobe measuring 1.5 x 3.3 cm.  Mildly cystic 10 mm right peritracheal lymph nodes, a pretracheal 25 mm subcarinal 28 x 53 mm nodal conglomeration bulky right hilar lymphadenopathy. Severe centrilobular emphysema, with apical bullous changes. No pleural effusions. No left pulmonary nodule or mass.  The heart size is not enlarged, apparent right heart strain. Pericardium is nonsuspicious. Mild Coronary artery calcifications. Thoracic aorta is normal in course and caliber thickening vessels. Included view of the abdomen is nonsuspicious appearing bilateral acromioclavicular osteoarthrosis. .  Review of the MIP images confirms the above findings.  IMPRESSION: Nonocclusive left upper lobe, right lower lobe pulmonary emboli. Right heart strain.  Bulky mediastinal and right hilar lymphadenopathy, encasing the of pulmonary arteries, pulmonary veins and bronchi, completely effacing right lower  lobe segmental bronchi. Extensive consolidation in the right lung suggests postobstructive pneumonia, with interstitial prominence which may reflect superimposed lymphangitic spread of infection or neoplasm. Constellation of findings are highly concerning for neoplastic process, with consolidated focus in the right lung base, unclear whether this reflects lung primary. Additional consideration is lymphoma. Findings less likely reflect autoimmune disorders such as sarcoid.  Critical Value/emergent results were called by telephone at the time of interpretation on 04/21/2013 at 4:00 PM to [facility], who verbally acknowledged these results.   Electronically Signed   By: Elon Alas   On:  04/21/2013 16:04   Mr Brain W Wo Contrast  04/29/2013   CLINICAL DATA:  Lung cancer staging.  EXAM: MRI HEAD WITHOUT AND WITH CONTRAST  TECHNIQUE: Multiplanar, multiecho pulse sequences of the brain and surrounding structures were obtained without and with intravenous contrast.  CONTRAST:  13 mL MultiHance  COMPARISON:  None.  FINDINGS: There is a 1.5 x 0.9 cm ring-enhancing lesion in the upper right pons/lower right cerebral peduncle which demonstrates mild restricted diffusion at its periphery. There is no evidence of significant surrounding vasogenic edema. There is also a punctate, 3 mm focus enhancement with restricted diffusion in the periatrial white matter of the right parietal lobe (series 12, image 28). There is also a second questionable focus of restricted diffusion slightly more superiorly and medially in the subcortical white matter of the right parietal lobe (series 4, image 20), with question of very faint enhancement (series 12, image 31). No other enhancing lesions are identified.  There is no evidence of intracranial hemorrhage, midline shift, or extra-axial fluid collection. Mild age-related cerebral atrophy is present. Orbits are unremarkable. Paranasal sinuses and mastoid air cells are clear. Major intracranial vascular flow voids are preserved.  IMPRESSION: 1.5 cm ring-enhancing lesion in the midbrain and 1-2 punctate lesions in the right parietal lobe. Findings are concerning for metastatic disease, however subacute ischemia is an additional consideration. Short-term follow-up may be helpful to assess for change.   Electronically Signed   By: Logan Bores   On: 04/29/2013 14:20   Ct Abdomen Pelvis W Contrast  04/28/2013   CLINICAL DATA:  New diagnosis of small cell lung cancer.  EXAM: CT ABDOMEN AND PELVIS WITH CONTRAST  TECHNIQUE: Multidetector CT imaging of the abdomen and pelvis was performed using the standard protocol following bolus administration of intravenous contrast.  CONTRAST:   12mL OMNIPAQUE IOHEXOL 300 MG/ML  SOLN  COMPARISON:  Chest CT 04/21/2013  FINDINGS: Dense right lower lobe airspace consolidation is again noted.  Three or 4 small low-attenuation liver lesions are too small to accurately characterize. These could be cysts but could not exclude metastasis. The gallbladder is normal. No common bowel duct dilatation. The spleen is normal in size. No focal lesions. The pancreas is normal. The adrenal glands and kidneys are unremarkable. Small renal cysts are noted.  The stomach, duodenum, small bowel and colon are unremarkable. No inflammatory changes, mass lesions or obstructive findings. No mesenteric or retroperitoneal mass or adenopathy. Small scattered lymph nodes are noted. Advanced atherosclerotic calcifications involving the aorta. The major branch vessels are patent.  The bladder, prostate gland and seminal vesicles are unremarkable. No pelvic mass, adenopathy or free pelvic fluid collections. No inguinal mass or adenopathy.  The bony structures are unremarkable. No destructive bone lesions or spinal canal compromise.  IMPRESSION: Small low-attenuation liver lesions are indeterminate but are most likely small hepatic cysts.  No other significant findings in the and.  Age  advanced atherosclerotic changes involving the aorta and iliac vessels.   Electronically Signed   By: Kalman Jewels M.D.   On: 04/28/2013 14:25   PATHOLOGY: Diagnosis 04/26/2013 Bronchus, biopsy, RLL - POORLY DIFFERENTIATED SMALL CELL NEUROENDOCRINE CARCINOMA. Microscopic Comment The biopsies are extensively involved by an atypical infiltrate with histologic features of small cell carcinoma. Immunohistochemistry is performed and the tumor is positive for CD56, Chromogranin, Synaptophysin, Thyroid Transcription Factor - 1, Cytokeratin 7 and Cytokeratin AE1 / AE3. Tumor is negative for Cytokeratin 56 and mostly negative with p63. The histologic and immunophenotypic characteristics are consistent  with poorly differentiated small cell neuroendocrine carcinoma. Case discussed with Dr. Alva Garnet on 04/27/13. (JDP:caf 04/28/13) Claudette Laws MD Pathologist, Electronic Signature (Case signed 04/28/2013) Specimen Gross and Clinical Information Specimen(s) Obtained: Bronchus, biopsy, RLL  ASSESSMENT: Ricky Villanueva 64 y.o. male with a history of Small cell carcinoma of lung - Plan: CBC with Differential, Comprehensive metabolic panel (Cmet) - [facility], Lactate dehydrogenase (LDH) - [facility], Ambulatory referral to Nutrition and Diabetic Education, Ambulatory referral to Audiology, dexamethasone (DECADRON) 4 MG tablet, ondansetron (ZOFRAN) 8 MG tablet, prochlorperazine (COMPAZINE) 10 MG tablet, CBC with Differential, Basic metabolic panel (Bmet) - [facility], CANCELED: NM PET Image Initial (PI) Skull Base To Thigh  63 yo male with newly diagnosed Small cell carcinoma of the R lung complicated by obstructive pneumonia, PE. ECOG 1. No weight loss.   We reviewed his images and cytology results consistent with the above.  We reviewed some general prognostic indicators include lack of weight lost, functional status. He denies hearing problems or Kidney problems or numbness or tingling of feet or hands.   PLAN:  1. 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 --I spoke with Dr. Pablo Ledger who will simulate him today and start treatment to the brain with planned completion around 03/25.  We will facilitate close follow-up to start chemotherapy with Etoposide/cisplatin. We will plan on starting chemotherapy on 03/26 with labs and a follow-up visit in 2 weeks on 03/23.   His chemotherapy for extensive stage small cell lung cancer wiill consist of the following:    Cisplatin 80 mg/m2 on day 1  Etoposide 100 mg/m2 days 1,2,3   For a maximum of 4-6 cycles every 21 days  We have referred him to chemotherapy teaching class and audiology.  We provided a detailed discussion  regarding the indications, benefits and risks of these chemotherapies.  The benefits include palliation of his current symptoms.  He is aware that his disease is not curable given evidence of metastases in the brain.  The risks associated with these chemotherapies include but is not limited to myelosuppression that may result in life threatening infections, nephrotoxicity, ototoxicity, peripheral neuropathy, nausea/vomiting, diarrhea or constipation, fatigue, decreased appetite.  He voiced understanding these side-effects, indications and benefits and he wished to proceed with chemotherapy following receipt of his WBI.  He is aware while of chemotherapy to report any temperatures greater than 100.5 to the oncologist-on-call and/or report to the nearest emergency room.  Prescriptions for his anti-emetics were provided.  2. Pain associated with #1. --Patient reported insomnia secondary to pain with deep breathing.  We provided him oxycodone 5 mg by mouth every 4 hours as need for severe pain.    3. PE (04/21/2013). --CTA of chest noted above consistent with Nonocclusive left upper lobe, right lower lobe pulmonary emboli. Right heart strain.  Continue xarelto 15 mg bid for 16 days and then xarelto 20  mg daily.   4. Hypertension. --He will continue lisinopril 10 mg daily.   5. Emphysema --Continue spiriva hanihaler 18 mcg inhalation daily  6. Follow-up.  --Patient will follow up in 2 weeks for labs and physical exam prior to planned chemotherapy on 3/26 for his disease.  He is scheduled to follow closely with radiation oncology for [facility].   All questions were answered. The patient knows to call the clinic with any problems, questions or concerns. We can certainly see the patient much sooner if necessary.  I spent 25 minutes counseling the patient face to face. The total time spent in the appointment was 40 minutes.    Bain Whichard, MD 05/03/2013 5:12 AM

## 2013-05-03 NOTE — Progress Notes (Signed)
  Radiation Oncology         ([phone]) [phone] ________________________________  Name: Ricky Villanueva MRN: [id number]  Date: 05/03/2013  DOB: [birth year]  Simulation Verification Note  Status: outpatient  NARRATIVE: The patient was brought to the treatment unit and placed in the planned treatment position. The clinical setup was verified. Then port films were obtained and uploaded to the radiation oncology medical record software.  The treatment beams were carefully compared against the planned radiation fields. The position location and shape of the radiation fields was reviewed. They targeted volume of tissue appears to be appropriately covered by the radiation beams. Organs at risk appear to be excluded as planned.  Based on my personal review, I approved the simulation verification. The patient's treatment will proceed as planned.  -----------------------------------  Blair Promise, PhD, MD

## 2013-05-03 NOTE — Progress Notes (Signed)
Faxed disability form to [employer] @ [phone]

## 2013-05-03 NOTE — Addendum Note (Signed)
Encounter addended by: Arlyss Repress, RN on: 05/03/2013  9:32 AM<BR>     Documentation filed: Charges VN

## 2013-05-03 NOTE — Addendum Note (Signed)
Encounter addended by: Deirdre Evener, RN on: 05/03/2013  9:34 AM<BR>     Documentation filed: Visit Diagnoses

## 2013-05-04 ENCOUNTER — Ambulatory Visit: Payer: BC Managed Care – PPO

## 2013-05-04 ENCOUNTER — Ambulatory Visit
Admission: RE | Admit: 2013-05-04 | Discharge: 2013-05-04 | Disposition: A | Discharge status: Home / Self Care | Payer: BC Managed Care – PPO | Source: Ambulatory Visit | Attending: Radiation Oncology | Admitting: Radiation Oncology

## 2013-05-04 DIAGNOSIS — C3490 Malignant neoplasm of unspecified part of unspecified bronchus or lung: Secondary | ICD-10-CM

## 2013-05-04 MED ORDER — BIAFINE EX EMUL
Freq: Two times a day (BID) | CUTANEOUS | Status: DC
  Administered 2013-05-04: 11:00:00 via TOPICAL

## 2013-05-04 NOTE — Progress Notes (Signed)
[facility] Psychosocial Distress Screening Clinical Social Work  Clinical Social Work was referred by distress screening protocol.  The patient scored a 5 on the Psychosocial Distress Thermometer which indicates moderate distress. Clinical Social Worker Intern telephoned to assess for distress and other psychosocial needs. Patient did not answer phone.  Message was left to return call if services were needed.   Clinical Social Worker follow up needed: no  If yes, follow up plan:   Ricky Villanueva S. [place] Work Intern [employer] [phone]

## 2013-05-04 NOTE — Progress Notes (Addendum)
Post sim ed completed w/patient, wife, daughter and son. Gave pt "Radiation and You" booklet w/all pertinent information marked and discussed, re : fatigue, hair loss/scalp care, nausea/vomiting, skin irritation of chest/care, throat irritation/care, nutrition, pain. Gave pt Biafine lotion w/instructions for proper use. Printed calendar for pt, family and reviewed. Scheduled nutrition appt for pt on 05/06/13. All questions answered. Pt and family verbalized understanding of education.

## 2013-05-05 ENCOUNTER — Encounter: Payer: Self-pay | Admitting: Radiation Oncology

## 2013-05-05 ENCOUNTER — Ambulatory Visit
Admission: RE | Admit: 2013-05-05 | Discharge: 2013-05-05 | Disposition: A | Discharge status: Home / Self Care | Payer: BC Managed Care – PPO | Source: Ambulatory Visit | Attending: Radiation Oncology | Admitting: Radiation Oncology

## 2013-05-05 ENCOUNTER — Ambulatory Visit: Payer: BC Managed Care – PPO

## 2013-05-05 VITALS — BP 99/77 | HR 109 | Temp 97.8°F | Wt 124.9 lb

## 2013-05-05 DIAGNOSIS — C3490 Malignant neoplasm of unspecified part of unspecified bronchus or lung: Secondary | ICD-10-CM

## 2013-05-05 NOTE — Progress Notes (Signed)
[place]     Rexene Edison, M.D. [place], [place] [address]               Blair Promise, M.D., Ph.D. Phone: [phone]      [place] A. Tammi Klippel, M.D. Fax: [phone]      Jodelle Gross, M.D., Ph.D.         [name], M.D.         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  Current Dose: 7.5 Gy   Current Fraction: 3  Planned Dose: 30 Gy directed at the brain and chest area  Narrative: Althea Charon was seen today for weekly treatment management. The chart was checked and port films  were reviewed. He is tolerating his treatments well thus far. He denies any nausea or headaches. He denies any difficulty with swallowing. He has chronic shortness of breath, worse lying down.  Augmentin Current Outpatient Prescriptions  Medication Sig Dispense Refill  . albuterol (PROVENTIL HFA;VENTOLIN HFA) 108 (90 BASE) MCG/ACT inhaler Inhale 2 puffs into the lungs every 6 (six) hours as needed for wheezing or shortness of breath.  1 Inhaler  2  . Cholecalciferol (VITAMIN D) 2000 UNITS CAPS Take 2,000 Units by mouth daily.       [name] emollient (BIAFINE) cream Apply topically 2 (two) times daily.      [name] KRILL OIL PO Take 1 tablet by mouth daily.      [name] lisinopril (PRINIVIL,ZESTRIL) 10 MG tablet Take 10 mg by mouth daily.       . pantoprazole (PROTONIX) 40 MG tablet Take 1 tablet (40 mg total) by mouth daily.  90 tablet  0  . ranitidine (ZANTAC) 300 MG capsule Take 1 capsule (300 mg total) by mouth daily.  90 capsule  0  . Rivaroxaban (XARELTO) 15 MG TABS tablet Take 1 tablet (15 mg total) by mouth 2 (two) times daily with a meal. X 19 days  38 tablet  0  . Rivaroxaban (XARELTO) 20 MG TABS  tablet Take 1 tablet (20 mg total) by mouth daily with supper. Start AFTER 15mg  dose is finished (05/19/13)  30 tablet  1  . tiotropium (SPIRIVA HANDIHALER) 18 MCG inhalation capsule Place 1 capsule (18 mcg total) into inhaler and inhale daily.  30 capsule  11  . oxyCODONE (OXY IR/ROXICODONE) 5 MG immediate release tablet Take 1 tablet (5 mg total) by mouth every 4 (four) hours as needed for severe pain.  60 tablet  0   No current facility-administered medications for this encounter.   Labs:  Lab Results  Component Value Date   WBC 10.8* 04/29/2013   HGB 11.1* 04/29/2013   HCT 32.8* 04/29/2013   MCV 93.7 04/29/2013   PLT 368 04/29/2013   Lab Results  Component Value Date   CREATININE 0.85 04/28/2013   BUN 9 04/28/2013   NA 140 04/28/2013   K 4.3 04/28/2013   CL 100 04/28/2013   CO2 26 04/28/2013   Lab Results  Component Value Date   ALT 10 04/22/2013   AST 14 04/22/2013  PHOS 2.3 04/21/2013   BILITOT 0.3 04/22/2013    Physical Examination:  Filed Vitals:   05/05/13 1037  BP: 99/77  Pulse: 109  Temp: 97.8 F (36.6 C)    Wt Readings from Last 3 Encounters:  05/05/13 124 lb 14.4 oz (56.654 kg)  05/02/13 125 lb 11.2 oz (57.017 kg)  04/29/13 128 lb 8.5 oz (58.3 kg)     Lungs - Normal respiratory effort, chest expands symmetrically. Lungs are clear to auscultation, no crackles or wheezes.  Heart has regular rhythm and rate  Abdomen is soft and non tender with normal bowel sounds  Assessment:  Patient tolerating treatments well  Plan: Continue treatment per original radiation prescription

## 2013-05-05 NOTE — Progress Notes (Signed)
Patient, wife and daughter here for weekly assessment of radiation to brain and chest.Completed 3 of 12 treatment to both areas.denies pain.Has shortness of breath at all times but greater on lying down.Patient to start chemotherapy on last day of radiation.No questions today.

## 2013-05-06 ENCOUNTER — Ambulatory Visit: Payer: BC Managed Care – PPO | Admitting: Nutrition

## 2013-05-06 ENCOUNTER — Ambulatory Visit
Admission: RE | Admit: 2013-05-06 | Discharge: 2013-05-06 | Disposition: A | Discharge status: Home / Self Care | Payer: BC Managed Care – PPO | Source: Ambulatory Visit | Attending: Radiation Oncology | Admitting: Radiation Oncology

## 2013-05-06 ENCOUNTER — Ambulatory Visit: Payer: BC Managed Care – PPO

## 2013-05-06 NOTE — Addendum Note (Signed)
Encounter addended by: Rebecca Eaton, RN on: 05/06/2013  9:40 AM<BR>     Documentation filed: Charges VN

## 2013-05-06 NOTE — Progress Notes (Signed)
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  He is a patient of Dr. Juliann Mule and  Dr.Wentworth.  Past medical history includes hyperlipidemia, hypertension, GERD, respiratory failure with hypoxia, PE, COPD.  Medications include vitamin D, Protonix, Zantac, and Xarelto.  Labs were reviewed.  Height: 67 inches. Weight: 124.9 pounds March 12. Usual body weight: 140 pounds May 2014. BMI: 19.56.  Patient reports poor appetite, decreased oral intake and taste alterations.  Patient states he's never been a big man.  He is eating small amounts of food throughout the day.  Patient's wife and daughter are anxious about patient eating.  Nutrition diagnosis: Unintended weight loss related to poor appetite and inadequate oral intake as evidenced by 11% weight loss from usual body weight.  Intervention: Patient was educated to try to eat small amounts of food frequently throughout the day.  Educated patient on the importance of adding oral nutrition supplements as tolerated.  Suggested patient may want to drink 4 ounces a time.  Provided fact sheets on strategies for improving taste and increasing oral intake.  Provided oral nutrition supplement samples.  Questions were answered.  Teach back method used.  Monitoring, evaluation, goals: Patient will be able to increase his oral intake for improved quality-of-life.  Next visit: Wednesday, March 25, during chemotherapy.

## 2013-05-09 ENCOUNTER — Ambulatory Visit: Payer: BC Managed Care – PPO

## 2013-05-09 ENCOUNTER — Ambulatory Visit
Admission: RE | Admit: 2013-05-09 | Discharge: 2013-05-09 | Disposition: A | Discharge status: Home / Self Care | Payer: BC Managed Care – PPO | Source: Ambulatory Visit | Attending: Radiation Oncology | Admitting: Radiation Oncology

## 2013-05-09 ENCOUNTER — Other Ambulatory Visit: Payer: BC Managed Care – PPO

## 2013-05-10 ENCOUNTER — Other Ambulatory Visit: Payer: BC Managed Care – PPO

## 2013-05-10 ENCOUNTER — Ambulatory Visit: Payer: BC Managed Care – PPO

## 2013-05-10 ENCOUNTER — Ambulatory Visit: Payer: BC Managed Care – PPO | Admitting: Radiation Oncology

## 2013-05-10 ENCOUNTER — Ambulatory Visit
Admission: RE | Admit: 2013-05-10 | Discharge: 2013-05-10 | Disposition: A | Discharge status: Home / Self Care | Payer: BC Managed Care – PPO | Source: Ambulatory Visit | Attending: Radiation Oncology | Admitting: Radiation Oncology

## 2013-05-10 VITALS — BP 105/61 | HR 88 | Temp 97.5°F | Wt 123.5 lb

## 2013-05-10 DIAGNOSIS — C3490 Malignant neoplasm of unspecified part of unspecified bronchus or lung: Secondary | ICD-10-CM

## 2013-05-10 MED ORDER — BIAFINE EX EMUL
CUTANEOUS | Status: DC | PRN
  Administered 2013-05-10: 12:00:00 via TOPICAL

## 2013-05-10 NOTE — Progress Notes (Signed)
Weekly Management Note Current Dose:15 Gy  Projected Dose:30 Gy   Narrative:  The patient presents for routine under treatment assessment.  CBCT/MVCT images/Port film x-rays were reviewed.  The chart was checked. Doing well. No complaints except decreased energy. BP "low" at home 114/70s. On lisonopril. More active yesterday. Eating more over the weekend.  Breathing stable to improved slightly. On O2.  Physical Findings:  Unchanged  Vitals:  Filed Vitals:   05/10/13 1023  BP: 105/61  Pulse: 88  Temp: 97.5 F (36.4 C)   Weight:  Wt Readings from Last 3 Encounters:  05/10/13 123 lb 8 oz (56.019 kg)  05/05/13 124 lb 14.4 oz (56.654 kg)  05/02/13 125 lb 11.2 oz (57.017 kg)   Lab Results  Component Value Date   WBC 10.8* 04/29/2013   HGB 11.1* 04/29/2013   HCT 32.8* 04/29/2013   MCV 93.7 04/29/2013   PLT 368 04/29/2013   Lab Results  Component Value Date   CREATININE 0.85 04/28/2013   BUN 9 04/28/2013   NA 140 04/28/2013   K 4.3 04/28/2013   CL 100 04/28/2013   CO2 26 04/28/2013     Impression:  The patient is tolerating radiation.  Plan:  Continue treatment as planned. Start biafene.

## 2013-05-10 NOTE — Addendum Note (Signed)
Encounter addended by: Arlyss Repress, RN on: 05/10/2013 11:37 AM<BR>     Documentation filed: Orders

## 2013-05-10 NOTE — Addendum Note (Signed)
Encounter addended by: Arlyss Repress, RN on: 05/10/2013 11:55 AM<BR>     Documentation filed: Inpatient MAR

## 2013-05-10 NOTE — Progress Notes (Signed)
Patient here for weekly assessment of radiation to whole brain and chest.Completed 6 of 12 treatments.Denies pain.No change in shortness of breath.Occasional productive cough.states he is eating better but he has lost 1/2 lb in last week.

## 2013-05-11 ENCOUNTER — Ambulatory Visit
Admission: RE | Admit: 2013-05-11 | Discharge: 2013-05-11 | Disposition: A | Discharge status: Home / Self Care | Payer: BC Managed Care – PPO | Source: Ambulatory Visit | Attending: Radiation Oncology | Admitting: Radiation Oncology

## 2013-05-12 ENCOUNTER — Ambulatory Visit
Admission: RE | Admit: 2013-05-12 | Discharge: 2013-05-12 | Disposition: A | Discharge status: Home / Self Care | Payer: BC Managed Care – PPO | Source: Ambulatory Visit | Attending: Radiation Oncology | Admitting: Radiation Oncology

## 2013-05-13 ENCOUNTER — Ambulatory Visit
Admission: RE | Admit: 2013-05-13 | Discharge: 2013-05-13 | Disposition: A | Discharge status: Home / Self Care | Payer: BC Managed Care – PPO | Source: Ambulatory Visit | Attending: Radiation Oncology | Admitting: Radiation Oncology

## 2013-05-13 DIAGNOSIS — C3490 Malignant neoplasm of unspecified part of unspecified bronchus or lung: Secondary | ICD-10-CM

## 2013-05-13 NOTE — Progress Notes (Signed)
Cancer claim paperwork completed, copied.Original given to patient's wife and copy given to [facility] (medical records)to scan in.

## 2013-05-16 ENCOUNTER — Encounter (INDEPENDENT_AMBULATORY_CARE_PROVIDER_SITE_OTHER): Payer: Self-pay

## 2013-05-16 ENCOUNTER — Other Ambulatory Visit (HOSPITAL_BASED_OUTPATIENT_CLINIC_OR_DEPARTMENT_OTHER): Payer: BC Managed Care – PPO

## 2013-05-16 ENCOUNTER — Ambulatory Visit: Payer: BC Managed Care – PPO

## 2013-05-16 ENCOUNTER — Ambulatory Visit (HOSPITAL_BASED_OUTPATIENT_CLINIC_OR_DEPARTMENT_OTHER): Payer: BC Managed Care – PPO | Admitting: Internal Medicine

## 2013-05-16 ENCOUNTER — Ambulatory Visit
Admission: RE | Admit: 2013-05-16 | Discharge: 2013-05-16 | Disposition: A | Discharge status: Home / Self Care | Payer: BC Managed Care – PPO | Source: Ambulatory Visit | Attending: Radiation Oncology | Admitting: Radiation Oncology

## 2013-05-16 VITALS — BP 104/71 | HR 96 | Temp 97.1°F | Resp 18 | Ht 67.0 in | Wt 118.3 lb

## 2013-05-16 DIAGNOSIS — C7949 Secondary malignant neoplasm of other parts of nervous system: Secondary | ICD-10-CM

## 2013-05-16 DIAGNOSIS — I10 Essential (primary) hypertension: Secondary | ICD-10-CM

## 2013-05-16 DIAGNOSIS — R5383 Other fatigue: Secondary | ICD-10-CM

## 2013-05-16 DIAGNOSIS — G4700 Insomnia, unspecified: Secondary | ICD-10-CM

## 2013-05-16 DIAGNOSIS — R5381 Other malaise: Secondary | ICD-10-CM

## 2013-05-16 DIAGNOSIS — R634 Abnormal weight loss: Secondary | ICD-10-CM

## 2013-05-16 DIAGNOSIS — C3490 Malignant neoplasm of unspecified part of unspecified bronchus or lung: Secondary | ICD-10-CM

## 2013-05-16 DIAGNOSIS — C3430 Malignant neoplasm of lower lobe, unspecified bronchus or lung: Secondary | ICD-10-CM

## 2013-05-16 DIAGNOSIS — C7931 Secondary malignant neoplasm of brain: Secondary | ICD-10-CM

## 2013-05-16 DIAGNOSIS — J438 Other emphysema: Secondary | ICD-10-CM

## 2013-05-16 DIAGNOSIS — G893 Neoplasm related pain (acute) (chronic): Secondary | ICD-10-CM

## 2013-05-16 DIAGNOSIS — I2699 Other pulmonary embolism without acute cor pulmonale: Secondary | ICD-10-CM

## 2013-05-16 LAB — COMPREHENSIVE METABOLIC PANEL (CC13)
ALK PHOS: 91 U/L (ref 40–150)
ALT: 17 U/L (ref 0–55)
ANION GAP: 14 meq/L — AB (ref 3–11)
AST: 19 U/L (ref 5–34)
Albumin: 3.2 g/dL — ABNORMAL LOW (ref 3.5–5.0)
BILIRUBIN TOTAL: 0.33 mg/dL (ref 0.20–1.20)
BUN: 17.7 mg/dL (ref 7.0–26.0)
CO2: 25 meq/L (ref 22–29)
Calcium: 10.4 mg/dL (ref 8.4–10.4)
Chloride: 101 mEq/L (ref 98–109)
Creatinine: 1 mg/dL (ref 0.7–1.3)
Glucose: 110 mg/dl (ref 70–140)
Potassium: 5.1 mEq/L (ref 3.5–5.1)
SODIUM: 140 meq/L (ref 136–145)
TOTAL PROTEIN: 8.1 g/dL (ref 6.4–8.3)

## 2013-05-16 LAB — LACTATE DEHYDROGENASE (CC13): LDH: 516 U/L — AB (ref 125–245)

## 2013-05-16 LAB — CBC WITH DIFFERENTIAL/PLATELET
BASO%: 0.6 % (ref 0.0–2.0)
Basophils Absolute: 0.1 10*3/uL (ref 0.0–0.1)
EOS ABS: 0.3 10*3/uL (ref 0.0–0.5)
EOS%: 3 % (ref 0.0–7.0)
HEMATOCRIT: 41 % (ref 38.4–49.9)
HEMOGLOBIN: 13.4 g/dL (ref 13.0–17.1)
LYMPH%: 8.2 % — ABNORMAL LOW (ref 14.0–49.0)
MCH: 30.5 pg (ref 27.2–33.4)
MCHC: 32.7 g/dL (ref 32.0–36.0)
MCV: 93.3 fL (ref 79.3–98.0)
MONO#: 0.8 10*3/uL (ref 0.1–0.9)
MONO%: 8.3 % (ref 0.0–14.0)
NEUT%: 79.9 % — AB (ref 39.0–75.0)
NEUTROS ABS: 7.9 10*3/uL — AB (ref 1.5–6.5)
PLATELETS: 566 10*3/uL — AB (ref 140–400)
RBC: 4.39 10*6/uL (ref 4.20–5.82)
RDW: 12.4 % (ref 11.0–14.6)
WBC: 9.9 10*3/uL (ref 4.0–10.3)
lymph#: 0.8 10*3/uL — ABNORMAL LOW (ref 0.9–3.3)

## 2013-05-16 NOTE — Patient Instructions (Signed)
Cisplatin injection What is this medicine? CISPLATIN (SIS pla tin) is a chemotherapy drug. It targets fast dividing cells, like cancer cells, and causes these cells to die. This medicine is used to treat many types of cancer like bladder, ovarian, and testicular cancers. This medicine may be used for other purposes; ask your health care provider or pharmacist if you have questions. COMMON BRAND NAME(S): Platinol -AQ, Platinol What should I tell my health care provider before I take this medicine? They need to know if you have any of these conditions: -blood disorders -hearing problems -kidney disease -recent or ongoing radiation therapy -an unusual or allergic reaction to cisplatin, carboplatin, other chemotherapy, other medicines, foods, dyes, or preservatives -pregnant or trying to get pregnant -breast-feeding How should I use this medicine? This drug is given as an infusion into a vein. It is administered in a hospital or clinic by a specially trained health care professional. Talk to your pediatrician regarding the use of this medicine in children. Special care may be needed. Overdosage: If you think you have taken too much of this medicine contact a poison control center or emergency room at once. NOTE: This medicine is only for you. Do not share this medicine with others. What if I miss a dose? It is important not to miss a dose. Call your doctor or health care professional if you are unable to keep an appointment. What may interact with this medicine? -dofetilide -foscarnet -medicines for seizures -medicines to increase blood counts like filgrastim, pegfilgrastim, sargramostim -probenecid -pyridoxine used with altretamine -rituximab -some antibiotics like amikacin, gentamicin, neomycin, polymyxin B, streptomycin, tobramycin -sulfinpyrazone -vaccines -zalcitabine Talk to your doctor or health care professional before taking any of these  medicines: -acetaminophen -aspirin -ibuprofen -ketoprofen -naproxen This list may not describe all possible interactions. Give your health care provider a list of all the medicines, herbs, non-prescription drugs, or dietary supplements you use. Also tell them if you smoke, drink alcohol, or use illegal drugs. Some items may interact with your medicine. What should I watch for while using this medicine? Your condition will be monitored carefully while you are receiving this medicine. You will need important blood work done while you are taking this medicine. This drug may make you feel generally unwell. This is not uncommon, as chemotherapy can affect healthy cells as well as cancer cells. Report any side effects. Continue your course of treatment even though you feel ill unless your doctor tells you to stop. In some cases, you may be given additional medicines to help with side effects. Follow all directions for their use. Call your doctor or health care professional for advice if you get a fever, chills or sore throat, or other symptoms of a cold or flu. Do not treat yourself. This drug decreases your body's ability to fight infections. Try to avoid being around people who are sick. This medicine may increase your risk to bruise or bleed. Call your doctor or health care professional if you notice any unusual bleeding. Be careful brushing and flossing your teeth or using a toothpick because you may get an infection or bleed more easily. If you have any dental work done, tell your dentist you are receiving this medicine. Avoid taking products that contain aspirin, acetaminophen, ibuprofen, naproxen, or ketoprofen unless instructed by your doctor. These medicines may hide a fever. Do not become pregnant while taking this medicine. Women should inform their doctor if they wish to become pregnant or think they might be pregnant. There is a  potential for serious side effects to an unborn child. Talk to  your health care professional or pharmacist for more information. Do not breast-feed an infant while taking this medicine. Drink fluids as directed while you are taking this medicine. This will help protect your kidneys. Call your doctor or health care professional if you get diarrhea. Do not treat yourself. What side effects may I notice from receiving this medicine? Side effects that you should report to your doctor or health care professional as soon as possible: -allergic reactions like skin rash, itching or hives, swelling of the face, lips, or tongue -signs of infection - fever or chills, cough, sore throat, pain or difficulty passing urine -signs of decreased platelets or bleeding - bruising, pinpoint red spots on the skin, black, tarry stools, nosebleeds -signs of decreased red blood cells - unusually weak or tired, fainting spells, lightheadedness -breathing problems -changes in hearing -gout pain -low blood counts - This drug may decrease the number of white blood cells, red blood cells and platelets. You may be at increased risk for infections and bleeding. -nausea and vomiting -pain, swelling, redness or irritation at the injection site -pain, tingling, numbness in the hands or feet -problems with balance, movement -trouble passing urine or change in the amount of urine Side effects that usually do not require medical attention (report to your doctor or health care professional if they continue or are bothersome): -changes in vision -loss of appetite -metallic taste in the mouth or changes in taste This list may not describe all possible side effects. Call your doctor for medical advice about side effects. You may report side effects to FDA at 1-800-FDA-1088. Where should I keep my medicine? This drug is given in a hospital or clinic and will not be stored at home. NOTE: This sheet is a summary. It may not cover all possible information. If you have questions about this medicine,  talk to your doctor, pharmacist, or health care provider.  2014, Elsevier/Gold Standard. (2007-05-18 14:40:54) Etoposide, VP-16 injection What is this medicine? ETOPOSIDE, VP-16 (e toe POE side) is a chemotherapy drug. It is used to treat testicular cancer, lung cancer, and other cancers. This medicine may be used for other purposes; ask your health care provider or pharmacist if you have questions. COMMON BRAND NAME(S): Etopophos, Toposar, VePesid What should I tell my health care provider before I take this medicine? They need to know if you have any of these conditions: -infection -kidney disease -low blood counts, like low white cell, platelet, or red cell counts -an unusual or allergic reaction to etoposide, other chemotherapeutic agents, other medicines, foods, dyes, or preservatives -pregnant or trying to get pregnant -breast-feeding How should I use this medicine? This medicine is for infusion into a vein. It is administered in a hospital or clinic by a specially trained health care professional. Talk to your pediatrician regarding the use of this medicine in children. Special care may be needed. Overdosage: If you think you have taken too much of this medicine contact a poison control center or emergency room at once. NOTE: This medicine is only for you. Do not share this medicine with others. What if I miss a dose? It is important not to miss your dose. Call your doctor or health care professional if you are unable to keep an appointment. What may interact with this medicine? -cyclosporine -medicines to increase blood counts like filgrastim, pegfilgrastim, sargramostim -vaccines This list may not describe all possible interactions. Give your health care provider  a list of all the medicines, herbs, non-prescription drugs, or dietary supplements you use. Also tell them if you smoke, drink alcohol, or use illegal drugs. Some items may interact with your medicine. What should I  watch for while using this medicine? Visit your doctor for checks on your progress. This drug may make you feel generally unwell. This is not uncommon, as chemotherapy can affect healthy cells as well as cancer cells. Report any side effects. Continue your course of treatment even though you feel ill unless your doctor tells you to stop. In some cases, you may be given additional medicines to help with side effects. Follow all directions for their use. Call your doctor or health care professional for advice if you get a fever, chills or sore throat, or other symptoms of a cold or flu. Do not treat yourself. This drug decreases your body's ability to fight infections. Try to avoid being around people who are sick. This medicine may increase your risk to bruise or bleed. Call your doctor or health care professional if you notice any unusual bleeding. Be careful brushing and flossing your teeth or using a toothpick because you may get an infection or bleed more easily. If you have any dental work done, tell your dentist you are receiving this medicine. Avoid taking products that contain aspirin, acetaminophen, ibuprofen, naproxen, or ketoprofen unless instructed by your doctor. These medicines may hide a fever. Do not become pregnant while taking this medicine. Women should inform their doctor if they wish to become pregnant or think they might be pregnant. There is a potential for serious side effects to an unborn child. Talk to your health care professional or pharmacist for more information. Do not breast-feed an infant while taking this medicine. What side effects may I notice from receiving this medicine? Side effects that you should report to your doctor or health care professional as soon as possible: -allergic reactions like skin rash, itching or hives, swelling of the face, lips, or tongue -low blood counts - this medicine may decrease the number of white blood cells, red blood cells and platelets.  You may be at increased risk for infections and bleeding. -signs of infection - fever or chills, cough, sore throat, pain or difficulty passing urine -signs of decreased platelets or bleeding - bruising, pinpoint red spots on the skin, black, tarry stools, blood in the urine -signs of decreased red blood cells - unusually weak or tired, fainting spells, lightheadedness -breathing problems -changes in vision -mouth or throat sores or ulcers -pain, redness, swelling or irritation at the injection site -pain, tingling, numbness in the hands or feet -redness, blistering, peeling or loosening of the skin, including inside the mouth -seizures -vomiting Side effects that usually do not require medical attention (report to your doctor or health care professional if they continue or are bothersome): -diarrhea -hair loss -loss of appetite -nausea -stomach pain This list may not describe all possible side effects. Call your doctor for medical advice about side effects. You may report side effects to FDA at 1-800-FDA-1088. Where should I keep my medicine? This drug is given in a hospital or clinic and will not be stored at home. NOTE: This sheet is a summary. It may not cover all possible information. If you have questions about this medicine, talk to your doctor, pharmacist, or health care provider.  2014, Elsevier/Gold Standard. (2007-06-14 17:24:12)

## 2013-05-17 ENCOUNTER — Ambulatory Visit
Admission: RE | Admit: 2013-05-17 | Discharge: 2013-05-17 | Disposition: A | Discharge status: Home / Self Care | Payer: BC Managed Care – PPO | Source: Ambulatory Visit | Attending: Radiation Oncology | Admitting: Radiation Oncology

## 2013-05-17 ENCOUNTER — Ambulatory Visit: Payer: BC Managed Care – PPO

## 2013-05-17 ENCOUNTER — Other Ambulatory Visit: Payer: Self-pay | Admitting: *Deleted

## 2013-05-17 ENCOUNTER — Telehealth: Payer: Self-pay | Admitting: Family Medicine

## 2013-05-17 VITALS — BP 92/57 | HR 99 | Temp 97.7°F | Resp 20 | Wt 119.9 lb

## 2013-05-17 DIAGNOSIS — C3490 Malignant neoplasm of unspecified part of unspecified bronchus or lung: Secondary | ICD-10-CM

## 2013-05-17 MED ORDER — LISINOPRIL 10 MG PO TABS: 10.0000 mg | ORAL_TABLET | Freq: Every day | ORAL | Status: DC

## 2013-05-17 NOTE — Addendum Note (Signed)
Encounter addended by: Rebecca Eaton, RN on: 05/17/2013  8:31 AM<BR>     Documentation filed: Charges VN

## 2013-05-17 NOTE — Telephone Encounter (Signed)
Completed by Dr Jacelyn Grip

## 2013-05-17 NOTE — Progress Notes (Signed)
[facility] OFFICE PROGRESS NOTE  [name], [place] [id number]  DIAGNOSIS: Small cell carcinoma of lung - Plan: CBC with Differential, Comprehensive metabolic panel (Cmet) - [facility], Lactate dehydrogenase (LDH) - [facility], Basic metabolic panel (Bmet) - [facility], Basic metabolic panel (Bmet) - [facility], Basic metabolic panel (Bmet) - [facility], CBC with Differential, Basic metabolic panel (Bmet) - [facility]  Chief Complaint  Patient presents with  . Small cell carcinoma of lung    CURRENT TREATMENT: WB XRT started on 03/10 - 03/25 per Dr. Pablo Ledger.  Planning to start cisplatin plus etoposide on 03/25 as detailed below.   INTERVAL HISTORY: Ricky Villanueva 63 y.o. 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  He was last seen by me on 05/02/2013.   His past medical history also includes HTN/Hyperlipidemia and longstanding smoking history but quit 12/25/2011 presented to ER on 02/26 with worsening dyspnea, cough with streaks of blood over the past one week. In the ED, his oxygen saturation was 91% on 2 L nasal canula. CXR revealed a progressive increased density in the right lower lobe consistent with worsening atelectasis or pneumonia and a soft tissue fullness in the hilar region suspicious for lymphadenopathy or mass.and follow-up CT of chest was recommended. It revealed a nonocclusive left upper lobe, right lower lobe pulmonary emboli with right heart strain. Bulky mediastinal and right hilar lymphadenopathy, encasing the of pulmonary arteries, pulmonary veins and bronchi, completely effacing right lower lobe segmental bronchi. Extensive consolidation in the right lung suggested postobstructive pneumonia, with interstitial prominence which may reflect superimposed lymphangitic spread of infection or neoplasm. He was admitted and started on antibiotics and heparin. Pulmonary was consulted and he had  a video bronchoscopy on 03/03 with consistent with small cell ca of lung. 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   Today, patient is accompanied by his wife [name], daughter and grandson.  He also has a scheduled appointment with Dr. Pablo Ledger later today. He is on 2 Liters of oxygen by nasal cannula.  He reports fatigue and decreased appetite and weight lost.  He denies further hemoptysis.   MEDICAL HISTORY: Past Medical History  Diagnosis Date  . Hyperlipidemia   . Hypertension   . GERD (gastroesophageal reflux disease)   . Respiratory failure with hypoxia 04/21/2013    secondary to pneumonia/notes 04/21/2013  . Pulmonary embolism     "got one in there now" (04/21/2013)  . COPD (chronic obstructive pulmonary disease)   . Pneumonia 2/?01/2014    "wouldn't get better; hospitalized 04/21/2013)  . Arthritis     "minor in my right hand" (04/21/2013)    INTERIM HISTORY: has GERD (gastroesophageal reflux disease); HCAP (healthcare-associated pneumonia); Respiratory failure with hypoxia; HTN (hypertension); Acute pulmonary embolism; Lung mass; Emphysema lung; and Small cell carcinoma of lung on his problem list.    ALLERGIES:  is allergic to augmentin.  MEDICATIONS: has a current medication list which includes the following prescription(s): albuterol, vitamin d, emollient, krill oil, lisinopril, oxycodone, pantoprazole, ranitidine, rivaroxaban, tiotropium, and rivaroxaban.  SURGICAL HISTORY:  Past Surgical History  Procedure Laterality Date  . Inguinal hernia repair Right ~ 2005  . Finger surgery Left ~ 1989    "crushed end of my middle finger off"   . Video  bronchoscopy Bilateral 04/26/2013    Procedure: VIDEO BRONCHOSCOPY WITH FLUORO;  Surgeon: Wilhelmina Mcardle, MD;  Location: [facility] ENDOSCOPY;  Service: Cardiopulmonary;  Laterality: Bilateral;    REVIEW OF SYSTEMS:    Constitutional: Denies fevers, chills or abnormal weight loss Eyes: Denies blurriness of vision Ears, nose, mouth, throat, and face: Denies mucositis or sore throat Respiratory: He reports cough, dyspnea  But denies wheezes Cardiovascular: Denies palpitation, chest discomfort or lower extremity swelling Gastrointestinal:  Denies nausea, heartburn or change in bowel habits Skin: Denies abnormal skin rashes Lymphatics: Denies new lymphadenopathy or easy bruising Neurological:Denies numbness, tingling or new weaknesses Behavioral/Psych: Mood is stable, no new changes  All other systems were reviewed with the patient and are negative.  PHYSICAL EXAMINATION: ECOG PERFORMANCE STATUS: 1 - Symptomatic but completely ambulatory  Blood pressure 104/71, pulse 96, temperature 97.1 F (36.2 C), temperature source Oral, resp. rate 18, height 5\' 7"  (1.702 m), weight 118 lb 4.8 oz (53.661 kg), SpO2 96.00%.  GENERAL:alert, no distress and comfortable, thin chronically ill appearing gentleman SKIN: skin color, texture, turgor are normal, no rashes or significant lesions EYES: normal, Conjunctiva are pink and non-injected, sclera clear OROPHARYNX:no exudate, no erythema and lips, buccal mucosa, and tongue normal  NECK: supple, thyroid normal size, non-tender, without nodularity LYMPH:  no palpable lymphadenopathy in the cervical, axillary or supraclavicular LUNGS: clear to auscultation with normal breathing effort, no wheezes or rhonchi on the left. R lung with decreased breath sound at the bases.  HEART: regular rate & rhythm and no murmurs and no lower extremity edema ABDOMEN:abdomen soft, non-tender and normal bowel sounds Musculoskeletal:no cyanosis of digits and no clubbing  NEURO: alert & oriented x 3 with fluent speech, no focal motor/sensory deficits  Labs:  Lab Results  Component Value Date   WBC 9.9 05/16/2013   HGB 13.4 05/16/2013   HCT 41.0 05/16/2013   MCV 93.3 05/16/2013   PLT 566*  05/16/2013   NEUTROABS 7.9* 05/16/2013      Chemistry      Component Value Date/Time   NA 140 05/16/2013 1519   NA 140 04/28/2013 0505   K 5.1 05/16/2013 1519   K 4.3 04/28/2013 0505   CL 100 04/28/2013 0505   CO2 25 05/16/2013 1519   CO2 26 04/28/2013 0505   BUN 17.7 05/16/2013 1519   BUN 9 04/28/2013 0505   CREATININE 1.0 05/16/2013 1519   CREATININE 0.85 04/28/2013 0505      Component Value Date/Time   CALCIUM 10.4 05/16/2013 1519   CALCIUM 9.4 04/28/2013 0505   ALKPHOS 91 05/16/2013 1519   ALKPHOS 70 04/22/2013 0406   AST 19 05/16/2013 1519   AST 14 04/22/2013 0406   ALT 17 05/16/2013 1519   ALT 10 04/22/2013 0406   BILITOT 0.33 05/16/2013 1519   BILITOT 0.3 04/22/2013 0406       Basic Metabolic Panel:  Recent Labs Lab 05/16/13 1519  NA 140  K 5.1  CO2 25  GLUCOSE 110  BUN 17.7  CREATININE 1.0  CALCIUM 10.4   GFR Estimated Creatinine Clearance: 58.9 ml/min (by C-G formula based on Cr of 1). Liver Function Tests:  CBC:  Recent Labs Lab 05/16/13 1519  WBC 9.9  NEUTROABS 7.9*  HGB 13.4  HCT 41.0  MCV 93.3  PLT 566*    Studies:  No results found.   RADIOGRAPHIC STUDIES: None  PATHOLOGY: Diagnosis 04/26/2013 Bronchus, biopsy, RLL - POORLY DIFFERENTIATED SMALL CELL NEUROENDOCRINE CARCINOMA. Microscopic Comment The biopsies are extensively involved  by an atypical infiltrate with histologic features of small cell carcinoma. Immunohistochemistry is performed and the tumor is positive for CD56, Chromogranin, Synaptophysin, Thyroid Transcription Factor - 1, Cytokeratin 7 and Cytokeratin AE1 / AE3. Tumor is negative for Cytokeratin 56 and mostly negative with p63. The histologic and immunophenotypic characteristics are consistent with poorly differentiated small cell neuroendocrine carcinoma. Case discussed with Dr. Alva Garnet on 04/27/13. (JDP:caf 04/28/13) Ricky Laws MD Pathologist, Electronic Signature (Case signed 04/28/2013) Specimen Gross and Clinical  Information Specimen(s) Obtained: Bronchus, biopsy, RLL  ASSESSMENT: Althea Charon 63 y.o. male with a history of Small cell carcinoma of lung - Plan: CBC with Differential, Comprehensive metabolic panel (Cmet) - [facility], Lactate dehydrogenase (LDH) - [facility], Basic metabolic panel (Bmet) - [facility], Basic metabolic panel (Bmet) - [facility], Basic metabolic panel (Bmet) - [facility], CBC with Differential, Basic metabolic panel (Bmet) - [facility]  63 yo male with newly diagnosed Small cell carcinoma of the R lung complicated by obstructive pneumonia, PE. ECOG 1. + weight loss.   We reviewed his images and cytology results consistent with the above.  We reviewed some general prognostic indicators include functional status. He denies hearing problems or Kidney problems or numbness or tingling of feet or hands.   PLAN:  1. 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 --Dr. Pablo Ledger who will simulate him today and start treatment to the brain with planned completion around 03/25.  We will plan on starting chemotherapy on 03/25 with labs and a follow-up visit in 2 weeks on 04/08.   His chemotherapy for extensive stage small cell lung cancer wiill consist of the following:    Cisplatin 80 mg/m2 on day 1  Etoposide 100 mg/m2 days 1,2,3   For a maximum of 4-6 cycles every 21 days  We provided a detailed discussion regarding the indications, benefits and risks of these chemotherapies.  The benefits include palliation of his current symptoms.  He is aware that his disease is not curable given evidence of metastases in the brain.  The risks associated with these chemotherapies include but is not limited to myelosuppression that may result in life threatening infections, nephrotoxicity, ototoxicity, peripheral neuropathy, nausea/vomiting, diarrhea or constipation, fatigue, decreased appetite.  He voiced understanding these side-effects, indications and benefits and he wished to  proceed with chemotherapy.  He is aware while of chemotherapy to report any temperatures greater than 100.5 to the oncologist-on-call and/or report to the nearest emergency room.  Prescriptions for his anti-emetics were provided.  He has seen chemotherapy teaching class. He awaits for audiology appointment.   2. Pain associated with #1, improved. --Patient reported insomnia secondary to pain with deep breathing.  We provided him oxycodone 5 mg by mouth every 4 hours as need for severe pain. He reports improvement and now uses these as needed.   3. PE (04/21/2013). --CTA of chest noted above consistent with Nonocclusive left upper lobe, right lower lobe pulmonary emboli. Right heart strain.  Continue xarelto 15 mg bid for 16 days and then xarelto 20 mg daily.   4. Hypertension. --He will continue lisinopril 10 mg daily.   5. Emphysema --Continue spiriva hanihaler 18 mcg inhalation daily. Oxygen 2 liters by [place]  6. Follow-up.  --Patient will follow up in 2 weeks for labs and physical exam prior to planned chemotherapy on 3/25 for his disease. Labs in one check to check his count nadir.     All questions were answered. The patient knows to call  the clinic with any problems, questions or concerns. We can certainly see the patient much sooner if necessary.  I spent 25 minutes counseling the patient face to face. The total time spent in the appointment was 40 minutes.    Even Budlong, MD 05/17/2013 8:23 AM

## 2013-05-17 NOTE — Progress Notes (Signed)
Patient for weekly assessment of radiation to chest and brain.Denies pain.Had some nausea resolved with mints.To start chemotherapy tomorrow.Had to review how to take medications (anti-emetics) post chemotherapy.Has some mild redness and soreness of scalp and back.Applying biafine.

## 2013-05-17 NOTE — Progress Notes (Signed)
Weekly Management Note Current Dose: 27.5 Gy  Projected Dose:30 Gy   Narrative:  The patient presents for routine under treatment assessment.  CBCT/MVCT images/Port film x-rays were reviewed.  The chart was checked. Doing well. Some nausea but controlled. Decreased appetite and taste. Starts chemo tomorrow. Not on steroids. Breathing better than when he was in the hospital but still sleeps with head elevated.  Physical Findings:  Unchanged. No oral thrush.   Vitals:  Filed Vitals:   05/17/13 1022  BP: 92/57  Pulse: 99  Temp: 97.7 F (36.5 C)  Resp: 20   Weight:  Wt Readings from Last 3 Encounters:  05/17/13 119 lb 14.4 oz (54.386 kg)  05/16/13 118 lb 4.8 oz (53.661 kg)  05/10/13 123 lb 8 oz (56.019 kg)   Lab Results  Component Value Date   WBC 9.9 05/16/2013   HGB 13.4 05/16/2013   HCT 41.0 05/16/2013   MCV 93.3 05/16/2013   PLT 566* 05/16/2013   Lab Results  Component Value Date   CREATININE 1.0 05/16/2013   BUN 17.7 05/16/2013   NA 140 05/16/2013   K 5.1 05/16/2013   CL 100 04/28/2013   CO2 25 05/16/2013     Impression:  The patient is tolerating radiation.  Plan:  Continue treatment as planned. Start chemo. Follow up in 1 month.

## 2013-05-17 NOTE — Telephone Encounter (Signed)
This rx was okay by Dr. Jacelyn Grip

## 2013-05-18 ENCOUNTER — Ambulatory Visit: Payer: BC Managed Care – PPO

## 2013-05-18 ENCOUNTER — Other Ambulatory Visit (HOSPITAL_BASED_OUTPATIENT_CLINIC_OR_DEPARTMENT_OTHER): Payer: BC Managed Care – PPO

## 2013-05-18 ENCOUNTER — Encounter: Payer: Self-pay | Admitting: Radiation Oncology

## 2013-05-18 ENCOUNTER — Ambulatory Visit: Payer: BC Managed Care – PPO | Admitting: Nutrition

## 2013-05-18 ENCOUNTER — Ambulatory Visit (HOSPITAL_BASED_OUTPATIENT_CLINIC_OR_DEPARTMENT_OTHER): Payer: BC Managed Care – PPO

## 2013-05-18 ENCOUNTER — Ambulatory Visit
Admission: RE | Admit: 2013-05-18 | Discharge: 2013-05-18 | Disposition: A | Discharge status: Home / Self Care | Payer: BC Managed Care – PPO | Source: Ambulatory Visit | Attending: Radiation Oncology | Admitting: Radiation Oncology

## 2013-05-18 VITALS — BP 93/60 | HR 102 | Temp 96.8°F | Resp 18

## 2013-05-18 DIAGNOSIS — C7931 Secondary malignant neoplasm of brain: Secondary | ICD-10-CM

## 2013-05-18 DIAGNOSIS — C7949 Secondary malignant neoplasm of other parts of nervous system: Secondary | ICD-10-CM

## 2013-05-18 DIAGNOSIS — C3490 Malignant neoplasm of unspecified part of unspecified bronchus or lung: Secondary | ICD-10-CM

## 2013-05-18 DIAGNOSIS — Z5111 Encounter for antineoplastic chemotherapy: Secondary | ICD-10-CM

## 2013-05-18 DIAGNOSIS — R110 Nausea: Secondary | ICD-10-CM

## 2013-05-18 DIAGNOSIS — C3430 Malignant neoplasm of lower lobe, unspecified bronchus or lung: Secondary | ICD-10-CM

## 2013-05-18 LAB — BASIC METABOLIC PANEL (CC13)
Anion Gap: 12 mEq/L — ABNORMAL HIGH (ref 3–11)
BUN: 16.2 mg/dL (ref 7.0–26.0)
CHLORIDE: 102 meq/L (ref 98–109)
CO2: 22 mEq/L (ref 22–29)
CREATININE: 1.1 mg/dL (ref 0.7–1.3)
Calcium: 10.1 mg/dL (ref 8.4–10.4)
Glucose: 124 mg/dl (ref 70–140)
Potassium: 4.7 mEq/L (ref 3.5–5.1)
SODIUM: 137 meq/L (ref 136–145)

## 2013-05-18 MED ORDER — DEXAMETHASONE SODIUM PHOSPHATE 20 MG/5ML IJ SOLN
INTRAMUSCULAR | Status: AC
  Filled 2013-05-18: qty 5

## 2013-05-18 MED ORDER — DEXAMETHASONE SODIUM PHOSPHATE 20 MG/5ML IJ SOLN
12.0000 mg | Freq: Once | INTRAMUSCULAR | Status: AC
  Administered 2013-05-18: 12 mg via INTRAVENOUS

## 2013-05-18 MED ORDER — HEPARIN SOD (PORK) LOCK FLUSH 100 UNIT/ML IV SOLN
500.0000 [IU] | Freq: Once | INTRAVENOUS | Status: DC | PRN
  Filled 2013-05-18: qty 5

## 2013-05-18 MED ORDER — ONDANSETRON 8 MG/50ML IVPB (CHCC)
8.0000 mg | INTRAVENOUS | Status: AC
  Administered 2013-05-18: 8 mg via INTRAVENOUS

## 2013-05-18 MED ORDER — SODIUM CHLORIDE 0.9 % IV SOLN
100.0000 mg/m2 | Freq: Once | INTRAVENOUS | Status: AC
  Administered 2013-05-18: 160 mg via INTRAVENOUS
  Filled 2013-05-18: qty 8

## 2013-05-18 MED ORDER — CISPLATIN CHEMO INJECTION 100MG/100ML
79.0000 mg/m2 | Freq: Once | INTRAVENOUS | Status: AC
  Administered 2013-05-18: 130 mg via INTRAVENOUS
  Filled 2013-05-18: qty 130

## 2013-05-18 MED ORDER — SODIUM CHLORIDE 0.9 % IV SOLN
Freq: Once | INTRAVENOUS | Status: AC
  Administered 2013-05-18: 08:00:00 via INTRAVENOUS

## 2013-05-18 MED ORDER — PALONOSETRON HCL INJECTION 0.25 MG/5ML
INTRAVENOUS | Status: AC
  Filled 2013-05-18: qty 5

## 2013-05-18 MED ORDER — ONDANSETRON 8 MG/NS 50 ML IVPB
INTRAVENOUS | Status: AC
  Filled 2013-05-18: qty 8

## 2013-05-18 MED ORDER — SODIUM CHLORIDE 0.9 % IV SOLN
150.0000 mg | Freq: Once | INTRAVENOUS | Status: AC
  Administered 2013-05-18: 150 mg via INTRAVENOUS
  Filled 2013-05-18: qty 5

## 2013-05-18 MED ORDER — MANNITOL 25 % IV SOLN
Freq: Once | INTRAVENOUS | Status: AC
  Administered 2013-05-18: 09:00:00 via INTRAVENOUS
  Filled 2013-05-18: qty 10

## 2013-05-18 MED ORDER — PALONOSETRON HCL INJECTION 0.25 MG/5ML
0.2500 mg | Freq: Once | INTRAVENOUS | Status: AC
  Administered 2013-05-18: 0.25 mg via INTRAVENOUS

## 2013-05-18 MED ORDER — SODIUM CHLORIDE 0.9 % IJ SOLN
10.0000 mL | INTRAMUSCULAR | Status: DC | PRN
  Filled 2013-05-18: qty 10

## 2013-05-18 NOTE — Progress Notes (Signed)
Pt arrives to infusion room nauseous at baseline. He feels that he could vomit at any time. This is reported to Dr. Juliann Mule. Verbal order obtained to give Zofran 8 mg IVPB now.   Patient voided 250 cc prior to cisplatin and 850 cc after cisplatin

## 2013-05-18 NOTE — Patient Instructions (Signed)
[facility] Discharge Instructions for Patients Receiving Chemotherapy  Today you received the following chemotherapy agents: Cisplatin and Etoposide   To help prevent nausea and vomiting after your treatment, we encourage you to take your nausea medication as prescribed. You may take compazine today as needed beginning now. Start Zofran in 3 days.    If you develop nausea and vomiting that is not controlled by your nausea medication, call the clinic.   BELOW ARE SYMPTOMS THAT SHOULD BE REPORTED IMMEDIATELY:  *FEVER GREATER THAN 100.5 F  *CHILLS WITH OR WITHOUT FEVER  NAUSEA AND VOMITING THAT IS NOT CONTROLLED WITH YOUR NAUSEA MEDICATION  *UNUSUAL SHORTNESS OF BREATH  *UNUSUAL BRUISING OR BLEEDING  TENDERNESS IN MOUTH AND THROAT WITH OR WITHOUT PRESENCE OF ULCERS  *URINARY PROBLEMS  *BOWEL PROBLEMS  UNUSUAL RASH Items with * indicate a potential emergency and should be followed up as soon as possible.  Feel free to call the clinic you have any questions or concerns. The clinic phone number is [phone].   Etoposide, VP-16 injection What is this medicine? ETOPOSIDE, VP-16 (e toe POE side) is a chemotherapy drug. It is used to treat testicular cancer, lung cancer, and other cancers. This medicine may be used for other purposes; ask your health care provider or pharmacist if you have questions. COMMON BRAND NAME(S): Etopophos, Toposar, VePesid What should I tell my health care provider before I take this medicine? They need to know if you have any of these conditions: -infection -kidney disease -low blood counts, like low white cell, platelet, or red cell counts -an unusual or allergic reaction to etoposide, other chemotherapeutic agents, other medicines, foods, dyes, or preservatives -pregnant or trying to get pregnant -breast-feeding How should I use this medicine? This medicine is for infusion into a vein. It is administered in a hospital or clinic by  a specially trained health care professional. Talk to your pediatrician regarding the use of this medicine in children. Special care may be needed. Overdosage: If you think you have taken too much of this medicine contact a poison control center or emergency room at once. NOTE: This medicine is only for you. Do not share this medicine with others. What if I miss a dose? It is important not to miss your dose. Call your doctor or health care professional if you are unable to keep an appointment. What may interact with this medicine? -cyclosporine -medicines to increase blood counts like filgrastim, pegfilgrastim, sargramostim -vaccines This list may not describe all possible interactions. Give your health care provider a list of all the medicines, herbs, non-prescription drugs, or dietary supplements you use. Also tell them if you smoke, drink alcohol, or use illegal drugs. Some items may interact with your medicine. What should I watch for while using this medicine? Visit your doctor for checks on your progress. This drug may make you feel generally unwell. This is not uncommon, as chemotherapy can affect healthy cells as well as cancer cells. Report any side effects. Continue your course of treatment even though you feel ill unless your doctor tells you to stop. In some cases, you may be given additional medicines to help with side effects. Follow all directions for their use. Call your doctor or health care professional for advice if you get a fever, chills or sore throat, or other symptoms of a cold or flu. Do not treat yourself. This drug decreases your body's ability to fight infections. Try to avoid being around people who are sick. This  medicine may increase your risk to bruise or bleed. Call your doctor or health care professional if you notice any unusual bleeding. Be careful brushing and flossing your teeth or using a toothpick because you may get an infection or bleed more easily. If you  have any dental work done, tell your dentist you are receiving this medicine. Avoid taking products that contain aspirin, acetaminophen, ibuprofen, naproxen, or ketoprofen unless instructed by your doctor. These medicines may hide a fever. Do not become pregnant while taking this medicine. Women should inform their doctor if they wish to become pregnant or think they might be pregnant. There is a potential for serious side effects to an unborn child. Talk to your health care professional or pharmacist for more information. Do not breast-feed an infant while taking this medicine. What side effects may I notice from receiving this medicine? Side effects that you should report to your doctor or health care professional as soon as possible: -allergic reactions like skin rash, itching or hives, swelling of the face, lips, or tongue -low blood counts - this medicine may decrease the number of white blood cells, red blood cells and platelets. You may be at increased risk for infections and bleeding. -signs of infection - fever or chills, cough, sore throat, pain or difficulty passing urine -signs of decreased platelets or bleeding - bruising, pinpoint red spots on the skin, black, tarry stools, blood in the urine -signs of decreased red blood cells - unusually weak or tired, fainting spells, lightheadedness -breathing problems -changes in vision -mouth or throat sores or ulcers -pain, redness, swelling or irritation at the injection site -pain, tingling, numbness in the hands or feet -redness, blistering, peeling or loosening of the skin, including inside the mouth -seizures -vomiting Side effects that usually do not require medical attention (report to your doctor or health care professional if they continue or are bothersome): -diarrhea -hair loss -loss of appetite -nausea -stomach pain This list may not describe all possible side effects. Call your doctor for medical advice about side effects.  You may report side effects to FDA at 1-800-FDA-1088. Where should I keep my medicine? This drug is given in a hospital or clinic and will not be stored at home. NOTE: This sheet is a summary. It may not cover all possible information. If you have questions about this medicine, talk to your doctor, pharmacist, or health care provider.  2014, [employer]vier/Gold Standard. (2007-06-14 17:24:12) Cisplatin injection What is this medicine? CISPLATIN (SIS pla tin) is a chemotherapy drug. It targets fast dividing cells, like cancer cells, and causes these cells to die. This medicine is used to treat many types of cancer like bladder, ovarian, and testicular cancers. This medicine may be used for other purposes; ask your health care provider or pharmacist if you have questions. COMMON BRAND NAME(S): Platinol -AQ, Platinol What should I tell my health care provider before I take this medicine? They need to know if you have any of these conditions: -blood disorders -hearing problems -kidney disease -recent or ongoing radiation therapy -an unusual or allergic reaction to cisplatin, carboplatin, other chemotherapy, other medicines, foods, dyes, or preservatives -pregnant or trying to get pregnant -breast-feeding How should I use this medicine? This drug is given as an infusion into a vein. It is administered in a hospital or clinic by a specially trained health care professional. Talk to your pediatrician regarding the use of this medicine in children. Special care may be needed. Overdosage: If you think you have taken  too much of this medicine contact a poison control center or emergency room at once. NOTE: This medicine is only for you. Do not share this medicine with others. What if I miss a dose? It is important not to miss a dose. Call your doctor or health care professional if you are unable to keep an appointment. What may interact with this medicine? -dofetilide -foscarnet -medicines for  seizures -medicines to increase blood counts like filgrastim, pegfilgrastim, sargramostim -probenecid -pyridoxine used with altretamine -rituximab -some antibiotics like amikacin, gentamicin, neomycin, polymyxin B, streptomycin, tobramycin -sulfinpyrazone -vaccines -zalcitabine Talk to your doctor or health care professional before taking any of these medicines: -acetaminophen -aspirin -ibuprofen -ketoprofen -naproxen This list may not describe all possible interactions. Give your health care provider a list of all the medicines, herbs, non-prescription drugs, or dietary supplements you use. Also tell them if you smoke, drink alcohol, or use illegal drugs. Some items may interact with your medicine. What should I watch for while using this medicine? Your condition will be monitored carefully while you are receiving this medicine. You will need important blood work done while you are taking this medicine. This drug may make you feel generally unwell. This is not uncommon, as chemotherapy can affect healthy cells as well as cancer cells. Report any side effects. Continue your course of treatment even though you feel ill unless your doctor tells you to stop. In some cases, you may be given additional medicines to help with side effects. Follow all directions for their use. Call your doctor or health care professional for advice if you get a fever, chills or sore throat, or other symptoms of a cold or flu. Do not treat yourself. This drug decreases your body's ability to fight infections. Try to avoid being around people who are sick. This medicine may increase your risk to bruise or bleed. Call your doctor or health care professional if you notice any unusual bleeding. Be careful brushing and flossing your teeth or using a toothpick because you may get an infection or bleed more easily. If you have any dental work done, tell your dentist you are receiving this medicine. Avoid taking products that  contain aspirin, acetaminophen, ibuprofen, naproxen, or ketoprofen unless instructed by your doctor. These medicines may hide a fever. Do not become pregnant while taking this medicine. Women should inform their doctor if they wish to become pregnant or think they might be pregnant. There is a potential for serious side effects to an unborn child. Talk to your health care professional or pharmacist for more information. Do not breast-feed an infant while taking this medicine. Drink fluids as directed while you are taking this medicine. This will help protect your kidneys. Call your doctor or health care professional if you get diarrhea. Do not treat yourself. What side effects may I notice from receiving this medicine? Side effects that you should report to your doctor or health care professional as soon as possible: -allergic reactions like skin rash, itching or hives, swelling of the face, lips, or tongue -signs of infection - fever or chills, cough, sore throat, pain or difficulty passing urine -signs of decreased platelets or bleeding - bruising, pinpoint red spots on the skin, black, tarry stools, nosebleeds -signs of decreased red blood cells - unusually weak or tired, fainting spells, lightheadedness -breathing problems -changes in hearing -gout pain -low blood counts - This drug may decrease the number of white blood cells, red blood cells and platelets. You may be at increased risk  for infections and bleeding. -nausea and vomiting -pain, swelling, redness or irritation at the injection site -pain, tingling, numbness in the hands or feet -problems with balance, movement -trouble passing urine or change in the amount of urine Side effects that usually do not require medical attention (report to your doctor or health care professional if they continue or are bothersome): -changes in vision -loss of appetite -metallic taste in the mouth or changes in taste This list may not describe all  possible side effects. Call your doctor for medical advice about side effects. You may report side effects to FDA at 1-800-FDA-1088. Where should I keep my medicine? This drug is given in a hospital or clinic and will not be stored at home. NOTE: This sheet is a summary. It may not cover all possible information. If you have questions about this medicine, talk to your doctor, pharmacist, or health care provider.  2014, Elsevier/Gold Standard. (2007-05-18 14:40:54)

## 2013-05-18 NOTE — Progress Notes (Signed)
Nutrition followup completed with patient during chemotherapy.  Patient acknowledges his additional weight loss. He is trying to eat more.  He is refusing nutritional supplements.  Patient reports taste alterations, nausea, and poor appetite.  Patient is trying to drink milk shakes prepared by family.  He is eating foods that have protein in them.  Weight was documented as 119.9 pounds March 24, down from 124.9 pounds March 12.  Nutrition diagnosis: Unintended weight loss continues.  Intervention: I provided support and encouragement for patient to continue to try small amounts of oral intake throughout the day.  Encourage patient to drink homemade shakes.  Teach back method used.  Monitoring, evaluation, goals: Patient will tolerate increased oral intake to minimize weight loss.  Next visit: April 15, during chemotherapy.

## 2013-05-19 ENCOUNTER — Other Ambulatory Visit (HOSPITAL_BASED_OUTPATIENT_CLINIC_OR_DEPARTMENT_OTHER): Payer: BC Managed Care – PPO

## 2013-05-19 ENCOUNTER — Ambulatory Visit: Payer: BC Managed Care – PPO

## 2013-05-19 ENCOUNTER — Encounter (INDEPENDENT_AMBULATORY_CARE_PROVIDER_SITE_OTHER): Payer: Self-pay

## 2013-05-19 ENCOUNTER — Ambulatory Visit (HOSPITAL_BASED_OUTPATIENT_CLINIC_OR_DEPARTMENT_OTHER): Payer: BC Managed Care – PPO

## 2013-05-19 VITALS — BP 116/68 | HR 86 | Temp 97.8°F | Resp 18

## 2013-05-19 DIAGNOSIS — C7931 Secondary malignant neoplasm of brain: Secondary | ICD-10-CM

## 2013-05-19 DIAGNOSIS — C3490 Malignant neoplasm of unspecified part of unspecified bronchus or lung: Secondary | ICD-10-CM

## 2013-05-19 DIAGNOSIS — C3430 Malignant neoplasm of lower lobe, unspecified bronchus or lung: Secondary | ICD-10-CM

## 2013-05-19 DIAGNOSIS — Z5111 Encounter for antineoplastic chemotherapy: Secondary | ICD-10-CM

## 2013-05-19 DIAGNOSIS — C7949 Secondary malignant neoplasm of other parts of nervous system: Secondary | ICD-10-CM

## 2013-05-19 LAB — CBC WITH DIFFERENTIAL/PLATELET
BASO%: 0 % (ref 0.0–2.0)
BASOS ABS: 0 10*3/uL (ref 0.0–0.1)
EOS ABS: 0 10*3/uL (ref 0.0–0.5)
EOS%: 0 % (ref 0.0–7.0)
HCT: 37.3 % — ABNORMAL LOW (ref 38.4–49.9)
HEMOGLOBIN: 12.8 g/dL — AB (ref 13.0–17.1)
LYMPH#: 0.2 10*3/uL — AB (ref 0.9–3.3)
LYMPH%: 0.9 % — ABNORMAL LOW (ref 14.0–49.0)
MCH: 31.1 pg (ref 27.2–33.4)
MCHC: 34.3 g/dL (ref 32.0–36.0)
MCV: 90.5 fL (ref 79.3–98.0)
MONO#: 0.7 10*3/uL (ref 0.1–0.9)
MONO%: 3.1 % (ref 0.0–14.0)
NEUT%: 96 % — ABNORMAL HIGH (ref 39.0–75.0)
NEUTROS ABS: 21.3 10*3/uL — AB (ref 1.5–6.5)
NRBC: 0 % (ref 0–0)
Platelets: 368 10*3/uL (ref 140–400)
RBC: 4.12 10*6/uL — AB (ref 4.20–5.82)
RDW: 12.3 % (ref 11.0–14.6)
WBC: 22.2 10*3/uL — ABNORMAL HIGH (ref 4.0–10.3)

## 2013-05-19 LAB — BASIC METABOLIC PANEL (CC13)
Anion Gap: 11 mEq/L (ref 3–11)
BUN: 20.6 mg/dL (ref 7.0–26.0)
CHLORIDE: 105 meq/L (ref 98–109)
CO2: 20 mEq/L — ABNORMAL LOW (ref 22–29)
Calcium: 9.7 mg/dL (ref 8.4–10.4)
Creatinine: 1 mg/dL (ref 0.7–1.3)
Glucose: 166 mg/dl — ABNORMAL HIGH (ref 70–140)
POTASSIUM: 4.8 meq/L (ref 3.5–5.1)
SODIUM: 135 meq/L — AB (ref 136–145)

## 2013-05-19 MED ORDER — PROCHLORPERAZINE EDISYLATE 5 MG/ML IJ SOLN
INTRAMUSCULAR | Status: AC
  Filled 2013-05-19: qty 2

## 2013-05-19 MED ORDER — DEXAMETHASONE SODIUM PHOSPHATE 10 MG/ML IJ SOLN
10.0000 mg | Freq: Once | INTRAMUSCULAR | Status: AC
  Administered 2013-05-19: 10 mg via INTRAVENOUS

## 2013-05-19 MED ORDER — PROCHLORPERAZINE EDISYLATE 5 MG/ML IJ SOLN
10.0000 mg | Freq: Once | INTRAMUSCULAR | Status: AC
  Administered 2013-05-19: 10 mg via INTRAVENOUS

## 2013-05-19 MED ORDER — DEXAMETHASONE SODIUM PHOSPHATE 10 MG/ML IJ SOLN
INTRAMUSCULAR | Status: AC
  Filled 2013-05-19: qty 1

## 2013-05-19 MED ORDER — SODIUM CHLORIDE 0.9 % IV SOLN
Freq: Once | INTRAVENOUS | Status: AC
  Administered 2013-05-19: 14:00:00 via INTRAVENOUS

## 2013-05-19 MED ORDER — SODIUM CHLORIDE 0.9 % IV SOLN
100.0000 mg/m2 | Freq: Once | INTRAVENOUS | Status: AC
  Administered 2013-05-19: 160 mg via INTRAVENOUS
  Filled 2013-05-19: qty 8

## 2013-05-19 NOTE — Patient Instructions (Signed)
[facility] Discharge Instructions for Patients Receiving Chemotherapy  Today you received the following chemotherapy agents Etoposide.  To help prevent nausea and vomiting after your treatment, we encourage you to take your nausea medication.   If you develop nausea and vomiting that is not controlled by your nausea medication, call the clinic.   BELOW ARE SYMPTOMS THAT SHOULD BE REPORTED IMMEDIATELY:  *FEVER GREATER THAN 100.5 F  *CHILLS WITH OR WITHOUT FEVER  NAUSEA AND VOMITING THAT IS NOT CONTROLLED WITH YOUR NAUSEA MEDICATION  *UNUSUAL SHORTNESS OF BREATH  *UNUSUAL BRUISING OR BLEEDING  TENDERNESS IN MOUTH AND THROAT WITH OR WITHOUT PRESENCE OF ULCERS  *URINARY PROBLEMS  *BOWEL PROBLEMS  UNUSUAL RASH Items with * indicate a potential emergency and should be followed up as soon as possible.  Feel free to call the clinic you have any questions or concerns. The clinic phone number is [phone].

## 2013-05-20 ENCOUNTER — Ambulatory Visit: Payer: BC Managed Care – PPO

## 2013-05-20 ENCOUNTER — Other Ambulatory Visit: Payer: Self-pay | Admitting: Internal Medicine

## 2013-05-20 ENCOUNTER — Telehealth: Payer: Self-pay

## 2013-05-20 ENCOUNTER — Ambulatory Visit (HOSPITAL_BASED_OUTPATIENT_CLINIC_OR_DEPARTMENT_OTHER): Payer: BC Managed Care – PPO

## 2013-05-20 ENCOUNTER — Other Ambulatory Visit (HOSPITAL_BASED_OUTPATIENT_CLINIC_OR_DEPARTMENT_OTHER): Payer: BC Managed Care – PPO

## 2013-05-20 VITALS — BP 124/70 | HR 79 | Temp 97.7°F | Resp 19

## 2013-05-20 DIAGNOSIS — C3490 Malignant neoplasm of unspecified part of unspecified bronchus or lung: Secondary | ICD-10-CM

## 2013-05-20 DIAGNOSIS — C7949 Secondary malignant neoplasm of other parts of nervous system: Secondary | ICD-10-CM

## 2013-05-20 DIAGNOSIS — C3430 Malignant neoplasm of lower lobe, unspecified bronchus or lung: Secondary | ICD-10-CM

## 2013-05-20 DIAGNOSIS — C7931 Secondary malignant neoplasm of brain: Secondary | ICD-10-CM

## 2013-05-20 DIAGNOSIS — Z5111 Encounter for antineoplastic chemotherapy: Secondary | ICD-10-CM

## 2013-05-20 LAB — BASIC METABOLIC PANEL (CC13)
Anion Gap: 13 mEq/L — ABNORMAL HIGH (ref 3–11)
BUN: 30.2 mg/dL — ABNORMAL HIGH (ref 7.0–26.0)
CO2: 19 mEq/L — ABNORMAL LOW (ref 22–29)
Calcium: 9.7 mg/dL (ref 8.4–10.4)
Chloride: 103 mEq/L (ref 98–109)
Creatinine: 1.2 mg/dL (ref 0.7–1.3)
GLUCOSE: 141 mg/dL — AB (ref 70–140)
POTASSIUM: 4.5 meq/L (ref 3.5–5.1)
Sodium: 134 mEq/L — ABNORMAL LOW (ref 136–145)

## 2013-05-20 MED ORDER — PROCHLORPERAZINE EDISYLATE 5 MG/ML IJ SOLN
INTRAMUSCULAR | Status: AC
  Filled 2013-05-20: qty 2

## 2013-05-20 MED ORDER — DEXAMETHASONE SODIUM PHOSPHATE 10 MG/ML IJ SOLN
10.0000 mg | Freq: Once | INTRAMUSCULAR | Status: AC
  Administered 2013-05-20: 10 mg via INTRAVENOUS

## 2013-05-20 MED ORDER — DEXAMETHASONE SODIUM PHOSPHATE 10 MG/ML IJ SOLN
INTRAMUSCULAR | Status: AC
  Filled 2013-05-20: qty 1

## 2013-05-20 MED ORDER — SODIUM CHLORIDE 0.9 % IV SOLN
100.0000 mg/m2 | Freq: Once | INTRAVENOUS | Status: AC
  Administered 2013-05-20: 160 mg via INTRAVENOUS
  Filled 2013-05-20: qty 8

## 2013-05-20 MED ORDER — PROCHLORPERAZINE EDISYLATE 5 MG/ML IJ SOLN
10.0000 mg | Freq: Once | INTRAMUSCULAR | Status: AC
  Administered 2013-05-20: 10 mg via INTRAVENOUS

## 2013-05-20 MED ORDER — SODIUM CHLORIDE 0.9 % IV SOLN
Freq: Once | INTRAVENOUS | Status: AC
  Administered 2013-05-20: 14:00:00 via INTRAVENOUS

## 2013-05-20 MED ORDER — LORAZEPAM 0.5 MG PO TABS: 0.5000 mg | ORAL_TABLET | Freq: Three times a day (TID) | ORAL | Status: DC

## 2013-05-20 NOTE — Patient Instructions (Signed)
[facility] Discharge Instructions for Patients Receiving Chemotherapy  Today you received the following chemotherapy agent Etoposide (VP-16)  To help prevent nausea and vomiting after your treatment, we encourage you to take your nausea medication.   If you develop nausea and vomiting that is not controlled by your nausea medication, call the clinic.   BELOW ARE SYMPTOMS THAT SHOULD BE REPORTED IMMEDIATELY:  *FEVER GREATER THAN 100.5 F  *CHILLS WITH OR WITHOUT FEVER  NAUSEA AND VOMITING THAT IS NOT CONTROLLED WITH YOUR NAUSEA MEDICATION  *UNUSUAL SHORTNESS OF BREATH  *UNUSUAL BRUISING OR BLEEDING  TENDERNESS IN MOUTH AND THROAT WITH OR WITHOUT PRESENCE OF ULCERS  *URINARY PROBLEMS  *BOWEL PROBLEMS  UNUSUAL RASH Items with * indicate a potential emergency and should be followed up as soon as possible.  Feel free to call the clinic you have any questions or concerns. The clinic phone number is [phone].

## 2013-05-20 NOTE — Telephone Encounter (Signed)
Patient's wife Ricky Villanueva called and left message requesting to get oxygen mask for husband to wear at night.Script written for simple mask to be worn with 2 liters of oxygen at night and faxed to [facility].Lecretia notified with order and message left with Ricky Villanueva that this has been taken care of.

## 2013-05-21 ENCOUNTER — Ambulatory Visit (HOSPITAL_BASED_OUTPATIENT_CLINIC_OR_DEPARTMENT_OTHER): Payer: BC Managed Care – PPO

## 2013-05-21 VITALS — BP 118/59 | HR 77 | Temp 97.5°F

## 2013-05-21 DIAGNOSIS — C7949 Secondary malignant neoplasm of other parts of nervous system: Secondary | ICD-10-CM

## 2013-05-21 DIAGNOSIS — C7931 Secondary malignant neoplasm of brain: Secondary | ICD-10-CM

## 2013-05-21 DIAGNOSIS — Z5189 Encounter for other specified aftercare: Secondary | ICD-10-CM

## 2013-05-21 DIAGNOSIS — C3430 Malignant neoplasm of lower lobe, unspecified bronchus or lung: Secondary | ICD-10-CM

## 2013-05-21 DIAGNOSIS — C3490 Malignant neoplasm of unspecified part of unspecified bronchus or lung: Secondary | ICD-10-CM

## 2013-05-21 MED ORDER — PEGFILGRASTIM INJECTION 6 MG/0.6ML
6.0000 mg | Freq: Once | SUBCUTANEOUS | Status: AC
  Administered 2013-05-21: 6 mg via SUBCUTANEOUS

## 2013-05-21 NOTE — Patient Instructions (Signed)

## 2013-05-22 NOTE — Progress Notes (Signed)
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   Narrative: The patient tolerated radiation treatment relatively well.   He had mild improvement in his breathing symptoms.   Plan: The patient has completed radiation treatment. The patient will return to radiation oncology clinic for routine followup in one month. I advised them to call or return sooner if they have any questions or concerns related to their recovery or treatment.  ------------------------------------------------  [name], MD

## 2013-05-23 ENCOUNTER — Ambulatory Visit: Payer: BC Managed Care – PPO

## 2013-05-23 ENCOUNTER — Other Ambulatory Visit: Payer: Self-pay | Admitting: Internal Medicine

## 2013-05-24 ENCOUNTER — Other Ambulatory Visit: Payer: Self-pay

## 2013-05-24 ENCOUNTER — Telehealth: Payer: Self-pay

## 2013-05-24 ENCOUNTER — Ambulatory Visit: Payer: BC Managed Care – PPO

## 2013-05-24 NOTE — Telephone Encounter (Signed)
Returning pt call from 1146am. Pt c/o extreme acid reflux. He is taking protonix and zantac and it is not helping. He will have to spit out reflux liquids. He has blisters in his mouth. His throat is scratchy. He feels like his tongue is chapped or has fissures. His wife states the blisters have a wite coating on them. She says his tongue is also red and beefy. It hurts to swallow "clear down to his belly button". He is unable to eat -he has tried milk and ensure as well as mild foods. Reflux is worse in last 3-4 days. Blisters since Sunday. His dx is SCLC with brain mets. He finished XRT to his brain and chest 3/24. He had first cycle of cisplatin and etoposide 3/25-3/27 with neulasta on 3/28. Family is asking for help.

## 2013-05-24 NOTE — Telephone Encounter (Signed)
OK to add pt to be seen tomorrow at 945 am after labs appt In the mean time, continue hydration with sips of water and take oxycodone 1 to 2 tabs every 4 hours as needed to control the pain. Recommend baking soda mix with warm water gargle every 3-4 hours

## 2013-05-24 NOTE — Progress Notes (Signed)
POF and call to scheduler for appt with Dr Alvy Bimler tomorrow. S/w daughter [name] with Dr Alvy Bimler suggestions and appt tomorrow. She wrote them down and said she would call pt.

## 2013-05-25 ENCOUNTER — Inpatient Hospital Stay (HOSPITAL_COMMUNITY)
Admission: AD | Admit: 2013-05-25 | Discharge: 2013-05-31 | DRG: 157 | Disposition: A | Discharge status: Home / Self Care | Payer: BC Managed Care – PPO | Source: Ambulatory Visit | Attending: Internal Medicine | Admitting: Internal Medicine

## 2013-05-25 ENCOUNTER — Ambulatory Visit: Payer: BC Managed Care – PPO | Admitting: Hematology and Oncology

## 2013-05-25 ENCOUNTER — Ambulatory Visit: Payer: BC Managed Care – PPO

## 2013-05-25 ENCOUNTER — Encounter (HOSPITAL_COMMUNITY): Payer: Self-pay

## 2013-05-25 ENCOUNTER — Other Ambulatory Visit: Payer: Self-pay | Admitting: Hematology and Oncology

## 2013-05-25 ENCOUNTER — Other Ambulatory Visit (HOSPITAL_BASED_OUTPATIENT_CLINIC_OR_DEPARTMENT_OTHER): Payer: BC Managed Care – PPO

## 2013-05-25 VITALS — BP 81/55 | HR 118 | Temp 97.2°F | Resp 20 | Ht 67.0 in | Wt 113.8 lb

## 2013-05-25 DIAGNOSIS — E46 Unspecified protein-calorie malnutrition: Secondary | ICD-10-CM

## 2013-05-25 DIAGNOSIS — R634 Abnormal weight loss: Secondary | ICD-10-CM

## 2013-05-25 DIAGNOSIS — D709 Neutropenia, unspecified: Secondary | ICD-10-CM

## 2013-05-25 DIAGNOSIS — J189 Pneumonia, unspecified organism: Secondary | ICD-10-CM

## 2013-05-25 DIAGNOSIS — R918 Other nonspecific abnormal finding of lung field: Secondary | ICD-10-CM

## 2013-05-25 DIAGNOSIS — K1230 Oral mucositis (ulcerative), unspecified: Secondary | ICD-10-CM

## 2013-05-25 DIAGNOSIS — R5081 Fever presenting with conditions classified elsewhere: Secondary | ICD-10-CM | POA: Diagnosis present

## 2013-05-25 DIAGNOSIS — R0902 Hypoxemia: Secondary | ICD-10-CM

## 2013-05-25 DIAGNOSIS — J439 Emphysema, unspecified: Secondary | ICD-10-CM

## 2013-05-25 DIAGNOSIS — E785 Hyperlipidemia, unspecified: Secondary | ICD-10-CM | POA: Diagnosis present

## 2013-05-25 DIAGNOSIS — I10 Essential (primary) hypertension: Secondary | ICD-10-CM

## 2013-05-25 DIAGNOSIS — K209 Esophagitis, unspecified without bleeding: Secondary | ICD-10-CM

## 2013-05-25 DIAGNOSIS — I959 Hypotension, unspecified: Secondary | ICD-10-CM | POA: Diagnosis not present

## 2013-05-25 DIAGNOSIS — D72819 Decreased white blood cell count, unspecified: Secondary | ICD-10-CM

## 2013-05-25 DIAGNOSIS — C7931 Secondary malignant neoplasm of brain: Secondary | ICD-10-CM

## 2013-05-25 DIAGNOSIS — K121 Other forms of stomatitis: Principal | ICD-10-CM

## 2013-05-25 DIAGNOSIS — C7949 Secondary malignant neoplasm of other parts of nervous system: Secondary | ICD-10-CM

## 2013-05-25 DIAGNOSIS — C3430 Malignant neoplasm of lower lobe, unspecified bronchus or lung: Secondary | ICD-10-CM

## 2013-05-25 DIAGNOSIS — D6959 Other secondary thrombocytopenia: Secondary | ICD-10-CM

## 2013-05-25 DIAGNOSIS — J9691 Respiratory failure, unspecified with hypoxia: Secondary | ICD-10-CM

## 2013-05-25 DIAGNOSIS — M129 Arthropathy, unspecified: Secondary | ICD-10-CM | POA: Diagnosis present

## 2013-05-25 DIAGNOSIS — E43 Unspecified severe protein-calorie malnutrition: Secondary | ICD-10-CM

## 2013-05-25 DIAGNOSIS — K5900 Constipation, unspecified: Secondary | ICD-10-CM

## 2013-05-25 DIAGNOSIS — H9190 Unspecified hearing loss, unspecified ear: Secondary | ICD-10-CM | POA: Diagnosis present

## 2013-05-25 DIAGNOSIS — C3490 Malignant neoplasm of unspecified part of unspecified bronchus or lung: Secondary | ICD-10-CM

## 2013-05-25 DIAGNOSIS — T451X5A Adverse effect of antineoplastic and immunosuppressive drugs, initial encounter: Secondary | ICD-10-CM | POA: Diagnosis present

## 2013-05-25 DIAGNOSIS — I2699 Other pulmonary embolism without acute cor pulmonale: Secondary | ICD-10-CM

## 2013-05-25 DIAGNOSIS — Z880 Allergy status to penicillin: Secondary | ICD-10-CM

## 2013-05-25 DIAGNOSIS — K219 Gastro-esophageal reflux disease without esophagitis: Secondary | ICD-10-CM

## 2013-05-25 DIAGNOSIS — J4489 Other specified chronic obstructive pulmonary disease: Secondary | ICD-10-CM | POA: Diagnosis present

## 2013-05-25 DIAGNOSIS — G893 Neoplasm related pain (acute) (chronic): Secondary | ICD-10-CM

## 2013-05-25 DIAGNOSIS — J449 Chronic obstructive pulmonary disease, unspecified: Secondary | ICD-10-CM | POA: Diagnosis present

## 2013-05-25 DIAGNOSIS — Z86711 Personal history of pulmonary embolism: Secondary | ICD-10-CM

## 2013-05-25 DIAGNOSIS — D61810 Antineoplastic chemotherapy induced pancytopenia: Secondary | ICD-10-CM | POA: Diagnosis present

## 2013-05-25 DIAGNOSIS — N179 Acute kidney failure, unspecified: Secondary | ICD-10-CM

## 2013-05-25 DIAGNOSIS — E860 Dehydration: Secondary | ICD-10-CM

## 2013-05-25 DIAGNOSIS — Z681 Body mass index (BMI) 19 or less, adult: Secondary | ICD-10-CM

## 2013-05-25 DIAGNOSIS — R197 Diarrhea, unspecified: Secondary | ICD-10-CM | POA: Diagnosis not present

## 2013-05-25 LAB — CBC WITH DIFFERENTIAL/PLATELET
BASO%: 0.2 % (ref 0.0–2.0)
Basophils Absolute: 0 10*3/uL (ref 0.0–0.1)
EOS%: 2 % (ref 0.0–7.0)
Eosinophils Absolute: 0.1 10*3/uL (ref 0.0–0.5)
HCT: 42.5 % (ref 38.4–49.9)
HGB: 13.9 g/dL (ref 13.0–17.1)
LYMPH#: 0.4 10*3/uL — AB (ref 0.9–3.3)
LYMPH%: 11.2 % — AB (ref 14.0–49.0)
MCH: 30.6 pg (ref 27.2–33.4)
MCHC: 32.8 g/dL (ref 32.0–36.0)
MCV: 93.2 fL (ref 79.3–98.0)
MONO#: 0 10*3/uL — ABNORMAL LOW (ref 0.1–0.9)
MONO%: 1 % (ref 0.0–14.0)
NEUT#: 3.2 10*3/uL (ref 1.5–6.5)
NEUT%: 85.6 % — ABNORMAL HIGH (ref 39.0–75.0)
Platelets: 90 10*3/uL — ABNORMAL LOW (ref 140–400)
RBC: 4.56 10*6/uL (ref 4.20–5.82)
RDW: 13.2 % (ref 11.0–14.6)
WBC: 3.7 10*3/uL — AB (ref 4.0–10.3)

## 2013-05-25 LAB — BASIC METABOLIC PANEL (CC13)
Anion Gap: 11 mEq/L (ref 3–11)
BUN: 39.1 mg/dL — ABNORMAL HIGH (ref 7.0–26.0)
CALCIUM: 9.8 mg/dL (ref 8.4–10.4)
CHLORIDE: 100 meq/L (ref 98–109)
CO2: 24 mEq/L (ref 22–29)
Creatinine: 1.5 mg/dL — ABNORMAL HIGH (ref 0.7–1.3)
Glucose: 155 mg/dl — ABNORMAL HIGH (ref 70–140)
Potassium: 5.3 mEq/L — ABNORMAL HIGH (ref 3.5–5.1)
SODIUM: 136 meq/L (ref 136–145)

## 2013-05-25 MED ORDER — HYDROMORPHONE HCL PF 1 MG/ML IJ SOLN
1.0000 mg | INTRAMUSCULAR | Status: DC | PRN
  Administered 2013-05-25 – 2013-05-30 (×19): 1 mg via INTRAVENOUS
  Filled 2013-05-25 (×19): qty 1

## 2013-05-25 MED ORDER — TIOTROPIUM BROMIDE MONOHYDRATE 18 MCG IN CAPS
18.0000 ug | ORAL_CAPSULE | Freq: Every day | RESPIRATORY_TRACT | Status: DC
  Administered 2013-05-26 – 2013-05-31 (×6): 18 ug via RESPIRATORY_TRACT
  Filled 2013-05-25 (×3): qty 5

## 2013-05-25 MED ORDER — GUAIFENESIN-DM 100-10 MG/5ML PO SYRP: 10.0000 mL | ORAL_SOLUTION | ORAL | Status: DC | PRN

## 2013-05-25 MED ORDER — ONDANSETRON 8 MG PO TBDP: 4.0000 mg | ORAL_TABLET | Freq: Three times a day (TID) | ORAL | Status: DC | PRN

## 2013-05-25 MED ORDER — SODIUM CHLORIDE 0.9 % IV SOLN
INTRAVENOUS | Status: DC
  Administered 2013-05-25 – 2013-05-26 (×5): via INTRAVENOUS
  Administered 2013-05-27: 1000 mL via INTRAVENOUS
  Administered 2013-05-28 – 2013-05-31 (×6): via INTRAVENOUS

## 2013-05-25 MED ORDER — ONDANSETRON 8 MG/NS 50 ML IVPB
8.0000 mg | Freq: Three times a day (TID) | INTRAVENOUS | Status: DC | PRN
  Filled 2013-05-25: qty 8

## 2013-05-25 MED ORDER — POLYETHYLENE GLYCOL 3350 17 G PO PACK
17.0000 g | PACK | Freq: Every day | ORAL | Status: DC
  Administered 2013-05-26 – 2013-05-28 (×2): 17 g via ORAL
  Filled 2013-05-25 (×7): qty 1

## 2013-05-25 MED ORDER — ALUM & MAG HYDROXIDE-SIMETH 200-200-20 MG/5ML PO SUSP
60.0000 mL | Freq: Three times a day (TID) | ORAL | Status: DC
  Administered 2013-05-25 – 2013-05-31 (×17): 60 mL via ORAL
  Filled 2013-05-25 (×17): qty 60

## 2013-05-25 MED ORDER — OXYCODONE HCL 5 MG PO TABS
5.0000 mg | ORAL_TABLET | ORAL | Status: DC | PRN
  Administered 2013-05-26 – 2013-05-31 (×4): 5 mg via ORAL
  Filled 2013-05-25 (×4): qty 1

## 2013-05-25 MED ORDER — ACETAMINOPHEN 325 MG PO TABS
650.0000 mg | ORAL_TABLET | ORAL | Status: DC | PRN
  Administered 2013-05-27: 650 mg via ORAL
  Filled 2013-05-25: qty 2

## 2013-05-25 MED ORDER — LORAZEPAM 0.5 MG PO TABS
0.5000 mg | ORAL_TABLET | Freq: Three times a day (TID) | ORAL | Status: DC
  Administered 2013-05-25 – 2013-05-26 (×3): 0.5 mg via ORAL
  Filled 2013-05-25 (×3): qty 1

## 2013-05-25 MED ORDER — RIVAROXABAN 20 MG PO TABS
20.0000 mg | ORAL_TABLET | Freq: Every day | ORAL | Status: DC
  Administered 2013-05-25: 20 mg via ORAL
  Filled 2013-05-25 (×2): qty 1

## 2013-05-25 MED ORDER — PANTOPRAZOLE SODIUM 40 MG PO TBEC
40.0000 mg | DELAYED_RELEASE_TABLET | Freq: Every day | ORAL | Status: DC
  Administered 2013-05-26 – 2013-05-27 (×2): 40 mg via ORAL
  Filled 2013-05-25 (×3): qty 1

## 2013-05-25 MED ORDER — FAMOTIDINE 20 MG PO TABS
20.0000 mg | ORAL_TABLET | Freq: Every day | ORAL | Status: DC
  Administered 2013-05-25 – 2013-05-30 (×6): 20 mg via ORAL
  Filled 2013-05-25 (×7): qty 1

## 2013-05-25 MED ORDER — RIVAROXABAN 20 MG PO TABS
20.0000 mg | ORAL_TABLET | Freq: Every day | ORAL | Status: DC
  Filled 2013-05-25: qty 1

## 2013-05-25 MED ORDER — ONDANSETRON HCL 4 MG/2ML IJ SOLN
4.0000 mg | Freq: Three times a day (TID) | INTRAMUSCULAR | Status: DC | PRN
  Administered 2013-05-25: 4 mg via INTRAVENOUS
  Filled 2013-05-25: qty 2

## 2013-05-25 MED ORDER — ONDANSETRON HCL 4 MG PO TABS: 4.0000 mg | ORAL_TABLET | Freq: Three times a day (TID) | ORAL | Status: DC | PRN

## 2013-05-25 NOTE — H&P (Signed)
[place] ADMISSION NOTE  Patient Care Team: Chipper Herb, MD as PCP - General (Family Medicine)  CHIEF COMPLAINTS/PURPOSE OF ADMISSION:  Severe mucositis, esophagitis, dehydration due to poor oral intake, weight loss and uncontrolled pain  HISTORY OF PRESENTING ILLNESS:  Ricky Villanueva 63 y.o. male is here because of complaints above. This is a patient currently under the care of Dr. Juliann Mule that I saw today on his behalf as he is away. He was last seen by Dr. Juliann Mule about a week ago prior to systemic chemotherapy. In brief, the summary of his oncologic history is as follows:  The patient presented to the ER on 04/21/13 with worsening dyspnea and hemoptysis. In the ED, his oxygen saturation was 91% on 2 L nasal canula. CXR revealed a progressive increased density in the right lower lobe consistent with worsening atelectasis or pneumonia and a soft tissue fullness in the hilar region suspicious for lymphadenopathy or mass. CT of chest evealed a nonocclusive left upper lobe, right lower lobe pulmonary emboli with right heart strain. There was presence of bulky mediastinal and right hilar lymphadenopathy, encasing the of pulmonary arteries, pulmonary veins and bronchi, completely effacing right lower lobe segmental bronchi with extensive consolidation in the right lung suggested postobstructive pneumonia, with interstitial prominence which may reflect superimposed lymphangitic spread of infection or neoplasm. He was admitted and started on antibiotics and heparin. Pulmonary was consulted and he had a video bronchoscopy on 04/26/13 with consistent with small cell cancer of lung. 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 The patient was treated with radiation therapy to his brain under the care of Dr. [name]. On 05/18/13, he received cycle 1 of cisplatin &  Etoposide as systemic treatment for extensive stage small cell lung cancer.  Since his chemo treatment, he developed severe mucositis, esophagitis, uncontrolled pain, poor appetite and poor oral intake with weakness, dizziness and dehydration. The patient denies any mouth sores, nausea, vomiting or change in bowel habits. He had lost more than 5 pounds of weight in 1 week. He denies any recent fever, chills, night sweats. He denies recent hemoptysis. He was not able to swallow pills due to severe esophagitis. He is rating his pain at 10/10. He had been constipated for 3 days prior to admission.  MEDICAL HISTORY:  Past Medical History  Diagnosis Date  . Hyperlipidemia   . Hypertension   . GERD (gastroesophageal reflux disease)   . Respiratory failure with hypoxia 04/21/2013    secondary to pneumonia/notes 04/21/2013  . Pulmonary embolism     "got one in there now" (04/21/2013)  . COPD (chronic obstructive pulmonary disease)   . Pneumonia 2/?01/2014    "wouldn't get better; hospitalized 04/21/2013)  . Arthritis     "minor in my right hand" (04/21/2013)    SURGICAL HISTORY: Past Surgical History  Procedure Laterality Date  . Inguinal hernia repair Right ~ 2005  . Finger surgery Left ~ 1989    "crushed end of my middle finger off"   . Video bronchoscopy Bilateral 04/26/2013    Procedure: VIDEO BRONCHOSCOPY WITH FLUORO;  Surgeon: Wilhelmina Mcardle, MD;  Location: [facility] ENDOSCOPY;  Service: Cardiopulmonary;  Laterality: Bilateral;    SOCIAL HISTORY: History   Social History  . Marital Status: Single    Spouse Name: N/A    Number of Children: 1  .  Years of Education: N/A   Occupational History  .  [employer]   Social History Main Topics  . Smoking status: Former Smoker -- 2.00 packs/day for 47 years    Types: Cigarettes    Quit date: 12/25/2011  . Smokeless tobacco: Never Used  . Alcohol Use: No     Comment: 04/21/2013 "used to drink a little bit; very little; nothing in years"  . Drug  Use: No  . Sexual Activity: Not Currently   Other Topics Concern  . Not on file   Social History Narrative  . No narrative on file    FAMILY HISTORY: History reviewed. No pertinent family history.  ALLERGIES:  is allergic to augmentin.  MEDICATIONS:  Current Facility-Administered Medications  Medication Dose Route Frequency Provider Last Rate Last Dose  . 0.9 %  sodium chloride infusion   Intravenous Continuous Heath Lark, MD 150 mL/hr at 05/25/13 1202    . acetaminophen (TYLENOL) tablet 650 mg  650 mg Oral Q4H PRN Heath Lark, MD      . alum & mag hydroxide-simeth (MAALOX/MYLANTA) 200-200-20 MG/5ML suspension 60 mL  60 mL Oral TID Heath Lark, MD      . famotidine (PEPCID) tablet 20 mg  20 mg Oral QHS Kimyata Milich, MD      . guaiFENesin-dextromethorphan (ROBITUSSIN DM) 100-10 MG/5ML syrup 10 mL  10 mL Oral Q4H PRN Manas Hickling, MD      . HYDROmorphone (DILAUDID) injection 1 mg  1 mg Intravenous Q2H PRN Heath Lark, MD   1 mg at 05/25/13 1200  . LORazepam (ATIVAN) tablet 0.5 mg  0.5 mg Oral 3 times per day Heath Lark, MD      . ondansetron (ZOFRAN) tablet 4-8 mg  4-8 mg Oral Q8H PRN Heath Lark, MD       Or  . ondansetron (ZOFRAN-ODT) disintegrating tablet 4-8 mg  4-8 mg Oral Q8H PRN Heath Lark, MD       Or  . ondansetron (ZOFRAN) injection 4 mg  4 mg Intravenous Q8H PRN Heath Lark, MD   4 mg at 05/25/13 1200   Or  . ondansetron (ZOFRAN) 8 mg/NS 50 ml IVPB  8 mg Intravenous Q8H PRN Angelly Spearing, MD      . oxyCODONE (Oxy IR/ROXICODONE) immediate release tablet 5 mg  5 mg Oral Q4H PRN Heath Lark, MD      . pantoprazole (PROTONIX) EC tablet 40 mg  40 mg Oral Daily Anaiyah Anglemyer, MD      . polyethylene glycol (MIRALAX / GLYCOLAX) packet 17 g  17 g Oral Daily Heath Lark, MD      . Rivaroxaban (XARELTO) tablet 20 mg  20 mg Oral Q supper Heath Lark, MD      . tiotropium (SPIRIVA) inhalation capsule 18 mcg  18 mcg Inhalation Daily Heath Lark, MD        REVIEW OF SYSTEMS:   Constitutional: Denies  fevers, chills or abnormal night sweats Eyes: Denies blurriness of vision, double vision or watery eyes Respiratory: Denies cough, dyspnea or wheezes Cardiovascular: Denies palpitation, chest discomfort or lower extremity swelling Skin: Denies abnormal skin rashes Lymphatics: Denies new lymphadenopathy or easy bruising Behavioral/Psych: Mood is stable, no new changes  All other systems were reviewed with the patient and are negative.  PHYSICAL EXAMINATION: ECOG PERFORMANCE STATUS: 3 - Symptomatic, >50% confined to bed  Filed Vitals:   05/25/13 1053  BP: 110/82  Pulse: 100  Temp: 97.6 F (36.4 C)  Resp: 18  Filed Weights   05/25/13 1053  Weight: 113 lb 12 oz (51.597 kg)    GENERAL:alert, no distress and comfortable. He looks thin and mildly cachectic. SKIN: skin color, texture, turgor are normal, no rashes or significant lesions EYES: normal, conjunctiva are pink and non-injected, sclera clear OROPHARYNX:There is mucositis. No thrush. Dry mucous membrane. NECK: supple, thyroid normal size, non-tender, without nodularity LYMPH:  no palpable lymphadenopathy in the cervical, axillary or inguinal LUNGS: clear to auscultation and percussion with normal breathing effort HEART: regular rate & rhythm, mild tachycardia and no murmurs and no lower extremity edema ABDOMEN:abdomen soft, non-tender and normal bowel sounds Musculoskeletal:no cyanosis of digits and no clubbing  PSYCH: alert & oriented x 3 with fluent speech NEURO: no focal motor/sensory deficits  LABORATORY DATA:  I have reviewed the data as listed Lab Results  Component Value Date   WBC 3.7* 05/25/2013   HGB 13.9 05/25/2013   HCT 42.5 05/25/2013   MCV 93.2 05/25/2013   PLT 90* 05/25/2013    Recent Labs  04/21/13 1820 04/22/13 0406 04/28/13 0505 05/16/13 1519  05/19/13 1338 05/20/13 1304 05/25/13 0929  NA 135* 137 140 140  < > 135* 134* 136  K 4.2 3.9 4.3 5.1  < > 4.8 4.5 5.3*  CL 97 102 100  --   --   --   --   --    CO2 20 22 26 25   < > 20* 19* 24  GLUCOSE 210* 216* 93 110  < > 166* 141* 155*  BUN 15 17 9  17.7  < > 20.6 30.2* 39.1*  CREATININE 1.04 0.96 0.85 1.0  < > 1.0 1.2 1.5*  CALCIUM 8.5 8.7 9.4 10.4  < > 9.7 9.7 9.8  GFRNONAA 76* 88* >90  --   --   --   --   --   GFRAA 88* >90 >90  --   --   --   --   --   PROT 6.5 6.1  --  8.1  --   --   --   --   ALBUMIN 2.8* 2.6*  --  3.2*  --   --   --   --   AST 17 14  --  19  --   --   --   --   ALT 13 10  --  17  --   --   --   --   ALKPHOS 78 70  --  91  --   --   --   --   BILITOT 0.4 0.3  --  0.33  --   --   --   --   < > = values in this interval not displayed.   ASSESSMENT & PLAN:  #1 Extensive small cell lung cancer Continue supportive care. He had received cycle 1 of treatment recently #2 Severe mucositis and esophagitis I will schedule round the clock Maalox and continue on proton pump inhibitor. I will start him on prn Dilaudid IV. I will consider fentanyl patch if he appears to require a lot of pain medications. #3 Severe dehydration with acute renal failure I will start him on aggressive IVF and stop his anti-hypertensive medications #4 Leukopenia This is likely due to recent treatment. The patient denies recent history of fevers, cough, chills, diarrhea or dysuria. He is asymptomatic from the leukopenia. I will observe for now.   #5 Thrombocytopenia This is likely due to recent treatment. The patient denies recent history of  bleeding such as epistaxis, hematuria or hematochezia. He is asymptomatic from the low platelet count. I will observe for now.   If his platelet count drops to less than 50,000, I will hold his anticoagulation therapy #6 Bilateral PE I will change Xarelto to daily dosing #7 Hypoxemia Likely related to his lung cancer and PE. Continue on oxygen #8 Weight loss and malnutrition Will consult nutritionist. Continue full liquid diet only due to severe esophagitis.    [facility], [place], MD 05/25/2013 3:21 PM

## 2013-05-25 NOTE — Progress Notes (Signed)
INITIAL NUTRITION ASSESSMENT  DOCUMENTATION CODES Per approved criteria  -Severe malnutrition in the context of chronic illness -Underweight  Pt meets criteria for severe MALNUTRITION in the context of chronic illnes as evidenced by 18% weight loss in<3 months, PO intake <75% est nutrition needs for > one month, evident signs of muscle wasting and subcutaneous fat loss.   INTERVENTION: -Discussed with pharmacy recommendations for muscositis/GERD- consider GelClair Mucosal Barrier Gel -Recommend Resource Breeze po BID, each supplement provides 250 kcal and 9 grams of protein as tolerated -Recommend Unjury Chicken Broth BID as tolerated -Will continue to monitor   NUTRITION DIAGNOSIS: Inadequate oral intake related to GERD/mucositis/taste changes/nausea as evidenced by PO intake <75%, unintentional wt loss of 27 lbs Goal: Pt to meet >/= 90% of their estimated nutrition needs    Monitor:  Swallow profile, total protein/energy intake, labs, weights, supplement tolerance  Reason for Assessment: Consult/MST  63 y.o. male  Admitting Dx: <principal problem not specified>  ASSESSMENT: Ricky Villanueva 63 y.o. male is here because of Severe mucositis, esophagitis, dehydration due to poor oral intake, weight loss and uncontrolled pain  -Pt with unintentional wt loss of 22-27 lbs since beginning of chemotherapy treatments. Has occurred over 1.5 months, indicating a significant weight loss of 18% body weight -Has been followed by Outpatient Oncology RD since 3/13. Reported taste alterations, nausea, and poor appetite. Refused Ensure/Boost supplements. Family would prepare homemade milk shakes for pt, which he would also either refuse or only consume small amount of -Was able to tolerate small amount of water on Sunday 3/29. Is not currently unable to tolerate anything liquids or solids d/t GERD, mucositis, and esophagitis -Refused all supplements/PO until GERD/muscositis improves discussed  with RN and Pharm methods to improve oral pain. Pharm recommended Biotin and/or GelClair Mucosal Barriar Gel. RN noted pt may also benefit from [place] wash , Lidocan, or Diflucan -Was wiling to try [employer] and/or Unjury protein supplements as tolerated -Pt with evident signs of muscle wasting and subcutaneous fat loss  Height: Ht Readings from Last 1 Encounters:  05/25/13 5\' 7"  (1.702 m)    Weight: Wt Readings from Last 1 Encounters:  05/25/13 113 lb 12 oz (51.597 kg)    Ideal Body Weight: 148 lbs  % Ideal Body Weight: 76%  Wt Readings from Last 10 Encounters:  05/25/13 113 lb 12 oz (51.597 kg)  05/25/13 113 lb 12.8 oz (51.619 kg)  05/17/13 119 lb 14.4 oz (54.386 kg)  05/16/13 118 lb 4.8 oz (53.661 kg)  05/10/13 123 lb 8 oz (56.019 kg)  05/05/13 124 lb 14.4 oz (56.654 kg)  05/02/13 125 lb 11.2 oz (57.017 kg)  04/29/13 128 lb 8.5 oz (58.3 kg)  04/29/13 128 lb 8.5 oz (58.3 kg)  04/18/13 131 lb 9.6 oz (59.693 kg)    Usual Body Weight: 135-140 lbs  % Usual Body Weight: 82%  BMI:  Body mass index is 17.81 kg/(m^2). Underweight  Estimated Nutritional Needs: Kcal: 1800-2000 Protein: 90-100 gram Fluid: >/=1800 ml/daily  Skin: WDL  Diet Order: Full Liquid  EDUCATION NEEDS: -Education needs addressed  No intake or output data in the 24 hours ending 05/25/13 1618  Last BM: 3/29   Labs:   Recent Labs Lab 05/19/13 1338 05/20/13 1304 05/25/13 0929  NA 135* 134* 136  K 4.8 4.5 5.3*  CO2 20* 19* 24  BUN 20.6 30.2* 39.1*  CREATININE 1.0 1.2 1.5*  CALCIUM 9.7 9.7 9.8  GLUCOSE 166* 141* 155*    CBG (last 3)  No results found for this basename: GLUCAP,  in the last 72 hours  Scheduled Meds: . alum & mag hydroxide-simeth  60 mL Oral TID  . famotidine  20 mg Oral QHS  . LORazepam  0.5 mg Oral 3 times per day  . pantoprazole  40 mg Oral Daily  . polyethylene glycol  17 g Oral Daily  . rivaroxaban  20 mg Oral Q supper  . tiotropium  18 mcg  Inhalation Daily    Continuous Infusions: . sodium chloride 150 mL/hr at 05/25/13 1202    Past Medical History  Diagnosis Date  . Hyperlipidemia   . Hypertension   . GERD (gastroesophageal reflux disease)   . Respiratory failure with hypoxia 04/21/2013    secondary to pneumonia/notes 04/21/2013  . Pulmonary embolism     "got one in there now" (04/21/2013)  . COPD (chronic obstructive pulmonary disease)   . Pneumonia 2/?01/2014    "wouldn't get better; hospitalized 04/21/2013)  . Arthritis     "minor in my right hand" (04/21/2013)    Past Surgical History  Procedure Laterality Date  . Inguinal hernia repair Right ~ 2005  . Finger surgery Left ~ 1989    "crushed end of my middle finger off"   . Video bronchoscopy Bilateral 04/26/2013    Procedure: VIDEO BRONCHOSCOPY WITH FLUORO;  Surgeon: Wilhelmina Mcardle, MD;  Location: [facility] ENDOSCOPY;  Service: Cardiopulmonary;  Laterality: Bilateral;    [place] LDN Clinical Dietitian [phone]

## 2013-05-25 NOTE — Progress Notes (Signed)
His admitted to the hospital. Please see hospital admission H&P.

## 2013-05-26 ENCOUNTER — Ambulatory Visit: Payer: BC Managed Care – PPO

## 2013-05-26 DIAGNOSIS — K5900 Constipation, unspecified: Secondary | ICD-10-CM

## 2013-05-26 DIAGNOSIS — E43 Unspecified severe protein-calorie malnutrition: Secondary | ICD-10-CM | POA: Insufficient documentation

## 2013-05-26 LAB — CBC WITH DIFFERENTIAL/PLATELET
BASOS ABS: 0 10*3/uL (ref 0.0–0.1)
Basophils Relative: 3 % — ABNORMAL HIGH (ref 0–1)
EOS ABS: 0 10*3/uL (ref 0.0–0.7)
Eosinophils Relative: 9 % — ABNORMAL HIGH (ref 0–5)
HEMATOCRIT: 32.3 % — AB (ref 39.0–52.0)
Hemoglobin: 10.7 g/dL — ABNORMAL LOW (ref 13.0–17.0)
LYMPHS ABS: 0.1 10*3/uL — AB (ref 0.7–4.0)
LYMPHS PCT: 46 % (ref 12–46)
MCH: 30.3 pg (ref 26.0–34.0)
MCHC: 33.1 g/dL (ref 30.0–36.0)
MCV: 91.5 fL (ref 78.0–100.0)
Monocytes Absolute: 0 10*3/uL — ABNORMAL LOW (ref 0.1–1.0)
Monocytes Relative: 0 % — ABNORMAL LOW (ref 3–12)
Neutro Abs: 0.2 10*3/uL — ABNORMAL LOW (ref 1.7–7.7)
Neutrophils Relative %: 42 % — ABNORMAL LOW (ref 43–77)
Platelets: 51 10*3/uL — ABNORMAL LOW (ref 150–400)
RBC: 3.53 MIL/uL — ABNORMAL LOW (ref 4.22–5.81)
RDW: 12.6 % (ref 11.5–15.5)
WBC: 0.3 10*3/uL — CL (ref 4.0–10.5)

## 2013-05-26 LAB — COMPREHENSIVE METABOLIC PANEL
ALT: 12 U/L (ref 0–53)
AST: 12 U/L (ref 0–37)
Albumin: 2.5 g/dL — ABNORMAL LOW (ref 3.5–5.2)
Alkaline Phosphatase: 70 U/L (ref 39–117)
BUN: 34 mg/dL — ABNORMAL HIGH (ref 6–23)
CO2: 25 meq/L (ref 19–32)
Calcium: 8.3 mg/dL — ABNORMAL LOW (ref 8.4–10.5)
Chloride: 103 mEq/L (ref 96–112)
Creatinine, Ser: 1.14 mg/dL (ref 0.50–1.35)
GFR calc Af Amer: 78 mL/min — ABNORMAL LOW (ref >90)
GFR calc non Af Amer: 68 mL/min — ABNORMAL LOW (ref >90)
Glucose, Bld: 117 mg/dL — ABNORMAL HIGH (ref 70–99)
Potassium: 5.2 mEq/L (ref 3.7–5.3)
SODIUM: 135 meq/L — AB (ref 137–147)
TOTAL PROTEIN: 5.1 g/dL — AB (ref 6.0–8.3)
Total Bilirubin: 0.5 mg/dL (ref 0.3–1.2)

## 2013-05-26 LAB — MAGNESIUM: Magnesium: 1.8 mg/dL (ref 1.5–2.5)

## 2013-05-26 MED ORDER — LACTULOSE 10 GM/15ML PO SOLN
10.0000 g | Freq: Two times a day (BID) | ORAL | Status: DC
Start: 2013-05-26 — End: 2013-05-31
  Administered 2013-05-26 – 2013-05-28 (×3): 10 g via ORAL
  Filled 2013-05-26 (×11): qty 15

## 2013-05-26 MED ORDER — BOOST / RESOURCE BREEZE PO LIQD
1.0000 | Freq: Two times a day (BID) | ORAL | Status: DC
  Administered 2013-05-26 – 2013-05-31 (×6): 1 via ORAL

## 2013-05-26 MED ORDER — MORPHINE SULFATE ER 15 MG PO TBCR
15.0000 mg | EXTENDED_RELEASE_TABLET | Freq: Two times a day (BID) | ORAL | Status: DC
  Administered 2013-05-26 – 2013-05-27 (×3): 15 mg via ORAL
  Filled 2013-05-26 (×3): qty 1

## 2013-05-26 MED ORDER — GELCLAIR MT GEL
1.0000 | Freq: Three times a day (TID) | OROMUCOSAL | Status: DC
  Administered 2013-05-26 – 2013-05-31 (×13): 1 via ORAL
  Filled 2013-05-26 (×17): qty 1

## 2013-05-26 MED ORDER — LORAZEPAM 0.5 MG PO TABS
0.5000 mg | ORAL_TABLET | Freq: Two times a day (BID) | ORAL | Status: DC
  Administered 2013-05-26 – 2013-05-31 (×10): 0.5 mg via ORAL
  Filled 2013-05-26 (×10): qty 1

## 2013-05-26 MED ORDER — DOCUSATE SODIUM 100 MG PO CAPS
100.0000 mg | ORAL_CAPSULE | Freq: Two times a day (BID) | ORAL | Status: DC
  Administered 2013-05-26 – 2013-05-30 (×3): 100 mg via ORAL
  Filled 2013-05-26 (×11): qty 1

## 2013-05-26 NOTE — Care Management Note (Signed)
   CARE MANAGEMENT NOTE 05/26/2013  Patient:  Ricky Villanueva, Ricky Villanueva   Account Number:  [id number]  Date Initiated:  05/26/2013  Documentation initiated by:  Amatullah Christy  Subjective/Objective Assessment:   63 yo male admitted with dehydration. Chipper Herb, MD as PCP - General (Family Medicine)     Action/Plan:   Home when stable   Anticipated DC Date:     Anticipated DC Plan:  [place]  CM consult      Choice offered to / List presented to:  NA   DME arranged  NA      DME agency  NA     [place] arranged  NA      [place] agency  NA   Status of service:  In process, will continue to follow Medicare Important Message given?   (If response is "NO", the following Medicare IM given date fields will be blank) Date Medicare IM given:   Date Additional Medicare IM given:    Discharge Disposition:    Per UR Regulation:  Reviewed for med. necessity/level of care/duration of stay  If discussed at [place] of Stay Meetings, dates discussed:    Comments:  05/26/13 [place] Abcde Oneil,MSN,RN [phone] Chart reviewed for utilization of services. No needs assessed at this time.

## 2013-05-26 NOTE — Progress Notes (Signed)
Pt with wbc of 0.3 called oncology on call awaiting call back.

## 2013-05-26 NOTE — Progress Notes (Signed)
Nutrition Brief Note  Intervention: -Ordered Resource Breeze TID -Encouraged PO intake -Will continue to monitor  -Pt reported burning sensation in his esophagus, but with improvement in mouth pain -Has been able to tolerate cranberry juice, yogurt and jello. Willing to try [employer]. Informed pt and pt's wife ways to increase its pallatability. Offered alternative flavor of wildberry if pt does not tolerate Ordered some beef broth last night, but did not consume mostly related to taste.  -Tolerating cold foods vs hot foods. Will hold off on ordering Unjury Chicken broth at this time  Following per protocols. Please re-consult as needed [place] Clinical Dietitian [phone]

## 2013-05-26 NOTE — Progress Notes (Signed)
Ricky Villanueva  Telephone:([phone]) [place] NOTE I have seen the patient, examined him and edited the documentation as follows:  [name] was admitted on 05/25/13 with Severe mucositis, esophagitis, dehydration due to poor oral intake, weight loss and uncontrolled pain. Since admission, reflux symptoms are still present, not alleviated enough with PPI. Pain meds help moderately.  Mucositis is improving. He denies any new ulcerations. No nausea,vomiting, diarrhea , positive constipation.  Last bowel movement was on Sunday. Patient wants laxative. No respiratory or cardiac complaints. He had one episode of hemoptysis.  MEDICATIONS: Scheduled Meds: . alum & mag hydroxide-simeth  60 mL Oral TID  . famotidine  20 mg Oral QHS  . LORazepam  0.5 mg Oral 3 times per day  . pantoprazole  40 mg Oral Daily  . polyethylene glycol  17 g Oral Daily  . rivaroxaban  20 mg Oral Q supper  . tiotropium  18 mcg Inhalation Daily   Continuous Infusions: . sodium chloride 150 mL/hr at 05/26/13 0214   PRN Meds:.acetaminophen, guaiFENesin-dextromethorphan, HYDROmorphone (DILAUDID) injection, ondansetron (ZOFRAN) IV, ondansetron (ZOFRAN) IV, ondansetron, ondansetron, oxyCODONE  ALLERGIES:   Allergies  Allergen Reactions  . Augmentin [Amoxicillin-Pot Clavulanate] Itching and Rash    rash     PHYSICAL EXAMINATION:   Filed Vitals:   05/26/13 0648  BP: 110/57  Pulse: 58  Temp: 98.3 F (36.8 C)  Resp: 20     Filed Weights   05/25/13 1053  Weight: 113 lb 12 oz (51.597 kg)    GENERAL:alert, no distress and comfortable. He looks thin and mildly cachectic. temporal wasting noted  SKIN: skin color, texture, turgor are normal, no rashes or significant lesions. Skin is dry EYES: normal, conjunctiva are pink and non-injected, sclera clear  OROPHARYNX  Mucositis is improving. No thrush. Dry mucous membrane.  NECK: supple, thyroid normal size, non-tender, without nodularity   LYMPH: no palpable lymphadenopathy in the cervical, axillary or inguinal  LUNGS: clear to auscultation and percussion with normal breathing effort  HEART: regular rate & rhythm, mild tachycardia and no murmurs and no lower extremity edema  ABDOMEN:abdomen soft, non-tender and hypoactive bowel sounds  Musculoskeletal:no cyanosis of digits and no clubbing  PSYCH: alert & oriented x 3 with fluent speech  NEURO: no focal motor/sensory deficits  LABORATORY/RADIOLOGY DATA:   Recent Labs Lab 05/19/13 1339 05/25/13 0928 05/26/13 0405  WBC 22.2* 3.7* 0.3*  HGB 12.8* 13.9 10.7*  HCT 37.3* 42.5 32.3*  PLT 368 90* 51*  MCV 90.5 93.2 91.5  MCH 31.1 30.6 30.3  MCHC 34.3 32.8 33.1  RDW 12.3 13.2 12.6  LYMPHSABS 0.2* 0.4* 0.1*  MONOABS 0.7 0.0* 0.0*  EOSABS 0.0 0.1 0.0  BASOSABS 0.0 0.0 0.0    CMP    Recent Labs Lab 05/19/13 1338 05/20/13 1304 05/25/13 0929 05/26/13 0405  NA 135* 134* 136 135*  K 4.8 4.5 5.3* 5.2  CL  --   --   --  103  CO2 20* 19* 24 25  GLUCOSE 166* 141* 155* 117*  BUN 20.6 30.2* 39.1* 34*  CREATININE 1.0 1.2 1.5* 1.14  CALCIUM 9.7 9.7 9.8 8.3*  MG  --   --   --  1.8  AST  --   --   --  12  ALT  --   --   --  12  ALKPHOS  --   --   --  70  BILITOT  --   --   --  0.5        Component Value Date/Time   BILITOT 0.5 05/26/2013 0405   BILITOT 0.33 05/16/2013 1519     Liver Function Tests:  Recent Labs Lab 05/26/13 0405  AST 12  ALT 12  ALKPHOS 70  BILITOT 0.5  PROT 5.1*  ALBUMIN 2.5*   ASSESSMENT AND PLAN:   #1 Extensive small cell lung cancer  Continue supportive care. He had received cycle 1 of treatment recently   #2 Severe mucositis and esophagitis  Maalox was scheduled around the clock and proton pump inhibitors were continued. Dilaudid IV prn was started. Mucositis is improving. However his esophagitis is still present, source of his main complaint. I will start him on Gelclair to relieve the pain. I also recommend MS Contin twice a  day.  #3 Severe dehydration with acute renal failure  Aggressive IVF were initiated and  his anti-hypertensive medications were stopped on admission. His oral intake is improving and I plan to reduce the IV fluids to 50 cc an hour.  #4 Leukopenia  This is likely due to recent treatment. The patient denies recent history of fevers, cough, chills, diarrhea or dysuria. He is asymptomatic from the leukopenia.His WBC today is 0.3 with ANC of 0.2. His Neulasta 6 mg injection was received on 3/28. I will observe for now and if it does not increase I will consider starting him on G-CSF while in hospital. Repeat CBC in a.m. Neutropenic precautions recommended.  #5 Thrombocytopenia  This is likely due to recent treatment. He had one episode of hemoptysis. Today the platelet count is 51,000. I will stop his anticoagulation therapy as I expected his platelet count will continue to drop over the next few days.  #6 Bilateral PE   Xarelto was changed to daily dosing. I will hold this due to borderline low platelets. If so, I will order SCD for DVT prophylaxis.   #7 Hypoxemia  Likely related to his lung cancer and PE. Continue on oxygen   #8 Weight loss, protein calorie malnutrition  Appreciate nutritionist consultation. On full liquid diet for now with Resource Breeze po BID and Unjury Chicken Broth BID as tolerated due to severe esophagitis.  #9 Constipation. The patient's last bowel movement was on Sunday. Likely cause is narcotic need, for pain control. I will add lactulose and stool softener. Patient was on Miralax daily without success. Enema avoided due to neutropenia.   #10 Discharge planning Due to progressive worsening pancytopenia, I anticipate the patient will stay here through the weekend.  Ricky Jumbo, PA-C 05/26/2013, 7:06 AM Ricky Mcclimans, MD 05/26/2013

## 2013-05-27 ENCOUNTER — Ambulatory Visit: Payer: BC Managed Care – PPO

## 2013-05-27 ENCOUNTER — Inpatient Hospital Stay (HOSPITAL_COMMUNITY): Payer: BC Managed Care – PPO

## 2013-05-27 ENCOUNTER — Ambulatory Visit: Payer: BC Managed Care – PPO | Admitting: Hematology and Oncology

## 2013-05-27 LAB — CBC WITH DIFFERENTIAL/PLATELET
Basophils Absolute: 0 10*3/uL (ref 0.0–0.1)
Basophils Relative: 8 % — ABNORMAL HIGH (ref 0–1)
EOS PCT: 17 % — AB (ref 0–5)
Eosinophils Absolute: 0 10*3/uL (ref 0.0–0.7)
HCT: 28.4 % — ABNORMAL LOW (ref 39.0–52.0)
Hemoglobin: 9.5 g/dL — ABNORMAL LOW (ref 13.0–17.0)
LYMPHS ABS: 0.1 10*3/uL — AB (ref 0.7–4.0)
Lymphocytes Relative: 75 % — ABNORMAL HIGH (ref 12–46)
MCH: 30.2 pg (ref 26.0–34.0)
MCHC: 33.5 g/dL (ref 30.0–36.0)
MCV: 90.2 fL (ref 78.0–100.0)
Monocytes Absolute: 0 10*3/uL — ABNORMAL LOW (ref 0.1–1.0)
Monocytes Relative: 0 % — ABNORMAL LOW (ref 3–12)
NEUTROS PCT: 0 % — AB (ref 43–77)
Neutro Abs: 0 10*3/uL — ABNORMAL LOW (ref 1.7–7.7)
PLATELETS: 29 10*3/uL — AB (ref 150–400)
RBC: 3.15 MIL/uL — AB (ref 4.22–5.81)
RDW: 12.2 % (ref 11.5–15.5)
WBC: 0.1 10*3/uL — CL (ref 4.0–10.5)

## 2013-05-27 LAB — ABO/RH: ABO/RH(D): A POS

## 2013-05-27 LAB — TYPE AND SCREEN
ABO/RH(D): A POS
Antibody Screen: NEGATIVE

## 2013-05-27 MED ORDER — TBO-FILGRASTIM 480 MCG/0.8ML ~~LOC~~ SOSY
480.0000 ug | PREFILLED_SYRINGE | Freq: Once | SUBCUTANEOUS | Status: AC
  Administered 2013-05-27: 480 ug via SUBCUTANEOUS
  Filled 2013-05-27: qty 0.8

## 2013-05-27 MED ORDER — LEVOFLOXACIN IN D5W 750 MG/150ML IV SOLN
750.0000 mg | Freq: Once | INTRAVENOUS | Status: AC
  Administered 2013-05-27: 750 mg via INTRAVENOUS
  Filled 2013-05-27: qty 150

## 2013-05-27 MED ORDER — LEVOFLOXACIN IN D5W 750 MG/150ML IV SOLN
750.0000 mg | INTRAVENOUS | Status: DC
  Administered 2013-05-28 – 2013-05-30 (×3): 750 mg via INTRAVENOUS
  Filled 2013-05-27 (×4): qty 150

## 2013-05-27 NOTE — Progress Notes (Signed)
I was contacted by the nursing staff to evaluate this patient with a fever of 101.5. The patient is also mildly hypotensive. Earlier this year, he had a large bowel movement which appears black in color. The patient still had persistent esophagitis. He still had intermittent hemoptysis. I saw the patient and evaluated him. He is in good spirits. I recommend going urine culture, blood culture and chest x-ray. I will consult inpatient pharmacist fall levofloxacin dosing. The patient does not have a central line and I hope to avoid giving him vancomycin that could cause further drop in his platelet count. I would also hope to avoid cefepime if possible. I have started him on Granix 480 mcg daily until his [place] is greater than 1500. I will increase his IV fluids from 50 cc per hour to 100 cc per hour due to hypotension. I suspect the patient may have melena and I will proceed to give him 1 unit of platelet transfusion. We discussed some of the risks, benefits, and alternatives of platelets transfusions. The patient is symptomatic from low platelet counts with bleeding and the platelet count is critically low.  Some of the side-effects to be expected including risks of transfusion reactions, chills, infection, syndrome of volume overload and risk of hospitalization from various reasons and the patient is willing to proceed and went ahead to sign consent today.

## 2013-05-27 NOTE — Progress Notes (Signed)
CRITICAL VALUE ALERT  Critical value received:  Platelets 29, WBC 0.1  Date of notification:  05/27/13  Time of notification:  0510  Critical value read back: yes  Nurse who received alert:  Wilhemina Cash  MD notified (1st page):  [facility]-call [facility]  Time of first page:  0513  MD notified (2nd page):  Time of second page:  Responding MD:  Alen Blew  Time MD responded:  [id number], no new orders

## 2013-05-27 NOTE — Progress Notes (Signed)
Ricky Villanueva   DOB:17-Jun-1950   KG#:[id number]    Subjective: He still complaining of severe esophagitis. Overall, his symptoms is a little bit better compared to yesterday. He still has mild hemoptysis on a regular basis. He described as scant pinkish fluid mixed with mucus. He denies any fevers or chills. He still has no bowel movement since Sunday. He denies any nausea. He feels hungry. He is able to drink liquids without severe pain.  Objective:  Filed Vitals:   05/27/13 0615  BP: 123/74  Pulse: 84  Temp: 99.1 F (37.3 C)  Resp:      Intake/Output Summary (Last 24 hours) at 05/27/13 0803 Last data filed at 05/27/13 7628  Gross per 24 hour  Intake   2025 ml  Output   2700 ml  Net   -675 ml    GENERAL:alert, no distress and comfortable. He looks thin and cachectic SKIN: skin color, texture, turgor are normal, no rashes or significant lesions. No petechiae rash EYES: normal, Conjunctiva are pink and non-injected, sclera clear OROPHARYNX:no exudate, no erythema and lips, buccal mucosa, and tongue normal . Dry mucous membranes NECK: supple, thyroid normal size, non-tender, without nodularity LYMPH:  no palpable lymphadenopathy in the cervical, axillary or inguinal LUNGS: clear to auscultation and percussion with normal breathing effort HEART: regular rate & rhythm and no murmurs and no lower extremity edema ABDOMEN:abdomen soft, non-tender and normal bowel sounds Musculoskeletal:no cyanosis of digits and no clubbing  NEURO: alert & oriented x 3 with fluent speech, no focal motor/sensory deficits. He has significant hearing deficits   Labs:  Lab Results  Component Value Date   WBC 0.1* 05/27/2013   HGB 9.5* 05/27/2013   HCT 28.4* 05/27/2013   MCV 90.2 05/27/2013   PLT 29* 05/27/2013   NEUTROABS 0.0* 05/27/2013    Lab Results  Component Value Date   NA 135* 05/26/2013   K 5.2 05/26/2013   CL 103 05/26/2013   CO2 25 05/26/2013   Assessment & Plan:  #1 Extensive small cell lung cancer   Continue supportive care. He had received cycle 1 of treatment recently   #2 Severe mucositis and esophagitis  Maalox was scheduled around the clock and proton pump inhibitors were continued. Dilaudid IV prn was started. Mucositis is improving. However his esophagitis is still present, source of his main complaint. I will start him on Gelclair to relieve the pain. I also recommend MS Contin twice a day.  #3 Severe dehydration with acute renal failure  Aggressive IVF were initiated and  his anti-hypertensive medications were stopped on admission. His oral intake is improving and I plan to continue the IV fluids to 50 cc an hour.  #4 Leukopenia  This is likely due to recent treatment. The patient denies recent history of fevers, cough, chills, diarrhea or dysuria. He is asymptomatic from the leukopenia. His Neulasta 6 mg injection was received on 3/28. I will observe for now and if the patient becomes symptomatic with fevers or signs of infection, I will consider starting him on G-CSF while in hospital. Repeat CBC in a.m. Neutropenic precautions recommended.  #5 Thrombocytopenia  This is likely due to recent treatment. He had daily hemoptysis of small amount. Today the platelet count is 21,000. I will stop his anticoagulation therapy as I expected his platelet count will continue to drop over the next few days. I recommend platelet transfusion if his hemoptysis is worse or if platelet count dropped to less than 10,000.  #6 Bilateral PE  Xarelto was changed to daily dosing. I will hold this due to low platelets. I will order SCD.  #7 Hypoxemia  Likely related to his lung cancer and PE. Continue on oxygen   #8 Weight loss, protein calorie malnutrition  Appreciate nutritionist consultation. On full liquid diet for now with Resource Breeze po BID and Unjury Chicken Broth BID as tolerated due to severe esophagitis. The patient is hungry. I recommend advancing his diet to soft diet as  tolerated.  #9 Constipation.  The patient's last bowel movement was on Sunday. Likely cause is narcotic need, for pain control. I will add lactulose and stool softener. Patient was on Miralax daily without success. Enema avoided due to neutropenia.   #10 Discharge planning Due to progressive worsening pancytopenia, I anticipate the patient will stay here through the weekend.  #11 CODE status Full code  #12 Hearing loss Likely due to cisplatin. Consider changing future chemotherapy to Carboplatin  Ricky Labo, MD 05/27/2013  8:03 AM

## 2013-05-27 NOTE — Progress Notes (Signed)
ANTIBIOTIC CONSULT NOTE - INITIAL  Pharmacy Consult for levofloxacin Indication: febrile neutropenia  Allergies  Allergen Reactions  . Augmentin [Amoxicillin-Pot Clavulanate] Itching and Rash    rash    Patient Measurements: Height: 5\' 7"  (170.2 cm) Weight: 113 lb 12 oz (51.597 kg) IBW/kg (Calculated) : 66.1  Vital Signs: Temp: 101 F (38.3 C) (04/03 1500) Temp src: Oral (04/03 1500) BP: 112/56 mmHg (04/03 1500) Pulse Rate: 123 (04/03 1500) Intake/Output from previous day: 04/02 0701 - 04/03 0700 In: 2025 [P.O.:120; I.V.:1905] Out: 2700 [Urine:2700] Intake/Output from this shift: Total I/O In: 400 [I.V.:400] Out: 451 [Urine:450; Stool:1]  Medical History: Past Medical History  Diagnosis Date  . Hyperlipidemia   . Hypertension   . GERD (gastroesophageal reflux disease)   . Respiratory failure with hypoxia 04/21/2013    secondary to pneumonia/notes 04/21/2013  . Pulmonary embolism     "got one in there now" (04/21/2013)  . COPD (chronic obstructive pulmonary disease)   . Pneumonia 2/?01/2014    "wouldn't get better; hospitalized 04/21/2013)  . Arthritis     "minor in my right hand" (04/21/2013)    Medications:  Scheduled:  . alum & mag hydroxide-simeth  60 mL Oral TID  . docusate sodium  100 mg Oral BID  . famotidine  20 mg Oral QHS  . feeding supplement (RESOURCE BREEZE)  1 Container Oral BID BM  . lactulose  10 g Oral BID  . levofloxacin (LEVAQUIN) IV  750 mg Intravenous Once  . LORazepam  0.5 mg Oral BID  . morphine  15 mg Oral Q12H  . mucosal barrier oral  1 packet Oral TID  . pantoprazole  40 mg Oral Daily  . polyethylene glycol  17 g Oral Daily  . Tbo-filgastrim (GRANIX) SQ  480 mcg Subcutaneous ONCE-1800  . tiotropium  18 mcg Inhalation Daily   Infusions:  . sodium chloride 1,000 mL (05/27/13 1210)   Assessment: 71 yoM recently diagnosed with extensive stage SCLC admitted 4/1 with severe mucositis, esophagitis, dehydration due to poor oral intake,  weight loss and uncontrolled pain. Patient received cycle 1 of cisplatin d1 / etoposide d1-3 on 3/25 - 3/27. Patient started noticing mouth blisters on 3/31. Patient was neutropenic on admission but without fevers. Patient with a new fever on 4/3 and Pharmacy has been consulted to dose levofloxacin for febrile neutropenia. The patient has been started on GCSF. The patient is also noted to have recent courses of cefdinir, clarithromycin, and levofloxacin in the past month.  Antiinfectives 4/3 >> levofloxacin >>   Labs/ vitals Tmax: 101 (on APAP) WBCs: 0.1 (ANC 0.0) Renal: Scr 1.14 (baseline ~0.1), CrCl 49 ml/min CG, 69 ml/min normlaized  Microbiology 4/3 blood x2: ordered 4/3 urine: ordered  Goal of Therapy:  appropriate levofloxacin dosing for renal function and indication  Plan:  - levofloxacin 750mg  IV q24h - follow-up renal function, clinical course, fever curve, WBC - follow-up when patient can tolerate PO formulation based on healing of mucositis/esophagitis - follow-up antibiotic length of therapy  Thank you for the consult.  Johny Drilling, PharmD, BCPS Pager: [phone] Pharmacy: [phone] 05/27/2013 4:17 PM

## 2013-05-27 NOTE — Clinical Documentation Improvement (Signed)
Possible Clinical Conditions? Chemotherapy Induced Pancytopenia Pancytopenia Other Condition Cannot Clinically Determine   Supporting Information: Risk Factors:(As per notes)"Pt had systemic chemotherapy a week ago." Diagnostics:Labs 05/27/13 WBC 4.0 - 10.5 K/uL  0.1 (LL)   RBC 4.22 - 5.81 MIL/uL  3.15 (L)   Hemoglobin 13.0 - 17.0 g/dL  9.5 (L)   HCT 39.0 - 52.0 %  28.4 (L)   Platelets 150 - 400 K/uL  29 (LL)    Thank You, Alessandra Grout, RN, BSN, CCDS, Clinical Documentation Specialist:  [phone]   Cell=[phone] [facility] Information Management

## 2013-05-27 NOTE — Progress Notes (Signed)
I notified Dr. Alvy Bimler of patient v/s,temp of 101.5. Patient comfortable at this time, no c/o pain,fluids encouraged. 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 - Sandie Ano RN

## 2013-05-28 DIAGNOSIS — D709 Neutropenia, unspecified: Secondary | ICD-10-CM

## 2013-05-28 DIAGNOSIS — R5081 Fever presenting with conditions classified elsewhere: Secondary | ICD-10-CM

## 2013-05-28 DIAGNOSIS — R109 Unspecified abdominal pain: Secondary | ICD-10-CM

## 2013-05-28 DIAGNOSIS — R070 Pain in throat: Secondary | ICD-10-CM

## 2013-05-28 LAB — BASIC METABOLIC PANEL
BUN: 15 mg/dL (ref 6–23)
CO2: 26 mEq/L (ref 19–32)
CREATININE: 1.05 mg/dL (ref 0.50–1.35)
Calcium: 8.4 mg/dL (ref 8.4–10.5)
Chloride: 103 mEq/L (ref 96–112)
GFR, EST AFRICAN AMERICAN: 87 mL/min — AB (ref >90)
GFR, EST NON AFRICAN AMERICAN: 75 mL/min — AB (ref >90)
GLUCOSE: 97 mg/dL (ref 70–99)
Potassium: 4.1 mEq/L (ref 3.7–5.3)
Sodium: 136 mEq/L — ABNORMAL LOW (ref 137–147)

## 2013-05-28 LAB — CBC WITH DIFFERENTIAL/PLATELET
BASOS ABS: 0 10*3/uL (ref 0.0–0.1)
BASOS PCT: 0 % (ref 0–1)
EOS ABS: 0 10*3/uL (ref 0.0–0.7)
Eosinophils Relative: 18 % — ABNORMAL HIGH (ref 0–5)
HCT: 26.2 % — ABNORMAL LOW (ref 39.0–52.0)
HEMOGLOBIN: 8.9 g/dL — AB (ref 13.0–17.0)
LYMPHS PCT: 64 % — AB (ref 12–46)
Lymphs Abs: 0.1 10*3/uL — ABNORMAL LOW (ref 0.7–4.0)
MCH: 30.4 pg (ref 26.0–34.0)
MCHC: 34 g/dL (ref 30.0–36.0)
MCV: 89.4 fL (ref 78.0–100.0)
MONO ABS: 0 10*3/uL — AB (ref 0.1–1.0)
Monocytes Relative: 0 % — ABNORMAL LOW (ref 3–12)
Neutro Abs: 0 10*3/uL — ABNORMAL LOW (ref 1.7–7.7)
Neutrophils Relative %: 18 % — ABNORMAL LOW (ref 43–77)
Platelets: 48 10*3/uL — ABNORMAL LOW (ref 150–400)
RBC: 2.93 MIL/uL — ABNORMAL LOW (ref 4.22–5.81)
RDW: 12.1 % (ref 11.5–15.5)
WBC: 0.1 10*3/uL — CL (ref 4.0–10.5)

## 2013-05-28 LAB — URINE CULTURE
Colony Count: NO GROWTH
Culture: NO GROWTH

## 2013-05-28 MED ORDER — TBO-FILGRASTIM 480 MCG/0.8ML ~~LOC~~ SOSY
480.0000 ug | PREFILLED_SYRINGE | Freq: Every day | SUBCUTANEOUS | Status: AC
Start: 2013-05-28 — End: 2013-05-30
  Administered 2013-05-28 – 2013-05-30 (×3): 480 ug via SUBCUTANEOUS
  Filled 2013-05-28 (×3): qty 0.8

## 2013-05-28 MED ORDER — SODIUM CHLORIDE 0.9 % IV SOLN
250.0000 mg | Freq: Four times a day (QID) | INTRAVENOUS | Status: DC
  Administered 2013-05-28 – 2013-05-31 (×12): 250 mg via INTRAVENOUS
  Filled 2013-05-28 (×13): qty 250

## 2013-05-28 MED ORDER — MORPHINE SULFATE ER 30 MG PO TBCR
30.0000 mg | EXTENDED_RELEASE_TABLET | Freq: Two times a day (BID) | ORAL | Status: DC
  Administered 2013-05-28 – 2013-05-31 (×7): 30 mg via ORAL
  Filled 2013-05-28 (×7): qty 1

## 2013-05-28 MED ORDER — PANTOPRAZOLE SODIUM 40 MG PO TBEC
40.0000 mg | DELAYED_RELEASE_TABLET | Freq: Two times a day (BID) | ORAL | Status: DC
  Administered 2013-05-28 – 2013-05-31 (×7): 40 mg via ORAL
  Filled 2013-05-28 (×9): qty 1

## 2013-05-28 MED ORDER — SUCRALFATE 1 GM/10ML PO SUSP
1.0000 g | Freq: Three times a day (TID) | ORAL | Status: DC
  Administered 2013-05-28 – 2013-05-31 (×13): 1 g via ORAL
  Filled 2013-05-28 (×16): qty 10

## 2013-05-28 NOTE — Progress Notes (Signed)
Found patient on 24% venti mask at 2lpm. O2 sat 87%. Increased to 31% venti mask at 4lpm and O2 sat increased to 92-94%. Patient comfortable.

## 2013-05-28 NOTE — Progress Notes (Signed)
ANTIBIOTIC CONSULT NOTE - INITIAL  Pharmacy Consult for Primaxin and Levofloxacin Indication: febrile neutropenia  Allergies  Allergen Reactions  . Augmentin [Amoxicillin-Pot Clavulanate] Itching and Rash    rash    Patient Measurements: Height: 5\' 7"  (170.2 cm) Weight: 113 lb 12 oz (51.597 kg) IBW/kg (Calculated) : 66.1  Vital Signs: Temp: 98.2 F (36.8 C) (04/04 0549) Temp src: Oral (04/04 0549) BP: 97/58 mmHg (04/04 0549) Pulse Rate: 95 (04/04 0549) Intake/Output from previous day: 04/03 0701 - 04/04 0700 In: 472.5 [P.O.:60; I.V.:400; Blood:12.5] Out: 851 [Urine:850; Stool:1] Intake/Output from this shift:    Medical History: Past Medical History  Diagnosis Date  . Hyperlipidemia   . Hypertension   . GERD (gastroesophageal reflux disease)   . Respiratory failure with hypoxia 04/21/2013    secondary to pneumonia/notes 04/21/2013  . Pulmonary embolism     "got one in there now" (04/21/2013)  . COPD (chronic obstructive pulmonary disease)   . Pneumonia 2/?01/2014    "wouldn't get better; hospitalized 04/21/2013)  . Arthritis     "minor in my right hand" (04/21/2013)    Medications:  Scheduled:  . alum & mag hydroxide-simeth  60 mL Oral TID  . docusate sodium  100 mg Oral BID  . famotidine  20 mg Oral QHS  . feeding supplement (RESOURCE BREEZE)  1 Container Oral BID BM  . imipenem-cilastatin  250 mg Intravenous 4 times per day  . lactulose  10 g Oral BID  . levofloxacin (LEVAQUIN) IV  750 mg Intravenous Q24H  . LORazepam  0.5 mg Oral BID  . morphine  30 mg Oral Q12H  . mucosal barrier oral  1 packet Oral TID  . pantoprazole  40 mg Oral BID  . polyethylene glycol  17 g Oral Daily  . sucralfate  1 g Oral TID WC & HS  . Tbo-filgastrim (GRANIX) SQ  480 mcg Subcutaneous q1800  . tiotropium  18 mcg Inhalation Daily   Infusions:  . sodium chloride 100 mL/hr at 05/28/13 0329   Assessment: 68 yoM recently diagnosed with extensive stage SCLC admitted 4/1 with  severe mucositis, esophagitis, dehydration due to poor oral intake, weight loss and uncontrolled pain. Patient received cycle 1 of cisplatin d1 / etoposide d1-3 on 3/25 - 3/27. Patient started noticing mouth blisters on 3/31. Patient was neutropenic on admission but without fevers. Patient with a new fever on 4/3 and Pharmacy has been consulted to dose levofloxacin for febrile neutropenia. The patient has been started on GCSF. The patient is also noted to have recent courses of cefdinir, clarithromycin, and levofloxacin in the past month. Primaxin per pharmacy added 4/4 by Dr. Jana Hakim.  Antiinfectives 4/3 >> Levaquin >> 4/4 >> Primaxin >>   Labs/ vitals Tmax: afebrile (on APAP) WBCs: 0.1 (ANC 0.0) Renal: Scr 1.05 (baseline ~1), CrCl 51ml/min CG, 61ml/min normalized  Microbiology 4/3 blood x2: sent 4/3 urine: sent  Goal of Therapy:  Appropriate antibiotic dosing for renal function; eradication of infection  Plan:   Cont Levaquin 750mg  IV q24h.  Start Primaxin 250mg  IV q6h.  Follow-up renal function, clinical course, fever curve, WBC.  Romeo Rabon, PharmD, pager [phone]. 05/28/2013,9:31 AM.

## 2013-05-28 NOTE — Progress Notes (Signed)
Ricky Villanueva   DOB:May 10, 1950   JJ#:[id number]   GYJ#:[id number]  Subjective: aler in bed, wife in room; c/o mild-mod pain in throat, mod-huigh pain in epigastrium; discussed pain meds and feels "a little more would help;" daily BMs are stringy and black, no BRBPR, no epistaxis, no hemoptysis; temp yesterday to 101   Objective: midle aged White man exmained in room Filed Vitals:   05/28/13 0549  BP: 97/58  Pulse: 95  Temp: 98.2 F (36.8 C)  Resp: 16    Body mass index is 17.81 kg/(m^2).  Intake/Output Summary (Last 24 hours) at 05/28/13 0842 Last data filed at 05/28/13 0329  Gross per 24 hour  Intake  472.5 ml  Output    850 ml  Net -377.5 ml     Sclerae unicteric  Oropharynx no obvious lesins  No peripheral adenopathy  Lungs -- no rales or rhonchi  Heart regular rate and rhythm  Abdomen soft, +BS, NT  MSK no peripheral edema  Neuro nonfocal, well oriented, appropriate affect    CBG (last 3)  No results found for this basename: GLUCAP,  in the last 72 hours   Labs:  Lab Results  Component Value Date   WBC 0.1* 05/28/2013   HGB 8.9* 05/28/2013   HCT 26.2* 05/28/2013   MCV 89.4 05/28/2013   PLT 48* 05/28/2013   NEUTROABS 0.0* 05/28/2013    @LASTCHEMISTRY @  Urine Studies No results found for this basename: UACOL, UAPR, USPG, UPH, UTP, UGL, UKET, UBIL, UHGB, UNIT, UROB, ULEU, UEPI, UWBC, URBC, UBAC, CAST, CRYS, UCOM, BILUA,  in the last 72 hours  Basic Metabolic Panel:  Recent Labs Lab 05/25/13 0929 05/26/13 0405 05/28/13 0416  NA 136 135* 136*  K 5.3* 5.2 4.1  CL  --  103 103  CO2 24 25 26   GLUCOSE 155* 117* 97  BUN 39.1* 34* 15  CREATININE 1.5* 1.14 1.05  CALCIUM 9.8 8.3* 8.4  MG  --  1.8  --    GFR Estimated Creatinine Clearance: 53.9 ml/min (by C-G formula based on Cr of 1.05). Liver Function Tests:  Recent Labs Lab 05/26/13 0405  AST 12  ALT 12  ALKPHOS 70  BILITOT 0.5  PROT 5.1*  ALBUMIN 2.5*   No results found for this basename: LIPASE,  AMYLASE,  in the last 168 hours No results found for this basename: AMMONIA,  in the last 168 hours Coagulation profile No results found for this basename: INR, PROTIME,  in the last 168 hours  CBC:  Recent Labs Lab 05/25/13 0928 05/26/13 0405 05/27/13 0354 05/28/13 0416  WBC 3.7* 0.3* 0.1* 0.1*  NEUTROABS 3.2 0.2* 0.0* 0.0*  HGB 13.9 10.7* 9.5* 8.9*  HCT 42.5 32.3* 28.4* 26.2*  MCV 93.2 91.5 90.2 89.4  PLT 90* 51* 29* 48*   Cardiac Enzymes: No results found for this basename: CKTOTAL, CKMB, CKMBINDEX, TROPONINI,  in the last 168 hours BNP: No components found with this basename: POCBNP,  CBG: No results found for this basename: GLUCAP,  in the last 168 hours D-Dimer No results found for this basename: DDIMER,  in the last 72 hours Hgb A1c No results found for this basename: HGBA1C,  in the last 72 hours Lipid Profile No results found for this basename: CHOL, HDL, LDLCALC, TRIG, CHOLHDL, LDLDIRECT,  in the last 72 hours Thyroid function studies No results found for this basename: TSH, T4TOTAL, FREET3, T3FREE, THYROIDAB,  in the last 72 hours Anemia work up No results found for this basename:  VITAMINB12, FOLATE, FERRITIN, TIBC, IRON, RETICCTPCT,  in the last 72 hours Microbiology No results found for this or any previous visit (from the past 240 hour(s)).    Studies:  Dg Chest 2 View  05/27/2013   CLINICAL DATA:  Fever, neutropenia, cough  EXAM: CHEST  2 VIEW  COMPARISON:  CT ANGIO CHEST W/CM &/OR WO/CM dated 04/21/2013; DG CHEST 2 VIEW dated 04/21/2013  FINDINGS: Cardiac silhouette within normal limits. Decreased conspicuity of the right lower lobe infiltrate. Linear appearing increased density left lung base. Minimal blunting of the left costophrenic angle. No further focal regions of consolidation or focal infiltrates. Osseous structures unremarkable.  IMPRESSION: Improving right lower lobe infiltrate. Continued surveillance evaluation recommended.  Atelectasis versus mild  infiltrate left lung base.   Electronically Signed   By: Margaree Mackintosh M.D.   On: 05/27/2013 16:51    Assessment: 63 y.o. [name] man with small-cell lung cancer, extensive stage, currently day 11 cycle 1 cis-platinum/etoposide, admitted with febrile neutropenia, mucositis, pancytopenia, history bilateral PE, pain  Plan:  (1) fever/ neutropenia: will add imipenem to levaquin, cultures negative to date-- follow  (2) pulmonary emboli with no evidence of DVT by dopplers 04/24/2013-- will continue to hold anticoagulation in light of thrombocytopenia and melena-- consider prophylactic dose lovenox if improved tomorrow  (3) pain-- will increase baseline morphine, add carafate, continue bowel prophylaxis  (4) full code   Chauncey Cruel, MD 05/28/2013  8:42 AM

## 2013-05-29 ENCOUNTER — Encounter: Payer: Self-pay | Admitting: Internal Medicine

## 2013-05-29 LAB — CBC WITH DIFFERENTIAL/PLATELET
BASOS ABS: 0 10*3/uL (ref 0.0–0.1)
BASOS PCT: 4 % — AB (ref 0–1)
EOS ABS: 0 10*3/uL (ref 0.0–0.7)
Eosinophils Relative: 13 % — ABNORMAL HIGH (ref 0–5)
HCT: 25.3 % — ABNORMAL LOW (ref 39.0–52.0)
HEMOGLOBIN: 8.7 g/dL — AB (ref 13.0–17.0)
Lymphocytes Relative: 39 % (ref 12–46)
Lymphs Abs: 0.1 10*3/uL — ABNORMAL LOW (ref 0.7–4.0)
MCH: 30.6 pg (ref 26.0–34.0)
MCHC: 34.4 g/dL (ref 30.0–36.0)
MCV: 89.1 fL (ref 78.0–100.0)
MONO ABS: 0.1 10*3/uL (ref 0.1–1.0)
Monocytes Relative: 26 % — ABNORMAL HIGH (ref 3–12)
NEUTROS ABS: 0 10*3/uL — AB (ref 1.7–7.7)
Neutrophils Relative %: 18 % — ABNORMAL LOW (ref 43–77)
Platelets: 35 10*3/uL — ABNORMAL LOW (ref 150–400)
RBC: 2.84 MIL/uL — ABNORMAL LOW (ref 4.22–5.81)
RDW: 12.1 % (ref 11.5–15.5)
WBC: 0.2 10*3/uL — CL (ref 4.0–10.5)

## 2013-05-29 NOTE — Progress Notes (Signed)
Ricky Villanueva   DOB:1950-04-06   UR#:[id number]   EGB#:[id number]  Subjective: looks better, moves more easily, speech clearer; 2 small BMs this AM, "green and black;" no h/a, N/V, cough or phlegm but continuing SOB w activity and desats when OOB to BR; more comfortable than yesterday; wants his O2 at 2L "NOT at 4L";wife in room  Objective: midle aged White man exmained in bed  Filed Vitals:   05/29/13 0457  BP: 108/67  Pulse: 99  Temp: 98.7 F (37.1 C)  Resp: 16    Body mass index is 17.81 kg/(m^2).  Intake/Output Summary (Last 24 hours) at 05/29/13 0907 Last data filed at 05/29/13 0500  Gross per 24 hour  Intake      0 ml  Output    425 ml  Net   -425 ml  (I/O llikely incorrect)   Sclerae unicteric, EOMs intact  Oropharynx dry, no obvious lesins  No peripheral adenopathy  Lungs -- do not hear rales at R base  Heart regular rate and rhythm  Abdomen soft, +BS, NT  MSK no peripheral edema  Neuro nonfocal, well oriented, appropriate affect    CBG (last 3)  No results found for this basename: GLUCAP,  in the last 72 hours   Labs:  Lab Results  Component Value Date   WBC 0.2* 05/29/2013   HGB 8.7* 05/29/2013   HCT 25.3* 05/29/2013   MCV 89.1 05/29/2013   PLT 35* 05/29/2013   NEUTROABS 0.0* 05/29/2013    @LASTCHEMISTRY @  Urine Studies No results found for this basename: UACOL, UAPR, USPG, UPH, UTP, UGL, UKET, UBIL, UHGB, UNIT, UROB, ULEU, UEPI, UWBC, URBC, UBAC, CAST, CRYS, UCOM, BILUA,  in the last 72 hours  Basic Metabolic Panel:  Recent Labs Lab 05/25/13 0929 05/26/13 0405 05/28/13 0416  NA 136 135* 136*  K 5.3* 5.2 4.1  CL  --  103 103  CO2 24 25 26   GLUCOSE 155* 117* 97  BUN 39.1* 34* 15  CREATININE 1.5* 1.14 1.05  CALCIUM 9.8 8.3* 8.4  MG  --  1.8  --    GFR Estimated Creatinine Clearance: 53.9 ml/min (by C-G formula based on Cr of 1.05). Liver Function Tests:  Recent Labs Lab 05/26/13 0405  AST 12  ALT 12  ALKPHOS 70  BILITOT 0.5  PROT 5.1*  ALBUMIN  2.5*   No results found for this basename: LIPASE, AMYLASE,  in the last 168 hours No results found for this basename: AMMONIA,  in the last 168 hours Coagulation profile No results found for this basename: INR, PROTIME,  in the last 168 hours  CBC:  Recent Labs Lab 05/25/13 0928 05/26/13 0405 05/27/13 0354 05/28/13 0416 05/29/13 0357  WBC 3.7* 0.3* 0.1* 0.1* 0.2*  NEUTROABS 3.2 0.2* 0.0* 0.0* 0.0*  HGB 13.9 10.7* 9.5* 8.9* 8.7*  HCT 42.5 32.3* 28.4* 26.2* 25.3*  MCV 93.2 91.5 90.2 89.4 89.1  PLT 90* 51* 29* 48* 35*   Cardiac Enzymes: No results found for this basename: CKTOTAL, CKMB, CKMBINDEX, TROPONINI,  in the last 168 hours BNP: No components found with this basename: POCBNP,  CBG: No results found for this basename: GLUCAP,  in the last 168 hours D-Dimer No results found for this basename: DDIMER,  in the last 72 hours Hgb A1c No results found for this basename: HGBA1C,  in the last 72 hours Lipid Profile No results found for this basename: CHOL, HDL, LDLCALC, TRIG, CHOLHDL, LDLDIRECT,  in the last 72 hours Thyroid function  studies No results found for this basename: TSH, T4TOTAL, FREET3, T3FREE, THYROIDAB,  in the last 72 hours Anemia work up No results found for this basename: VITAMINB12, FOLATE, FERRITIN, TIBC, IRON, RETICCTPCT,  in the last 72 hours Microbiology Recent Results (from the past 240 hour(s))  CULTURE, BLOOD (ROUTINE X 2)     Status: None   Collection Time    05/27/13  3:05 PM      Result Value Ref Range Status   Specimen Description BLOOD RIGHT ANTECUBITAL   Final   Special Requests BOTTLES DRAWN AEROBIC ONLY 5CC    Final   Culture  Setup Time     Final   Value: 05/27/2013 19:25     Performed at [facility]   Culture     Final   Value:        BLOOD CULTURE RECEIVED NO GROWTH TO DATE CULTURE WILL BE HELD FOR 5 DAYS BEFORE ISSUING A FINAL NEGATIVE REPORT     Performed at [facility]   Report Status PENDING   Incomplete   CULTURE, BLOOD (ROUTINE X 2)     Status: None   Collection Time    05/27/13  4:10 PM      Result Value Ref Range Status   Specimen Description BLOOD RIGHT WRIST   Final   Special Requests BOTTLES DRAWN AEROBIC AND ANAEROBIC 5CC   Final   Culture  Setup Time     Final   Value: 05/27/2013 19:25     Performed at [facility]   Culture     Final   Value:        BLOOD CULTURE RECEIVED NO GROWTH TO DATE CULTURE WILL BE HELD FOR 5 DAYS BEFORE ISSUING A FINAL NEGATIVE REPORT     Performed at [facility]   Report Status PENDING   Incomplete  URINE CULTURE     Status: None   Collection Time    05/27/13  6:02 PM      Result Value Ref Range Status   Specimen Description URINE, CLEAN CATCH   Final   Special Requests NONE   Final   Culture  Setup Time     Final   Value: 05/27/2013 21:30     Performed at [facility]     Final   Value: NO GROWTH     Performed at [facility]   Culture     Final   Value: NO GROWTH     Performed at [facility]   Report Status 05/28/2013 FINAL   Final      Studies:  Dg Chest 2 View  05/27/2013   CLINICAL DATA:  Fever, neutropenia, cough  EXAM: CHEST  2 VIEW  COMPARISON:  CT ANGIO CHEST W/CM &/OR WO/CM dated 04/21/2013; DG CHEST 2 VIEW dated 04/21/2013  FINDINGS: Cardiac silhouette within normal limits. Decreased conspicuity of the right lower lobe infiltrate. Linear appearing increased density left lung base. Minimal blunting of the left costophrenic angle. No further focal regions of consolidation or focal infiltrates. Osseous structures unremarkable.  IMPRESSION: Improving right lower lobe infiltrate. Continued surveillance evaluation recommended.  Atelectasis versus mild infiltrate left lung base.   Electronically Signed   By: Margaree Mackintosh M.D.   On: 05/27/2013 16:51    Assessment: 63 y.o. [name] man with small-cell lung cancer, extensive stage, currently day 12 cycle 1 cis-platinum/etoposide,  complicated by febrile neutropenia, mucositis, pancytopenia, history bilateral PE, pain  Plan:  (  1) fever/ neutropenia: day 3 levofloxacin, day 2 imipenem-- AF past 24h+, blood and urine cultures negative  (2) pulmonary emboli with no evidence of DVT by dopplers 04/24/2013-- will continue to hold anticoagulation in light of thrombocytopenia and melena-- consider prophylactic dose lovenox when platelets >50K  (3) pain-- improved control with increased baseline morphine, carafate; continue bowel prophylaxis  (4) full code   Chauncey Cruel, MD 05/29/2013  9:07 AM

## 2013-05-30 ENCOUNTER — Other Ambulatory Visit: Payer: BC Managed Care – PPO

## 2013-05-30 DIAGNOSIS — J189 Pneumonia, unspecified organism: Secondary | ICD-10-CM

## 2013-05-30 DIAGNOSIS — C3490 Malignant neoplasm of unspecified part of unspecified bronchus or lung: Secondary | ICD-10-CM

## 2013-05-30 DIAGNOSIS — H9319 Tinnitus, unspecified ear: Secondary | ICD-10-CM

## 2013-05-30 DIAGNOSIS — D61818 Other pancytopenia: Secondary | ICD-10-CM

## 2013-05-30 LAB — CBC WITH DIFFERENTIAL/PLATELET
BASOS ABS: 0 10*3/uL (ref 0.0–0.1)
Basophils Relative: 1 % (ref 0–1)
Eosinophils Absolute: 0 10*3/uL (ref 0.0–0.7)
Eosinophils Relative: 2 % (ref 0–5)
HCT: 24.8 % — ABNORMAL LOW (ref 39.0–52.0)
Hemoglobin: 8.4 g/dL — ABNORMAL LOW (ref 13.0–17.0)
Lymphocytes Relative: 25 % (ref 12–46)
Lymphs Abs: 0.2 10*3/uL — ABNORMAL LOW (ref 0.7–4.0)
MCH: 30.4 pg (ref 26.0–34.0)
MCHC: 33.9 g/dL (ref 30.0–36.0)
MCV: 89.9 fL (ref 78.0–100.0)
MONO ABS: 0.2 10*3/uL (ref 0.1–1.0)
Monocytes Relative: 23 % — ABNORMAL HIGH (ref 3–12)
NEUTROS PCT: 49 % (ref 43–77)
Neutro Abs: 0.5 10*3/uL — ABNORMAL LOW (ref 1.7–7.7)
PLATELETS: 24 10*3/uL — AB (ref 150–400)
RBC: 2.76 MIL/uL — ABNORMAL LOW (ref 4.22–5.81)
RDW: 12.1 % (ref 11.5–15.5)
WBC: 0.9 10*3/uL — CL (ref 4.0–10.5)

## 2013-05-30 LAB — PREPARE PLATELET PHERESIS: UNIT DIVISION: 0

## 2013-05-30 MED ORDER — TBO-FILGRASTIM 480 MCG/0.8ML ~~LOC~~ SOSY
480.0000 ug | PREFILLED_SYRINGE | Freq: Every day | SUBCUTANEOUS | Status: DC
  Filled 2013-05-30 (×2): qty 0.8

## 2013-05-30 NOTE — Progress Notes (Signed)
Pt has poor appetite. Family at bedside

## 2013-05-30 NOTE — Progress Notes (Signed)
Ricky Villanueva   DOB:[birth year]   WF#:[id number]   UKG#:[id number]  Subjective: Reports ringing in his right ear that started recently.  "It hurts when I swallow" but this has improved over course of hospital.   On neutropenic precautions.  Nurse reports no acute overnight events noted.  Wife and son is at his bedside. Patient reports mild non-productive cough.   Objective:  Filed Vitals:   05/30/13 1309  BP: 138/75  Pulse: 84  Temp:   Resp: 16    Body mass index is 17.81 kg/(m^2).  Intake/Output Summary (Last 24 hours) at 05/30/13 1337 Last data filed at 05/30/13 1300  Gross per 24 hour  Intake   7225 ml  Output   1100 ml  Net   6125 ml    Left ear crusty with erythema  Laying in bed, mildly cachetic  Sclerae unicteric  Oropharynx  Mild mucositis  No peripheral adenopathy  Lungs diffuse rhonchi without wheezes  Heart regular rate and rhythm  Abdomen mild TTP in epigastric  MSK no peripheral edema  Neuro nonfocal  Labs:  Lab Results  Component Value Date   WBC 0.9* 05/30/2013   HGB 8.4* 05/30/2013   HCT 24.8* 05/30/2013   MCV 89.9 05/30/2013   PLT 24* 05/30/2013   NEUTROABS 0.5* 08/29/2829    Basic Metabolic Panel:  Recent Labs Lab 05/25/13 0929 05/26/13 0405 05/28/13 0416  NA 136 135* 136*  K 5.3* 5.2 4.1  CL  --  103 103  CO2 24 25 26   GLUCOSE 155* 117* 97  BUN 39.1* 34* 15  CREATININE 1.5* 1.14 1.05  CALCIUM 9.8 8.3* 8.4  MG  --  1.8  --    GFR Estimated Creatinine Clearance: 53.9 ml/min (by C-G formula based on Cr of 1.05). Liver Function Tests:  Recent Labs Lab 05/26/13 0405  AST 12  ALT 12  ALKPHOS 70  BILITOT 0.5  PROT 5.1*  ALBUMIN 2.5*    CBC:  Recent Labs Lab 05/26/13 0405 05/27/13 0354 05/28/13 0416 05/29/13 0357 05/30/13 0505  WBC 0.3* 0.1* 0.1* 0.2* 0.9*  NEUTROABS 0.2* 0.0* 0.0* 0.0* 0.5*  HGB 10.7* 9.5* 8.9* 8.7* 8.4*  HCT 32.3* 28.4* 26.2* 25.3* 24.8*  MCV 91.5 90.2 89.4 89.1 89.9  PLT 51* 29* 48* 35* 24*    Microbiology Recent Results (from the past 240 hour(s))  CULTURE, BLOOD (ROUTINE X 2)     Status: None   Collection Time    05/27/13  3:05 PM      Result Value Ref Range Status   Specimen Description BLOOD RIGHT ANTECUBITAL   Final   Special Requests BOTTLES DRAWN AEROBIC ONLY 5CC    Final   Culture  Setup Time     Final   Value: 05/27/2013 19:25     Performed at [facility]   Culture     Final   Value:        BLOOD CULTURE RECEIVED NO GROWTH TO DATE CULTURE WILL BE HELD FOR 5 DAYS BEFORE ISSUING A FINAL NEGATIVE REPORT     Performed at [facility]   Report Status PENDING   Incomplete  CULTURE, BLOOD (ROUTINE X 2)     Status: None   Collection Time    05/27/13  4:10 PM      Result Value Ref Range Status   Specimen Description BLOOD RIGHT WRIST   Final   Special Requests BOTTLES DRAWN AEROBIC AND ANAEROBIC 5CC   Final   Culture  Setup Time     Final   Value: 05/27/2013 19:25     Performed at [facility]   Culture     Final   Value:        BLOOD CULTURE RECEIVED NO GROWTH TO DATE CULTURE WILL BE HELD FOR 5 DAYS BEFORE ISSUING A FINAL NEGATIVE REPORT     Performed at [facility]   Report Status PENDING   Incomplete  URINE CULTURE     Status: None   Collection Time    05/27/13  6:02 PM      Result Value Ref Range Status   Specimen Description URINE, CLEAN CATCH   Final   Special Requests NONE   Final   Culture  Setup Time     Final   Value: 05/27/2013 21:30     Performed at [place]     Final   Value: NO GROWTH     Performed at [facility]   Culture     Final   Value: NO GROWTH     Performed at [facility]   Report Status 05/28/2013 FINAL   Final      Studies:  CLINICAL DATA: Fever, neutropenia, cough  EXAM:  CHEST 2 VIEW  COMPARISON: CT ANGIO CHEST W/CM &/OR WO/CM dated 04/21/2013; DG CHEST 2 VIEW dated 04/21/2013  FINDINGS: Cardiac silhouette within normal limits. Decreased  conspicuity of the right lower lobe infiltrate. Linear appearing increased density left lung base. Minimal blunting of the left costophrenic angle. No further focal regions of consolidation or focal infiltrates. Osseous  structures unremarkable. IMPRESSION: Improving right lower lobe infiltrate. Continued surveillance evaluation recommended. Atelectasis versus mild infiltrate left lung base.  Assessment: 63 y.o.  #1 Extensive small cell lung cancer  --Continue supportive care. He had received cycle 1 of treatment recently. Given above, we will discontinue cisplatin for next therapy and dose adjust etoposide.    #2 Severe mucositis and esophagitis  --Continue round the clock Maalox and proton pump inhibitor. --Continue mucosal barrier oral one packet tid. --Continue MS Contin 30 mg bid and oxycodone 5 mg IR prn and dilaudid 1 mg iv q 2 hours prn  --Continue Carafate 1 g tid  #3 Febrile neutropenia --Continue Tbo-Filgrastim 480 mcg daily at 1800 started on 04/04 until [place] greater than 1,500. [place] 500 today.  --continue Primaxin 250 mg every 6 hours and Levaquin 750 mg daily.  Started on 04/04 at 12 pm.  --CXR on 04/03 demonstrates improving right lower lobe infiltrate and atelectasis versus mild infiltrate left lung base.   #4. Severe dehydration with acute renal failure  Continue aggressive IVF with NS at 100 cc/hour and hold his anti-hypertensive medications   #5 Pancytopenia secondary to chemotherapy  --He is asymptomatic from the low platelet count. The patient denies recent history of bleeding such as epistaxis, hematuria or hematochezia. Since his platelet count dropped to less than 50,000, we held his anticoagulation therapy   #6 Bilateral PE  -- Xarelto to daily dosing being held secondary to #5.   #7 Hypoxemia  Likely related to his lung cancer and PE. Continue on oxygen   #8 Weight loss and malnutrition  Nutrition is following. Continue full liquid diet only due to severe  esophagitis.  Resource Breeze bid between meals.   #9. Hearing changes. --He has an outpatient audiometry on 4/23.  We will discontinue cisplatin dose.   #10. Diarrhea.  --Hold stool softners, lactulose and miralax for  diarrhea.   #10. Disposition. Full Code.  Patient will ready for discharge [place] is greater than 1,500, tolerating PO without intravenous pain medications, afebrile greater than 24 hours or oral antibiotics.     Quantavius Humm, MD 05/30/2013  1:37 PM

## 2013-05-30 NOTE — Progress Notes (Signed)
CRITICAL VALUE ALERT  Critical value received:  Platelet 24  Date of notification:  05/30/13  Time of notification:  0655  Critical value read back:yes  Nurse who received alert:Alim Cattell ,Liberty Handy   MD notified (1st page):  MAGRINAT   Time of first page:  0700  MD notified (2nd page):  Time of second page:  Responding MD: Jana Hakim  Time MD [id number]

## 2013-05-30 NOTE — Plan of Care (Signed)
Problem: Phase I Progression Outcomes Goal: OOB as tolerated unless otherwise ordered Outcome: Progressing Ambulating to bathroom. Pt asked to call for assistance when he needed to get up, he has been found still getting up by himself. Night nurse informed, family at bedside

## 2013-05-31 LAB — CBC WITH DIFFERENTIAL/PLATELET
Basophils Absolute: 0 10*3/uL (ref 0.0–0.1)
Basophils Relative: 1 % (ref 0–1)
Eosinophils Absolute: 0 10*3/uL (ref 0.0–0.7)
Eosinophils Relative: 1 % (ref 0–5)
HCT: 24.3 % — ABNORMAL LOW (ref 39.0–52.0)
Hemoglobin: 8.3 g/dL — ABNORMAL LOW (ref 13.0–17.0)
LYMPHS ABS: 0.5 10*3/uL — AB (ref 0.7–4.0)
Lymphocytes Relative: 10 % — ABNORMAL LOW (ref 12–46)
MCH: 30.2 pg (ref 26.0–34.0)
MCHC: 34.2 g/dL (ref 30.0–36.0)
MCV: 88.4 fL (ref 78.0–100.0)
MONO ABS: 0.7 10*3/uL (ref 0.1–1.0)
Monocytes Relative: 14 % — ABNORMAL HIGH (ref 3–12)
Neutro Abs: 3.6 10*3/uL (ref 1.7–7.7)
Neutrophils Relative %: 74 % (ref 43–77)
PLATELETS: 30 10*3/uL — AB (ref 150–400)
RBC: 2.75 MIL/uL — AB (ref 4.22–5.81)
RDW: 12.2 % (ref 11.5–15.5)
WBC Morphology: INCREASED
WBC: 4.8 10*3/uL (ref 4.0–10.5)

## 2013-05-31 LAB — BASIC METABOLIC PANEL
BUN: 8 mg/dL (ref 6–23)
CHLORIDE: 103 meq/L (ref 96–112)
CO2: 26 mEq/L (ref 19–32)
CREATININE: 0.83 mg/dL (ref 0.50–1.35)
Calcium: 8 mg/dL — ABNORMAL LOW (ref 8.4–10.5)
GFR calc Af Amer: >90 mL/min (ref >90)
GFR calc non Af Amer: >90 mL/min (ref >90)
Glucose, Bld: 79 mg/dL (ref 70–99)
Potassium: 3.7 mEq/L (ref 3.7–5.3)
Sodium: 138 mEq/L (ref 137–147)

## 2013-05-31 LAB — MAGNESIUM: Magnesium: 1.5 mg/dL (ref 1.5–2.5)

## 2013-05-31 MED ORDER — LEVOFLOXACIN 750 MG PO TABS
750.0000 mg | ORAL_TABLET | Freq: Every day | ORAL | Status: DC
  Administered 2013-05-31: 750 mg via ORAL
  Filled 2013-05-31 (×2): qty 1

## 2013-05-31 MED ORDER — BENECALORIE PO LIQD: 1.0000 | Freq: Two times a day (BID) | ORAL | Status: DC

## 2013-05-31 MED ORDER — DSS 100 MG PO CAPS: 100.0000 mg | ORAL_CAPSULE | Freq: Two times a day (BID) | ORAL | Status: DC

## 2013-05-31 MED ORDER — ALUM & MAG HYDROXIDE-SIMETH 200-200-20 MG/5ML PO SUSP: 60.0000 mL | Freq: Three times a day (TID) | ORAL | Status: DC

## 2013-05-31 MED ORDER — LEVOFLOXACIN 750 MG PO TABS: 750.0000 mg | ORAL_TABLET | Freq: Every day | ORAL | Status: DC

## 2013-05-31 MED ORDER — MORPHINE SULFATE ER 30 MG PO TBCR: 30.0000 mg | EXTENDED_RELEASE_TABLET | Freq: Two times a day (BID) | ORAL | Status: DC

## 2013-05-31 MED ORDER — SUCRALFATE 1 GM/10ML PO SUSP
1.0000 g | Freq: Three times a day (TID) | ORAL | Status: DC
Start: 2013-05-31 — End: 2013-06-08

## 2013-05-31 MED ORDER — OXYCODONE HCL 5 MG PO TABS: 5.0000 mg | ORAL_TABLET | ORAL | Status: DC | PRN

## 2013-05-31 NOTE — Progress Notes (Signed)
ANTIBIOTIC CONSULT NOTE - INITIAL  Pharmacy Consult for Primaxin and Levofloxacin Indication: febrile neutropenia  Allergies  Allergen Reactions  . Augmentin [Amoxicillin-Pot Clavulanate] Itching and Rash    rash    Patient Measurements: Height: 5\' 7"  (170.2 cm) Weight: 113 lb 12 oz (51.597 kg) IBW/kg (Calculated) : 66.1  Vital Signs: Temp: 97.8 F (36.6 C) (04/07 0540) Temp src: Oral (04/07 0540) BP: 121/76 mmHg (04/07 0540) Pulse Rate: 107 (04/07 0554) Intake/Output from previous day: 04/06 0701 - 04/07 0700 In: 3370 [P.O.:620; I.V.:2400; IV Piggyback:350] Out: 500 [Urine:500] Intake/Output from this shift:    Medical History: Past Medical History  Diagnosis Date  . Hyperlipidemia   . Hypertension   . GERD (gastroesophageal reflux disease)   . Respiratory failure with hypoxia 04/21/2013    secondary to pneumonia/notes 04/21/2013  . Pulmonary embolism     "got one in there now" (04/21/2013)  . COPD (chronic obstructive pulmonary disease)   . Pneumonia 2/?01/2014    "wouldn't get better; hospitalized 04/21/2013)  . Arthritis     "minor in my right hand" (04/21/2013)    Medications:  Scheduled:  . alum & mag hydroxide-simeth  60 mL Oral TID  . docusate sodium  100 mg Oral BID  . famotidine  20 mg Oral QHS  . feeding supplement (RESOURCE BREEZE)  1 Container Oral BID BM  . lactulose  10 g Oral BID  . levofloxacin (LEVAQUIN) IV  750 mg Intravenous Q24H  . LORazepam  0.5 mg Oral BID  . morphine  30 mg Oral Q12H  . mucosal barrier oral  1 packet Oral TID  . pantoprazole  40 mg Oral BID  . polyethylene glycol  17 g Oral Daily  . sucralfate  1 g Oral TID WC & HS  . tiotropium  18 mcg Inhalation Daily   Infusions:  . sodium chloride 100 mL/hr at 05/31/13 0256   Assessment: Ricky Villanueva recently diagnosed with extensive stage SCLC admitted 4/1 with severe mucositis, esophagitis, dehydration due to poor oral intake, weight loss and uncontrolled pain. Patient received  cycle 1 of cisplatin d1 / etoposide d1-3 on 3/25 - 3/27. Patient started noticing mouth blisters on 3/31. Patient was neutropenic on admission but without fevers. Patient with a new fever on 4/3 and Pharmacy has been consulted to dose levofloxacin for febrile neutropenia. The patient has been started on GCSF. The patient is also noted to have recent courses of cefdinir, clarithromycin, and levofloxacin in the past month. Primaxin per pharmacy added 4/4 by Dr. Jana Hakim.  Antiinfectives 4/3 >> Levaquin >> 4/4 >> Primaxin >> 4/7  Labs/ vitals Tmax: AF now(on APAP) WBCs: 4.8 (ANC 3.6). Stopped Granix. Renal: SCr improved to wnl(baseline ~1), 68CG, 94N  Microbiology 4/3 blood x2: ngtd 4/3 urine: negative  Goal of Therapy:  Appropriate antibiotic dosing for renal function; eradication of infection  Plan:   Cont Levaquin 750mg  q24h. Spoke with patient, he is tolerating PO meds with no problem. Meets criteria for auto-switch to PO.   Dose adjustments not likely to be needed. Pharmacy will sign-off.  MD: specify duration of therapy if possible.  Romeo Rabon, PharmD, pager [phone]. 05/31/2013,9:30 AM.

## 2013-05-31 NOTE — Discharge Summary (Addendum)
Physician Discharge Summary  Patient ID: Ricky Villanueva MRN: [id number] DOB/AGE: Jun 30, 1950 63 y.o.  Admit date: 05/25/2013 Discharge date: 05/31/2013  Primary Care Physician:  [name], MD  Discharge Diagnoses:  Acute renal failure, mucositis and esophagitis, dehydration secondary to poor oral intake, uncontrolled pain, febrile neutropenia likely secondary to HCAP  Present on Admission:  . Acute renal failure  Discharge Medications: New medications include Maalox/Mylanta; Benecalarie liquid; Colace 100 mg bid; Levofloxacin 750 mg daily.  Morphine 30 mg q 12 hours.  Sucralfate 1 gm/10 ml suspension.  Oxycodne 5 mg q 4 hours prn. Hold xalreto until plts are greater than 50.     Medication List    ASK your doctor about these medications       albuterol 108 (90 BASE) MCG/ACT inhaler  Commonly known as:  PROVENTIL HFA;VENTOLIN HFA  Inhale 2 puffs into the lungs every 6 (six) hours as needed for wheezing or shortness of breath.     dexamethasone 4 MG tablet  Commonly known as:  DECADRON  Take 4 mg by mouth 2 (two) times daily with a meal. Take 2 tablets once daily the day after chemo then take 2 tablets twice daily for two days     emollient cream  Commonly known as:  BIAFINE  Apply topically 2 (two) times daily. To left ear.     lisinopril 10 MG tablet  Commonly known as:  PRINIVIL,ZESTRIL  Take 1 tablet (10 mg total) by mouth daily.     LORazepam 0.5 MG tablet  Commonly known as:  ATIVAN  Take 0.5 mg by mouth every 8 (eight) hours.     ondansetron 8 MG tablet  Commonly known as:  ZOFRAN  Take by mouth every 8 (eight) hours as needed for nausea or vomiting. Take 1 tablet by mouth 2 times daily as needed start on 3rd day after chemotherapy     oxyCODONE 5 MG immediate release tablet  Commonly known as:  Oxy IR/ROXICODONE  Take 1 tablet (5 mg total) by mouth every 4 (four) hours as needed for severe pain.     pantoprazole 40 MG tablet  Commonly known as:   PROTONIX  Take 1 tablet (40 mg total) by mouth daily.     PRESCRIPTION MEDICATION  etoposide (VEPESID) 160 mg in sodium chloride 0.9 % 500 mL chemo infusion 100 mg/m2  1.64 m2 (Treatment Plan Actual)  Once 05/20/2013     prochlorperazine 10 MG tablet  Commonly known as:  COMPAZINE  Take 10 mg by mouth every 6 (six) hours as needed for nausea or vomiting.     ranitidine 300 MG capsule  Commonly known as:  ZANTAC  Take 1 capsule (300 mg total) by mouth daily.     tiotropium 18 MCG inhalation capsule  Commonly known as:  SPIRIVA HANDIHALER  Place 1 capsule (18 mcg total) into inhaler and inhale daily.     XARELTO 20 MG Tabs tablet  Generic drug:  Rivaroxaban  Take 20 mg by mouth daily with supper.        Disposition and Follow-up:  1) Labs and follow up with me on 06/03/2013 at 3pm.  2) Follow up with audiology on 06/16/2013 at 8:30 am  Significant Diagnostic Studies:  Dg Chest 2 View  05/27/2013   CLINICAL DATA:  Fever, neutropenia, cough  EXAM: CHEST  2 VIEW  COMPARISON:  CT ANGIO CHEST W/CM &/OR WO/CM dated 04/21/2013; DG CHEST 2 VIEW dated 04/21/2013  FINDINGS: Cardiac silhouette within normal  limits. Decreased conspicuity of the right lower lobe infiltrate. Linear appearing increased density left lung base. Minimal blunting of the left costophrenic angle. No further focal regions of consolidation or focal infiltrates. Osseous structures unremarkable.  IMPRESSION: Improving right lower lobe infiltrate. Continued surveillance evaluation recommended.  Atelectasis versus mild infiltrate left lung base.   Electronically Signed   By: Margaree Mackintosh M.D.   On: 05/27/2013 16:51    Discharge Laboratory Values: CBC    Component Value Date/Time   WBC 4.8 05/31/2013 0345   WBC 3.7* 05/25/2013 0928   RBC 2.75* 05/31/2013 0345   RBC 4.56 05/25/2013 0928   HGB 8.3* 05/31/2013 0345   HGB 13.9 05/25/2013 0928   HCT 24.3* 05/31/2013 0345   HCT 42.5 05/25/2013 0928   PLT 30* 05/31/2013 0345   PLT 90*  05/25/2013 0928   MCV 88.4 05/31/2013 0345   MCV 93.2 05/25/2013 0928   MCH 30.2 05/31/2013 0345   MCH 30.6 05/25/2013 0928   MCHC 34.2 05/31/2013 0345   MCHC 32.8 05/25/2013 0928   RDW 12.2 05/31/2013 0345   RDW 13.2 05/25/2013 0928   LYMPHSABS 0.5* 05/31/2013 0345   LYMPHSABS 0.4* 05/25/2013 0928   MONOABS 0.7 05/31/2013 0345   MONOABS 0.0* 05/25/2013 0928   EOSABS 0.0 05/31/2013 0345   EOSABS 0.1 05/25/2013 0928   BASOSABS 0.0 05/31/2013 0345   BASOSABS 0.0 05/25/2013 0928    CMP     Component Value Date/Time   NA 138 05/31/2013 0345   NA 136 05/25/2013 0929   K 3.7 05/31/2013 0345   K 5.3* 05/25/2013 0929   CL 103 05/31/2013 0345   CO2 26 05/31/2013 0345   CO2 24 05/25/2013 0929   GLUCOSE 79 05/31/2013 0345   GLUCOSE 155* 05/25/2013 0929   BUN 8 05/31/2013 0345   BUN 39.1* 05/25/2013 0929   CREATININE 0.83 05/31/2013 0345   CREATININE 1.5* 05/25/2013 0929   CALCIUM 8.0* 05/31/2013 0345   CALCIUM 9.8 05/25/2013 0929   PROT 5.1* 05/26/2013 0405   PROT 8.1 05/16/2013 1519   ALBUMIN 2.5* 05/26/2013 0405   ALBUMIN 3.2* 05/16/2013 1519   AST 12 05/26/2013 0405   AST 19 05/16/2013 1519   ALT 12 05/26/2013 0405   ALT 17 05/16/2013 1519   ALKPHOS 70 05/26/2013 0405   ALKPHOS 91 05/16/2013 1519   BILITOT 0.5 05/26/2013 0405   BILITOT 0.33 05/16/2013 1519   GFRNONAA >90 05/31/2013 0345   GFRAA >90 05/31/2013 0345   Brief H and P: For complete details please refer to admission H and P, but in brief, since his chemo treatment with cisplatin & etoposide on 05/18/13, he developed severe mucositis, esophagitis, uncontrolled pain, poor appetite and poor oral intake with weakness, dizziness and dehydration. He was admitted on 05/25/2013.  The patient denied any mouth sores, nausea, vomiting or change in bowel habits. He had lost more than 5 pounds of weight in 1 week. He denies any recent fever, chills, night sweats. He denies recent hemoptysis. He was not able to swallow pills due to severe esophagitis. He is rating his pain at 10/10. He had been constipated for  3 days prior to admission.  Physical Exam at Discharge: BP 121/76  Pulse 107  Temp(Src) 97.8 F (36.6 C) (Oral)  Resp 16  Ht 5\' 7"  (1.702 m)  Wt 113 lb 12 oz (51.597 kg)  BMI 17.81 kg/m2  SpO2 91% Gen: Sitting at edge of bed; NAD;  On 2 Liters [place] HEENT: Redness on  ears; No ulcers in mouth, MMM Cardiovascular: RRR;S1S2 present Respiratory: CTAB/L.  No wheezes.  Gastrointestinal: S/NT/ND +BS Extremities: No edema  Hospital Course:  He was started on supportive care and pain medications for his mucositis and esophagitis on 04/01.  He was provided aggressive IVF and anti-hypertensives were held.  His acute renal failure resolved. He also had leukopenia and  thrombocytopenia which was observed initially with neutropenic precautions.  Nutrition was consulted and he was started on supplementation with ensurse/boost for his protein calorie malnutrition. His malnutrition is a direct result of his anorexia related to his extensive small cell carcinoma.   He developed a fever of 101.5 on 04/03 in the afternoon with mild hypotension.  He was started on granix 480 mcg and primaxin plus levaquin.  Blood cultures and urine cultures were negative.  CXR as indicated below.  He was switched to po levaquin 750 mg daily on day of discharge and will complete 7 additional days.   He tolerates po without difficulty.  His WBC is 4.8 on day of discharge.  Last Granix on 04/06 in evening.   We will hold his xarelto until his plts are greater than 50,000.  We will repeat labs on 06/03/2013.   Diet:  Regular Soft diet  Activity:  Ad Lib with assistance  Condition at Discharge:   Stable  Signed: Dr. Concha Norway [phone]  05/31/2013, 2:42 PM

## 2013-06-01 ENCOUNTER — Telehealth: Payer: Self-pay

## 2013-06-01 NOTE — Telephone Encounter (Signed)
S/w daughter about med list, clarified questions.

## 2013-06-02 ENCOUNTER — Encounter: Payer: Self-pay | Admitting: Internal Medicine

## 2013-06-02 ENCOUNTER — Other Ambulatory Visit (HOSPITAL_BASED_OUTPATIENT_CLINIC_OR_DEPARTMENT_OTHER): Payer: BC Managed Care – PPO

## 2013-06-02 ENCOUNTER — Telehealth: Payer: Self-pay | Admitting: Internal Medicine

## 2013-06-02 ENCOUNTER — Telehealth: Payer: Self-pay | Admitting: Medical Oncology

## 2013-06-02 ENCOUNTER — Other Ambulatory Visit: Payer: Self-pay | Admitting: Medical Oncology

## 2013-06-02 ENCOUNTER — Ambulatory Visit (HOSPITAL_BASED_OUTPATIENT_CLINIC_OR_DEPARTMENT_OTHER): Payer: BC Managed Care – PPO | Admitting: Internal Medicine

## 2013-06-02 VITALS — BP 143/91 | HR 95 | Temp 97.9°F | Resp 18 | Ht 67.0 in | Wt 122.2 lb

## 2013-06-02 DIAGNOSIS — C7931 Secondary malignant neoplasm of brain: Secondary | ICD-10-CM

## 2013-06-02 DIAGNOSIS — C3430 Malignant neoplasm of lower lobe, unspecified bronchus or lung: Secondary | ICD-10-CM

## 2013-06-02 DIAGNOSIS — C7949 Secondary malignant neoplasm of other parts of nervous system: Secondary | ICD-10-CM

## 2013-06-02 DIAGNOSIS — E860 Dehydration: Secondary | ICD-10-CM

## 2013-06-02 DIAGNOSIS — D6959 Other secondary thrombocytopenia: Secondary | ICD-10-CM

## 2013-06-02 DIAGNOSIS — C3490 Malignant neoplasm of unspecified part of unspecified bronchus or lung: Secondary | ICD-10-CM

## 2013-06-02 DIAGNOSIS — E43 Unspecified severe protein-calorie malnutrition: Secondary | ICD-10-CM

## 2013-06-02 DIAGNOSIS — I2699 Other pulmonary embolism without acute cor pulmonale: Secondary | ICD-10-CM

## 2013-06-02 DIAGNOSIS — D630 Anemia in neoplastic disease: Secondary | ICD-10-CM

## 2013-06-02 LAB — COMPREHENSIVE METABOLIC PANEL (CC13)
ALK PHOS: 88 U/L (ref 40–150)
ALT: 6 U/L (ref 0–55)
AST: 19 U/L (ref 5–34)
Albumin: 2.3 g/dL — ABNORMAL LOW (ref 3.5–5.0)
Anion Gap: 9 mEq/L (ref 3–11)
BUN: 12.2 mg/dL (ref 7.0–26.0)
CO2: 31 mEq/L — ABNORMAL HIGH (ref 22–29)
Calcium: 8.7 mg/dL (ref 8.4–10.4)
Chloride: 101 mEq/L (ref 98–109)
Creatinine: 0.8 mg/dL (ref 0.7–1.3)
Glucose: 98 mg/dl (ref 70–140)
Potassium: 3.3 mEq/L — ABNORMAL LOW (ref 3.5–5.1)
Sodium: 141 mEq/L (ref 136–145)
Total Bilirubin: 0.25 mg/dL (ref 0.20–1.20)
Total Protein: 5.1 g/dL — ABNORMAL LOW (ref 6.4–8.3)

## 2013-06-02 LAB — CULTURE, BLOOD (ROUTINE X 2)
CULTURE: NO GROWTH
Culture: NO GROWTH

## 2013-06-02 LAB — CBC WITH DIFFERENTIAL/PLATELET
BASO%: 0.2 % (ref 0.0–2.0)
BASOS ABS: 0 10*3/uL (ref 0.0–0.1)
EOS%: 0.1 % (ref 0.0–7.0)
Eosinophils Absolute: 0 10*3/uL (ref 0.0–0.5)
HEMATOCRIT: 29.1 % — AB (ref 38.4–49.9)
HEMOGLOBIN: 9.6 g/dL — AB (ref 13.0–17.1)
LYMPH%: 5.3 % — AB (ref 14.0–49.0)
MCH: 29.9 pg (ref 27.2–33.4)
MCHC: 32.9 g/dL (ref 32.0–36.0)
MCV: 90.8 fL (ref 79.3–98.0)
MONO#: 1.9 10*3/uL — ABNORMAL HIGH (ref 0.1–0.9)
MONO%: 9.8 % (ref 0.0–14.0)
NEUT#: 16.8 10*3/uL — ABNORMAL HIGH (ref 1.5–6.5)
NEUT%: 84.6 % — ABNORMAL HIGH (ref 39.0–75.0)
Platelets: 100 10*3/uL — ABNORMAL LOW (ref 140–400)
RBC: 3.2 10*6/uL — ABNORMAL LOW (ref 4.20–5.82)
RDW: 12.6 % (ref 11.0–14.6)
WBC: 19.9 10*3/uL — ABNORMAL HIGH (ref 4.0–10.3)
lymph#: 1 10*3/uL (ref 0.9–3.3)

## 2013-06-02 LAB — LACTATE DEHYDROGENASE (CC13): LDH: 368 U/L — ABNORMAL HIGH (ref 125–245)

## 2013-06-02 MED ORDER — SODIUM CHLORIDE 0.9 % IV SOLN: 1000.0000 mL | INTRAVENOUS | Status: AC

## 2013-06-02 MED ORDER — SODIUM CHLORIDE 0.9 % IV SOLN: INTRAVENOUS | Status: DC

## 2013-06-02 NOTE — Telephone Encounter (Signed)
s.w. pt wife and advised on April appt....pt already aware

## 2013-06-02 NOTE — Telephone Encounter (Signed)
Ricky Villanueva-daughter called stating her father is not doing well. He was discharged home. He is not eating, his feet are swelling and he is still having lots of abdominal pain. He is taking his morphine and his oxycodone but it does not seem to be helping very well. Pt wants to be readmitted to the hospital. I spoke with Dr. Juliann Mule and if the family can bring him he would like to access him with labs. Ricky Villanueva states she can bring him now. POF sent.

## 2013-06-02 NOTE — Telephone Encounter (Signed)
This POF not finished , MD and lab are in, called michelle left VM  and emailed for tomorrow IV fluid and beyond

## 2013-06-03 ENCOUNTER — Other Ambulatory Visit: Payer: Self-pay | Admitting: Medical Oncology

## 2013-06-03 ENCOUNTER — Telehealth: Payer: Self-pay | Admitting: *Deleted

## 2013-06-03 ENCOUNTER — Encounter: Payer: Self-pay | Admitting: *Deleted

## 2013-06-03 ENCOUNTER — Ambulatory Visit (HOSPITAL_BASED_OUTPATIENT_CLINIC_OR_DEPARTMENT_OTHER): Payer: BC Managed Care – PPO

## 2013-06-03 ENCOUNTER — Ambulatory Visit: Payer: BC Managed Care – PPO

## 2013-06-03 ENCOUNTER — Telehealth: Payer: Self-pay | Admitting: Internal Medicine

## 2013-06-03 VITALS — BP 152/84 | HR 95 | Temp 98.7°F

## 2013-06-03 DIAGNOSIS — E860 Dehydration: Secondary | ICD-10-CM

## 2013-06-03 MED ORDER — SODIUM CHLORIDE 0.9 % IV SOLN
Freq: Once | INTRAVENOUS | Status: AC
  Administered 2013-06-03: 10:00:00 via INTRAVENOUS

## 2013-06-03 NOTE — Telephone Encounter (Signed)
pt came in and needed to adjust appts...done.Ricky KitchenMarland Kitchenpt will get sched in tx

## 2013-06-03 NOTE — Telephone Encounter (Signed)
Talked to pt and he has all appt dor April 2015

## 2013-06-03 NOTE — Telephone Encounter (Signed)
Per staff message and POF I have scheduled appts.  JMW  

## 2013-06-03 NOTE — Patient Instructions (Signed)

## 2013-06-04 MED ORDER — RIVAROXABAN 20 MG PO TABS: 20.0000 mg | ORAL_TABLET | Freq: Every day | ORAL | Status: DC

## 2013-06-04 NOTE — Progress Notes (Signed)
[facility] OFFICE PROGRESS NOTE  [name], [place] [address]  DIAGNOSIS: Small cell carcinoma of lung - Plan: 0.9 %  sodium chloride infusion, CBC with Differential, Basic metabolic panel (Bmet) - [facility], Lactate dehydrogenase (LDH) - [facility], Magnesium - [facility], CBC with Differential, Comprehensive metabolic panel (Cmet) - [facility], Lactate dehydrogenase (LDH) - [facility], DISCONTINUED: 0.9 %  sodium chloride infusion, DISCONTINUED: 0.9 %  sodium chloride infusion  Protein-calorie malnutrition, severe - Plan: 0.9 %  sodium chloride infusion, DISCONTINUED: 0.9 %  sodium chloride infusion, DISCONTINUED: 0.9 %  sodium chloride infusion  Acute pulmonary embolism  Chief Complaint  Patient presents with  . Follow-up    CURRENT TREATMENT: WB XRT started on 03/10 - 03/25 per Dr. Pablo Ledger.  Started cisplatin plus etoposide on 03/25 as detailed below.   INTERVAL HISTORY: Ricky Villanueva 63 y.o. 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  He was last seen by me on 05/02/2013.   His past medical history also includes HTN/Hyperlipidemia and longstanding smoking history but quit 12/25/2011 presented to ER on 04/21/13 with worsening dyspnea, cough with streaks of blood over the past one week. In the ED, his oxygen saturation was 91% on 2 L nasal canula. CXR revealed a progressive increased density in the right lower lobe consistent with worsening atelectasis or pneumonia and a soft tissue fullness in the hilar region suspicious for lymphadenopathy or mass.and follow-up CT of chest was recommended. It revealed a nonocclusive left upper lobe, right lower lobe pulmonary emboli with right heart strain. Bulky mediastinal and right hilar lymphadenopathy, encasing the of pulmonary arteries, pulmonary veins and bronchi, completely effacing right lower lobe segmental bronchi. Extensive consolidation in  the right lung suggested postobstructive pneumonia, with interstitial prominence which may reflect superimposed lymphangitic spread of infection or neoplasm. He was admitted and started on antibiotics and heparin. Pulmonary was consulted and he had a video bronchoscopy on 03/03 with consistent with small cell ca of lung. 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 He had WBXRT as noted above.   He then received his first cycle of chemotherapy on 03/25 thru 03/27 and was admitted 04/01 and discharged on 05/31/13 due to dehydration, acute renal failure, mucositis and subsequently had febrile neutropenia.  He was given intravenous hydration and started on Granix and provided broad spectrum antibiotics.  His Xarelto was held briefly due to profound thrombocytopenia.  On day of discharge, he was tolerating orals without difficulty.  He was afebrile. Today, patient is accompanied by his wife [name], daughter and grandson.  He was scheduled for follow up tomorrow but due to lower extremity swelling and continued fatigue, he requested to be seen today.  He also reported persistent mucositis.  He is scheduled to see audiology on 04/23.   MEDICAL HISTORY: Past Medical History  Diagnosis Date  . Hyperlipidemia   . Hypertension   . GERD (gastroesophageal reflux disease)   . Respiratory failure with hypoxia 04/21/2013    secondary to pneumonia/notes 04/21/2013  . Pulmonary embolism     "got one in there now" (04/21/2013)  . COPD (chronic obstructive pulmonary disease)   . Pneumonia 2/?01/2014    "wouldn't get better; hospitalized 04/21/2013)  . Arthritis     "minor in my right  hand" (04/21/2013)    INTERIM HISTORY: has GERD (gastroesophageal reflux disease); HCAP (healthcare-associated pneumonia); Respiratory failure with hypoxia; HTN (hypertension); Acute pulmonary embolism; Lung mass;  Emphysema lung; Small cell carcinoma of lung; Acute renal failure; and Protein-calorie malnutrition, severe on his problem list.    ALLERGIES:  is allergic to augmentin.  MEDICATIONS: has a current medication list which includes the following prescription(s): albuterol, alum & mag hydroxide-simeth, dss, emollient, morphine, benecalorie, ondansetron, oxycodone, pantoprazole, prochlorperazine, ranitidine, sucralfate, tiotropium, dexamethasone, and lisinopril.  SURGICAL HISTORY:  Past Surgical History  Procedure Laterality Date  . Inguinal hernia repair Right ~ 2005  . Finger surgery Left ~ 1989    "crushed end of my middle finger off"   . Video bronchoscopy Bilateral 04/26/2013    Procedure: VIDEO BRONCHOSCOPY WITH FLUORO;  Surgeon: Wilhelmina Mcardle, MD;  Location: [facility] ENDOSCOPY;  Service: Cardiopulmonary;  Laterality: Bilateral;    REVIEW OF SYSTEMS:   Constitutional: Denies fevers, chills or abnormal weight loss Eyes: Denies blurriness of vision Ears, nose, mouth, throat, and face: Denies mucositis or sore throat Respiratory: He reports cough, dyspnea  But denies wheezes Cardiovascular: Denies palpitation, chest discomfort or lower extremity swelling Gastrointestinal:  Denies nausea, heartburn or change in bowel habits Skin: Denies abnormal skin rashes Lymphatics: Denies new lymphadenopathy or easy bruising Neurological:Denies numbness, tingling or new weaknesses Behavioral/Psych: Mood is stable, no new changes  All other systems were reviewed with the patient and are negative.  PHYSICAL EXAMINATION: ECOG PERFORMANCE STATUS: 1 - Symptomatic but completely ambulatory  Blood pressure 143/91, pulse 95, temperature 97.9 F (36.6 C), temperature source Oral, resp. rate 18, height 5\' 7"  (1.702 m), weight 122 lb 3.2 oz (55.43 kg), SpO2 92.00%.  GENERAL:alert, no distress and comfortable, thin chronically ill appearing gentleman on 2 Liters of oxygen. HOH. SKIN: skin color, texture, turgor  are normal, no rashes or significant lesions EYES: normal, Conjunctiva are pink and non-injected, sclera clear OROPHARYNX:no exudate, no erythema and lips, buccal mucosa, and tongue normal  NECK: supple, thyroid normal size, non-tender, without nodularity LYMPH:  no palpable lymphadenopathy in the cervical, axillary or supraclavicular LUNGS: clear to auscultation with normal breathing effort, no wheezes or rhonchi on the left. R lung with decreased breath sound at the bases.  HEART: regular rate & rhythm and no murmurs and no lower extremity edema ABDOMEN:abdomen soft, non-tender and normal bowel sounds Musculoskeletal:no cyanosis of digits and no clubbing  NEURO: alert & oriented x 3 with fluent speech, no focal motor/sensory deficits  Labs:  Lab Results  Component Value Date   WBC 19.9* 06/02/2013   HGB 9.6* 06/02/2013   HCT 29.1* 06/02/2013   MCV 90.8 06/02/2013   PLT 100* 06/02/2013   NEUTROABS 16.8* 06/02/2013      Chemistry      Component Value Date/Time   NA 141 06/02/2013 1520   NA 138 05/31/2013 0345   K 3.3* 06/02/2013 1520   K 3.7 05/31/2013 0345   CL 103 05/31/2013 0345   CO2 31* 06/02/2013 1520   CO2 26 05/31/2013 0345   BUN 12.2 06/02/2013 1520   BUN 8 05/31/2013 0345   CREATININE 0.8 06/02/2013 1520   CREATININE 0.83 05/31/2013 0345      Component Value Date/Time   CALCIUM 8.7 06/02/2013 1520   CALCIUM 8.0* 05/31/2013 0345   ALKPHOS 88 06/02/2013 1520   ALKPHOS 70 05/26/2013 0405   AST 19 06/02/2013 1520   AST 12 05/26/2013 0405   ALT 6 06/02/2013 1520   ALT  12 05/26/2013 0405   BILITOT 0.25 06/02/2013 1520   BILITOT 0.5 05/26/2013 0405       Basic Metabolic Panel:  Recent Labs Lab 05/31/13 0345 06/02/13 1520  NA 138 141  K 3.7 3.3*  CL 103  --   CO2 26 31*  GLUCOSE 79 98  BUN 8 12.2  CREATININE 0.83 0.8  CALCIUM 8.0* 8.7  MG 1.5  --    GFR Estimated Creatinine Clearance: 76 ml/min (by C-G formula based on Cr of 0.8). Liver Function Tests:  CBC:  Recent Labs Lab 05/29/13 0357  05/30/13 0505 05/31/13 0345 06/02/13 1517  WBC 0.2* 0.9* 4.8 19.9*  NEUTROABS 0.0* 0.5* 3.6 16.8*  HGB 8.7* 8.4* 8.3* 9.6*  HCT 25.3* 24.8* 24.3* 29.1*  MCV 89.1 89.9 88.4 90.8  PLT 35* 24* 30* 100*    Studies:  No results found.   RADIOGRAPHIC STUDIES: None  PATHOLOGY: Diagnosis 04/26/2013 Bronchus, biopsy, RLL - POORLY DIFFERENTIATED SMALL CELL NEUROENDOCRINE CARCINOMA. Microscopic Comment The biopsies are extensively involved by an atypical infiltrate with histologic features of small cell carcinoma. Immunohistochemistry is performed and the tumor is positive for CD56, Chromogranin, Synaptophysin, Thyroid Transcription Factor - 1, Cytokeratin 7 and Cytokeratin AE1 / AE3. Tumor is negative for Cytokeratin 56 and mostly negative with p63. The histologic and immunophenotypic characteristics are consistent with poorly differentiated small cell neuroendocrine carcinoma. Case discussed with Dr. Alva Garnet on 04/27/13. (JDP:caf 04/28/13) Claudette Laws MD Pathologist, Electronic Signature (Case signed 04/28/2013) Specimen Gross and Clinical Information Specimen(s) Obtained: Bronchus, biopsy, RLL  ASSESSMENT: Ricky Villanueva 63 y.o. male with a history of Small cell carcinoma of lung - Plan: 0.9 %  sodium chloride infusion, CBC with Differential, Basic metabolic panel (Bmet) - [facility], Lactate dehydrogenase (LDH) - [facility], Magnesium - [facility], CBC with Differential, Comprehensive metabolic panel (Cmet) - [facility], Lactate dehydrogenase (LDH) - [facility], DISCONTINUED: 0.9 %  sodium chloride infusion, DISCONTINUED: 0.9 %  sodium chloride infusion  Protein-calorie malnutrition, severe - Plan: 0.9 %  sodium chloride infusion, DISCONTINUED: 0.9 %  sodium chloride infusion, DISCONTINUED: 0.9 %  sodium chloride infusion  Acute pulmonary embolism  63 yo male with newly diagnosed Small cell carcinoma of the R lung complicated by obstructive pneumonia, PE. ECOG 1. + weight loss.   PLAN:  1. 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 Pablo Ledger completed whole brain XRT on 03/25.  We started chemotherapy on 03/25 with labs and a follow-up visit in 2 weeks on 04/08.   His chemotherapy for extensive stage small cell lung cancer consisted of the following:    Cisplatin 80 mg/m2 on day 1  Etoposide 100 mg/m2 days 1,2,3   For a maximum of 4-6 cycles every 21 days  We provided a detailed discussion regarding the indications, benefits and risks of these chemotherapies.  The benefits include palliation of his current symptoms.  He is aware that his disease is not curable given evidence of metastases in the brain.  The risks associated with these chemotherapies include but is not limited to myelosuppression that may result in life threatening infections, nephrotoxicity, ototoxicity, peripheral neuropathy, nausea/vomiting, diarrhea or constipation, fatigue, decreased appetite.  He voiced understanding these side-effects, indications and benefits and he wished to proceed with chemotherapy.  He is aware while of chemotherapy to report any temperatures greater than 100.5 to the oncologist-on-call and/or report to the nearest emergency room.  Prescriptions for his anti-emetics were provided.  He has seen chemotherapy teaching class.  He awaits for audiology appointment.   --Given his recent hospitalization as detailed above and now complains of hearing fullness and intermittent tinnitus, we will discontinue cisplatin and substitute with carboplatin AUC 5.  In addition, we will decrease etoposide dose by 25% due to profound neutropenia. We will also continue neulasta of Day#4 of his chemotherapy each cycle due to febrile neutropenia. Plan start day for his cycle #2, day #1 is on 06/15/2013.   2. Pain associated with #1, improved. --Patient reported insomnia secondary to pain with deep breathing.  We provided him oxycodone 5 mg by mouth every 4 hours as need for severe pain. He reports  improvement and now uses these as needed.   3. PE (04/21/2013). --CTA of chest noted above consistent with Nonocclusive left upper lobe, right lower lobe pulmonary emboli. Right heart strain.  Continue  xarelto 20 mg daily.   4. Anemia/Thrombocytopenia secondary to #1. --Plts have improved so we instructed him to result his Evalina Field.  5. Hypertension. --He will continue lisinopril 10 mg daily as tolerated.  6. Mucositis secondary #1.  --Carafate as tolerated.    7. Emphysema --Continue spiriva hanihaler 18 mcg inhalation daily. Oxygen 2 liters by [place]  8. Anorexia/dehydration --We will provide fluid hydration on Friday (04/10) and on 04/14 and 04/17.  He declined marinol today.  We will consider megace.  Continue to supplement with his diet with ensure and boost.   9. Follow-up.  --Patient will follow up on 04/22 for consideration of chemotherapy with carbo plus etoposide cycle #2, day #1.   All questions were answered. The patient knows to call the clinic with any problems, questions or concerns. We can certainly see the patient much sooner if necessary.  I spent 25 minutes counseling the patient face to face. The total time spent in the appointment was 40 minutes.    Concha Norway, MD 06/04/2013 8:03 AM

## 2013-06-05 ENCOUNTER — Other Ambulatory Visit: Payer: Self-pay | Admitting: Internal Medicine

## 2013-06-05 MED ORDER — POTASSIUM CHLORIDE ER 10 MEQ PO TBCR: 10.0000 meq | EXTENDED_RELEASE_TABLET | Freq: Every day | ORAL | Status: DC

## 2013-06-07 ENCOUNTER — Other Ambulatory Visit: Payer: Self-pay | Admitting: Medical Oncology

## 2013-06-07 ENCOUNTER — Ambulatory Visit (HOSPITAL_BASED_OUTPATIENT_CLINIC_OR_DEPARTMENT_OTHER): Payer: BC Managed Care – PPO

## 2013-06-07 ENCOUNTER — Other Ambulatory Visit: Payer: Self-pay | Admitting: Internal Medicine

## 2013-06-07 VITALS — BP 140/80 | HR 75 | Temp 98.0°F | Resp 18

## 2013-06-07 DIAGNOSIS — C3490 Malignant neoplasm of unspecified part of unspecified bronchus or lung: Secondary | ICD-10-CM

## 2013-06-07 DIAGNOSIS — E860 Dehydration: Secondary | ICD-10-CM

## 2013-06-07 MED ORDER — SODIUM CHLORIDE 0.9 % IV SOLN: INTRAVENOUS | Status: DC

## 2013-06-07 MED ORDER — SODIUM CHLORIDE 0.9 % IV SOLN
Freq: Once | INTRAVENOUS | Status: AC
  Administered 2013-06-07: 16:00:00 via INTRAVENOUS

## 2013-06-07 NOTE — Patient Instructions (Signed)
Dehydration, Adult Dehydration is when you lose more fluids from the body than you take in. Vital organs like the kidneys, brain, and heart cannot function without a proper amount of fluids and salt. Any loss of fluids from the body can cause dehydration.  CAUSES   Vomiting.  Diarrhea.  Excessive sweating.  Excessive urine output.  Fever. SYMPTOMS  Mild dehydration  Thirst.  Dry lips.  Slightly dry mouth. Moderate dehydration  Very dry mouth.  Sunken eyes.  Skin does not bounce back quickly when lightly pinched and released.  Dark urine and decreased urine production.  Decreased tear production.  Headache. Severe dehydration  Very dry mouth.  Extreme thirst.  Rapid, weak pulse (more than 100 beats per minute at rest).  Cold hands and feet.  Not able to sweat in spite of heat and temperature.  Rapid breathing.  Blue lips.  Confusion and lethargy.  Difficulty being awakened.  Minimal urine production.  No tears. DIAGNOSIS  Your caregiver will diagnose dehydration based on your symptoms and your exam. Blood and urine tests will help confirm the diagnosis. The diagnostic evaluation should also identify the cause of dehydration. TREATMENT  Treatment of mild or moderate dehydration can often be done at home by increasing the amount of fluids that you drink. It is best to drink small amounts of fluid more often. Drinking too much at one time can make vomiting worse. Refer to the home care instructions below. Severe dehydration needs to be treated at the hospital where you will probably be given intravenous (IV) fluids that contain water and electrolytes. HOME CARE INSTRUCTIONS   Ask your caregiver about specific rehydration instructions.  Drink enough fluids to keep your urine clear or pale yellow.  Drink small amounts frequently if you have nausea and vomiting.  Eat as you normally do.  Avoid:  Foods or drinks high in sugar.  Carbonated  drinks.  Juice.  Extremely hot or cold fluids.  Drinks with caffeine.  Fatty, greasy foods.  Alcohol.  Tobacco.  Overeating.  Gelatin desserts.  Wash your hands well to avoid spreading bacteria and viruses.  Only take over-the-counter or prescription medicines for pain, discomfort, or fever as directed by your caregiver.  Ask your caregiver if you should continue all prescribed and over-the-counter medicines.  Keep all follow-up appointments with your caregiver. SEEK MEDICAL CARE IF:  You have abdominal pain and it increases or stays in one area (localizes).  You have a rash, stiff neck, or severe headache.  You are irritable, sleepy, or difficult to awaken.  You are weak, dizzy, or extremely thirsty. SEEK IMMEDIATE MEDICAL CARE IF:   You are unable to keep fluids down or you get worse despite treatment.  You have frequent episodes of vomiting or diarrhea.  You have blood or green matter (bile) in your vomit.  You have blood in your stool or your stool looks black and tarry.  You have not urinated in 6 to 8 hours, or you have only urinated a small amount of very dark urine.  You have a fever.  You faint. MAKE SURE YOU:   Understand these instructions.  Will watch your condition.  Will get help right away if you are not doing well or get worse. Document Released: 02/10/2005 Document Revised: 05/05/2011 Document Reviewed: 09/30/2010 [employer] Patient Information 2014 [employer].  

## 2013-06-08 ENCOUNTER — Telehealth: Payer: Self-pay | Admitting: Internal Medicine

## 2013-06-08 ENCOUNTER — Encounter: Payer: BC Managed Care – PPO | Admitting: Nutrition

## 2013-06-08 ENCOUNTER — Ambulatory Visit: Payer: BC Managed Care – PPO

## 2013-06-08 ENCOUNTER — Other Ambulatory Visit: Payer: Self-pay | Admitting: Medical Oncology

## 2013-06-08 DIAGNOSIS — E43 Unspecified severe protein-calorie malnutrition: Secondary | ICD-10-CM

## 2013-06-08 DIAGNOSIS — R918 Other nonspecific abnormal finding of lung field: Secondary | ICD-10-CM

## 2013-06-08 DIAGNOSIS — N179 Acute kidney failure, unspecified: Secondary | ICD-10-CM

## 2013-06-08 DIAGNOSIS — I10 Essential (primary) hypertension: Secondary | ICD-10-CM

## 2013-06-08 DIAGNOSIS — J189 Pneumonia, unspecified organism: Secondary | ICD-10-CM

## 2013-06-08 DIAGNOSIS — I2699 Other pulmonary embolism without acute cor pulmonale: Secondary | ICD-10-CM

## 2013-06-08 DIAGNOSIS — K219 Gastro-esophageal reflux disease without esophagitis: Secondary | ICD-10-CM

## 2013-06-08 MED ORDER — SUCRALFATE 1 GM/10ML PO SUSP: 1.0000 g | Freq: Three times a day (TID) | ORAL | Status: DC

## 2013-06-08 NOTE — Telephone Encounter (Signed)
pt need tx time adjusted done...pt aware of new time.

## 2013-06-08 NOTE — Telephone Encounter (Signed)
Message to chemo scheduler to see if IVF's can be added 4/16 after 1:30pm lab. request per 4/14 pof.

## 2013-06-09 ENCOUNTER — Ambulatory Visit (HOSPITAL_BASED_OUTPATIENT_CLINIC_OR_DEPARTMENT_OTHER): Payer: BC Managed Care – PPO

## 2013-06-09 ENCOUNTER — Ambulatory Visit: Payer: BC Managed Care – PPO

## 2013-06-09 ENCOUNTER — Other Ambulatory Visit: Payer: BC Managed Care – PPO

## 2013-06-09 ENCOUNTER — Telehealth: Payer: Self-pay | Admitting: Internal Medicine

## 2013-06-09 VITALS — BP 124/72 | HR 92 | Temp 97.3°F | Resp 17

## 2013-06-09 DIAGNOSIS — C3490 Malignant neoplasm of unspecified part of unspecified bronchus or lung: Secondary | ICD-10-CM

## 2013-06-09 DIAGNOSIS — E860 Dehydration: Secondary | ICD-10-CM

## 2013-06-09 DIAGNOSIS — C3430 Malignant neoplasm of lower lobe, unspecified bronchus or lung: Secondary | ICD-10-CM

## 2013-06-09 LAB — CBC WITH DIFFERENTIAL/PLATELET
BASO%: 0.4 % (ref 0.0–2.0)
Basophils Absolute: 0.1 10*3/uL (ref 0.0–0.1)
EOS%: 0 % (ref 0.0–7.0)
Eosinophils Absolute: 0 10*3/uL (ref 0.0–0.5)
HEMATOCRIT: 29.4 % — AB (ref 38.4–49.9)
HGB: 9.6 g/dL — ABNORMAL LOW (ref 13.0–17.1)
LYMPH%: 3.9 % — ABNORMAL LOW (ref 14.0–49.0)
MCH: 29.5 pg (ref 27.2–33.4)
MCHC: 32.7 g/dL (ref 32.0–36.0)
MCV: 90.5 fL (ref 79.3–98.0)
MONO#: 1.8 10*3/uL — ABNORMAL HIGH (ref 0.1–0.9)
MONO%: 14 % (ref 0.0–14.0)
NEUT#: 10.2 10*3/uL — ABNORMAL HIGH (ref 1.5–6.5)
NEUT%: 81.7 % — AB (ref 39.0–75.0)
NRBC: 0 % (ref 0–0)
Platelets: 801 10*3/uL — ABNORMAL HIGH (ref 140–400)
RBC: 3.25 10*6/uL — ABNORMAL LOW (ref 4.20–5.82)
RDW: 13.2 % (ref 11.0–14.6)
WBC: 12.5 10*3/uL — ABNORMAL HIGH (ref 4.0–10.3)
lymph#: 0.5 10*3/uL — ABNORMAL LOW (ref 0.9–3.3)

## 2013-06-09 LAB — LACTATE DEHYDROGENASE (CC13): LDH: 217 U/L (ref 125–245)

## 2013-06-09 LAB — BASIC METABOLIC PANEL (CC13)
ANION GAP: 9 meq/L (ref 3–11)
BUN: 5.1 mg/dL — ABNORMAL LOW (ref 7.0–26.0)
CO2: 29 mEq/L (ref 22–29)
CREATININE: 0.8 mg/dL (ref 0.7–1.3)
Calcium: 9.1 mg/dL (ref 8.4–10.4)
Chloride: 100 mEq/L (ref 98–109)
GLUCOSE: 105 mg/dL (ref 70–140)
Potassium: 4.3 mEq/L (ref 3.5–5.1)
Sodium: 139 mEq/L (ref 136–145)

## 2013-06-09 LAB — MAGNESIUM (CC13): MAGNESIUM: 1.8 mg/dL (ref 1.5–2.5)

## 2013-06-09 MED ORDER — SODIUM CHLORIDE 0.9 % IV SOLN
Freq: Once | INTRAVENOUS | Status: AC
  Administered 2013-06-09: 14:00:00 via INTRAVENOUS

## 2013-06-09 NOTE — Telephone Encounter (Signed)
s.w. pt wife and advised on todays appt....ok and aware

## 2013-06-09 NOTE — Patient Instructions (Signed)
Dehydration, Adult Dehydration is when you lose more fluids from the body than you take in. Vital organs like the kidneys, brain, and heart cannot function without a proper amount of fluids and salt. Any loss of fluids from the body can cause dehydration.  CAUSES   Vomiting.  Diarrhea.  Excessive sweating.  Excessive urine output.  Fever. SYMPTOMS  Mild dehydration  Thirst.  Dry lips.  Slightly dry mouth. Moderate dehydration  Very dry mouth.  Sunken eyes.  Skin does not bounce back quickly when lightly pinched and released.  Dark urine and decreased urine production.  Decreased tear production.  Headache. Severe dehydration  Very dry mouth.  Extreme thirst.  Rapid, weak pulse (more than 100 beats per minute at rest).  Cold hands and feet.  Not able to sweat in spite of heat and temperature.  Rapid breathing.  Blue lips.  Confusion and lethargy.  Difficulty being awakened.  Minimal urine production.  No tears. DIAGNOSIS  Your caregiver will diagnose dehydration based on your symptoms and your exam. Blood and urine tests will help confirm the diagnosis. The diagnostic evaluation should also identify the cause of dehydration. TREATMENT  Treatment of mild or moderate dehydration can often be done at home by increasing the amount of fluids that you drink. It is best to drink small amounts of fluid more often. Drinking too much at one time can make vomiting worse. Refer to the home care instructions below. Severe dehydration needs to be treated at the hospital where you will probably be given intravenous (IV) fluids that contain water and electrolytes. HOME CARE INSTRUCTIONS   Ask your caregiver about specific rehydration instructions.  Drink enough fluids to keep your urine clear or pale yellow.  Drink small amounts frequently if you have nausea and vomiting.  Eat as you normally do.  Avoid:  Foods or drinks high in sugar.  Carbonated  drinks.  Juice.  Extremely hot or cold fluids.  Drinks with caffeine.  Fatty, greasy foods.  Alcohol.  Tobacco.  Overeating.  Gelatin desserts.  Wash your hands well to avoid spreading bacteria and viruses.  Only take over-the-counter or prescription medicines for pain, discomfort, or fever as directed by your caregiver.  Ask your caregiver if you should continue all prescribed and over-the-counter medicines.  Keep all follow-up appointments with your caregiver. SEEK MEDICAL CARE IF:  You have abdominal pain and it increases or stays in one area (localizes).  You have a rash, stiff neck, or severe headache.  You are irritable, sleepy, or difficult to awaken.  You are weak, dizzy, or extremely thirsty. SEEK IMMEDIATE MEDICAL CARE IF:   You are unable to keep fluids down or you get worse despite treatment.  You have frequent episodes of vomiting or diarrhea.  You have blood or green matter (bile) in your vomit.  You have blood in your stool or your stool looks black and tarry.  You have not urinated in 6 to 8 hours, or you have only urinated a small amount of very dark urine.  You have a fever.  You faint. MAKE SURE YOU:   Understand these instructions.  Will watch your condition.  Will get help right away if you are not doing well or get worse. Document Released: 02/10/2005 Document Revised: 05/05/2011 Document Reviewed: 09/30/2010 [employer] Patient Information 2014 [employer].  

## 2013-06-09 NOTE — Progress Notes (Signed)
IVF completed without difficulty

## 2013-06-10 ENCOUNTER — Ambulatory Visit: Payer: BC Managed Care – PPO

## 2013-06-11 ENCOUNTER — Ambulatory Visit: Payer: BC Managed Care – PPO

## 2013-06-13 ENCOUNTER — Ambulatory Visit (HOSPITAL_BASED_OUTPATIENT_CLINIC_OR_DEPARTMENT_OTHER): Payer: BC Managed Care – PPO | Admitting: Internal Medicine

## 2013-06-13 ENCOUNTER — Telehealth: Payer: Self-pay | Admitting: Internal Medicine

## 2013-06-13 ENCOUNTER — Other Ambulatory Visit (HOSPITAL_BASED_OUTPATIENT_CLINIC_OR_DEPARTMENT_OTHER): Payer: BC Managed Care – PPO

## 2013-06-13 ENCOUNTER — Telehealth: Payer: Self-pay

## 2013-06-13 VITALS — BP 118/82 | HR 101 | Temp 98.1°F | Resp 18 | Ht 67.0 in | Wt 107.2 lb

## 2013-06-13 DIAGNOSIS — J438 Other emphysema: Secondary | ICD-10-CM

## 2013-06-13 DIAGNOSIS — K2970 Gastritis, unspecified, without bleeding: Secondary | ICD-10-CM

## 2013-06-13 DIAGNOSIS — D630 Anemia in neoplastic disease: Secondary | ICD-10-CM

## 2013-06-13 DIAGNOSIS — E875 Hyperkalemia: Secondary | ICD-10-CM

## 2013-06-13 DIAGNOSIS — K209 Esophagitis, unspecified without bleeding: Secondary | ICD-10-CM

## 2013-06-13 DIAGNOSIS — C7A1 Malignant poorly differentiated neuroendocrine tumors: Secondary | ICD-10-CM

## 2013-06-13 DIAGNOSIS — C3490 Malignant neoplasm of unspecified part of unspecified bronchus or lung: Secondary | ICD-10-CM

## 2013-06-13 DIAGNOSIS — C7B8 Other secondary neuroendocrine tumors: Secondary | ICD-10-CM

## 2013-06-13 DIAGNOSIS — K2990 Gastroduodenitis, unspecified, without bleeding: Secondary | ICD-10-CM

## 2013-06-13 DIAGNOSIS — R630 Anorexia: Secondary | ICD-10-CM

## 2013-06-13 DIAGNOSIS — K1230 Oral mucositis (ulcerative), unspecified: Secondary | ICD-10-CM

## 2013-06-13 DIAGNOSIS — I2699 Other pulmonary embolism without acute cor pulmonale: Secondary | ICD-10-CM

## 2013-06-13 DIAGNOSIS — G893 Neoplasm related pain (acute) (chronic): Secondary | ICD-10-CM

## 2013-06-13 DIAGNOSIS — K121 Other forms of stomatitis: Secondary | ICD-10-CM

## 2013-06-13 LAB — CBC WITH DIFFERENTIAL/PLATELET
BASO%: 1.1 % (ref 0.0–2.0)
Basophils Absolute: 0.1 10*3/uL (ref 0.0–0.1)
EOS%: 0 % (ref 0.0–7.0)
Eosinophils Absolute: 0 10*3/uL (ref 0.0–0.5)
HEMATOCRIT: 31.7 % — AB (ref 38.4–49.9)
HGB: 10.3 g/dL — ABNORMAL LOW (ref 13.0–17.1)
LYMPH#: 0.4 10*3/uL — AB (ref 0.9–3.3)
LYMPH%: 3.1 % — AB (ref 14.0–49.0)
MCH: 29.8 pg (ref 27.2–33.4)
MCHC: 32.6 g/dL (ref 32.0–36.0)
MCV: 91.5 fL (ref 79.3–98.0)
MONO#: 1.3 10*3/uL — ABNORMAL HIGH (ref 0.1–0.9)
MONO%: 10.7 % (ref 0.0–14.0)
NEUT#: 10.3 10*3/uL — ABNORMAL HIGH (ref 1.5–6.5)
NEUT%: 85.1 % — ABNORMAL HIGH (ref 39.0–75.0)
RBC: 3.46 10*6/uL — AB (ref 4.20–5.82)
RDW: 13.5 % (ref 11.0–14.6)
WBC: 12.1 10*3/uL — ABNORMAL HIGH (ref 4.0–10.3)

## 2013-06-13 LAB — COMPREHENSIVE METABOLIC PANEL (CC13)
ALBUMIN: 3.1 g/dL — AB (ref 3.5–5.0)
ALT: 14 U/L (ref 0–55)
ANION GAP: 9 meq/L (ref 3–11)
AST: 30 U/L (ref 5–34)
Alkaline Phosphatase: 84 U/L (ref 40–150)
BUN: 9.5 mg/dL (ref 7.0–26.0)
CALCIUM: 10 mg/dL (ref 8.4–10.4)
CO2: 28 mEq/L (ref 22–29)
Chloride: 100 mEq/L (ref 98–109)
Creatinine: 1.1 mg/dL (ref 0.7–1.3)
GLUCOSE: 116 mg/dL (ref 70–140)
Sodium: 136 mEq/L (ref 136–145)
Total Bilirubin: 0.4 mg/dL (ref 0.20–1.20)
Total Protein: 7.1 g/dL (ref 6.4–8.3)

## 2013-06-13 LAB — LACTATE DEHYDROGENASE (CC13): LDH: 208 U/L (ref 125–245)

## 2013-06-13 MED ORDER — DRONABINOL 2.5 MG PO CAPS: 2.5000 mg | ORAL_CAPSULE | Freq: Two times a day (BID) | ORAL | Status: DC

## 2013-06-13 NOTE — Progress Notes (Signed)
Ricky Villanueva OFFICE PROGRESS NOTE  Ricky Villanueva, Ricky Villanueva [address]  DIAGNOSIS: Anorexia - Plan: CBC with Differential, Basic metabolic panel (Bmet) - [facility], CT Abdomen Pelvis Wo Contrast, CT Chest W Contrast, Ambulatory referral to Gastroenterology  Small cell carcinoma of lung - Plan: CBC with Differential, Basic metabolic panel (Bmet) - [facility], CT Abdomen Pelvis Wo Contrast, CT Chest W Contrast, Ambulatory referral to Gastroenterology  Hyperkalemia - Plan: Basic metabolic panel (Bmet) - [facility]  Chief Complaint  Patient presents with  . Follow-up    CURRENT TREATMENT: WB XRT started on 03/10 - 03/25 per Dr. Pablo Ledger.  Started cisplatin plus etoposide on 03/25 as detailed below.   INTERVAL HISTORY: Ricky Villanueva 63 y.o. 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  He was last seen by me on 06/02/2013.   His past medical history also includes HTN/Hyperlipidemia and longstanding smoking history but quit 12/25/2011 presented to ER on 04/21/13 with worsening dyspnea, cough with streaks of blood over the past one week. In the ED, his oxygen saturation was 91% on 2 L nasal canula. CXR revealed a progressive increased density in the right lower lobe consistent with worsening atelectasis or pneumonia and a soft tissue fullness in the hilar region suspicious for lymphadenopathy or mass.and follow-up CT of chest was recommended. It revealed a nonocclusive left upper lobe, right lower lobe pulmonary emboli with right heart strain. Bulky mediastinal and right hilar lymphadenopathy, encasing the of pulmonary arteries, pulmonary veins and bronchi, completely effacing right lower lobe segmental bronchi. Extensive consolidation in the right lung suggested postobstructive pneumonia, with interstitial prominence which may reflect superimposed lymphangitic spread of infection or neoplasm. He was  admitted and started on antibiotics and heparin. Pulmonary was consulted and he had a video bronchoscopy on 03/03 with consistent with small cell ca of lung. 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 He had WBXRT as noted above.   He then received his first cycle of chemotherapy on 03/25 thru 03/27 and was admitted 04/01 and discharged on 05/31/13 due to dehydration, acute renal failure, mucositis and subsequently had febrile neutropenia.  We restarted the Xarelto which held  due to profound thrombocytopenia.  Today, patient is accompanied by his wife Ricky Villanueva, daughter and grandson.   He is scheduled to see audiology on 04/23. He eating sparingly and feels the esophagus burns following his meals.  He drinks ensure and boost as much as he can tolerate. He is on protonix 40 mg daily.  Zantac doing day.  No fevers or chills or he tolerates oxygen 2 liters by nasal cannula.    MEDICAL HISTORY: Past Medical History  Diagnosis Date  . Hyperlipidemia   . Hypertension   . GERD (gastroesophageal reflux disease)   . Respiratory failure with hypoxia 04/21/2013    secondary to pneumonia/notes 04/21/2013  . Pulmonary embolism     "got one in there now" (04/21/2013)  . COPD (chronic obstructive pulmonary disease)   . Pneumonia 2/?01/2014    "wouldn't get better; hospitalized 04/21/2013)  . Arthritis     "minor in my right hand" (04/21/2013)    INTERIM HISTORY: has GERD (gastroesophageal reflux disease); HCAP (healthcare-associated pneumonia); Respiratory failure with hypoxia; HTN (hypertension); Acute pulmonary embolism; Lung mass; Emphysema lung; Small  cell carcinoma of lung; Acute renal failure; Protein-calorie malnutrition, severe; and Anorexia on his problem list.    ALLERGIES:  is allergic to augmentin.  MEDICATIONS: has a current medication list which includes the following  prescription(s): albuterol, alum & mag hydroxide-simeth, dexamethasone, dss, emollient, morphine, benecalorie, ondansetron, oxycodone, pantoprazole, prochlorperazine, ranitidine, rivaroxaban, sucralfate, tiotropium, and dronabinol.  SURGICAL HISTORY:  Past Surgical History  Procedure Laterality Date  . Inguinal hernia repair Right ~ 2005  . Finger surgery Left ~ 1989    "crushed end of my middle finger off"   . Video bronchoscopy Bilateral 04/26/2013    Procedure: VIDEO BRONCHOSCOPY WITH FLUORO;  Surgeon: Wilhelmina Mcardle, MD;  Location: [facility];  Service: Cardiopulmonary;  Laterality: Bilateral;    REVIEW OF SYSTEMS:   Constitutional: Denies fevers, chills or abnormal weight loss Eyes: Denies blurriness of vision Ears, nose, mouth, throat, and face: Denies mucositis or sore throat Respiratory: He reports cough, dyspnea  But denies wheezes Cardiovascular: Denies palpitation, chest discomfort or lower extremity swelling Gastrointestinal:  Denies nausea, heartburn or change in bowel habits Skin: Denies abnormal skin rashes Lymphatics: Denies new lymphadenopathy or easy bruising Neurological:Denies numbness, tingling or new weaknesses Behavioral/Psych: Mood is stable, no new changes  All other systems were reviewed with the patient and are negative.  PHYSICAL EXAMINATION: ECOG PERFORMANCE STATUS: 1 - Symptomatic but completely ambulatory  Blood pressure 118/82, pulse 101, temperature 98.1 F (36.7 C), temperature source Oral, resp. rate 18, height 5\' 7"  (1.702 m), weight 107 lb 3.2 oz (48.626 kg).  GENERAL:alert, no distress and comfortable, thin chronically ill appearing gentleman on 2 Liters of oxygen. HOH. SKIN: skin color, texture, turgor are normal, no rashes or significant lesions EYES: normal, Conjunctiva are pink and non-injected, sclera clear OROPHARYNX:no exudate, no erythema and lips, buccal mucosa, and tongue normal  NECK: supple, thyroid normal size, non-tender, without  nodularity LYMPH:  no palpable lymphadenopathy in the cervical, axillary or supraclavicular LUNGS: clear to auscultation with normal breathing effort, no wheezes or rhonchi on the left. R lung with decreased breath sound at the bases.  HEART: regular rate & rhythm and no murmurs and no lower extremity edema ABDOMEN:abdomen soft, non-tender and normal bowel sounds Musculoskeletal:no cyanosis of digits and no clubbing  NEURO: alert & oriented x 3 with fluent speech, no focal motor/sensory deficits  Labs:  Lab Results  Component Value Date   WBC 12.1* 06/13/2013   HGB 10.3* 06/13/2013   HCT 31.7* 06/13/2013   MCV 91.5 06/13/2013   PLT 1,086* 06/13/2013   NEUTROABS 10.3* 06/13/2013      Chemistry      Component Value Date/Time   NA 136 06/13/2013 1516   NA 138 05/31/2013 0345   K 6.1 No visable hemolysis, repeated and verified* 06/13/2013 1516   K 3.7 05/31/2013 0345   CL 103 05/31/2013 0345   CO2 28 06/13/2013 1516   CO2 26 05/31/2013 0345   BUN 9.5 06/13/2013 1516   BUN 8 05/31/2013 0345   CREATININE 1.1 06/13/2013 1516   CREATININE 0.83 05/31/2013 0345      Component Value Date/Time   CALCIUM 10.0 06/13/2013 1516   CALCIUM 8.0* 05/31/2013 0345   ALKPHOS 84 06/13/2013 1516   ALKPHOS 70 05/26/2013 0405   AST 30 06/13/2013 1516   AST 12 05/26/2013 0405   ALT 14 06/13/2013 1516   ALT 12 05/26/2013 0405   BILITOT 0.40 06/13/2013 1516   BILITOT 0.5 05/26/2013 0405       Basic Metabolic  Panel:  Recent Labs Lab 06/09/13 1336 06/09/13 1336 06/13/13 1516  NA 139  --  136  K 4.3  --  6.1 No visable hemolysis, repeated and verified*  CO2 29  --  28  GLUCOSE 105  --  116  BUN 5.1*  --  9.5  CREATININE 0.8  --  1.1  CALCIUM 9.1  --  10.0  MG  --  1.8  --    GFR Estimated Creatinine Clearance: 48.5 ml/min (by C-G formula based on Cr of 1.1). Liver Function Tests:  CBC:  Recent Labs Lab 06/09/13 1335 06/13/13 1516  WBC 12.5* 12.1*  NEUTROABS 10.2* 10.3*  HGB 9.6* 10.3*  HCT 29.4* 31.7*  MCV  90.5 91.5  PLT 801* 1,086*    Studies:  No results found.   RADIOGRAPHIC STUDIES: None  PATHOLOGY: Diagnosis 04/26/2013 Bronchus, biopsy, RLL - POORLY DIFFERENTIATED SMALL CELL NEUROENDOCRINE CARCINOMA. Microscopic Comment The biopsies are extensively involved by an atypical infiltrate with histologic features of small cell carcinoma. Immunohistochemistry is performed and the tumor is positive for CD56, Chromogranin, Synaptophysin, Thyroid Transcription Factor - 1, Cytokeratin 7 and Cytokeratin AE1 / AE3. Tumor is negative for Cytokeratin 56 and mostly negative with p63. The histologic and immunophenotypic characteristics are consistent with poorly differentiated small cell neuroendocrine carcinoma. Case discussed with Dr. Alva Garnet on 04/27/13. (JDP:caf 04/28/13) Claudette Laws MD Pathologist, Electronic Signature (Case signed 04/28/2013) Specimen Gross and Clinical Information Specimen(s) Obtained: Bronchus, biopsy, RLL  ASSESSMENT: Althea Charon 63 y.o. male with a history of Anorexia - Plan: CBC with Differential, Basic metabolic panel (Bmet) - [facility], CT Abdomen Pelvis Wo Contrast, CT Chest W Contrast, Ambulatory referral to Gastroenterology  Small cell carcinoma of lung - Plan: CBC with Differential, Basic metabolic panel (Bmet) - [facility], CT Abdomen Pelvis Wo Contrast, CT Chest W Contrast, Ambulatory referral to Gastroenterology  Hyperkalemia - Plan: Basic metabolic panel (Bmet) - [facility]  63 yo male with newly diagnosed small cell carcinoma of the R lung complicated by obstructive pneumonia, PE. ECOG 1. + weight loss.   PLAN:  1. EXTENSIVE STAGE IV SMALL CELL CARCINOMA OF THE R LUNG WITH +BRAIN METASTASES --Dr. Pablo Ledger completed whole brain XRT on 03/25.  We started chemotherapy on 03/25 and he completed  Cycle #1  On cisplatin 80 mg/m2 on day 1, etoposide 100 mg/m2 days 1,2,3  And for a maximum of 4-6 cycles every 21 days.   He awaits for audiology appointment.  Given his recent  hospitalization as detailed above and now complains of hearing fullness and intermittent tinnitus, we  discontinued cisplatin and substitute with carboplatin AUC 5.  In addition, we will decrease etoposide dose by 25% due to profound neutropenia. We will also continue neulasta of Day#4 of his chemotherapy each cycle due to febrile neutropenia. Plan start day for his cycle #2, day #1 is on 06/22/2013.  He still reports worsening gastritis/esophagitis and anorexia.   --We will obtain a CT of abdomen and pelvis and chest given above symptoms to assess response to therapy.   2. Gastritis/esophagitis. --Increased protonix to 40 mg bid (up from daily) and continue zantac doing day. Referral to GI for recommendations and/or EGD.    3. Anorexia. --Likely secondary to #1.  Prescribed Marinol 2.5 mg bid today.  Provided handouts and discussed side-effects including but not limited to confusion, drowsiness.   4. Pain associated with #1, improved. --Patient reported insomnia secondary to pain with deep breathing.  We provided him oxycodone 5 mg by mouth  every 4 hours as need for severe pain. He reports improvement and now uses these as needed.   5. PE (04/21/2013). --CTA of chest noted above consistent with Nonocclusive left upper lobe, right lower lobe pulmonary emboli. Right heart strain.  Continue  xarelto 20 mg daily.   6. Anemia secondary to #1. --Plts have improved so we instructed him to result his Evalina Field.  7. Hypertension. --He will continue lisinopril 10 mg daily as tolerated.  8. Mucositis secondary #1.  --Carafate as tolerated.    9. Emphysema --Continue spiriva hanihaler 18 mcg inhalation daily. Oxygen 2 liters by [place]  10. Follow-up.  --Patient will follow up on 04/29 for consideration of chemotherapy with carbo plus etoposide cycle #2, day #1.   All questions were answered. The patient knows to call the clinic with any problems, questions or concerns. We can certainly see the patient  much sooner if necessary.  I spent 15 minutes counseling the patient face to face. The total time spent in the appointment was 25 minutes.    Concha Norway, MD 06/13/2013 4:51 PM

## 2013-06-13 NOTE — Telephone Encounter (Signed)
gv adn printed appt sched and avs for pt for Apr adn May....gv pt barium

## 2013-06-13 NOTE — Patient Instructions (Signed)
Dronabinol, THC capsules What is this medicine? DRONABINOL (droe NAB i nol) is used to treat nausea and vomiting caused by cancer treatment, especially for those patients who do not respond to other medicines. This medicine is also used to increase appetite in AIDS patients. This medicine may be used for other purposes; ask your health care provider or pharmacist if you have questions. COMMON BRAND NAME(S): Marinol What should I tell my health care provider before I take this medicine? They need to know if you have any of these conditions: -a history of drug or alcohol abuse -heart disease, including angina or irregular heart rate -high or low blood pressure -dizziness or fainting spells on standing -mental health problems (schizophrenia, mania, depression) -an unusual or allergic reaction to dronabinol, marijuana, sesame oil, other medicines, foods, dyes, or preservatives -pregnant or trying to get pregnant -breast-feeding How should I use this medicine? Take this medicine by mouth. Follow the directions on the prescription label. Take your doses at regular intervals. Do not take your medicine more often than directed. Talk to your pediatrician regarding the use of this medicine in children. Special care may be needed. Overdosage: If you think you have taken too much of this medicine contact a poison control center or emergency room at once. NOTE: This medicine is only for you. Do not share this medicine with others. What if I miss a dose? If you miss a dose, take it as soon as you can. If it is almost time for your next dose, take only that dose. Do not take double or extra doses. What may interact with this medicine? Do not take this medicine with any of the following medications: -nabilone This medicine may also interact with the following medications: -alcohol-containing medicines or drinks -amphetamine or other stimulant drugs -barbiturates such as phenobarbital -certain  antidepressants or tranquilizers -certain antihistamines used in cold medicines -cocaine -disulfiram -muscle relaxants -theophylline This list may not describe all possible interactions. Give your health care provider a list of all the medicines, herbs, non-prescription drugs, or dietary supplements you use. Also tell them if you smoke, drink alcohol, or use illegal drugs. Some items may interact with your medicine. What should I watch for while using this medicine? The first time you take this medicine or have an increase in dose make sure there is a responsible person nearby. You may experience mood changes, easy laughter, or other changes in behavior. You may get drowsy or dizzy. Do not drive, use machinery, or do anything that needs mental alertness until you know how this medicine affects you. Do not stand or sit up quickly, especially if you have low blood pressure or if you are an older patient. This reduces the risk of dizzy or fainting spells. Alcohol may interfere with the effect of this medicine. Avoid alcoholic drinks. If you are taking this medicine to improve your appetite, this is only part of your therapy. Discuss what you can eat and ways of improving your diet with your health care professional or nutritionist. Frequent small snacks as well as regular meals can help provide extra calories and protein. Do not smoke marijuana while you are taking this medicine. It is similar to one of the active substances found in marijuana. You are at increased risk of serious heart and/or nervous system side effects if these drugs are used together. What side effects may I notice from receiving this medicine? Side effects that you should report to your doctor or health care professional as soon as  possible: -allergic reactions like skin rash, itching or hives, swelling of the face, lips, or tongue -fast, irregular heartbeat -feeling faint or lightheaded, falls -hallucinations -panic  reactions -slurred speech Side effects that usually do not require medical attention (report to your doctor or health care professional if they continue or are bothersome): -anxiety or nervousness -confusion -nausea, vomiting or diarrhea -stomach upset This list may not describe all possible side effects. Call your doctor for medical advice about side effects. You may report side effects to FDA at 1-800-FDA-1088. Where should I keep my medicine? Keep out of the reach of children. This medicine can be abused. Keep your medicine in a safe place to protect it from theft. Do not share this medicine with anyone. Selling or giving away this medicine is dangerous and against the law. Store in a cool place between 8 and 15 degrees C (46 and 59 degrees F) or in a refrigerator. Avoid freezing. Throw away any unused medicine after the expiration date. NOTE: This sheet is a summary. It may not cover all possible information. If you have questions about this medicine, talk to your doctor, pharmacist, or health care provider.  2014, Elsevier/Gold Standard. (2007-04-28 14:11:58) Hyperkalemia Hyperkalemia is when you have too much potassium in your blood. This can be a life-threatening condition. Potassium is normally removed (excreted) from the body by the kidneys. CAUSES  The potassium level in your body can become too high for the following reasons:  You take in too much potassium. You can do this by:  Using salt substitutes. They contain large amounts of potassium.  Taking potassium supplements from your caregiver. The dose may be too high for you.  Eating foods or taking nutritional products with potassium.  You excrete too little potassium. This can happen if:  Your kidneys are not functioning properly. Kidney (renal) disease is a very common cause of hyperkalemia.  You are taking medicines that lower your excretion of potassium, such as certain diuretic medicines.  You have an adrenal gland  disease called Addison's disease.  You have a urinary tract obstruction, such as kidney stones.  You are on treatment to mechanically clean your blood (dialysis) and you skip a treatment.  You release a high amount of potassium from your cells into your blood. You may have a condition that causes potassium to move from your cells to your bloodstream. This can happen with:  Injury to muscles or other tissues. Most potassium is stored in the muscles.  Severe burns or infections.  Acidic blood plasma (acidosis). Acidosis can result from many diseases, such as uncontrolled diabetes. SYMPTOMS  Usually, there are no symptoms unless the potassium is dangerously high or has risen very quickly. Symptoms may include:  Irregular or very slow heartbeat.  Feeling sick to your stomach (nauseous).  Tiredness (fatigue).  Nerve problems such as tingling of the skin, numbness of the hands or feet, weakness, or paralysis. DIAGNOSIS  A simple blood test can measure the amount of potassium in your body. An electrocardiogram test of the heart can also help make the diagnosis. The heart may beat dangerously fast or slow down and stop beating with severe hyperkalemia.  TREATMENT  Treatment depends on how bad the condition is and on the underlying cause.  If the hyperkalemia is an emergency (causing heart problems or paralysis), many different medicines can be used alone or together to lower the potassium level briefly. This may include an insulin injection even if you are not diabetic. Emergency dialysis may  be needed to remove potassium from the body.  If the hyperkalemia is less severe or dangerous, the underlying cause is treated. This can include taking medicines if needed. Your prescription medicines may be changed. You may also need to take a medicine to help your body get rid of potassium. You may need to eat a diet low in potassium. HOME CARE INSTRUCTIONS   Take medicines and supplements as directed  by your caregiver.  Do not take any over-the-counter medicines, supplements, natural products, herbs, or vitamins without reviewing them with your caregiver. Certain supplements and natural food products can have high amounts of potassium. Other products (such as ibuprofen) can damage weak kidneys and raise your potassium.  You may be asked to do repeat lab tests. Be sure to follow these directions.  If you have kidney disease, you may need to follow a low potassium diet. SEEK MEDICAL CARE IF:   You notice an irregular or very slow heartbeat.  You feel lightheaded.  You develop weakness that is unusual for you. SEEK IMMEDIATE MEDICAL CARE IF:   You have shortness of breath.  You have chest discomfort.  You pass out (faint). MAKE SURE YOU:   Understand these instructions.  Will watch your condition.  Will get help right away if you are not doing well or get worse. Document Released: 01/31/2002 Document Revised: 05/05/2011 Document Reviewed: 07/18/2010 [facility] Patient Information 2014 [place], [place].

## 2013-06-13 NOTE — Telephone Encounter (Signed)
Daughter [name] called. She was describing personality changes of the pt. He is very very irritable, short tempered and sharp. He only wants wife, debbie or debbie's son around him and no one else. She wants [place] to be aware and help them with explanation or medication for pt. She feels she could not bring this up in front of pt at his office visit today or he would get more irritated. This note given to Dr Juliann Mule.

## 2013-06-14 ENCOUNTER — Other Ambulatory Visit (HOSPITAL_BASED_OUTPATIENT_CLINIC_OR_DEPARTMENT_OTHER): Payer: BC Managed Care – PPO

## 2013-06-14 DIAGNOSIS — C7A1 Malignant poorly differentiated neuroendocrine tumors: Secondary | ICD-10-CM

## 2013-06-14 DIAGNOSIS — E875 Hyperkalemia: Secondary | ICD-10-CM

## 2013-06-14 LAB — BASIC METABOLIC PANEL (CC13)
ANION GAP: 13 meq/L — AB (ref 3–11)
BUN: 10.7 mg/dL (ref 7.0–26.0)
CALCIUM: 10 mg/dL (ref 8.4–10.4)
CO2: 27 mEq/L (ref 22–29)
Chloride: 98 mEq/L (ref 98–109)
Creatinine: 1.1 mg/dL (ref 0.7–1.3)
Glucose: 106 mg/dl (ref 70–140)
Potassium: 5.6 mEq/L — ABNORMAL HIGH (ref 3.5–5.1)
SODIUM: 138 meq/L (ref 136–145)

## 2013-06-15 ENCOUNTER — Other Ambulatory Visit: Payer: BC Managed Care – PPO

## 2013-06-15 ENCOUNTER — Ambulatory Visit (HOSPITAL_BASED_OUTPATIENT_CLINIC_OR_DEPARTMENT_OTHER): Payer: BC Managed Care – PPO

## 2013-06-15 ENCOUNTER — Other Ambulatory Visit: Payer: Self-pay

## 2013-06-15 ENCOUNTER — Ambulatory Visit: Payer: BC Managed Care – PPO

## 2013-06-15 ENCOUNTER — Encounter: Payer: BC Managed Care – PPO | Admitting: Nutrition

## 2013-06-15 ENCOUNTER — Telehealth: Payer: Self-pay

## 2013-06-15 ENCOUNTER — Ambulatory Visit (HOSPITAL_COMMUNITY)
Admission: RE | Admit: 2013-06-15 | Discharge: 2013-06-15 | Disposition: A | Discharge status: Home / Self Care | Payer: BC Managed Care – PPO | Source: Ambulatory Visit | Attending: Internal Medicine | Admitting: Internal Medicine

## 2013-06-15 ENCOUNTER — Other Ambulatory Visit: Payer: Self-pay | Admitting: Internal Medicine

## 2013-06-15 ENCOUNTER — Encounter (HOSPITAL_COMMUNITY): Payer: Self-pay

## 2013-06-15 VITALS — BP 145/85 | HR 111 | Temp 97.2°F | Resp 18

## 2013-06-15 DIAGNOSIS — R1013 Epigastric pain: Secondary | ICD-10-CM | POA: Insufficient documentation

## 2013-06-15 DIAGNOSIS — C7A1 Malignant poorly differentiated neuroendocrine tumors: Secondary | ICD-10-CM

## 2013-06-15 DIAGNOSIS — R630 Anorexia: Secondary | ICD-10-CM

## 2013-06-15 DIAGNOSIS — J984 Other disorders of lung: Secondary | ICD-10-CM | POA: Insufficient documentation

## 2013-06-15 DIAGNOSIS — R634 Abnormal weight loss: Secondary | ICD-10-CM | POA: Insufficient documentation

## 2013-06-15 DIAGNOSIS — C3490 Malignant neoplasm of unspecified part of unspecified bronchus or lung: Secondary | ICD-10-CM | POA: Insufficient documentation

## 2013-06-15 DIAGNOSIS — C7B8 Other secondary neuroendocrine tumors: Secondary | ICD-10-CM

## 2013-06-15 DIAGNOSIS — R222 Localized swelling, mass and lump, trunk: Secondary | ICD-10-CM | POA: Insufficient documentation

## 2013-06-15 DIAGNOSIS — R599 Enlarged lymph nodes, unspecified: Secondary | ICD-10-CM | POA: Insufficient documentation

## 2013-06-15 DIAGNOSIS — J438 Other emphysema: Secondary | ICD-10-CM | POA: Insufficient documentation

## 2013-06-15 DIAGNOSIS — I2510 Atherosclerotic heart disease of native coronary artery without angina pectoris: Secondary | ICD-10-CM | POA: Insufficient documentation

## 2013-06-15 DIAGNOSIS — N289 Disorder of kidney and ureter, unspecified: Secondary | ICD-10-CM | POA: Insufficient documentation

## 2013-06-15 DIAGNOSIS — J90 Pleural effusion, not elsewhere classified: Secondary | ICD-10-CM | POA: Insufficient documentation

## 2013-06-15 HISTORY — DX: Malignant (primary) neoplasm, unspecified: C80.1

## 2013-06-15 LAB — BASIC METABOLIC PANEL (CC13)
Anion Gap: 13 mEq/L — ABNORMAL HIGH (ref 3–11)
BUN: 12 mg/dL (ref 7.0–26.0)
CO2: 25 meq/L (ref 22–29)
CREATININE: 1 mg/dL (ref 0.7–1.3)
Calcium: 9.9 mg/dL (ref 8.4–10.4)
Chloride: 96 mEq/L — ABNORMAL LOW (ref 98–109)
Glucose: 84 mg/dl (ref 70–140)
POTASSIUM: 5.4 meq/L — AB (ref 3.5–5.1)
Sodium: 134 mEq/L — ABNORMAL LOW (ref 136–145)

## 2013-06-15 MED ORDER — SODIUM CHLORIDE 0.9 % IV SOLN
INTRAVENOUS | Status: DC
  Administered 2013-06-15: 12:00:00 via INTRAVENOUS

## 2013-06-15 MED ORDER — IOHEXOL 300 MG/ML  SOLN
80.0000 mL | Freq: Once | INTRAMUSCULAR | Status: AC | PRN
  Administered 2013-06-15: 80 mL via INTRAVENOUS

## 2013-06-15 MED ORDER — IOHEXOL 300 MG/ML  SOLN: 100.0000 mL | Freq: Once | INTRAMUSCULAR | Status: DC | PRN

## 2013-06-15 NOTE — Patient Instructions (Signed)
Dehydration, Adult Dehydration is when you lose more fluids from the body than you take in. Vital organs like the kidneys, brain, and heart cannot function without a proper amount of fluids and salt. Any loss of fluids from the body can cause dehydration.  CAUSES   Vomiting.  Diarrhea.  Excessive sweating.  Excessive urine output.  Fever. SYMPTOMS  Mild dehydration  Thirst.  Dry lips.  Slightly dry mouth. Moderate dehydration  Very dry mouth.  Sunken eyes.  Skin does not bounce back quickly when lightly pinched and released.  Dark urine and decreased urine production.  Decreased tear production.  Headache. Severe dehydration  Very dry mouth.  Extreme thirst.  Rapid, weak pulse (more than 100 beats per minute at rest).  Cold hands and feet.  Not able to sweat in spite of heat and temperature.  Rapid breathing.  Blue lips.  Confusion and lethargy.  Difficulty being awakened.  Minimal urine production.  No tears. DIAGNOSIS  Your caregiver will diagnose dehydration based on your symptoms and your exam. Blood and urine tests will help confirm the diagnosis. The diagnostic evaluation should also identify the cause of dehydration. TREATMENT  Treatment of mild or moderate dehydration can often be done at home by increasing the amount of fluids that you drink. It is best to drink small amounts of fluid more often. Drinking too much at one time can make vomiting worse. Refer to the home care instructions below. Severe dehydration needs to be treated at the hospital where you will probably be given intravenous (IV) fluids that contain water and electrolytes. HOME CARE INSTRUCTIONS   Ask your caregiver about specific rehydration instructions.  Drink enough fluids to keep your urine clear or pale yellow.  Drink small amounts frequently if you have nausea and vomiting.  Eat as you normally do.  Avoid:  Foods or drinks high in sugar.  Carbonated  drinks.  Juice.  Extremely hot or cold fluids.  Drinks with caffeine.  Fatty, greasy foods.  Alcohol.  Tobacco.  Overeating.  Gelatin desserts.  Wash your hands well to avoid spreading bacteria and viruses.  Only take over-the-counter or prescription medicines for pain, discomfort, or fever as directed by your caregiver.  Ask your caregiver if you should continue all prescribed and over-the-counter medicines.  Keep all follow-up appointments with your caregiver. SEEK MEDICAL CARE IF:  You have abdominal pain and it increases or stays in one area (localizes).  You have a rash, stiff neck, or severe headache.  You are irritable, sleepy, or difficult to awaken.  You are weak, dizzy, or extremely thirsty. SEEK IMMEDIATE MEDICAL CARE IF:   You are unable to keep fluids down or you get worse despite treatment.  You have frequent episodes of vomiting or diarrhea.  You have blood or green matter (bile) in your vomit.  You have blood in your stool or your stool looks black and tarry.  You have not urinated in 6 to 8 hours, or you have only urinated a small amount of very dark urine.  You have a fever.  You faint. MAKE SURE YOU:   Understand these instructions.  Will watch your condition.  Will get help right away if you are not doing well or get worse. Document Released: 02/10/2005 Document Revised: 05/05/2011 Document Reviewed: 09/30/2010 [employer] Patient Information 2014 [employer].  

## 2013-06-15 NOTE — Telephone Encounter (Signed)
[name] called saying her dad has not drunk maybe 6 oz in 24 hours and she is concerned about dehydration.[name] He is not eating. He has not used the bathroom. The mylanta and carafate are not helping with the epigastric pain. Dr Juliann Mule ordered hydration and labs today. Pt to bring CT contrast with him and drink it while getting hydrated then continue with CT at 230.

## 2013-06-16 ENCOUNTER — Ambulatory Visit: Payer: BC Managed Care – PPO

## 2013-06-16 ENCOUNTER — Other Ambulatory Visit: Payer: Self-pay | Admitting: Internal Medicine

## 2013-06-16 ENCOUNTER — Encounter: Payer: Self-pay | Admitting: Gastroenterology

## 2013-06-16 ENCOUNTER — Ambulatory Visit: Payer: BC Managed Care – PPO | Attending: Audiology | Admitting: Audiology

## 2013-06-16 ENCOUNTER — Other Ambulatory Visit: Payer: Self-pay

## 2013-06-16 ENCOUNTER — Telehealth: Payer: Self-pay | Admitting: Internal Medicine

## 2013-06-16 DIAGNOSIS — H9190 Unspecified hearing loss, unspecified ear: Secondary | ICD-10-CM | POA: Diagnosis not present

## 2013-06-16 DIAGNOSIS — Z5189 Encounter for other specified aftercare: Secondary | ICD-10-CM | POA: Insufficient documentation

## 2013-06-16 DIAGNOSIS — C3490 Malignant neoplasm of unspecified part of unspecified bronchus or lung: Secondary | ICD-10-CM

## 2013-06-16 NOTE — Procedures (Signed)
Name:  Ricky Villanueva DOB:   [birth year] MRN:    [id number] Date of Evaluation:  06/16/2013 Referring Physician: [name], MD  History: Patient referred for audiological evaluation after diagnosis of small cell lung cancer, radiation and chemotherapy (cisplatin).  Involvement also reported on pons and right parietal lobe according to his daughter and wife who accompanied him.  Last date of chemotherapy was 3/27.  Patient reports that significant change in his hearing occurred immediately during treatment.  He has a past history of occupational noise exposure with the use of ear protection.  He reportedly has some degree of hearing loss documented during his last annual employment audiogram in 2014, but feels it was mild in comparison to his current status.  He reports significant difficulty understanding conversational speech when speakers are not facing him and feels he is relying on lipreading to "get by".  Lastly, he reports a bilateral "roar" that is constant.  He presents today on oxygen.    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 Ultra high frequency thresholds were obtained at 75dBHL on the right side at 9000Hz  and 10000Hz  and at 80dBHL and 85dBHL on the right side at 9000Hz  and 10000Hz  respectively.   Speech reception thresholds were consistent with the pure tone results indicative of good test reliability.  Speech recognition testing was conducted in each ear at a comfortable listening level (70BHL), with scores of 84% in the right ear and 92% in the left ear.   Impedance audiometry was utilized and a Type A tympanograms were obtained bilaterally indicative of good middle ear fucntion.  Acoustic reflexes were not tested as a seal could not be maintained.    Distortion Product Otoacoustic Emissions (DPOAEs) were tested from 2,000Hz  - 10,000Hz  and were absent on the right side and absent  on the left side suggestion poor outer hair cell function within the inner ear.  Impressions:  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  His speech understanding is good on the left side (92%) and moderately affected on the right side (84%) at an elevated (70dBHL) level.  Middle ear function is normal on both sides.  Recommendations:  The family states that the treatment plan will not include Cisplatin from this point forward and is unsure of what chemotherapy agent will be utilized.  [name] was advised to avoid loud noises and report changes in his hearing to his physician.  We will be happy to monitor his hearing acuity during treatment as protocols warrant.  After treatment is completed [name] may want to pursue amplification on a trial basis.      Ivonne Andrew Leanna Battles- Audiology 06/16/2013 9:33 AM

## 2013-06-16 NOTE — Telephone Encounter (Signed)
I spoke with Ricky Villanueva making them aware of fluid appointment tomorrow at noon.  I suggested that they have me paged and we can discuss the results of the CT scan and hearing test.

## 2013-06-16 NOTE — Telephone Encounter (Signed)
lvm for pt regarding to Dr. Ardis Hughs appt on June 16th @ 9am

## 2013-06-16 NOTE — Telephone Encounter (Signed)
appt already scheduled. No new orders.

## 2013-06-16 NOTE — Patient Instructions (Addendum)
  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  His speech understanding is good on the left side (92%) and moderately affected on the right side (84%) at an elevated (70dBHL) level.  Middle ear function is normal on both sides.  Recommendations:  The family states that the treatment plan will not include Cisplatin from this point forward and is unsure of what chemotherapy agent will be utilized.  Ricky Villanueva was advised to avoid loud noises and report changes in his hearing to his physician.  We will be happy to monitor his hearing acuity during treatment as protocols warrant.  After treatment is completed Ricky Villanueva may want to pursue amplification on a trial basis.      Ivonne Andrew Leanna Battles- Audiology 06/16/2013 9:33 AM

## 2013-06-17 ENCOUNTER — Ambulatory Visit (HOSPITAL_BASED_OUTPATIENT_CLINIC_OR_DEPARTMENT_OTHER): Payer: BC Managed Care – PPO

## 2013-06-17 ENCOUNTER — Ambulatory Visit: Payer: BC Managed Care – PPO

## 2013-06-17 ENCOUNTER — Telehealth: Payer: Self-pay

## 2013-06-17 VITALS — BP 140/82 | HR 85 | Temp 97.7°F | Resp 16

## 2013-06-17 DIAGNOSIS — C3490 Malignant neoplasm of unspecified part of unspecified bronchus or lung: Secondary | ICD-10-CM

## 2013-06-17 DIAGNOSIS — C7A1 Malignant poorly differentiated neuroendocrine tumors: Secondary | ICD-10-CM

## 2013-06-17 DIAGNOSIS — E860 Dehydration: Secondary | ICD-10-CM

## 2013-06-17 DIAGNOSIS — C7B8 Other secondary neuroendocrine tumors: Secondary | ICD-10-CM

## 2013-06-17 MED ORDER — SODIUM CHLORIDE 0.9 % IV SOLN
INTRAVENOUS | Status: AC
  Administered 2013-06-17: 13:00:00 via INTRAVENOUS

## 2013-06-17 NOTE — Progress Notes (Signed)
Pt drank approx 1-2 oz water while in infusion room for IVF today. He refused all other offers for food or other drink. He reports epigastric burning with drinking. Dr. Juliann Mule aware and pt did have CT CAP this week. He does have pain medication at home that gives him temporary relief. This RN encouraged increased po intake and patient verbalizes understanding. After discussion with Dr. Benay Spice, on-call MD, patient scheduled for additional IVF appointment Saturday 06/18/13. AVS with new appt given to patient and family. Pt to see GI specialist on Monday and will return for lab/MD/infusion appt on Wednesday 06/22/13. Patient and family know to call [place] with questions or concerns, especially regarding patient's hydration status, they all verbalize understanding.

## 2013-06-17 NOTE — Patient Instructions (Signed)
Dehydration, Adult Dehydration is when you lose more fluids from the body than you take in. Vital organs like the kidneys, brain, and heart cannot function without a proper amount of fluids and salt. Any loss of fluids from the body can cause dehydration.  CAUSES   Vomiting.  Diarrhea.  Excessive sweating.  Excessive urine output.  Fever. SYMPTOMS  Mild dehydration  Thirst.  Dry lips.  Slightly dry mouth. Moderate dehydration  Very dry mouth.  Sunken eyes.  Skin does not bounce back quickly when lightly pinched and released.  Dark urine and decreased urine production.  Decreased tear production.  Headache. Severe dehydration  Very dry mouth.  Extreme thirst.  Rapid, weak pulse (more than 100 beats per minute at rest).  Cold hands and feet.  Not able to sweat in spite of heat and temperature.  Rapid breathing.  Blue lips.  Confusion and lethargy.  Difficulty being awakened.  Minimal urine production.  No tears. DIAGNOSIS  Your caregiver will diagnose dehydration based on your symptoms and your exam. Blood and urine tests will help confirm the diagnosis. The diagnostic evaluation should also identify the cause of dehydration. TREATMENT  Treatment of mild or moderate dehydration can often be done at home by increasing the amount of fluids that you drink. It is best to drink small amounts of fluid more often. Drinking too much at one time can make vomiting worse. Refer to the home care instructions below. Severe dehydration needs to be treated at the hospital where you will probably be given intravenous (IV) fluids that contain water and electrolytes. HOME CARE INSTRUCTIONS   Ask your caregiver about specific rehydration instructions.  Drink enough fluids to keep your urine clear or pale yellow.  Drink small amounts frequently if you have nausea and vomiting.  Eat as you normally do.  Avoid:  Foods or drinks high in sugar.  Carbonated  drinks.  Juice.  Extremely hot or cold fluids.  Drinks with caffeine.  Fatty, greasy foods.  Alcohol.  Tobacco.  Overeating.  Gelatin desserts.  Wash your hands well to avoid spreading bacteria and viruses.  Only take over-the-counter or prescription medicines for pain, discomfort, or fever as directed by your caregiver.  Ask your caregiver if you should continue all prescribed and over-the-counter medicines.  Keep all follow-up appointments with your caregiver. SEEK MEDICAL CARE IF:  You have abdominal pain and it increases or stays in one area (localizes).  You have a rash, stiff neck, or severe headache.  You are irritable, sleepy, or difficult to awaken.  You are weak, dizzy, or extremely thirsty. SEEK IMMEDIATE MEDICAL CARE IF:   You are unable to keep fluids down or you get worse despite treatment.  You have frequent episodes of vomiting or diarrhea.  You have blood or green matter (bile) in your vomit.  You have blood in your stool or your stool looks black and tarry.  You have not urinated in 6 to 8 hours, or you have only urinated a small amount of very dark urine.  You have a fever.  You faint. MAKE SURE YOU:   Understand these instructions.  Will watch your condition.  Will get help right away if you are not doing well or get worse. Document Released: 02/10/2005 Document Revised: 05/05/2011 Document Reviewed: 09/30/2010 [employer] Patient Information 2014 [employer].  

## 2013-06-17 NOTE — Telephone Encounter (Signed)
lvm explaining if Dr Henrene Pastor deems it necessary for pt to have fluids on Monday, they may do it at [facility], or they may coordinate with [facility] for fluids. We will keep chemo treatments on the books until he sees [place] on Wed. That will also be the time to ask about audiology concerns.

## 2013-06-18 ENCOUNTER — Ambulatory Visit (HOSPITAL_BASED_OUTPATIENT_CLINIC_OR_DEPARTMENT_OTHER): Payer: BC Managed Care – PPO

## 2013-06-18 VITALS — BP 125/76 | HR 106 | Temp 97.5°F

## 2013-06-18 DIAGNOSIS — E860 Dehydration: Secondary | ICD-10-CM

## 2013-06-18 DIAGNOSIS — C7A1 Malignant poorly differentiated neuroendocrine tumors: Secondary | ICD-10-CM

## 2013-06-18 DIAGNOSIS — C7B8 Other secondary neuroendocrine tumors: Secondary | ICD-10-CM

## 2013-06-18 MED ORDER — LORAZEPAM 1 MG PO TABS
1.0000 mg | ORAL_TABLET | Freq: Once | ORAL | Status: AC
  Administered 2013-06-18: 0.5 mg via ORAL

## 2013-06-18 MED ORDER — SODIUM CHLORIDE 0.9 % IV SOLN
Freq: Once | INTRAVENOUS | Status: AC
  Administered 2013-06-18: 09:00:00 via INTRAVENOUS

## 2013-06-18 MED ORDER — LORAZEPAM 1 MG PO TABS
ORAL_TABLET | ORAL | Status: AC
  Filled 2013-06-18: qty 1

## 2013-06-18 NOTE — Patient Instructions (Signed)
Dehydration, Adult Dehydration is when you lose more fluids from the body than you take in. Vital organs like the kidneys, brain, and heart cannot function without a proper amount of fluids and salt. Any loss of fluids from the body can cause dehydration.  CAUSES   Vomiting.  Diarrhea.  Excessive sweating.  Excessive urine output.  Fever. SYMPTOMS  Mild dehydration  Thirst.  Dry lips.  Slightly dry mouth. Moderate dehydration  Very dry mouth.  Sunken eyes.  Skin does not bounce back quickly when lightly pinched and released.  Dark urine and decreased urine production.  Decreased tear production.  Headache. Severe dehydration  Very dry mouth.  Extreme thirst.  Rapid, weak pulse (more than 100 beats per minute at rest).  Cold hands and feet.  Not able to sweat in spite of heat and temperature.  Rapid breathing.  Blue lips.  Confusion and lethargy.  Difficulty being awakened.  Minimal urine production.  No tears. DIAGNOSIS  Your caregiver will diagnose dehydration based on your symptoms and your exam. Blood and urine tests will help confirm the diagnosis. The diagnostic evaluation should also identify the cause of dehydration. TREATMENT  Treatment of mild or moderate dehydration can often be done at home by increasing the amount of fluids that you drink. It is best to drink small amounts of fluid more often. Drinking too much at one time can make vomiting worse. Refer to the home care instructions below. Severe dehydration needs to be treated at the hospital where you will probably be given intravenous (IV) fluids that contain water and electrolytes. HOME CARE INSTRUCTIONS   Ask your caregiver about specific rehydration instructions.  Drink enough fluids to keep your urine clear or pale yellow.  Drink small amounts frequently if you have nausea and vomiting.  Eat as you normally do.  Avoid:  Foods or drinks high in sugar.  Carbonated  drinks.  Juice.  Extremely hot or cold fluids.  Drinks with caffeine.  Fatty, greasy foods.  Alcohol.  Tobacco.  Overeating.  Gelatin desserts.  Wash your hands well to avoid spreading bacteria and viruses.  Only take over-the-counter or prescription medicines for pain, discomfort, or fever as directed by your caregiver.  Ask your caregiver if you should continue all prescribed and over-the-counter medicines.  Keep all follow-up appointments with your caregiver. SEEK MEDICAL CARE IF:  You have abdominal pain and it increases or stays in one area (localizes).  You have a rash, stiff neck, or severe headache.  You are irritable, sleepy, or difficult to awaken.  You are weak, dizzy, or extremely thirsty. SEEK IMMEDIATE MEDICAL CARE IF:   You are unable to keep fluids down or you get worse despite treatment.  You have frequent episodes of vomiting or diarrhea.  You have blood or green matter (bile) in your vomit.  You have blood in your stool or your stool looks black and tarry.  You have not urinated in 6 to 8 hours, or you have only urinated a small amount of very dark urine.  You have a fever.  You faint. MAKE SURE YOU:   Understand these instructions.  Will watch your condition.  Will get help right away if you are not doing well or get worse. Document Released: 02/10/2005 Document Revised: 05/05/2011 Document Reviewed: 09/30/2010 [employer] Patient Information 2014 [employer].  

## 2013-06-18 NOTE — Progress Notes (Signed)
Difficulty starting PIV due to veins fragile, blew w/ first 3 sticks.  Pt c/o feeling nauseated and light headed w/ IV sticks.  He laid back and was given ativan 0.5 mg PO as ordered for nausea/ anxiety prior to IV stick.  Pt states nausea relieved after about 10 minutes and tolerated last IV stick to Right AC w/o difficutly.

## 2013-06-20 ENCOUNTER — Ambulatory Visit (HOSPITAL_BASED_OUTPATIENT_CLINIC_OR_DEPARTMENT_OTHER): Payer: BC Managed Care – PPO

## 2013-06-20 ENCOUNTER — Ambulatory Visit (INDEPENDENT_AMBULATORY_CARE_PROVIDER_SITE_OTHER): Payer: BC Managed Care – PPO | Admitting: Internal Medicine

## 2013-06-20 ENCOUNTER — Other Ambulatory Visit: Payer: Self-pay | Admitting: Medical Oncology

## 2013-06-20 ENCOUNTER — Encounter: Payer: Self-pay | Admitting: Internal Medicine

## 2013-06-20 VITALS — BP 118/83 | HR 77 | Temp 97.8°F | Resp 18

## 2013-06-20 VITALS — BP 90/60 | HR 68 | Ht 67.0 in | Wt 105.6 lb

## 2013-06-20 DIAGNOSIS — C7A1 Malignant poorly differentiated neuroendocrine tumors: Secondary | ICD-10-CM

## 2013-06-20 DIAGNOSIS — R630 Anorexia: Secondary | ICD-10-CM

## 2013-06-20 DIAGNOSIS — E860 Dehydration: Secondary | ICD-10-CM

## 2013-06-20 DIAGNOSIS — C3490 Malignant neoplasm of unspecified part of unspecified bronchus or lung: Secondary | ICD-10-CM

## 2013-06-20 DIAGNOSIS — R109 Unspecified abdominal pain: Secondary | ICD-10-CM

## 2013-06-20 DIAGNOSIS — C7B8 Other secondary neuroendocrine tumors: Secondary | ICD-10-CM

## 2013-06-20 MED ORDER — SODIUM CHLORIDE 0.9 % IV SOLN
Freq: Once | INTRAVENOUS | Status: AC
  Administered 2013-06-20: 10:00:00 via INTRAVENOUS

## 2013-06-20 NOTE — Progress Notes (Signed)
HISTORY OF PRESENT ILLNESS:  Ricky Villanueva is a 63 y.o. 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 Patient is sent today by his oncologist regarding postprandial burning discomfort. Patient has a long history of GERD for which she generally takes once daily PPI. His gastroenterologist, Dr. Doristine Mango, in [facility] perform colonoscopy June 2014. This was negative except for diminutive rectal polyp. The patient began radiation therapy 05/02/2013 through 05/18/2013. He received his first session of chemotherapy March 25 through 05/20/2013. At that time he began to develop this postprandial epigastric burning discomfort which is focal. Generally lasts for a few seconds. Will return with recurrent eating. This agitates him. No odynophagia or dysphagia. Recently had his PPI increase to twice a day and at night H2 receptor antagonist. No GERD symptoms. Recent CT scan of the chest abdomen and pelvis did not reveal any significant abdominal abnormalities. He is accompanied today by his family. He states that he is slightly improving further needs away from his chemotherapy. There are concerns, however, that he cannot continue with his therapy if he cannot eat. He was recently prescribed Marinol, which is yet to start. Also on narcotics.  REVIEW OF SYSTEMS:  All non-GI ROS negative except for sinus and allergy trouble, cough, hemoptysis, fatigue, fever, hearing problems, shortness of breath, hoarseness, ankle edema  Past Medical History  Diagnosis Date  . Hyperlipidemia   . Hypertension   . GERD (gastroesophageal reflux disease)   . Respiratory failure with hypoxia 04/21/2013    secondary to pneumonia/notes 04/21/2013  . Pulmonary embolism     "got one in there now" (04/21/2013)  . COPD  (chronic obstructive pulmonary disease)   . Pneumonia 2/?01/2014    "wouldn't get better; hospitalized 04/21/2013)  . Arthritis     "minor in my right hand" (04/21/2013)  . Cancer     lung ca dx'd 03/2013    Past Surgical History  Procedure Laterality Date  . Inguinal hernia repair Right ~ 2005  . Finger surgery Left ~ 1989    "crushed end of my middle finger off"   . Video bronchoscopy Bilateral 04/26/2013    Procedure: VIDEO BRONCHOSCOPY WITH FLUORO;  Surgeon: Wilhelmina Mcardle, MD;  Location: [facility] ENDOSCOPY;  Service: Cardiopulmonary;  Laterality: Bilateral;    Social History CRIT OBREMSKI  reports that he quit smoking about 17 months ago. His smoking use included Cigarettes. He has a 94 pack-year smoking history. He has never used smokeless tobacco. He reports that he does not drink alcohol or use illicit drugs.  family history includes Breast cancer in his sister.  Allergies  Allergen Reactions  . Augmentin [Amoxicillin-Pot Clavulanate] Itching and Rash    rash       PHYSICAL EXAMINATION: Vital signs: BP 90/60  Pulse 68  Ht 5\' 7"  (1.702 m)  Wt 105 lb 9.6 oz (47.9 kg)  BMI 16.54 kg/m2  Constitutional: Chronically ill-appearing thin male, no acute distress. Nasal cannula oxygen in place Psychiatric: alert and oriented x3, irritable Eyes: extraocular movements intact, anicteric, conjunctiva pink Mouth: oral pharynx moist, no lesions. No thrush Neck: supple no lymphadenopathy Cardiovascular: heart regular rate and rhythm, no murmur Lungs: clear to auscultation bilaterally. Breath sounds are distant Abdomen: soft, nontender, nondistended, no obvious ascites, no peritoneal signs, normal bowel sounds, no organomegaly Rectal: Not performed Extremities: no lower extremity  edema bilaterally Skin: no lesions on visible extremities Neuro: No focal deficits.   ASSESSMENT:  #1. Postprandial burning discomfort. Almost certainly a side effect of his recent therapies. On high-dose PPI  and H2 receptor antagonist which would treat any acid peptic disorders. No visible abnormalities on imaging. Negative colonoscopy last year. #2. Advance small cell lung cancer #3. Multiple medical problems #4. GERD. No symptoms on PPI #5. History of colon polyps. Surveillance up-to-date. GI doctor elsewhere   PLAN:  #1. Continue high-dose PPI and H2 receptor antagonist #2. Encouraged to use Marinol that was prescribed (they asked me) #3. Schedule barium upper GI series to evaluate the upper GI mucosal. Rule out any unsuspected obstructing or ulcerative lesion of significance. Not proceeding directly with endoscopy as likelihood of significant findings is low and his procedural risk is high given his pulmonary disease. If upper GI series unremarkable, then returned oncologist. Treatment will be symptomatic therapies.

## 2013-06-20 NOTE — Patient Instructions (Signed)
You have been scheduled for an upper GI series at [facility] in the radiology department tomorrow, 06/21/2013 at 10:30am.  Arrive at 10:15am and do not have anything to eat or drink after midnight.   If you need to reschedule, the phone number is: [phone]

## 2013-06-20 NOTE — Patient Instructions (Signed)
Dehydration, Adult Dehydration is when you lose more fluids from the body than you take in. Vital organs like the kidneys, brain, and heart cannot function without a proper amount of fluids and salt. Any loss of fluids from the body can cause dehydration.  CAUSES   Vomiting.  Diarrhea.  Excessive sweating.  Excessive urine output.  Fever. SYMPTOMS  Mild dehydration  Thirst.  Dry lips.  Slightly dry mouth. Moderate dehydration  Very dry mouth.  Sunken eyes.  Skin does not bounce back quickly when lightly pinched and released.  Dark urine and decreased urine production.  Decreased tear production.  Headache. Severe dehydration  Very dry mouth.  Extreme thirst.  Rapid, weak pulse (more than 100 beats per minute at rest).  Cold hands and feet.  Not able to sweat in spite of heat and temperature.  Rapid breathing.  Blue lips.  Confusion and lethargy.  Difficulty being awakened.  Minimal urine production.  No tears. DIAGNOSIS  Your caregiver will diagnose dehydration based on your symptoms and your exam. Blood and urine tests will help confirm the diagnosis. The diagnostic evaluation should also identify the cause of dehydration. TREATMENT  Treatment of mild or moderate dehydration can often be done at home by increasing the amount of fluids that you drink. It is best to drink small amounts of fluid more often. Drinking too much at one time can make vomiting worse. Refer to the home care instructions below. Severe dehydration needs to be treated at the hospital where you will probably be given intravenous (IV) fluids that contain water and electrolytes. HOME CARE INSTRUCTIONS   Ask your caregiver about specific rehydration instructions.  Drink enough fluids to keep your urine clear or pale yellow.  Drink small amounts frequently if you have nausea and vomiting.  Eat as you normally do.  Avoid:  Foods or drinks high in sugar.  Carbonated  drinks.  Juice.  Extremely hot or cold fluids.  Drinks with caffeine.  Fatty, greasy foods.  Alcohol.  Tobacco.  Overeating.  Gelatin desserts.  Wash your hands well to avoid spreading bacteria and viruses.  Only take over-the-counter or prescription medicines for pain, discomfort, or fever as directed by your caregiver.  Ask your caregiver if you should continue all prescribed and over-the-counter medicines.  Keep all follow-up appointments with your caregiver. SEEK MEDICAL CARE IF:  You have abdominal pain and it increases or stays in one area (localizes).  You have a rash, stiff neck, or severe headache.  You are irritable, sleepy, or difficult to awaken.  You are weak, dizzy, or extremely thirsty. SEEK IMMEDIATE MEDICAL CARE IF:   You are unable to keep fluids down or you get worse despite treatment.  You have frequent episodes of vomiting or diarrhea.  You have blood or green matter (bile) in your vomit.  You have blood in your stool or your stool looks black and tarry.  You have not urinated in 6 to 8 hours, or you have only urinated a small amount of very dark urine.  You have a fever.  You faint. MAKE SURE YOU:   Understand these instructions.  Will watch your condition.  Will get help right away if you are not doing well or get worse. Document Released: 02/10/2005 Document Revised: 05/05/2011 Document Reviewed: 09/30/2010 [employer] Patient Information 2014 [employer].  

## 2013-06-21 ENCOUNTER — Telehealth: Payer: Self-pay | Admitting: Medical Oncology

## 2013-06-21 ENCOUNTER — Other Ambulatory Visit: Payer: Self-pay | Admitting: Medical Oncology

## 2013-06-21 ENCOUNTER — Ambulatory Visit (HOSPITAL_COMMUNITY)
Admission: RE | Admit: 2013-06-21 | Discharge: 2013-06-21 | Disposition: A | Discharge status: Home / Self Care | Payer: BC Managed Care – PPO | Source: Ambulatory Visit | Attending: Internal Medicine | Admitting: Internal Medicine

## 2013-06-21 ENCOUNTER — Ambulatory Visit (HOSPITAL_BASED_OUTPATIENT_CLINIC_OR_DEPARTMENT_OTHER): Payer: BC Managed Care – PPO

## 2013-06-21 ENCOUNTER — Encounter: Payer: Self-pay | Admitting: Radiation Oncology

## 2013-06-21 VITALS — BP 131/73 | HR 82 | Temp 97.7°F | Resp 20

## 2013-06-21 DIAGNOSIS — C3490 Malignant neoplasm of unspecified part of unspecified bronchus or lung: Secondary | ICD-10-CM

## 2013-06-21 DIAGNOSIS — E860 Dehydration: Secondary | ICD-10-CM

## 2013-06-21 DIAGNOSIS — Z85118 Personal history of other malignant neoplasm of bronchus and lung: Secondary | ICD-10-CM | POA: Insufficient documentation

## 2013-06-21 DIAGNOSIS — R1013 Epigastric pain: Secondary | ICD-10-CM | POA: Insufficient documentation

## 2013-06-21 DIAGNOSIS — C7A1 Malignant poorly differentiated neuroendocrine tumors: Secondary | ICD-10-CM

## 2013-06-21 DIAGNOSIS — R109 Unspecified abdominal pain: Secondary | ICD-10-CM

## 2013-06-21 DIAGNOSIS — C7B8 Other secondary neuroendocrine tumors: Secondary | ICD-10-CM

## 2013-06-21 MED ORDER — ONDANSETRON 8 MG/NS 50 ML IVPB
INTRAVENOUS | Status: AC
  Filled 2013-06-21: qty 8

## 2013-06-21 MED ORDER — SODIUM CHLORIDE 0.9 % IV SOLN
Freq: Once | INTRAVENOUS | Status: AC
  Administered 2013-06-21: 12:00:00 via INTRAVENOUS

## 2013-06-21 MED ORDER — ONDANSETRON 8 MG/50ML IVPB (CHCC)
8.0000 mg | Freq: Once | INTRAVENOUS | Status: AC
  Administered 2013-06-21: 8 mg via INTRAVENOUS

## 2013-06-21 NOTE — Patient Instructions (Signed)
Dehydration, Adult  Dehydration means your body does not have as much fluid as it needs. Your kidneys, brain, and heart will not work properly without the right amount of fluids and salt.   HOME CARE   Ask your doctor how to replace body fluid losses (rehydrate).   Drink enough fluids to keep your pee (urine) clear or pale yellow.   Drink small amounts of fluids often if you feel sick to your stomach (nauseous) or throw up (vomit).   Eat like you normally do.   Avoid:   Foods or drinks high in sugar.   Bubbly (carbonated) drinks.   Juice.   Very hot or cold fluids.   Drinks with caffeine.   Fatty, greasy foods.   Alcohol.   Tobacco.   Eating too much.   Gelatin desserts.   Wash your hands to avoid spreading germs (bacteria, viruses).   Only take medicine as told by your doctor.   Keep all doctor visits as told.  GET HELP RIGHT AWAY IF:    You cannot drink something without throwing up.   You get worse even with treatment.   Your vomit has blood in it or looks greenish.   Your poop (stool) has blood in it or looks black and tarry.   You have not peed in 6 to 8 hours.   You pee a small amount of very dark pee.   You have a fever.   You pass out (faint).   You have belly (abdominal) pain that gets worse or stays in one spot (localizes).   You have a rash, stiff neck, or bad headache.   You get easily annoyed, sleepy, or are hard to wake up.   You feel weak, dizzy, or very thirsty.  MAKE SURE YOU:    Understand these instructions.   Will watch your condition.   Will get help right away if you are not doing well or get worse.  Document Released: 12/07/2008 Document Revised: 05/05/2011 Document Reviewed: 09/30/2010  [employer] Patient Information 2014 [employer].

## 2013-06-21 NOTE — Telephone Encounter (Signed)
Pt's daughter called stating he is having his GI study today. He would like to get some fluids if possible.Per Dr. Consuello Masse we can give him fluids.  I asked her to come over to the cancer center when he is finished with his study. She voiced understanding.

## 2013-06-22 ENCOUNTER — Encounter: Payer: Self-pay | Admitting: Physician Assistant

## 2013-06-22 ENCOUNTER — Ambulatory Visit (HOSPITAL_BASED_OUTPATIENT_CLINIC_OR_DEPARTMENT_OTHER): Payer: BC Managed Care – PPO | Admitting: Physician Assistant

## 2013-06-22 ENCOUNTER — Telehealth: Payer: Self-pay | Admitting: Internal Medicine

## 2013-06-22 ENCOUNTER — Ambulatory Visit (HOSPITAL_BASED_OUTPATIENT_CLINIC_OR_DEPARTMENT_OTHER): Payer: BC Managed Care – PPO

## 2013-06-22 ENCOUNTER — Other Ambulatory Visit (HOSPITAL_BASED_OUTPATIENT_CLINIC_OR_DEPARTMENT_OTHER): Payer: BC Managed Care – PPO

## 2013-06-22 ENCOUNTER — Other Ambulatory Visit: Payer: Self-pay | Admitting: Internal Medicine

## 2013-06-22 VITALS — BP 114/79 | HR 84 | Temp 97.7°F | Resp 19 | Ht 67.0 in | Wt 108.2 lb

## 2013-06-22 DIAGNOSIS — K209 Esophagitis, unspecified without bleeding: Secondary | ICD-10-CM

## 2013-06-22 DIAGNOSIS — C3490 Malignant neoplasm of unspecified part of unspecified bronchus or lung: Secondary | ICD-10-CM

## 2013-06-22 DIAGNOSIS — R630 Anorexia: Secondary | ICD-10-CM

## 2013-06-22 DIAGNOSIS — I10 Essential (primary) hypertension: Secondary | ICD-10-CM

## 2013-06-22 DIAGNOSIS — C7A1 Malignant poorly differentiated neuroendocrine tumors: Secondary | ICD-10-CM

## 2013-06-22 DIAGNOSIS — Z5111 Encounter for antineoplastic chemotherapy: Secondary | ICD-10-CM

## 2013-06-22 DIAGNOSIS — K121 Other forms of stomatitis: Secondary | ICD-10-CM

## 2013-06-22 DIAGNOSIS — K1230 Oral mucositis (ulcerative), unspecified: Secondary | ICD-10-CM

## 2013-06-22 DIAGNOSIS — C7B8 Other secondary neuroendocrine tumors: Secondary | ICD-10-CM

## 2013-06-22 DIAGNOSIS — K2970 Gastritis, unspecified, without bleeding: Secondary | ICD-10-CM

## 2013-06-22 DIAGNOSIS — K2990 Gastroduodenitis, unspecified, without bleeding: Secondary | ICD-10-CM

## 2013-06-22 DIAGNOSIS — D649 Anemia, unspecified: Secondary | ICD-10-CM

## 2013-06-22 DIAGNOSIS — G893 Neoplasm related pain (acute) (chronic): Secondary | ICD-10-CM

## 2013-06-22 DIAGNOSIS — I2699 Other pulmonary embolism without acute cor pulmonale: Secondary | ICD-10-CM

## 2013-06-22 LAB — CBC WITH DIFFERENTIAL/PLATELET
BASO%: 1.3 % (ref 0.0–2.0)
BASOS ABS: 0.1 10*3/uL (ref 0.0–0.1)
EOS ABS: 0 10*3/uL (ref 0.0–0.5)
EOS%: 0.7 % (ref 0.0–7.0)
HEMATOCRIT: 31 % — AB (ref 38.4–49.9)
HEMOGLOBIN: 10 g/dL — AB (ref 13.0–17.1)
LYMPH%: 9.8 % — AB (ref 14.0–49.0)
MCH: 29.8 pg (ref 27.2–33.4)
MCHC: 32.2 g/dL (ref 32.0–36.0)
MCV: 92.5 fL (ref 79.3–98.0)
MONO#: 0.9 10*3/uL (ref 0.1–0.9)
MONO%: 13.3 % (ref 0.0–14.0)
NEUT#: 5.1 10*3/uL (ref 1.5–6.5)
NEUT%: 74.9 % (ref 39.0–75.0)
PLATELETS: 429 10*3/uL — AB (ref 140–400)
RBC: 3.35 10*6/uL — ABNORMAL LOW (ref 4.20–5.82)
RDW: 14.7 % — AB (ref 11.0–14.6)
WBC: 6.8 10*3/uL (ref 4.0–10.3)
lymph#: 0.7 10*3/uL — ABNORMAL LOW (ref 0.9–3.3)

## 2013-06-22 LAB — BASIC METABOLIC PANEL (CC13)
Anion Gap: 7 mEq/L (ref 3–11)
BUN: 5.3 mg/dL — AB (ref 7.0–26.0)
CALCIUM: 9.3 mg/dL (ref 8.4–10.4)
CO2: 28 mEq/L (ref 22–29)
Chloride: 102 mEq/L (ref 98–109)
Creatinine: 0.9 mg/dL (ref 0.7–1.3)
GLUCOSE: 95 mg/dL (ref 70–140)
Potassium: 4.4 mEq/L (ref 3.5–5.1)
Sodium: 137 mEq/L (ref 136–145)

## 2013-06-22 MED ORDER — ONDANSETRON 16 MG/50ML IVPB (CHCC)
INTRAVENOUS | Status: AC
  Filled 2013-06-22: qty 16

## 2013-06-22 MED ORDER — DEXAMETHASONE SODIUM PHOSPHATE 20 MG/5ML IJ SOLN
20.0000 mg | Freq: Once | INTRAMUSCULAR | Status: AC
  Administered 2013-06-22: 20 mg via INTRAVENOUS

## 2013-06-22 MED ORDER — ETOPOSIDE CHEMO INJECTION 1 GM/50ML
75.0000 mg/m2 | Freq: Once | INTRAVENOUS | Status: AC
  Administered 2013-06-22: 120 mg via INTRAVENOUS
  Filled 2013-06-22: qty 6

## 2013-06-22 MED ORDER — SODIUM CHLORIDE 0.9 % IV SOLN
Freq: Once | INTRAVENOUS | Status: AC
  Administered 2013-06-22: 15:00:00 via INTRAVENOUS

## 2013-06-22 MED ORDER — CARBOPLATIN CHEMO INJECTION 450 MG/45ML
257.4000 mg | Freq: Once | INTRAVENOUS | Status: AC
  Administered 2013-06-22: 260 mg via INTRAVENOUS
  Filled 2013-06-22: qty 26

## 2013-06-22 MED ORDER — DEXAMETHASONE SODIUM PHOSPHATE 20 MG/5ML IJ SOLN
INTRAMUSCULAR | Status: AC
  Filled 2013-06-22: qty 5

## 2013-06-22 MED ORDER — ONDANSETRON 16 MG/50ML IVPB (CHCC)
16.0000 mg | Freq: Once | INTRAVENOUS | Status: AC
  Administered 2013-06-22: 16 mg via INTRAVENOUS

## 2013-06-22 NOTE — Progress Notes (Signed)
[place] OFFICE PROGRESS NOTE  Ricky Villanueva, [place] [address]  DIAGNOSIS: Small cell carcinoma of lung - Plan: CBC with Differential, Comprehensive metabolic panel (Cmet) - [facility]  No chief complaint on file.   CURRENT TREATMENT: WB XRT started on 03/10 - 03/25 per Dr. Pablo Ledger.  Started cisplatin plus etoposide on 03/25 as detailed below.   INTERVAL HISTORY: Ricky Villanueva 63 y.o. 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  He was last seen by Dr. Juliann Mule on 06/13/2013.   His past medical history also includes HTN/Hyperlipidemia and longstanding smoking history but quit 12/25/2011 presented to ER on 04/21/13 with worsening dyspnea, cough with streaks of blood over the past one week. In the ED, his oxygen saturation was 91% on 2 L nasal canula. CXR revealed a progressive increased density in the right lower lobe consistent with worsening atelectasis or pneumonia and a soft tissue fullness in the hilar region suspicious for lymphadenopathy or mass.and follow-up CT of chest was recommended. It revealed a nonocclusive left upper lobe, right lower lobe pulmonary emboli with right heart strain. Bulky mediastinal and right hilar lymphadenopathy, encasing the of pulmonary arteries, pulmonary veins and bronchi, completely effacing right lower lobe segmental bronchi. Extensive consolidation in the right lung suggested postobstructive pneumonia, with interstitial prominence which may reflect superimposed lymphangitic spread of infection or neoplasm. He was admitted and started on antibiotics and heparin. Pulmonary was consulted and he had a video bronchoscopy on 03/03 with consistent with small cell ca of lung. 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 He had  WBXRT as noted above.   He then received his first cycle of chemotherapy on 03/25 thru 03/27 and was admitted 04/01 and discharged on 05/31/13 due to dehydration, acute renal failure, mucositis and subsequently had febrile neutropenia.  We restarted the Xarelto which held  due to profound thrombocytopenia.  Today, patient is accompanied by his wife [name], daughter. He was evaluated radiology in there is an indication for hearing aid. The patient's family took him to the referred hearing in cardiology services to get a hearing aid however were told that it would be best to wait until he finished his chemotherapy. The patient is no longer being treated with cisplatin and there's no need to wait to positively augment his hearing. His primary complaint continues to be that of burning in the mid epigastric area with eating or swallowing liquids. He reports that he had a colonoscopy and endoscopy approximately 3 years ago. He was told at that time that he had "2 small lesions" in his lower esophagus. He doesn't recall any other treatment plan other than the fact that there is just small lesions present at that time. He was evaluated by a gastroenterologist, Dr. Henrene Pastor who ordered in upper GI on 06/22/2013 that was read as normal. There were no areas of stricture in no apparent masses.  He is scheduled to see audiology on 04/23. He eating sparingly and feels the esophagus burns following his meals.  He continues to drink ensure and boost as much as he can tolerate. He is on protonix 40 mg daily.  Zantac during the day.  No fevers or chills or he tolerates oxygen 2 liters by nasal cannula.    MEDICAL  HISTORY: Past Medical History  Diagnosis Date  . Hyperlipidemia   . Hypertension   . GERD (gastroesophageal reflux disease)   . Respiratory failure with hypoxia 04/21/2013    secondary to pneumonia/notes 04/21/2013  . Pulmonary embolism     "got one in there now" (04/21/2013)  . COPD (chronic obstructive pulmonary  disease)   . Pneumonia 2/?01/2014    "wouldn't get better; hospitalized 04/21/2013)  . Arthritis     "minor in my right hand" (04/21/2013)  . Cancer     lung ca dx'd 03/2013  . History of radiation therapy 05/03/13-05/18/13    chest & whole brain    INTERIM HISTORY: has GERD (gastroesophageal reflux disease); HCAP (healthcare-associated pneumonia); Respiratory failure with hypoxia; HTN (hypertension); Acute pulmonary embolism; Lung mass; Emphysema lung; Small cell carcinoma of lung; Acute renal failure; Protein-calorie malnutrition, severe; and Anorexia on his problem list.    ALLERGIES:  is allergic to augmentin.  MEDICATIONS: has a current medication list which includes the following prescription(s): albuterol, dexamethasone, dss, dronabinol, emollient, morphine, ondansetron, oxycodone, pantoprazole, prochlorperazine, ranitidine, rivaroxaban, tiotropium, and benecalorie, and the following Facility-Administered Medications: etoposide (VEPESID) 120 mg in sodium chloride 0.9 % 500 mL chemo infusion.  SURGICAL HISTORY:  Past Surgical History  Procedure Laterality Date  . Inguinal hernia repair Right ~ 2005  . Finger surgery Left ~ 1989    "crushed end of my middle finger off"   . Video bronchoscopy Bilateral 04/26/2013    Procedure: VIDEO BRONCHOSCOPY WITH FLUORO;  Surgeon: Wilhelmina Mcardle, MD;  Location: [facility] END[facility];  Service: Cardiopulmonary;  Laterality: Bilateral;    REVIEW OF SYSTEMS:   Constitutional: Denies fevers, chills or abnormal weight loss Eyes: Denies blurriness of vision Ears, nose, mouth, throat, and face: Denies mucositis or sore throat Respiratory: He reports cough, dyspnea  But denies wheezes Cardiovascular: Denies palpitation, chest discomfort or lower extremity swelling Gastrointestinal:  Denies nausea, does report heartburn/ epigastric burning with eating or drinking. No change in bowel habits Skin: Denies abnormal skin rashes Lymphatics: Denies new lymphadenopathy or  easy bruising Neurological:Denies numbness, tingling or new weaknesses Behavioral/Psych: Mood is stable, no new changes  All other systems were reviewed with the patient and are negative.  PHYSICAL EXAMINATION: ECOG PERFORMANCE STATUS: 1 - Symptomatic but completely ambulatory  Blood pressure 114/79, pulse 84, temperature 97.7 F (36.5 C), temperature source Oral, resp. rate 19, height 5\' 7"  (1.702 m), weight 108 lb 3.2 oz (49.079 kg).  GENERAL:alert, no distress and comfortable, thin chronically ill appearing gentleman on 2 Liters of oxygen via nasal cannula.  SKIN: skin color, texture, turgor are normal, no rashes or significant lesions EYES: normal, Conjunctiva are pink and non-injected, sclera clear OROPHARYNX:no exudate, no erythema and lips, buccal mucosa, and tongue normal  NECK: supple, thyroid normal size, non-tender, without nodularity LYMPH:  no palpable lymphadenopathy in the cervical, axillary or supraclavicular LUNGS: clear to auscultation with normal breathing effort, no wheezes or rhonchi on the left. R lung with decreased breath sound at the bases.  HEART: regular rate & rhythm and no murmurs and no lower extremity edema ABDOMEN:abdomen soft, non-tender and normal bowel sounds Musculoskeletal:no cyanosis of digits and no clubbing  NEURO: alert & oriented x 3 with fluent speech, no focal motor/sensory deficits  Labs:  Lab Results  Component Value Date   WBC 6.8 06/22/2013   HGB 10.0* 06/22/2013   HCT 31.0* 06/22/2013   MCV 92.5 06/22/2013   PLT 429* 06/22/2013   NEUTROABS 5.1 06/22/2013  Chemistry      Component Value Date/Time   NA 137 06/22/2013 1321   NA 138 05/31/2013 0345   K 4.4 06/22/2013 1321   K 3.7 05/31/2013 0345   CL 103 05/31/2013 0345   CO2 28 06/22/2013 1321   CO2 26 05/31/2013 0345   BUN 5.3* 06/22/2013 1321   BUN 8 05/31/2013 0345   CREATININE 0.9 06/22/2013 1321   CREATININE 0.83 05/31/2013 0345      Component Value Date/Time   CALCIUM 9.3 06/22/2013  1321   CALCIUM 8.0* 05/31/2013 0345   ALKPHOS 84 06/13/2013 1516   ALKPHOS 70 05/26/2013 0405   AST 30 06/13/2013 1516   AST 12 05/26/2013 0405   ALT 14 06/13/2013 1516   ALT 12 05/26/2013 0405   BILITOT 0.40 06/13/2013 1516   BILITOT 0.5 05/26/2013 0405       Basic Metabolic Panel:  Recent Labs Lab 06/22/13 1321  NA 137  K 4.4  CO2 28  GLUCOSE 95  BUN 5.3*  CREATININE 0.9  CALCIUM 9.3   GFR Estimated Creatinine Clearance: 59.9 ml/min (by C-G formula based on Cr of 0.9). Liver Function Tests:  CBC:  Recent Labs Lab 06/22/13 1320  WBC 6.8  NEUTROABS 5.1  HGB 10.0*  HCT 31.0*  MCV 92.5  PLT 429*    Studies:  Dg Ugi W/high Density W/kub  06/21/2013   CLINICAL DATA:  History of small cell lung cancer. Epigastric pain and loss of appetite  EXAM: UPPER GI SERIES WITH KUB  TECHNIQUE: After obtaining a scout radiograph a routine upper GI series was performed using thin and high density barium.  FLUOROSCOPY TIME:  1 min 35 seconds  COMPARISON:  None.  FINDINGS: The esophagus is patent. There is no stricture or mass identified. The stomach, duodenal bulb and sweep have a normal configuration and appearance. The patient ingested a 13 mm barium tablet which promptly passed through the esophagus and into the stomach. The motility of the esophagus appears normal. No reflux identified.  IMPRESSION: Normal exam.   Electronically Signed   By: Kerby Moors M.D.   On: 06/21/2013 17:48     RADIOGRAPHIC STUDIES: None  PATHOLOGY: Diagnosis 04/26/2013 Bronchus, biopsy, RLL - POORLY DIFFERENTIATED SMALL CELL NEUROENDOCRINE CARCINOMA. Microscopic Comment The biopsies are extensively involved by an atypical infiltrate with histologic features of small cell carcinoma. Immunohistochemistry is performed and the tumor is positive for CD56, Chromogranin, Synaptophysin, Thyroid Transcription Factor - 1, Cytokeratin 7 and Cytokeratin AE1 / AE3. Tumor is negative for Cytokeratin 56 and mostly negative with  p63. The histologic and immunophenotypic characteristics are consistent with poorly differentiated small cell neuroendocrine carcinoma. Case discussed with Dr. Alva Garnet on 04/27/13. (JDP:caf 04/28/13) Claudette Laws MD Pathologist, Electronic Signature (Case signed 04/28/2013) Specimen Gross and Clinical Information Specimen(s) Obtained: Bronchus, biopsy, RLL  ASSESSMENT: Althea Charon 63 y.o. male with a history of Small cell carcinoma of lung - Plan: CBC with Differential, Comprehensive metabolic panel (Cmet) - [facility]  63 yo male with newly diagnosed small cell carcinoma of the R lung complicated by obstructive pneumonia, PE. ECOG 1. + weight loss.   PLAN:  1. EXTENSIVE STAGE IV SMALL CELL CARCINOMA OF THE R LUNG WITH +BRAIN METASTASES --Dr. Pablo Ledger completed whole brain XRT on 03/25.  We started chemotherapy on 03/25 and he completed  Cycle #1  On cisplatin 80 mg/m2 on day 1, etoposide 100 mg/m2 days 1,2,3  And for a maximum of 4-6 cycles every 21 days.   He  awaits for audiology appointment.  Given his recent hospitalization as detailed above and now complains of hearing fullness and intermittent tinnitus, we  discontinued cisplatin and substitute with carboplatin AUC 5.  In addition, we will decrease etoposide dose by 25% due to profound neutropenia. We will also continue neulasta of Day#4 of his chemotherapy each cycle due to febrile neutropenia. Plan start day for his cycle #2, day #1 is on 06/22/2013. Patient was reviewed with Dr. Juliann Mule. We'll actually further dose reduce his chemotherapy to carboplatin for an AUC of 3 given on day 1 and etoposide at 80 mg per meter square given on days 1, 2 and 3 with Neulasta support on day 4. As this gentleman is quite frail about the same in one week for a symptom management visit after this second cycle of chemotherapy.   -- CT of abdomen and pelvis and chest performed on 06/15/2013 did show positive response to therapy. There is no evidence of new or metastatic  within the abdomen or pelvis.   2. Gastritis/esophagitis. --Increased protonix to 40 mg bid (up from daily) and continue zantac doing day. As stated above the patient was evaluated by gastroenterology, having an upper GI performed 06/22/2013 to further evaluate her symptoms. This study was read as normal. The patient and his family were Ms. Tommie they are not scheduled for a followup appointment back to the gastroenterologist. I wonder if the patient would get any increased relief by changing the Protonix 40 mg twice a day to Zegerid 40 mg twice a day. The addition of the bicarbonate  In the Zegerid may provide him some more symptomatic relief. This may be something to consider.    3. Anorexia. --Likely secondary to #1.  He is to continue on Marinol as prescribed.   4. Pain associated with #1, improved. --Patient reported insomnia secondary to pain with deep breathing.  We provided him oxycodone 5 mg by mouth every 4 hours as need for severe pain. He reports improvement and now continues to use these as needed.   5. PE (04/21/2013). --CTA of chest noted above consistent with Nonocclusive left upper lobe, right lower lobe pulmonary emboli. Right heart strain.  Continue  xarelto 20 mg daily.   6. Anemia secondary to #1. --Stable, we will continue to monitor  7. Hypertension. --He will continue lisinopril 10 mg daily as tolerated.  8. Mucositis secondary #1.  --Carafate as tolerated.    9. Emphysema --Continue spiriva hanihaler 18 mcg inhalation daily. Oxygen 2 liters by [place]  10. Follow-up.  --Patient will follow up in 1 week for a symptom management visit after completing cycle #2 of his systemic chemotherapy now with reduced dose carboplatin and etoposide with Neulasta support.   11. Decreased hearing . In order for his hearing aid (s) will be sent to the pain hearing and not etiology services fax number [phone], phone number [phone]. He is no longer being treated with  cisplatin. He may benefit from a quality of life standpoint firm improved hearing as augmented by the hearing aid (s).  All questions were answered. The patient knows to call the clinic with any problems, questions or concerns. We can certainly see the patient much sooner if necessary.  I spent 25 minutes counseling the patient face to face. The total time spent in the appointment was 35 minutes.    Carlton Adam, PA-C 06/22/2013 4:11 PM

## 2013-06-22 NOTE — Patient Instructions (Signed)
[place] [facility] Discharge Instructions for Patients Receiving Chemotherapy  Today you received the following chemotherapy agents Carboplatin/VP 16 (Etoposide) To help prevent nausea and vomiting after your treatment, we encourage you to take your nausea medication as prescribed.  If you develop nausea and vomiting that is not controlled by your nausea medication, call the clinic.   BELOW ARE SYMPTOMS THAT SHOULD BE REPORTED IMMEDIATELY:  *FEVER GREATER THAN 100.5 F  *CHILLS WITH OR WITHOUT FEVER  NAUSEA AND VOMITING THAT IS NOT CONTROLLED WITH YOUR NAUSEA MEDICATION  *UNUSUAL SHORTNESS OF BREATH  *UNUSUAL BRUISING OR BLEEDING  TENDERNESS IN MOUTH AND THROAT WITH OR WITHOUT PRESENCE OF ULCERS  *URINARY PROBLEMS  *BOWEL PROBLEMS  UNUSUAL RASH Items with * indicate a potential emergency and should be followed up as soon as possible.  Feel free to call the clinic you have any questions or concerns. The clinic phone number is [phone].     Carboplatin injection What is this medicine? CARBOPLATIN (KAR boe pla tin) is a chemotherapy drug. It targets fast dividing cells, like cancer cells, and causes these cells to die. This medicine is used to treat ovarian cancer and many other cancers. This medicine may be used for other purposes; ask your health care provider or pharmacist if you have questions. COMMON BRAND NAME(S): Paraplatin What should I tell my health care provider before I take this medicine? They need to know if you have any of these conditions: -blood disorders -hearing problems -kidney disease -recent or ongoing radiation therapy -an unusual or allergic reaction to carboplatin, cisplatin, other chemotherapy, other medicines, foods, dyes, or preservatives -pregnant or trying to get pregnant -breast-feeding How should I use this medicine? This drug is usually given as an infusion into a vein. It is administered in a hospital or clinic by a specially  trained health care professional. Talk to your pediatrician regarding the use of this medicine in children. Special care may be needed. Overdosage: If you think you have taken too much of this medicine contact a poison control center or emergency room at once. NOTE: This medicine is only for you. Do not share this medicine with others. What if I miss a dose? It is important not to miss a dose. Call your doctor or health care professional if you are unable to keep an appointment. What may interact with this medicine? -medicines for seizures -medicines to increase blood counts like filgrastim, pegfilgrastim, sargramostim -some antibiotics like amikacin, gentamicin, neomycin, streptomycin, tobramycin -vaccines Talk to your doctor or health care professional before taking any of these medicines: -acetaminophen -aspirin -ibuprofen -ketoprofen -naproxen This list may not describe all possible interactions. Give your health care provider a list of all the medicines, herbs, non-prescription drugs, or dietary supplements you use. Also tell them if you smoke, drink alcohol, or use illegal drugs. Some items may interact with your medicine. What should I watch for while using this medicine? Your condition will be monitored carefully while you are receiving this medicine. You will need important blood work done while you are taking this medicine. This drug may make you feel generally unwell. This is not uncommon, as chemotherapy can affect healthy cells as well as cancer cells. Report any side effects. Continue your course of treatment even though you feel ill unless your doctor tells you to stop. In some cases, you may be given additional medicines to help with side effects. Follow all directions for their use. Call your doctor or health care professional for advice if  you get a fever, chills or sore throat, or other symptoms of a cold or flu. Do not treat yourself. This drug decreases your body's ability  to fight infections. Try to avoid being around people who are sick. This medicine may increase your risk to bruise or bleed. Call your doctor or health care professional if you notice any unusual bleeding. Be careful brushing and flossing your teeth or using a toothpick because you may get an infection or bleed more easily. If you have any dental work done, tell your dentist you are receiving this medicine. Avoid taking products that contain aspirin, acetaminophen, ibuprofen, naproxen, or ketoprofen unless instructed by your doctor. These medicines may hide a fever. Do not become pregnant while taking this medicine. Women should inform their doctor if they wish to become pregnant or think they might be pregnant. There is a potential for serious side effects to an unborn child. Talk to your health care professional or pharmacist for more information. Do not breast-feed an infant while taking this medicine. What side effects may I notice from receiving this medicine? Side effects that you should report to your doctor or health care professional as soon as possible: -allergic reactions like skin rash, itching or hives, swelling of the face, lips, or tongue -signs of infection - fever or chills, cough, sore throat, pain or difficulty passing urine -signs of decreased platelets or bleeding - bruising, pinpoint red spots on the skin, black, tarry stools, nosebleeds -signs of decreased red blood cells - unusually weak or tired, fainting spells, lightheadedness -breathing problems -changes in hearing -changes in vision -chest pain -high blood pressure -low blood counts - This drug may decrease the number of white blood cells, red blood cells and platelets. You may be at increased risk for infections and bleeding. -nausea and vomiting -pain, swelling, redness or irritation at the injection site -pain, tingling, numbness in the hands or feet -problems with balance, talking, walking -trouble passing urine  or change in the amount of urine Side effects that usually do not require medical attention (report to your doctor or health care professional if they continue or are bothersome): -hair loss -loss of appetite -metallic taste in the mouth or changes in taste This list may not describe all possible side effects. Call your doctor for medical advice about side effects. You may report side effects to FDA at 1-800-FDA-1088. Where should I keep my medicine? This drug is given in a hospital or clinic and will not be stored at home. NOTE: This sheet is a summary. It may not cover all possible information. If you have questions about this medicine, talk to your doctor, pharmacist, or health care provider.  2014, [employer] Standard. (2007-05-18 14:38:05)

## 2013-06-22 NOTE — Telephone Encounter (Signed)
gv adn printed appt sched and avs for pt for April adn May....sed added tx.

## 2013-06-23 ENCOUNTER — Encounter: Payer: BC Managed Care – PPO | Admitting: Nutrition

## 2013-06-23 ENCOUNTER — Ambulatory Visit (HOSPITAL_BASED_OUTPATIENT_CLINIC_OR_DEPARTMENT_OTHER): Payer: BC Managed Care – PPO

## 2013-06-23 ENCOUNTER — Encounter: Payer: Self-pay | Admitting: Medical Oncology

## 2013-06-23 ENCOUNTER — Other Ambulatory Visit: Payer: Self-pay | Admitting: Internal Medicine

## 2013-06-23 ENCOUNTER — Ambulatory Visit: Payer: BC Managed Care – PPO

## 2013-06-23 ENCOUNTER — Ambulatory Visit
Admission: RE | Admit: 2013-06-23 | Payer: BC Managed Care – PPO | Source: Ambulatory Visit | Admitting: Radiation Oncology

## 2013-06-23 VITALS — BP 110/73 | HR 85 | Temp 97.3°F

## 2013-06-23 DIAGNOSIS — C7B8 Other secondary neuroendocrine tumors: Secondary | ICD-10-CM

## 2013-06-23 DIAGNOSIS — C3490 Malignant neoplasm of unspecified part of unspecified bronchus or lung: Secondary | ICD-10-CM

## 2013-06-23 DIAGNOSIS — C7A1 Malignant poorly differentiated neuroendocrine tumors: Secondary | ICD-10-CM

## 2013-06-23 DIAGNOSIS — Z5111 Encounter for antineoplastic chemotherapy: Secondary | ICD-10-CM

## 2013-06-23 HISTORY — DX: Personal history of irradiation: Z92.3

## 2013-06-23 MED ORDER — SODIUM CHLORIDE 0.9 % IV SOLN
Freq: Once | INTRAVENOUS | Status: AC
  Administered 2013-06-23: 14:00:00 via INTRAVENOUS

## 2013-06-23 MED ORDER — PROCHLORPERAZINE MALEATE 10 MG PO TABS
ORAL_TABLET | ORAL | Status: AC
  Filled 2013-06-23: qty 1

## 2013-06-23 MED ORDER — SODIUM CHLORIDE 0.9 % IV SOLN
75.0000 mg/m2 | Freq: Once | INTRAVENOUS | Status: AC
  Administered 2013-06-23: 120 mg via INTRAVENOUS
  Filled 2013-06-23: qty 6

## 2013-06-23 MED ORDER — PROCHLORPERAZINE MALEATE 10 MG PO TABS
10.0000 mg | ORAL_TABLET | Freq: Once | ORAL | Status: AC
  Administered 2013-06-23: 10 mg via ORAL

## 2013-06-23 NOTE — Patient Instructions (Signed)
Continue with labs and chemotherapy as scheduled Followup in one week for a symptom management visit

## 2013-06-23 NOTE — Patient Instructions (Signed)
[facility] Discharge Instructions for Patients Receiving Chemotherapy  Today you received the following chemotherapy agents VP 16 (Etoposide) To help prevent nausea and vomiting after your treatment, we encourage you to take your nausea medication as prescribed.  If you develop nausea and vomiting that is not controlled by your nausea medication, call the clinic.   BELOW ARE SYMPTOMS THAT SHOULD BE REPORTED IMMEDIATELY:  *FEVER GREATER THAN 100.5 F  *CHILLS WITH OR WITHOUT FEVER  NAUSEA AND VOMITING THAT IS NOT CONTROLLED WITH YOUR NAUSEA MEDICATION  *UNUSUAL SHORTNESS OF BREATH  *UNUSUAL BRUISING OR BLEEDING  TENDERNESS IN MOUTH AND THROAT WITH OR WITHOUT PRESENCE OF ULCERS  *URINARY PROBLEMS  *BOWEL PROBLEMS  UNUSUAL RASH Items with * indicate a potential emergency and should be followed up as soon as possible.  Feel free to call the clinic you have any questions or concerns. The clinic phone number is [phone].

## 2013-06-23 NOTE — Progress Notes (Signed)
Faxed prescription for hearing aids to [facility].

## 2013-06-24 ENCOUNTER — Ambulatory Visit (HOSPITAL_BASED_OUTPATIENT_CLINIC_OR_DEPARTMENT_OTHER): Payer: BC Managed Care – PPO

## 2013-06-24 ENCOUNTER — Telehealth: Payer: Self-pay | Admitting: Family Medicine

## 2013-06-24 ENCOUNTER — Other Ambulatory Visit: Payer: Self-pay

## 2013-06-24 ENCOUNTER — Telehealth: Payer: Self-pay | Admitting: Internal Medicine

## 2013-06-24 VITALS — BP 101/89 | HR 101 | Temp 97.6°F | Resp 19

## 2013-06-24 DIAGNOSIS — C7B8 Other secondary neuroendocrine tumors: Secondary | ICD-10-CM

## 2013-06-24 DIAGNOSIS — C3490 Malignant neoplasm of unspecified part of unspecified bronchus or lung: Secondary | ICD-10-CM

## 2013-06-24 DIAGNOSIS — Z5111 Encounter for antineoplastic chemotherapy: Secondary | ICD-10-CM

## 2013-06-24 DIAGNOSIS — C7A1 Malignant poorly differentiated neuroendocrine tumors: Secondary | ICD-10-CM

## 2013-06-24 DIAGNOSIS — K219 Gastro-esophageal reflux disease without esophagitis: Secondary | ICD-10-CM

## 2013-06-24 DIAGNOSIS — K1230 Oral mucositis (ulcerative), unspecified: Secondary | ICD-10-CM

## 2013-06-24 MED ORDER — LORAZEPAM 0.5 MG PO TABS: 0.5000 mg | ORAL_TABLET | Freq: Three times a day (TID) | ORAL | Status: DC

## 2013-06-24 MED ORDER — ETOPOSIDE CHEMO INJECTION 1 GM/50ML
75.0000 mg/m2 | Freq: Once | INTRAVENOUS | Status: AC
  Administered 2013-06-24: 120 mg via INTRAVENOUS
  Filled 2013-06-24: qty 6

## 2013-06-24 MED ORDER — PANTOPRAZOLE SODIUM 40 MG PO TBEC: 40.0000 mg | DELAYED_RELEASE_TABLET | Freq: Every day | ORAL | Status: DC

## 2013-06-24 MED ORDER — PROCHLORPERAZINE MALEATE 10 MG PO TABS
ORAL_TABLET | ORAL | Status: AC
  Filled 2013-06-24: qty 1

## 2013-06-24 MED ORDER — MORPHINE SULFATE ER 30 MG PO TBCR: 30.0000 mg | EXTENDED_RELEASE_TABLET | Freq: Two times a day (BID) | ORAL | Status: DC

## 2013-06-24 MED ORDER — SODIUM CHLORIDE 0.9 % IV SOLN
Freq: Once | INTRAVENOUS | Status: AC
  Administered 2013-06-24: 10:00:00 via INTRAVENOUS

## 2013-06-24 MED ORDER — RANITIDINE HCL 300 MG PO CAPS: 300.0000 mg | ORAL_CAPSULE | Freq: Every day | ORAL | Status: DC

## 2013-06-24 MED ORDER — PROCHLORPERAZINE MALEATE 10 MG PO TABS
10.0000 mg | ORAL_TABLET | Freq: Once | ORAL | Status: AC
  Administered 2013-06-24: 10 mg via ORAL

## 2013-06-24 NOTE — Telephone Encounter (Signed)
done

## 2013-06-24 NOTE — Telephone Encounter (Signed)
See result note.  

## 2013-06-24 NOTE — Patient Instructions (Signed)
[facility] Discharge Instructions for Patients Receiving Chemotherapy  Today you received the following chemotherapy agents: Etoposide.  To help prevent nausea and vomiting after your treatment, we encourage you to take your nausea medication: Compazine 10 mg every 6 hrs as needed for nausea.    If you develop nausea and vomiting that is not controlled by your nausea medication, call the clinic.   BELOW ARE SYMPTOMS THAT SHOULD BE REPORTED IMMEDIATELY:  *FEVER GREATER THAN 100.5 F  *CHILLS WITH OR WITHOUT FEVER  NAUSEA AND VOMITING THAT IS NOT CONTROLLED WITH YOUR NAUSEA MEDICATION  *UNUSUAL SHORTNESS OF BREATH  *UNUSUAL BRUISING OR BLEEDING  TENDERNESS IN MOUTH AND THROAT WITH OR WITHOUT PRESENCE OF ULCERS  *URINARY PROBLEMS  *BOWEL PROBLEMS  UNUSUAL RASH Items with * indicate a potential emergency and should be followed up as soon as possible.  Feel free to call the clinic you have any questions or concerns. The clinic phone number is [phone].

## 2013-06-25 ENCOUNTER — Ambulatory Visit (HOSPITAL_BASED_OUTPATIENT_CLINIC_OR_DEPARTMENT_OTHER): Payer: BC Managed Care – PPO

## 2013-06-25 VITALS — BP 101/75 | HR 81 | Temp 96.9°F | Resp 20

## 2013-06-25 DIAGNOSIS — C7B8 Other secondary neuroendocrine tumors: Secondary | ICD-10-CM

## 2013-06-25 DIAGNOSIS — Z5189 Encounter for other specified aftercare: Secondary | ICD-10-CM

## 2013-06-25 DIAGNOSIS — C7A1 Malignant poorly differentiated neuroendocrine tumors: Secondary | ICD-10-CM

## 2013-06-25 DIAGNOSIS — C3490 Malignant neoplasm of unspecified part of unspecified bronchus or lung: Secondary | ICD-10-CM

## 2013-06-25 MED ORDER — PEGFILGRASTIM INJECTION 6 MG/0.6ML
6.0000 mg | Freq: Once | SUBCUTANEOUS | Status: AC
  Administered 2013-06-25: 6 mg via SUBCUTANEOUS

## 2013-06-25 NOTE — Patient Instructions (Signed)

## 2013-06-27 ENCOUNTER — Telehealth: Payer: Self-pay | Admitting: *Deleted

## 2013-06-27 ENCOUNTER — Telehealth: Payer: Self-pay | Admitting: Internal Medicine

## 2013-06-27 NOTE — Telephone Encounter (Signed)
Message copied by Cherylynn Ridges on Mon Jun 27, 2013  5:03 PM ------      Message from: Perlie Gold      Created: Wed Jun 22, 2013  4:00 PM      Regarding: Chemo follow up call      Contact: [phone]       First time Carboplatin/VP 16. Dr. Juliann Mule patient. Please call.            Thanks,      Barnetta Chapel, RN ------

## 2013-06-27 NOTE — Telephone Encounter (Signed)
Called Ricky Villanueva at [phone] number(s).  Message left requesting a return call for chemotherapy follow up.  Awaiting return call from patient.

## 2013-06-27 NOTE — Telephone Encounter (Signed)
, °

## 2013-06-29 ENCOUNTER — Other Ambulatory Visit (HOSPITAL_BASED_OUTPATIENT_CLINIC_OR_DEPARTMENT_OTHER): Payer: BC Managed Care – PPO

## 2013-06-29 ENCOUNTER — Ambulatory Visit (HOSPITAL_BASED_OUTPATIENT_CLINIC_OR_DEPARTMENT_OTHER): Payer: BC Managed Care – PPO | Admitting: Physician Assistant

## 2013-06-29 ENCOUNTER — Telehealth: Payer: Self-pay | Admitting: Internal Medicine

## 2013-06-29 ENCOUNTER — Encounter: Payer: Self-pay | Admitting: Physician Assistant

## 2013-06-29 VITALS — BP 99/69 | HR 109 | Temp 97.7°F | Resp 16 | Ht 67.0 in | Wt 102.5 lb

## 2013-06-29 DIAGNOSIS — C7931 Secondary malignant neoplasm of brain: Secondary | ICD-10-CM

## 2013-06-29 DIAGNOSIS — K1230 Oral mucositis (ulcerative), unspecified: Secondary | ICD-10-CM

## 2013-06-29 DIAGNOSIS — K209 Esophagitis, unspecified without bleeding: Secondary | ICD-10-CM

## 2013-06-29 DIAGNOSIS — C7949 Secondary malignant neoplasm of other parts of nervous system: Secondary | ICD-10-CM

## 2013-06-29 DIAGNOSIS — I2699 Other pulmonary embolism without acute cor pulmonale: Secondary | ICD-10-CM

## 2013-06-29 DIAGNOSIS — G893 Neoplasm related pain (acute) (chronic): Secondary | ICD-10-CM

## 2013-06-29 DIAGNOSIS — K121 Other forms of stomatitis: Secondary | ICD-10-CM

## 2013-06-29 DIAGNOSIS — R630 Anorexia: Secondary | ICD-10-CM

## 2013-06-29 DIAGNOSIS — C3490 Malignant neoplasm of unspecified part of unspecified bronchus or lung: Secondary | ICD-10-CM

## 2013-06-29 DIAGNOSIS — B370 Candidal stomatitis: Secondary | ICD-10-CM

## 2013-06-29 LAB — CBC WITH DIFFERENTIAL/PLATELET
BASO%: 0.1 % (ref 0.0–2.0)
Basophils Absolute: 0 10*3/uL (ref 0.0–0.1)
EOS%: 0.9 % (ref 0.0–7.0)
Eosinophils Absolute: 0.1 10*3/uL (ref 0.0–0.5)
HCT: 29.8 % — ABNORMAL LOW (ref 38.4–49.9)
HGB: 9.5 g/dL — ABNORMAL LOW (ref 13.0–17.1)
LYMPH%: 4.1 % — ABNORMAL LOW (ref 14.0–49.0)
MCH: 29.5 pg (ref 27.2–33.4)
MCHC: 32 g/dL (ref 32.0–36.0)
MCV: 92.2 fL (ref 79.3–98.0)
MONO#: 0.3 10*3/uL (ref 0.1–0.9)
MONO%: 1.8 % (ref 0.0–14.0)
NEUT#: 14.7 10*3/uL — ABNORMAL HIGH (ref 1.5–6.5)
NEUT%: 93.1 % — AB (ref 39.0–75.0)
Platelets: 177 10*3/uL (ref 140–400)
RBC: 3.23 10*6/uL — ABNORMAL LOW (ref 4.20–5.82)
RDW: 15.3 % — ABNORMAL HIGH (ref 11.0–14.6)
WBC: 15.8 10*3/uL — AB (ref 4.0–10.3)
lymph#: 0.7 10*3/uL — ABNORMAL LOW (ref 0.9–3.3)

## 2013-06-29 LAB — COMPREHENSIVE METABOLIC PANEL (CC13)
ALT: 13 U/L (ref 0–55)
ANION GAP: 8 meq/L (ref 3–11)
AST: 13 U/L (ref 5–34)
Albumin: 3.2 g/dL — ABNORMAL LOW (ref 3.5–5.0)
Alkaline Phosphatase: 100 U/L (ref 40–150)
BILIRUBIN TOTAL: 1.01 mg/dL (ref 0.20–1.20)
BUN: 22.1 mg/dL (ref 7.0–26.0)
CALCIUM: 9.7 mg/dL (ref 8.4–10.4)
CHLORIDE: 101 meq/L (ref 98–109)
CO2: 29 mEq/L (ref 22–29)
CREATININE: 0.9 mg/dL (ref 0.7–1.3)
Glucose: 120 mg/dl (ref 70–140)
Potassium: 4.4 mEq/L (ref 3.5–5.1)
SODIUM: 138 meq/L (ref 136–145)
TOTAL PROTEIN: 6.2 g/dL — AB (ref 6.4–8.3)

## 2013-06-29 MED ORDER — NYSTATIN 100000 UNIT/ML MT SUSP: OROMUCOSAL | Status: DC

## 2013-06-29 NOTE — Patient Instructions (Addendum)
Hold your Protonix and Zantac. Take over-the-counter Zegerid twice daily as instructed Followup in 2 weeks

## 2013-06-29 NOTE — Progress Notes (Signed)
[facility] OFFICE PROGRESS NOTE  [name], [place] [address]  DIAGNOSIS: Small cell carcinoma of lung - Plan: CBC with Differential, Comprehensive metabolic panel (Cmet) - [facility], nystatin (MYCOSTATIN) 100000 UNIT/ML suspension  No chief complaint on file.   CURRENT TREATMENT: WB XRT started on 03/10 - 03/25 per Dr. Pablo Ledger.  Started cisplatin plus etoposide on 03/25 as detailed below.   INTERVAL HISTORY: Ricky Villanueva 63 y.o. 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  He was last seen by me on 06/22/2013.   His past medical history also includes HTN/Hyperlipidemia and longstanding smoking history but quit 12/25/2011 presented to ER on 04/21/13 with worsening dyspnea, cough with streaks of blood over the past one week. In the ED, his oxygen saturation was 91% on 2 L nasal canula. CXR revealed a progressive increased density in the right lower lobe consistent with worsening atelectasis or pneumonia and a soft tissue fullness in the hilar region suspicious for lymphadenopathy or mass.and follow-up CT of chest was recommended. It revealed a nonocclusive left upper lobe, right lower lobe pulmonary emboli with right heart strain. Bulky mediastinal and right hilar lymphadenopathy, encasing the of pulmonary arteries, pulmonary veins and bronchi, completely effacing right lower lobe segmental bronchi. Extensive consolidation in the right lung suggested postobstructive pneumonia, with interstitial prominence which may reflect superimposed lymphangitic spread of infection or neoplasm. He was admitted and started on antibiotics and heparin. Pulmonary was consulted and he had a video bronchoscopy on 03/03 with consistent with small cell ca of lung. On the day of discharge on 03/06, he had an MRI of the brain revealing 1.5 cm ring-enhancing lesion in the midbrain and 1-2 punctate  lesions in the right parietal lobe. He had WBXRT as noted above.   He then received his first cycle of chemotherapy on 03/25 thru 03/27 and was admitted 04/01 and discharged on 05/31/13 due to dehydration, acute renal failure, mucositis and subsequently had febrile neutropenia.  We restarted the Xarelto which held  due to profound thrombocytopenia.  Today, patient is accompanied by his wife [name], and son. He was evaluated radiology in there is an indication for hearing aid. The patient's family took him to the referred hearing in cardiology services to get a hearing aid however were told that it would be best to wait until he finished his chemotherapy. The patient is no longer being treated with cisplatin and there's no need to wait to positively augment his hearing. His primary complaint continues to be that of burning in the mid epigastric area with eating or swallowing liquids. He believes his appetite has been improved some but admits that there is a narrow window as to when he can eat something. He and his family are still trying to to get the timing of meals at an optimum for him. He continues on Protonix 40 mg twice daily as well as Zantac 300 once a day. He is also taking the Marinol 2.5 mg by mouth twice daily to stimulate his appetite.  No fevers or chills or he tolerates oxygen 2 liters by nasal cannula. He had cycle #2 of his chemotherapy with carboplatin and etoposide, (reduced dose) with Neulasta support starting on 06/22/2013. He presents today for a symptom management visit.   MEDICAL HISTORY: Past Medical History  Diagnosis Date  . Hyperlipidemia   . Hypertension   . GERD (gastroesophageal reflux disease)   .  Respiratory failure with hypoxia 04/21/2013    secondary to pneumonia/notes 04/21/2013  . Pulmonary embolism     "got one in there now" (04/21/2013)  . COPD (chronic obstructive pulmonary disease)   . Pneumonia 2/?01/2014    "wouldn't get better; hospitalized 04/21/2013)  .  Arthritis     "minor in my right hand" (04/21/2013)  . Cancer     lung ca dx'd 03/2013  . History of radiation therapy 05/03/13-05/18/13    chest & whole brain    INTERIM HISTORY: has GERD (gastroesophageal reflux disease); HCAP (healthcare-associated pneumonia); Respiratory failure with hypoxia; HTN (hypertension); Acute pulmonary embolism; Lung mass; Emphysema lung; Small cell carcinoma of lung; Acute renal failure; Protein-calorie malnutrition, severe; and Anorexia on his problem list.    ALLERGIES:  is allergic to augmentin.  MEDICATIONS: has a current medication list which includes the following prescription(s): albuterol, dexamethasone, dss, dronabinol, emollient, lorazepam, morphine, benecalorie, ondansetron, oxycodone, pantoprazole, prochlorperazine, ranitidine, rivaroxaban, tiotropium, and nystatin.  SURGICAL HISTORY:  Past Surgical History  Procedure Laterality Date  . Inguinal hernia repair Right ~ 2005  . Finger surgery Left ~ 1989    "crushed end of my middle finger off"   . Video bronchoscopy Bilateral 04/26/2013    Procedure: VIDEO BRONCHOSCOPY WITH FLUORO;  Surgeon: Wilhelmina Mcardle, MD;  Location: [facility] ENDOSCOPY;  Service: Cardiopulmonary;  Laterality: Bilateral;    REVIEW OF SYSTEMS:   Constitutional: Denies fevers, chills or abnormal weight loss Eyes: Denies blurriness of vision Ears, nose, mouth, throat, and face: Denies mucositis or sore throat Respiratory: He reports cough, dyspnea  But denies wheezes Cardiovascular: Denies palpitation, chest discomfort or lower extremity swelling Gastrointestinal:  Denies nausea, does report heartburn/ epigastric burning with eating or drinking. No change in bowel habits Skin: Denies abnormal skin rashes Lymphatics: Denies new lymphadenopathy or easy bruising Neurological:Denies numbness, tingling or new weaknesses Behavioral/Psych: Mood is stable, no new changes  All other systems were reviewed with the patient and are  negative.  PHYSICAL EXAMINATION: ECOG PERFORMANCE STATUS: 1 - Symptomatic but completely ambulatory  Blood pressure 99/69, pulse 109, temperature 97.7 F (36.5 C), temperature source Oral, resp. rate 16, height 5\' 7"  (1.702 m), weight 102 lb 8 oz (46.494 kg), SpO2 95.00%, peak flow 2 L/min.  weight is down approximately 6 pounds.  GENERAL:alert, no distress and comfortable, thin chronically ill appearing gentleman on 2 Liters of oxygen via nasal cannula.  SKIN: skin color, texture, turgor are normal, no rashes or significant lesions EYES: normal, Conjunctiva are pink and non-injected, sclera clear OROPHARYNX:no exudate, no erythema and lips, buccal mucosa, and tongue normal  NECK: supple, thyroid normal size, non-tender, without nodularity LYMPH:  no palpable lymphadenopathy in the cervical, axillary or supraclavicular LUNGS: clear to auscultation with normal breathing effort, no wheezes or rhonchi on the left. R lung with decreased breath sound at the bases.  HEART: regular rate & rhythm and no murmurs and no lower extremity edema ABDOMEN:abdomen soft, non-tender and normal bowel sounds Musculoskeletal:no cyanosis of digits and no clubbing  NEURO: alert & oriented x 3 with fluent speech, no focal motor/sensory deficits  Labs:  Lab Results  Component Value Date   WBC 15.8* 06/29/2013   HGB 9.5* 06/29/2013   HCT 29.8* 06/29/2013   MCV 92.2 06/29/2013   PLT 177 06/29/2013   NEUTROABS 14.7* 06/29/2013      Chemistry      Component Value Date/Time   NA 138 06/29/2013 1324   NA 138 05/31/2013 0345   K 4.4  06/29/2013 1324   K 3.7 05/31/2013 0345   CL 103 05/31/2013 0345   CO2 29 06/29/2013 1324   CO2 26 05/31/2013 0345   BUN 22.1 06/29/2013 1324   BUN 8 05/31/2013 0345   CREATININE 0.9 06/29/2013 1324   CREATININE 0.83 05/31/2013 0345      Component Value Date/Time   CALCIUM 9.7 06/29/2013 1324   CALCIUM 8.0* 05/31/2013 0345   ALKPHOS 100 06/29/2013 1324   ALKPHOS 70 05/26/2013 0405   AST 13 06/29/2013 1324   AST  12 05/26/2013 0405   ALT 13 06/29/2013 1324   ALT 12 05/26/2013 0405   BILITOT 1.01 06/29/2013 1324   BILITOT 0.5 05/26/2013 0405       Basic Metabolic Panel:  Recent Labs Lab 06/29/13 1324  NA 138  K 4.4  CO2 29  GLUCOSE 120  BUN 22.1  CREATININE 0.9  CALCIUM 9.7   GFR Estimated Creatinine Clearance: 56.7 ml/min (by C-G formula based on Cr of 0.9). Liver Function Tests:  CBC:  Recent Labs Lab 06/29/13 1324  WBC 15.8*  NEUTROABS 14.7*  HGB 9.5*  HCT 29.8*  MCV 92.2  PLT 177    Studies:  No results found.   RADIOGRAPHIC STUDIES: None  PATHOLOGY: Diagnosis 04/26/2013 Bronchus, biopsy, RLL - POORLY DIFFERENTIATED SMALL CELL NEUROENDOCRINE CARCINOMA. Microscopic Comment The biopsies are extensively involved by an atypical infiltrate with histologic features of small cell carcinoma. Immunohistochemistry is performed and the tumor is positive for CD56, Chromogranin, Synaptophysin, Thyroid Transcription Factor - 1, Cytokeratin 7 and Cytokeratin AE1 / AE3. Tumor is negative for Cytokeratin 56 and mostly negative with p63. The histologic and immunophenotypic characteristics are consistent with poorly differentiated small cell neuroendocrine carcinoma. Case discussed with Dr. Alva Garnet on 04/27/13. (JDP:caf 04/28/13) Claudette Laws MD Pathologist, Electronic Signature (Case signed 04/28/2013) Specimen Gross and Clinical Information Specimen(s) Obtained: Bronchus, biopsy, RLL  ASSESSMENT: Althea Charon 63 y.o. male with a history of Small cell carcinoma of lung - Plan: CBC with Differential, Comprehensive metabolic panel (Cmet) - [facility], nystatin (MYCOSTATIN) 100000 UNIT/ML suspension  63 yo male with newly diagnosed small cell carcinoma of the R lung complicated by obstructive pneumonia, PE. ECOG 1. + weight loss.   PLAN:  1. EXTENSIVE STAGE IV SMALL CELL CARCINOMA OF THE R LUNG WITH +BRAIN METASTASES --Dr. Pablo Ledger completed whole brain XRT on 03/25.  We started chemotherapy on  03/25 and he completed  Cycle #1  On cisplatin 80 mg/m2 on day 1, etoposide 100 mg/m2 days 1,2,3  And for a maximum of 4-6 cycles every 21 days.   He awaits for audiology appointment.  Given his recent hospitalization as detailed above and now complains of hearing fullness and intermittent tinnitus, we  discontinued cisplatin and substitute with carboplatin AUC 5.  In addition, we will decrease etoposide dose by 25% due to profound neutropenia. We will also continue neulasta of Day#4 of his chemotherapy each cycle due to febrile neutropenia.  Cycle #2, day #1 was given on 06/22/2013. Patient was reviewed with Dr. Juliann Mule. We actually further dose reduced his chemotherapy to carboplatin for an AUC of 3 given on day 1 and etoposide at 80 mg per meter square given on days 1, 2 and 3 with Neulasta support on day 4. He managed to complete cycle #2 without being readmitted to the hospital. His weight loss remain concerning. -- CT of abdomen and pelvis and chest performed on 06/15/2013 did show positive response to therapy. There is no evidence of new  or metastatic within the abdomen or pelvis.   2. Gastritis/esophagitis. --Increased protonix to 40 mg bid (up from daily) and continue zantac doing day. As stated above the patient was evaluated by gastroenterology, having an upper GI performed 06/22/2013 to further evaluate her symptoms. This study was read as normal. I asked the patient to hold his Protonix and Zantac and try over-the-counter Zegerid twice daily over the next 2 weeks. If this is helpful to him we'll prescribe it for him at prescription strength versus over-the-counter strength. He may add back the Zantac if needed during the day.  3. Anorexia. --Likely secondary to #1.  He is to continue on Marinol as prescribed.   4. Pain associated with #1, improved. --  Continue oxycodone 5 mg by mouth every 4 hours as need for severe pain. He reports improvement and now continues to use these as needed.   5. PE  (04/21/2013). --CTA of chest noted above consistent with Nonocclusive left upper lobe, right lower lobe pulmonary emboli. Right heart strain.  Continue  xarelto 20 mg daily.   6. Anemia secondary to #1. --Stable, we will continue to monitor  7. Hypertension. --He will continue lisinopril 10 mg daily as tolerated.  8. Mucositis secondary #1.  --Carafate as tolerated.    9. Emphysema --Continue spiriva handihaler 18 mcg inhalation daily. Oxygen 2 liters by [place]  10. Follow-up.  --Patient will follow up in 2 weeks for a symptom management visit prior to starting cycle #3 of his systemic chemotherapy now with reduced dose carboplatin and etoposide with Neulasta support.   11. Oral candidiasis . A prescription for nystatin swish and spit was sent to his pharmacy of record via E. scribed to address the oral candidiasis.  All questions were answered. The patient knows to call the clinic with any problems, questions or concerns. We can certainly see the patient much sooner if necessary.  I spent 25 minutes counseling the patient face to face. The total time spent in the appointment was 35 minutes.    Carlton Adam, PA-C 06/29/2013 4:52 PM

## 2013-06-29 NOTE — Telephone Encounter (Signed)
gv and pritned appt sched and avs for pt for May and June...sed added tx.

## 2013-06-30 ENCOUNTER — Telehealth: Payer: Self-pay | Admitting: *Deleted

## 2013-06-30 ENCOUNTER — Ambulatory Visit
Admission: RE | Admit: 2013-06-30 | Discharge: 2013-06-30 | Disposition: A | Discharge status: Home / Self Care | Payer: BC Managed Care – PPO | Source: Ambulatory Visit | Attending: Radiation Oncology | Admitting: Radiation Oncology

## 2013-06-30 ENCOUNTER — Other Ambulatory Visit: Payer: Self-pay | Admitting: Medical Oncology

## 2013-06-30 ENCOUNTER — Ambulatory Visit: Payer: BC Managed Care – PPO

## 2013-06-30 ENCOUNTER — Telehealth: Payer: Self-pay | Admitting: Internal Medicine

## 2013-06-30 ENCOUNTER — Telehealth: Payer: Self-pay | Admitting: Medical Oncology

## 2013-06-30 ENCOUNTER — Ambulatory Visit (HOSPITAL_BASED_OUTPATIENT_CLINIC_OR_DEPARTMENT_OTHER): Payer: BC Managed Care – PPO

## 2013-06-30 VITALS — BP 102/67 | HR 92 | Temp 98.0°F | Resp 16 | Wt 102.5 lb

## 2013-06-30 DIAGNOSIS — C3490 Malignant neoplasm of unspecified part of unspecified bronchus or lung: Secondary | ICD-10-CM

## 2013-06-30 DIAGNOSIS — E860 Dehydration: Secondary | ICD-10-CM

## 2013-06-30 MED ORDER — SODIUM CHLORIDE 0.9 % IV SOLN
Freq: Once | INTRAVENOUS | Status: AC
  Administered 2013-06-30: 13:00:00 via INTRAVENOUS

## 2013-06-30 NOTE — Patient Instructions (Signed)

## 2013-06-30 NOTE — Progress Notes (Signed)
Department of Radiation Oncology  Phone:  [phone] Fax:        [phone]   Name: PHILLIPS GOULETTE MRN: [id number]  DOB: 06/05/1950  Date: 06/30/2013  Follow Up Visit Note  Diagnosis: Extensive Stage Small Cell Lung Cancer  Summary and Interval since last radiation: 30 gy in 12 fractions to chest and brain completed 3/25/215  Interval History: Sheldon presents today for routine followup.  He was hospitalized shortly after radiation ended. He is struggling with poor appetite, taste changes, tinnitus, decreased hearing and overall lethargy.  He has tried lemon drops, zinc tablets and marinol for the taste changes and appetite with really no luck.  He is on diflucan for thrush. He denies any headache. He had an EGD which did not show any esophagitis or esophageal stricture.  He is able to lay down better and was able to sleep in his bed for thee first tim last night (His CT of the C/A/P on 4/22 showed an excellent response to treatment and reinflation of the right lower lobe.) He is getting fluids right now due to low blood pressure he had this morning.  He wishes to cancel his appointment with the dietician as he knows what he has to do.  He has been evaluated by an audiologist for hearing aids and has that appointment for Wednesday. He has not seen ENT  Allergies:  Allergies  Allergen Reactions  . Augmentin [Amoxicillin-Pot Clavulanate] Itching and Rash    rash    Medications:  Current Outpatient Prescriptions  Medication Sig Dispense Refill  . albuterol (PROVENTIL HFA;VENTOLIN HFA) 108 (90 BASE) MCG/ACT inhaler Inhale 2 puffs into the lungs every 6 (six) hours as needed for wheezing or shortness of breath.  1 Inhaler  2  . dexamethasone (DECADRON) 4 MG tablet Take 4 mg by mouth 2 (two) times daily with a meal. Take 2 tablets once daily the day after chemo then take 2 tablets twice daily for two days      . Docusate Sodium (DSS) 100 MG CAPS Take 100 mg by mouth as needed.      .  dronabinol (MARINOL) 2.5 MG capsule Take 1 capsule (2.5 mg total) by mouth 2 (two) times daily before a meal.  60 capsule  1  . emollient (BIAFINE) cream Apply topically 2 (two) times daily. To left ear.      [name] LORazepam (ATIVAN) 0.5 MG tablet Take 1 tablet (0.5 mg total) by mouth every 8 (eight) hours.  60 tablet  0  . morphine (MS CONTIN) 30 MG 12 hr tablet Take 1 tablet (30 mg total) by mouth every 12 (twelve) hours.  60 tablet  0  . Nutritional Supplements (BENECALORIE) LIQD Take 44 mLs (1 packet total) by mouth 2 (two) times daily between meals.  2500 mL  1  . nystatin (MYCOSTATIN) 100000 UNIT/ML suspension Swish and Spit 5 ml 4 times daily  240 mL  0  . ondansetron (ZOFRAN) 8 MG tablet       . oxyCODONE (OXY IR/ROXICODONE) 5 MG immediate release tablet Take 1 tablet (5 mg total) by mouth every 4 (four) hours as needed for severe pain.  60 tablet  0  . prochlorperazine (COMPAZINE) 10 MG tablet Take 10 mg by mouth every 6 (six) hours as needed for nausea or vomiting.      . Rivaroxaban (XARELTO) 20 MG TABS tablet Take 1 tablet (20 mg total) by mouth daily with supper.  30 tablet  0  . tiotropium (  SPIRIVA HANDIHALER) 18 MCG inhalation capsule Place 1 capsule (18 mcg total) into inhaler and inhale daily.  30 capsule  11  . pantoprazole (PROTONIX) 40 MG tablet Take 1 tablet (40 mg total) by mouth daily.  90 tablet  0  . ranitidine (ZANTAC) 300 MG capsule Take 1 capsule (300 mg total) by mouth daily.  90 capsule  0   No current facility-administered medications for this encounter.    Physical Exam:  Filed Vitals:   06/30/13 1437  BP: 102/67  Pulse: 92  Temp: 98 F (36.7 C)  Resp: 16  Weight: 102 lb 8 oz (46.494 kg)  SpO2: 95%   Appears cachetic but alert. Normal respiratory effort. Nasal cannula in place.   IMPRESSION: Sharrieff is a 63 y.o. male s/p palliative RT to the chest and brain.   PLAN:  We discussed the cyclce of not eating, not healing and decreased energy. I encouraged him to  add more protein to his food and eat and drink as much as he can. We discussed possible effusion in his ear and I have recommended a referral to ENT to investigate that before they spend a bunch of money on a hearing aid.  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 Overall I think he is doing better than he was when I first met him although the challenges are different.  I have recommended follow up and an MRI scan in 2 months.     [name], MD

## 2013-06-30 NOTE — Progress Notes (Signed)
Patient for routine one month follow up completion of radiation to chest and brain.had a round of chemotherapy (cisplatin and etoposide) on last week.Received call from daughter earlier today that blood pressure was 79/63, informed them to go to emergency department but instead he was fitted in to infusion for iv fluids.Some weight loss of 5 lbs since completion of radiation.DG UGI performed on 06/21/13 was normal.

## 2013-06-30 NOTE — Telephone Encounter (Signed)
Cancel patient nutrition consult  On 07/04/13  He knows what hes to do with nutrition 3:42 PM'

## 2013-06-30 NOTE — Telephone Encounter (Signed)
Ricky Villanueva called left a message  stating that her dad's BP is 79/60 and P 90. She asked to be called back urgently. When I returned her call she had called Dr. Unknown Jim nurse and they were told to go to the ED.  Pt saw Dr. Juliann Mule yesterday and BP was 99/69 and P 109. Dr.Chism feels pt needs fluids and void the ED if possible. I called [name] and she states she can bring him here instead. I called down to Rad-Onc and spoke with Enid Derry to let her know he will be here getting fluids and he can keep his appointment with Dr. Pablo Ledger. She will notify Val.

## 2013-07-01 ENCOUNTER — Telehealth: Payer: Self-pay | Admitting: *Deleted

## 2013-07-01 ENCOUNTER — Ambulatory Visit (HOSPITAL_BASED_OUTPATIENT_CLINIC_OR_DEPARTMENT_OTHER): Payer: BC Managed Care – PPO

## 2013-07-01 ENCOUNTER — Telehealth: Payer: Self-pay | Admitting: Internal Medicine

## 2013-07-01 ENCOUNTER — Other Ambulatory Visit: Payer: Self-pay | Admitting: Medical Oncology

## 2013-07-01 VITALS — BP 105/71 | HR 73 | Temp 97.0°F | Resp 18

## 2013-07-01 DIAGNOSIS — C3490 Malignant neoplasm of unspecified part of unspecified bronchus or lung: Secondary | ICD-10-CM

## 2013-07-01 DIAGNOSIS — E860 Dehydration: Secondary | ICD-10-CM

## 2013-07-01 MED ORDER — SODIUM CHLORIDE 0.9 % IV SOLN
Freq: Once | INTRAVENOUS | Status: AC
  Administered 2013-07-01: 14:00:00 via INTRAVENOUS

## 2013-07-01 NOTE — Patient Instructions (Signed)
Dehydration, Adult  Dehydration means your body does not have as much fluid as it needs. Your kidneys, brain, and heart will not work properly without the right amount of fluids and salt.   HOME CARE   Ask your doctor how to replace body fluid losses (rehydrate).   Drink enough fluids to keep your pee (urine) clear or pale yellow.   Drink small amounts of fluids often if you feel sick to your stomach (nauseous) or throw up (vomit).   Eat like you normally do.   Avoid:   Foods or drinks high in sugar.   Bubbly (carbonated) drinks.   Juice.   Very hot or cold fluids.   Drinks with caffeine.   Fatty, greasy foods.   Alcohol.   Tobacco.   Eating too much.   Gelatin desserts.   Wash your hands to avoid spreading germs (bacteria, viruses).   Only take medicine as told by your doctor.   Keep all doctor visits as told.  GET HELP RIGHT AWAY IF:    You cannot drink something without throwing up.   You get worse even with treatment.   Your vomit has blood in it or looks greenish.   Your poop (stool) has blood in it or looks black and tarry.   You have not peed in 6 to 8 hours.   You pee a small amount of very dark pee.   You have a fever.   You pass out (faint).   You have belly (abdominal) pain that gets worse or stays in one spot (localizes).   You have a rash, stiff neck, or bad headache.   You get easily annoyed, sleepy, or are hard to wake up.   You feel weak, dizzy, or very thirsty.  MAKE SURE YOU:    Understand these instructions.   Will watch your condition.   Will get help right away if you are not doing well or get worse.  Document Released: 12/07/2008 Document Revised: 05/05/2011 Document Reviewed: 09/30/2010  [employer] Patient Information 2014 [employer].

## 2013-07-01 NOTE — Telephone Encounter (Signed)
pt came in and expressed that he did not want to keep nut appt...cx per pt request

## 2013-07-01 NOTE — Telephone Encounter (Signed)
Called patient to of appt. With Dr. Constance Holster on 07-05-13 - arrival time - 1:15 pm and his MRI on 08-30-13- arrival time - 12:45 p.m. @ [facility], lvm for a return call

## 2013-07-01 NOTE — Telephone Encounter (Signed)
I ave called and gave the patient's wife an apapt for tomorrow

## 2013-07-02 ENCOUNTER — Ambulatory Visit (HOSPITAL_BASED_OUTPATIENT_CLINIC_OR_DEPARTMENT_OTHER): Payer: BC Managed Care – PPO

## 2013-07-02 VITALS — BP 106/60 | HR 86 | Temp 97.5°F | Resp 22

## 2013-07-02 DIAGNOSIS — R630 Anorexia: Secondary | ICD-10-CM

## 2013-07-02 DIAGNOSIS — C7B8 Other secondary neuroendocrine tumors: Secondary | ICD-10-CM

## 2013-07-02 DIAGNOSIS — C7A1 Malignant poorly differentiated neuroendocrine tumors: Secondary | ICD-10-CM

## 2013-07-02 MED ORDER — SODIUM CHLORIDE 0.9 % IV SOLN
Freq: Once | INTRAVENOUS | Status: AC
  Administered 2013-07-02: 08:00:00 via INTRAVENOUS

## 2013-07-04 ENCOUNTER — Encounter: Payer: BC Managed Care – PPO | Admitting: Nutrition

## 2013-07-07 ENCOUNTER — Other Ambulatory Visit: Payer: Self-pay | Admitting: Internal Medicine

## 2013-07-08 ENCOUNTER — Telehealth: Payer: Self-pay

## 2013-07-08 NOTE — Telephone Encounter (Signed)
Daughter called that pt had dry hacking cough intemittantly. No congestion, no post nasal drip, no fever, no muscle or body aches. S/w Dr Juliann Mule. Instructed pt to take OTC cough medicine or cough drops. If any s/s of infection start to show to give [place] a call.

## 2013-07-11 ENCOUNTER — Ambulatory Visit (HOSPITAL_BASED_OUTPATIENT_CLINIC_OR_DEPARTMENT_OTHER): Payer: BC Managed Care – PPO

## 2013-07-11 ENCOUNTER — Other Ambulatory Visit: Payer: Self-pay

## 2013-07-11 ENCOUNTER — Telehealth: Payer: Self-pay

## 2013-07-11 VITALS — BP 91/59 | HR 77 | Temp 97.8°F | Resp 17

## 2013-07-11 DIAGNOSIS — C3490 Malignant neoplasm of unspecified part of unspecified bronchus or lung: Secondary | ICD-10-CM

## 2013-07-11 DIAGNOSIS — C7A1 Malignant poorly differentiated neuroendocrine tumors: Secondary | ICD-10-CM

## 2013-07-11 DIAGNOSIS — E860 Dehydration: Secondary | ICD-10-CM

## 2013-07-11 MED ORDER — SODIUM CHLORIDE 0.9 % IV SOLN: Freq: Once | INTRAVENOUS | Status: DC

## 2013-07-11 MED ORDER — SODIUM CHLORIDE 0.9 % IV SOLN
INTRAVENOUS | Status: DC
  Administered 2013-07-11: 15:00:00 via INTRAVENOUS

## 2013-07-11 NOTE — Telephone Encounter (Signed)
[name] called stating pt is dehydrated and they would like an infusion. S/w Dr Juliann Mule and arranged 2pm time.

## 2013-07-11 NOTE — Patient Instructions (Signed)
Dehydration, Adult  Dehydration means your body does not have as much fluid as it needs. Your kidneys, brain, and heart will not work properly without the right amount of fluids and salt.   HOME CARE   Ask your doctor how to replace body fluid losses (rehydrate).   Drink enough fluids to keep your pee (urine) clear or pale yellow.   Drink small amounts of fluids often if you feel sick to your stomach (nauseous) or throw up (vomit).   Eat like you normally do.   Avoid:   Foods or drinks high in sugar.   Bubbly (carbonated) drinks.   Juice.   Very hot or cold fluids.   Drinks with caffeine.   Fatty, greasy foods.   Alcohol.   Tobacco.   Eating too much.   Gelatin desserts.   Wash your hands to avoid spreading germs (bacteria, viruses).   Only take medicine as told by your doctor.   Keep all doctor visits as told.  GET HELP RIGHT AWAY IF:    You cannot drink something without throwing up.   You get worse even with treatment.   Your vomit has blood in it or looks greenish.   Your poop (stool) has blood in it or looks black and tarry.   You have not peed in 6 to 8 hours.   You pee a small amount of very dark pee.   You have a fever.   You pass out (faint).   You have belly (abdominal) pain that gets worse or stays in one spot (localizes).   You have a rash, stiff neck, or bad headache.   You get easily annoyed, sleepy, or are hard to wake up.   You feel weak, dizzy, or very thirsty.  MAKE SURE YOU:    Understand these instructions.   Will watch your condition.   Will get help right away if you are not doing well or get worse.  Document Released: 12/07/2008 Document Revised: 05/05/2011 Document Reviewed: 09/30/2010  [employer] Patient Information 2014 [employer].

## 2013-07-12 ENCOUNTER — Ambulatory Visit (HOSPITAL_BASED_OUTPATIENT_CLINIC_OR_DEPARTMENT_OTHER): Payer: BC Managed Care – PPO

## 2013-07-12 VITALS — BP 95/61 | HR 75 | Temp 97.8°F | Resp 19

## 2013-07-12 DIAGNOSIS — E860 Dehydration: Secondary | ICD-10-CM

## 2013-07-12 DIAGNOSIS — C7A1 Malignant poorly differentiated neuroendocrine tumors: Secondary | ICD-10-CM

## 2013-07-12 MED ORDER — SODIUM CHLORIDE 0.9 % IV SOLN
1000.0000 mL | Freq: Once | INTRAVENOUS | Status: AC
  Administered 2013-07-12: 15:00:00 via INTRAVENOUS

## 2013-07-12 NOTE — Patient Instructions (Signed)
Dehydration, Adult Dehydration is when you lose more fluids from the body than you take in. Vital organs like the kidneys, brain, and heart cannot function without a proper amount of fluids and salt. Any loss of fluids from the body can cause dehydration.  CAUSES   Vomiting.  Diarrhea.  Excessive sweating.  Excessive urine output.  Fever. SYMPTOMS  Mild dehydration  Thirst.  Dry lips.  Slightly dry mouth. Moderate dehydration  Very dry mouth.  Sunken eyes.  Skin does not bounce back quickly when lightly pinched and released.  Dark urine and decreased urine production.  Decreased tear production.  Headache. Severe dehydration  Very dry mouth.  Extreme thirst.  Rapid, weak pulse (more than 100 beats per minute at rest).  Cold hands and feet.  Not able to sweat in spite of heat and temperature.  Rapid breathing.  Blue lips.  Confusion and lethargy.  Difficulty being awakened.  Minimal urine production.  No tears. DIAGNOSIS  Your caregiver will diagnose dehydration based on your symptoms and your exam. Blood and urine tests will help confirm the diagnosis. The diagnostic evaluation should also identify the cause of dehydration. TREATMENT  Treatment of mild or moderate dehydration can often be done at home by increasing the amount of fluids that you drink. It is best to drink small amounts of fluid more often. Drinking too much at one time can make vomiting worse. Refer to the home care instructions below. Severe dehydration needs to be treated at the hospital where you will probably be given intravenous (IV) fluids that contain water and electrolytes. HOME CARE INSTRUCTIONS   Ask your caregiver about specific rehydration instructions.  Drink enough fluids to keep your urine clear or pale yellow.  Drink small amounts frequently if you have nausea and vomiting.  Eat as you normally do.  Avoid:  Foods or drinks high in sugar.  Carbonated  drinks.  Juice.  Extremely hot or cold fluids.  Drinks with caffeine.  Fatty, greasy foods.  Alcohol.  Tobacco.  Overeating.  Gelatin desserts.  Wash your hands well to avoid spreading bacteria and viruses.  Only take over-the-counter or prescription medicines for pain, discomfort, or fever as directed by your caregiver.  Ask your caregiver if you should continue all prescribed and over-the-counter medicines.  Keep all follow-up appointments with your caregiver. SEEK MEDICAL CARE IF:  You have abdominal pain and it increases or stays in one area (localizes).  You have a rash, stiff neck, or severe headache.  You are irritable, sleepy, or difficult to awaken.  You are weak, dizzy, or extremely thirsty. SEEK IMMEDIATE MEDICAL CARE IF:   You are unable to keep fluids down or you get worse despite treatment.  You have frequent episodes of vomiting or diarrhea.  You have blood or green matter (bile) in your vomit.  You have blood in your stool or your stool looks black and tarry.  You have not urinated in 6 to 8 hours, or you have only urinated a small amount of very dark urine.  You have a fever.  You faint. MAKE SURE YOU:   Understand these instructions.  Will watch your condition.  Will get help right away if you are not doing well or get worse. Document Released: 02/10/2005 Document Revised: 05/05/2011 Document Reviewed: 09/30/2010 [employer] Patient Information 2014 [employer].  

## 2013-07-13 ENCOUNTER — Ambulatory Visit (HOSPITAL_BASED_OUTPATIENT_CLINIC_OR_DEPARTMENT_OTHER): Payer: BC Managed Care – PPO | Admitting: Internal Medicine

## 2013-07-13 ENCOUNTER — Encounter: Payer: BC Managed Care – PPO | Admitting: Nutrition

## 2013-07-13 ENCOUNTER — Other Ambulatory Visit (HOSPITAL_BASED_OUTPATIENT_CLINIC_OR_DEPARTMENT_OTHER): Payer: BC Managed Care – PPO

## 2013-07-13 ENCOUNTER — Ambulatory Visit (HOSPITAL_BASED_OUTPATIENT_CLINIC_OR_DEPARTMENT_OTHER): Payer: BC Managed Care – PPO

## 2013-07-13 VITALS — BP 89/68 | HR 97 | Temp 97.9°F | Resp 19 | Ht 67.0 in | Wt 103.0 lb

## 2013-07-13 DIAGNOSIS — C7A1 Malignant poorly differentiated neuroendocrine tumors: Secondary | ICD-10-CM

## 2013-07-13 DIAGNOSIS — C3490 Malignant neoplasm of unspecified part of unspecified bronchus or lung: Secondary | ICD-10-CM

## 2013-07-13 DIAGNOSIS — D630 Anemia in neoplastic disease: Secondary | ICD-10-CM

## 2013-07-13 DIAGNOSIS — R918 Other nonspecific abnormal finding of lung field: Secondary | ICD-10-CM

## 2013-07-13 DIAGNOSIS — R630 Anorexia: Secondary | ICD-10-CM

## 2013-07-13 DIAGNOSIS — Z5111 Encounter for antineoplastic chemotherapy: Secondary | ICD-10-CM

## 2013-07-13 DIAGNOSIS — E860 Dehydration: Secondary | ICD-10-CM

## 2013-07-13 DIAGNOSIS — E43 Unspecified severe protein-calorie malnutrition: Secondary | ICD-10-CM

## 2013-07-13 DIAGNOSIS — I2699 Other pulmonary embolism without acute cor pulmonale: Secondary | ICD-10-CM

## 2013-07-13 DIAGNOSIS — I959 Hypotension, unspecified: Secondary | ICD-10-CM

## 2013-07-13 DIAGNOSIS — C7B8 Other secondary neuroendocrine tumors: Secondary | ICD-10-CM

## 2013-07-13 LAB — CBC WITH DIFFERENTIAL/PLATELET
BASO%: 0.7 % (ref 0.0–2.0)
Basophils Absolute: 0.1 10*3/uL (ref 0.0–0.1)
EOS ABS: 0.1 10*3/uL (ref 0.0–0.5)
EOS%: 0.6 % (ref 0.0–7.0)
HCT: 29.5 % — ABNORMAL LOW (ref 38.4–49.9)
HGB: 9.6 g/dL — ABNORMAL LOW (ref 13.0–17.1)
LYMPH%: 6.6 % — ABNORMAL LOW (ref 14.0–49.0)
MCH: 30.3 pg (ref 27.2–33.4)
MCHC: 32.4 g/dL (ref 32.0–36.0)
MCV: 93.5 fL (ref 79.3–98.0)
MONO#: 0.6 10*3/uL (ref 0.1–0.9)
MONO%: 6.9 % (ref 0.0–14.0)
NEUT%: 85.2 % — ABNORMAL HIGH (ref 39.0–75.0)
NEUTROS ABS: 7.7 10*3/uL — AB (ref 1.5–6.5)
PLATELETS: 586 10*3/uL — AB (ref 140–400)
RBC: 3.16 10*6/uL — AB (ref 4.20–5.82)
RDW: 17.1 % — ABNORMAL HIGH (ref 11.0–14.6)
WBC: 9 10*3/uL (ref 4.0–10.3)
lymph#: 0.6 10*3/uL — ABNORMAL LOW (ref 0.9–3.3)

## 2013-07-13 LAB — COMPREHENSIVE METABOLIC PANEL (CC13)
ALBUMIN: 3.1 g/dL — AB (ref 3.5–5.0)
ALT: 14 U/L (ref 0–55)
ANION GAP: 10 meq/L (ref 3–11)
AST: 15 U/L (ref 5–34)
Alkaline Phosphatase: 78 U/L (ref 40–150)
BUN: 5.8 mg/dL — ABNORMAL LOW (ref 7.0–26.0)
CO2: 25 meq/L (ref 22–29)
Calcium: 9.1 mg/dL (ref 8.4–10.4)
Chloride: 105 mEq/L (ref 98–109)
Creatinine: 0.8 mg/dL (ref 0.7–1.3)
Glucose: 118 mg/dl (ref 70–140)
POTASSIUM: 4.2 meq/L (ref 3.5–5.1)
SODIUM: 140 meq/L (ref 136–145)
TOTAL PROTEIN: 6.2 g/dL — AB (ref 6.4–8.3)
Total Bilirubin: 0.3 mg/dL (ref 0.20–1.20)

## 2013-07-13 MED ORDER — SODIUM CHLORIDE 0.9 % IV SOLN
Freq: Once | INTRAVENOUS | Status: AC
  Administered 2013-07-13: 15:00:00 via INTRAVENOUS

## 2013-07-13 MED ORDER — SODIUM CHLORIDE 0.9 % IV SOLN
Freq: Once | INTRAVENOUS | Status: DC
Start: 2013-07-21 — End: 2013-07-13

## 2013-07-13 MED ORDER — SODIUM CHLORIDE 0.9 % IV SOLN: Freq: Once | INTRAVENOUS | Status: DC

## 2013-07-13 MED ORDER — ONDANSETRON 16 MG/50ML IVPB (CHCC)
INTRAVENOUS | Status: AC
  Filled 2013-07-13: qty 16

## 2013-07-13 MED ORDER — SODIUM CHLORIDE 0.9 % IV SOLN
260.0000 mg | Freq: Once | INTRAVENOUS | Status: AC
  Administered 2013-07-13: 260 mg via INTRAVENOUS
  Filled 2013-07-13: qty 26

## 2013-07-13 MED ORDER — DEXAMETHASONE SODIUM PHOSPHATE 20 MG/5ML IJ SOLN
INTRAMUSCULAR | Status: AC
  Filled 2013-07-13: qty 5

## 2013-07-13 MED ORDER — ETOPOSIDE CHEMO INJECTION 1 GM/50ML
75.0000 mg/m2 | Freq: Once | INTRAVENOUS | Status: AC
  Administered 2013-07-13: 120 mg via INTRAVENOUS
  Filled 2013-07-13: qty 6

## 2013-07-13 MED ORDER — ONDANSETRON 16 MG/50ML IVPB (CHCC)
16.0000 mg | Freq: Once | INTRAVENOUS | Status: AC
  Administered 2013-07-13: 16 mg via INTRAVENOUS

## 2013-07-13 MED ORDER — DEXAMETHASONE SODIUM PHOSPHATE 20 MG/5ML IJ SOLN
20.0000 mg | Freq: Once | INTRAMUSCULAR | Status: AC
Start: 2013-07-13 — End: 2013-07-13
  Administered 2013-07-13: 20 mg via INTRAVENOUS

## 2013-07-13 NOTE — Progress Notes (Signed)
[place] OFFICE PROGRESS NOTE  Ricky Villanueva, [place] [address]  DIAGNOSIS: Small cell carcinoma of lung  Anorexia  Acute pulmonary embolism  Lung mass  Protein-calorie malnutrition, severe  Chief Complaint  Patient presents with  . Anorexia    CURRENT TREATMENT: Cycle # 1 of Carboplatin (on day#1 of 260 mg)  plus etoposide (75 mg/m2 daily on 04/29, 04/30 and 5/1) started on 06/22/2013.  He completed neulasta on 06/25/2013.    PRIOR TREATMENT:  WB XRT started on 03/10 - 03/25 per Dr. Pablo Ledger. He completed one cycle of cisplatin (79 mg/m2 on day #1) plus etoposide (100 mg/m2 on 3/25, 3/26 and 05/20/2013.  He is scheduled for cycle #2 of Carbo plus etoposide starting today.   INTERVAL HISTORY: Ricky Villanueva 63 y.o. male with a history of small cell lung cancer is here for follow up. He was last seen by Awilda Metro on 06/29/2013.   His past medical history also includes HTN/Hyperlipidemia and longstanding smoking history but quit 12/25/2011 presented to ER on 04/21/13 with worsening dyspnea, cough with streaks of blood over the past one week. In the ED, his oxygen saturation was 91% on 2 L nasal canula. CXR revealed a progressive increased density in the right lower lobe consistent with worsening atelectasis or pneumonia and a soft tissue fullness in the hilar region suspicious for lymphadenopathy or mass.and follow-up CT of chest was recommended. It revealed a nonocclusive left upper lobe, right lower lobe pulmonary emboli with right heart strain. Bulky mediastinal and right hilar lymphadenopathy, encasing the of pulmonary arteries, pulmonary veins and bronchi, completely effacing right lower lobe segmental bronchi. Extensive consolidation in the right lung suggested postobstructive pneumonia, with interstitial prominence which may reflect superimposed lymphangitic spread of infection or neoplasm. He was admitted and started on antibiotics and  heparin. Pulmonary was consulted and he had a video bronchoscopy on 03/03 with consistent with small cell ca of lung. 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 He had WBXRT as noted above. He then received his first cycle of chemotherapy on 03/25 thru 03/27 and was admitted 04/01 and discharged on 05/31/13 due to dehydration, acute renal failure, mucositis and subsequently had febrile neutropenia. We restarted the Xarelto which held due to profound thrombocytopenia.  Today, he is accompanied by his wife, daughter and son.  He reports feeling better overall. He relates the ativan to problems with fatigue and requests to decrease the dose by half. Dr. 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   MEDICAL HISTORY: Past Medical History  Diagnosis Date  . Hyperlipidemia   . Hypertension   . GERD (gastroesophageal reflux disease)   . Respiratory failure with hypoxia 04/21/2013    secondary to pneumonia/notes 04/21/2013  . Pulmonary embolism     "got one in there now" (04/21/2013)  . COPD (chronic obstructive pulmonary disease)   . Pneumonia 2/?01/2014    "wouldn't get better; hospitalized 04/21/2013)  . Arthritis     "minor in my right hand" (04/21/2013)  . Cancer     lung ca dx'd 03/2013  . History of radiation therapy 05/03/13-05/18/13    chest & whole brain    INTERIM HISTORY: has GERD (gastroesophageal reflux disease); HCAP (healthcare-associated pneumonia); Respiratory failure with hypoxia; HTN (hypertension); Acute pulmonary embolism; Lung mass; Emphysema lung; Small cell carcinoma of  lung; Acute renal failure; Protein-calorie malnutrition, severe; and Anorexia on his problem list.    ALLERGIES:  is allergic to augmentin.  MEDICATIONS: has a current medication list which includes the following prescription(s): albuterol, dexamethasone, dss,  lorazepam, morphine, nystatin, omeprazole-sodium bicarbonate, ondansetron, oxycodone, prochlorperazine, tiotropium, and xarelto.  SURGICAL HISTORY:  Past Surgical History  Procedure Laterality Date  . Inguinal hernia repair Right ~ 2005  . Finger surgery Left ~ 1989    "crushed end of my middle finger off"   . Video bronchoscopy Bilateral 04/26/2013    Procedure: VIDEO BRONCHOSCOPY WITH FLUORO;  Surgeon: Wilhelmina Mcardle, MD;  Location: [facility] ENDOSCOPY;  Service: Cardiopulmonary;  Laterality: Bilateral;    REVIEW OF SYSTEMS:   Constitutional: Denies fevers, chills or abnormal weight loss Eyes: Denies blurriness of vision Ears, nose, mouth, throat, and face: Denies mucositis or sore throat Respiratory: Denies cough, dyspnea or wheezes Cardiovascular: Denies palpitation, chest discomfort or lower extremity swelling Gastrointestinal:  Denies nausea, heartburn or change in bowel habits Skin: Denies abnormal skin rashes Lymphatics: Denies new lymphadenopathy or easy bruising Neurological:Denies numbness, tingling or new weaknesses Behavioral/Psych: Mood is stable, no new changes  All other systems were reviewed with the patient and are negative.  PHYSICAL EXAMINATION: ECOG PERFORMANCE STATUS: 0 - Asymptomatic  Blood pressure 89/68, pulse 97, temperature 97.9 F (36.6 C), resp. rate 19, height 5\' 7"  (1.702 m), weight 103 lb (46.72 kg), SpO2 99.00%.  GENERAL:alert, no distress and comfortable; HOH; chronically ill appearing, alopecia.  SKIN: skin color, texture, turgor are normal, no rashes or significant lesions EYES: normal, Conjunctiva are pink and non-injected, sclera clear OROPHARYNX:no exudate, no erythema and lips, buccal mucosa, and tongue normal  NECK: supple, thyroid normal size, non-tender, without nodularity LYMPH:  no palpable lymphadenopathy in the cervical, axillary or supraclavicular LUNGS: clear to auscultation with normal breathing effort, no wheezes or rhonchi HEART:  regular rate & rhythm and no murmurs and no lower extremity edema ABDOMEN:abdomen soft, non-tender and normal bowel sounds Musculoskeletal:no cyanosis of digits and no clubbing  NEURO: alert & oriented x 3 with fluent speech, no focal motor/sensory deficits; Cranial nerves II - XII intact.  Negative rhomberge.  Strength 5/5 in upper and lower extremities bilaterally.  Gait, normal.   Labs:  Lab Results  Component Value Date   WBC 9.0 07/13/2013   HGB 9.6* 07/13/2013   HCT 29.5* 07/13/2013   MCV 93.5 07/13/2013   PLT 586* 07/13/2013   NEUTROABS 7.7* 07/13/2013      Chemistry      Component Value Date/Time   NA 138 06/29/2013 1324   NA 138 05/31/2013 0345   K 4.4 06/29/2013 1324   K 3.7 05/31/2013 0345   CL 103 05/31/2013 0345   CO2 29 06/29/2013 1324   CO2 26 05/31/2013 0345   BUN 22.1 06/29/2013 1324   BUN 8 05/31/2013 0345   CREATININE 0.9 06/29/2013 1324   CREATININE 0.83 05/31/2013 0345      Component Value Date/Time   CALCIUM 9.7 06/29/2013 1324   CALCIUM 8.0* 05/31/2013 0345   ALKPHOS 100 06/29/2013 1324   ALKPHOS 70 05/26/2013 0405   AST 13 06/29/2013 1324   AST 12 05/26/2013 0405   ALT 13 06/29/2013 1324   ALT 12 05/26/2013 0405   BILITOT 1.01 06/29/2013 1324   BILITOT 0.5 05/26/2013 0405     CBC:  Recent Labs Lab 07/13/13 1325  WBC 9.0  NEUTROABS 7.7*  HGB 9.6*  HCT 29.5*  MCV 93.5  PLT  586*    Anemia work up No results found for this basename: VITAMINB12, FOLATE, FERRITIN, TIBC, IRON, RETICCTPCT,  in the last 72 hours  Studies:  No results found.   RADIOGRAPHIC STUDIES: Ct Chest W Contrast  06/15/2013   CLINICAL DATA:  History of small cell cancer diagnosed in February 2015. Chemotherapy and radiation therapy in progress. Epigastric pain. Weight loss.  EXAM: CT CHEST, ABDOMEN, AND PELVIS WITH CONTRAST  TECHNIQUE: Multidetector CT imaging of the chest, abdomen and pelvis was performed following the standard protocol during bolus administration of intravenous contrast.  CONTRAST:  63mL  OMNIPAQUE IOHEXOL 300 MG/ML  SOLN  COMPARISON:  Chest CT 04/21/2013. CT of the abdomen and pelvis 04/28/2013.  FINDINGS: CT CHEST FINDINGS  Mediastinum: Compared to the prior study, the previously noted mediastinal adenopathy has significantly improved. High right paratracheal lymph node has resolved. Low right peritracheal lymph nodes significantly decreased in size, currently measuring 2.3 x 1.4 cm. Subcarinal lymph node mass significantly decreased in size, currently measuring 1.6 x 4.2 cm. Previously noted right hilar lymphadenopathy is no longer evident, although there continues to be some soft tissue thickening of the peribronchovascular interstitium in the right hilar region. Heart size is normal. There is no significant pericardial fluid, thickening or pericardial calcification. There is atherosclerosis of the thoracic aorta, the great vessels of the mediastinum and the coronary arteries, including calcified atherosclerotic plaque in the left main, left anterior descending, left circumflex and right coronary arteries. Esophagus is unremarkable in appearance.  Lungs/Pleura: Compared to the prior study the previously noted mass centered in the perihilar aspect of the right lung, predominantly involving the right lower lobe has essentially completely resolved. Likewise, the postobstructive changes throughout the right lower lobe have resolved compared to the prior examination. There is a new linear opacity in the right lower lobe, favored to represent post infectious or inflammatory scarring. Associated with this there is a focal area retraction of the overlying pleura and a well-defined ovoid shaped 2.7 x 1.9 cm low-attenuation lesion in the posterolateral aspect of the right lung which is unusual in appearance, but favored to represent a fluid-filled cavity, potentially a bronchocele, or an unusual invagination of pleural fluid into the lung parenchyma in the setting of prior infection and scarring. This has  a generally benign appearance. Trace amount of residual right-sided pleural fluid partially loculated adjacent to this lesion in the lower right hemithorax. Background of mild to moderate centrilobular and paraseptal emphysema. No new suspicious appearing pulmonary nodules or masses.  Musculoskeletal: There are no aggressive appearing lytic or blastic lesions noted in the visualized portions of the skeleton.  CT ABDOMEN AND PELVIS FINDINGS  Abdomen/Pelvis: 2 of the previously noted sub cm low-attenuation lesions in the left lobe of the liver have resolved compared to the prior study. One of the previously noted subcentimeter low attenuation lesions in the right lobe of the liver is unchanged and too small to characterize (image 63 of series 2). No new liver lesions are noted. The appearance of the gallbladder, pancreas, spleen and bilateral adrenal glands is unremarkable. Several sub cm low-attenuation lesions are noted in the kidneys bilaterally, and are unchanged compared to the prior study, favored to represent small cysts, although too small to definitively characterize. 1.6 cm exophytic simple cyst in the interpolar region of the left kidney is unchanged. Extensive atherosclerosis throughout the abdominal and pelvic vasculature, without evidence of aneurysm or dissection. No significant volume of ascites. No pneumoperitoneum. No pathologic distention of small bowel. No  definite lymphadenopathy identified within the abdomen or pelvis. Prostate gland and urinary bladder are unremarkable in appearance.  Musculoskeletal: There are no aggressive appearing lytic or blastic lesions noted in the visualized portions of the skeleton.  IMPRESSION: 1. Today's study demonstrates a positive response to therapy with progression of the previously noted right hilar mass, and decreased size of mediastinal lymphadenopathy. 2. No findings to suggest new metastatic disease in the abdomen or pelvis. 3. Interval resolution of the  majority of the postobstructive changes in the right lower lobe. There is what appears to be an unusual area of scarring in the right lower lobe on today's examination, with a small fluid attenuation component which may either represent a bronchocele or an invaginated pleural fluid collection into an area of scarring within the right lower lobe. Attention on followup studies is recommended. 4. Atherosclerosis, including left main and 3 vessel coronary artery disease. Assessment for potential risk factor modification, dietary therapy or pharmacologic therapy may be warranted, if clinically indicated. 5. Additional incidental findings, as above.   Electronically Signed   By: Vinnie Langton M.D.   On: 06/15/2013 16:49   Ct Abdomen Pelvis W Contrast  06/15/2013   CLINICAL DATA:  History of small cell cancer diagnosed in February 2015. Chemotherapy and radiation therapy in progress. Epigastric pain. Weight loss.  EXAM: CT CHEST, ABDOMEN, AND PELVIS WITH CONTRAST  TECHNIQUE: Multidetector CT imaging of the chest, abdomen and pelvis was performed following the standard protocol during bolus administration of intravenous contrast.  CONTRAST:  34mL OMNIPAQUE IOHEXOL 300 MG/ML  SOLN  COMPARISON:  Chest CT 04/21/2013. CT of the abdomen and pelvis 04/28/2013.  FINDINGS: CT CHEST FINDINGS  Mediastinum: Compared to the prior study, the previously noted mediastinal adenopathy has significantly improved. High right paratracheal lymph node has resolved. Low right peritracheal lymph nodes significantly decreased in size, currently measuring 2.3 x 1.4 cm. Subcarinal lymph node mass significantly decreased in size, currently measuring 1.6 x 4.2 cm. Previously noted right hilar lymphadenopathy is no longer evident, although there continues to be some soft tissue thickening of the peribronchovascular interstitium in the right hilar region. Heart size is normal. There is no significant pericardial fluid, thickening or pericardial  calcification. There is atherosclerosis of the thoracic aorta, the great vessels of the mediastinum and the coronary arteries, including calcified atherosclerotic plaque in the left main, left anterior descending, left circumflex and right coronary arteries. Esophagus is unremarkable in appearance.  Lungs/Pleura: Compared to the prior study the previously noted mass centered in the perihilar aspect of the right lung, predominantly involving the right lower lobe has essentially completely resolved. Likewise, the postobstructive changes throughout the right lower lobe have resolved compared to the prior examination. There is a new linear opacity in the right lower lobe, favored to represent post infectious or inflammatory scarring. Associated with this there is a focal area retraction of the overlying pleura and a well-defined ovoid shaped 2.7 x 1.9 cm low-attenuation lesion in the posterolateral aspect of the right lung which is unusual in appearance, but favored to represent a fluid-filled cavity, potentially a bronchocele, or an unusual invagination of pleural fluid into the lung parenchyma in the setting of prior infection and scarring. This has a generally benign appearance. Trace amount of residual right-sided pleural fluid partially loculated adjacent to this lesion in the lower right hemithorax. Background of mild to moderate centrilobular and paraseptal emphysema. No new suspicious appearing pulmonary nodules or masses.  Musculoskeletal: There are no aggressive appearing lytic or  blastic lesions noted in the visualized portions of the skeleton.  CT ABDOMEN AND PELVIS FINDINGS  Abdomen/Pelvis: 2 of the previously noted sub cm low-attenuation lesions in the left lobe of the liver have resolved compared to the prior study. One of the previously noted subcentimeter low attenuation lesions in the right lobe of the liver is unchanged and too small to characterize (image 63 of series 2). No new liver lesions are  noted. The appearance of the gallbladder, pancreas, spleen and bilateral adrenal glands is unremarkable. Several sub cm low-attenuation lesions are noted in the kidneys bilaterally, and are unchanged compared to the prior study, favored to represent small cysts, although too small to definitively characterize. 1.6 cm exophytic simple cyst in the interpolar region of the left kidney is unchanged. Extensive atherosclerosis throughout the abdominal and pelvic vasculature, without evidence of aneurysm or dissection. No significant volume of ascites. No pneumoperitoneum. No pathologic distention of small bowel. No definite lymphadenopathy identified within the abdomen or pelvis. Prostate gland and urinary bladder are unremarkable in appearance.  Musculoskeletal: There are no aggressive appearing lytic or blastic lesions noted in the visualized portions of the skeleton.  IMPRESSION: 1. Today's study demonstrates a positive response to therapy with progression of the previously noted right hilar mass, and decreased size of mediastinal lymphadenopathy. 2. No findings to suggest new metastatic disease in the abdomen or pelvis. 3. Interval resolution of the majority of the postobstructive changes in the right lower lobe. There is what appears to be an unusual area of scarring in the right lower lobe on today's examination, with a small fluid attenuation component which may either represent a bronchocele or an invaginated pleural fluid collection into an area of scarring within the right lower lobe. Attention on followup studies is recommended. 4. Atherosclerosis, including left main and 3 vessel coronary artery disease. Assessment for potential risk factor modification, dietary therapy or pharmacologic therapy may be warranted, if clinically indicated. 5. Additional incidental findings, as above.   Electronically Signed   By: Vinnie Langton M.D.   On: 06/15/2013 16:49   Dg Ugi W/high Density W/kub  06/21/2013   CLINICAL  DATA:  History of small cell lung cancer. Epigastric pain and loss of appetite  EXAM: UPPER GI SERIES WITH KUB  TECHNIQUE: After obtaining a scout radiograph a routine upper GI series was performed using thin and high density barium.  FLUOROSCOPY TIME:  1 min 35 seconds  COMPARISON:  None.  FINDINGS: The esophagus is patent. There is no stricture or mass identified. The stomach, duodenal bulb and sweep have a normal configuration and appearance. The patient ingested a 13 mm barium tablet which promptly passed through the esophagus and into the stomach. The motility of the esophagus appears normal. No reflux identified.  IMPRESSION: Normal exam.   Electronically Signed   By: Kerby Moors M.D.   On: 06/21/2013 17:48    ASSESSMENT: Ricky Villanueva 63 y.o. male with a history of Small cell carcinoma of lung  Anorexia  Acute pulmonary embolism  Lung mass  Protein-calorie malnutrition, severe   PLAN:  1. 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 Pablo Ledger completed whole brain XRT on 03/25. We started chemotherapy on 03/25 and he completed Cycle #1 On cisplatin 80 mg/m2 on day 1, etoposide 100 mg/m2 days 1,2,3. He had toxicity related to diminished hearing and hospitalization.  He has been evaluated by Dr. Constance Holster who also recommends hearing aides.    --  Given his recent hospitalization as detailed above and now complains of hearing fullness and intermittent tinnitus, we discontinued cisplatin and substitute with carboplatin AUC 3. In addition, we will decrease etoposide dose by 25% due to profound neutropenia. We will also continue neulasta of Day#4 of his chemotherapy each cycle due to febrile neutropenia. Plan continue chemotherapy today for his Cycle #2, day #1 of Carboplatin plus etoposide.  He still reports gastritis/esophagitis and anorexia.   --We will obtain a CT of abdomen and pelvis and chest given above symptoms to assess response to therapy following  2 cycles of therapy.  We will schedule these of 08/01/2013 along with his labs for consideration of cycle #3, day #1 of carboplatin plus etoposide.   2. Gastritis/esophagitis.  --Changed to zegerid OTC with mild improvement in his symptoms.  Seen by GI with a normal barium swallow.   3. Anorexia.  --Likely secondary to #1. Prescribed Marinol 2.5 mg bid but patient declines this presently.   4. PE (04/21/2013).  --CTA of chest noted above consistent with Nonocclusive left upper lobe, right lower lobe pulmonary emboli. Right heart strain. Continue xarelto 20 mg daily.   5. Anemia secondary to #1.  --Continue Xalreto.   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 --We DISCONTINUED cisplatin and now patient is on carboplatin.  He has been fitted and received his hearing aids.   7. Dehydration and hypotension.  --We will continue Normal saline twice weekly on Tuesdays and Thursdays.  He will be give one liter of normal saline at 500 cc/hr for 2 hours.   8. Emphysema  --Continue spiriva hanihaler 18 mcg inhalation daily. Oxygen 2 liters by [place] prn for moderate exertion.   9. Follow-up.  --Patient will follow up on 06/10 for consideration of chemotherapy with carbo plus etoposide cycle #3, day #1. He will have restaging scans including CT of C/A/P on 06/08 and MRI of Brain on 08/30/2013.  He will require normal saline 1 L over two hours every Tuesday and Thursday.   All questions were answered. The patient knows to call the clinic with any problems, questions or concerns. We can certainly see the patient much sooner if necessary.  I spent 15 minutes counseling the patient face to face. The total time spent in the appointment was 25 minutes.    Concha Norway, MD 07/13/2013 1:54 PM

## 2013-07-13 NOTE — Patient Instructions (Signed)
[facility] Discharge Instructions for Patients Receiving Chemotherapy  Today you received the following chemotherapy agents: Carboplatin, Etoposide  To help prevent nausea and vomiting after your treatment, we encourage you to take your nausea medication: Compazine 10 mg every 6 hrs as needed.    If you develop nausea and vomiting that is not controlled by your nausea medication, call the clinic.   BELOW ARE SYMPTOMS THAT SHOULD BE REPORTED IMMEDIATELY:  *FEVER GREATER THAN 100.5 F  *CHILLS WITH OR WITHOUT FEVER  NAUSEA AND VOMITING THAT IS NOT CONTROLLED WITH YOUR NAUSEA MEDICATION  *UNUSUAL SHORTNESS OF BREATH  *UNUSUAL BRUISING OR BLEEDING  TENDERNESS IN MOUTH AND THROAT WITH OR WITHOUT PRESENCE OF ULCERS  *URINARY PROBLEMS  *BOWEL PROBLEMS  UNUSUAL RASH Items with * indicate a potential emergency and should be followed up as soon as possible.  Feel free to call the clinic you have any questions or concerns. The clinic phone number is [phone].

## 2013-07-14 ENCOUNTER — Ambulatory Visit (HOSPITAL_BASED_OUTPATIENT_CLINIC_OR_DEPARTMENT_OTHER): Payer: BC Managed Care – PPO

## 2013-07-14 ENCOUNTER — Ambulatory Visit: Payer: BC Managed Care – PPO | Admitting: Nutrition

## 2013-07-14 ENCOUNTER — Other Ambulatory Visit: Payer: Self-pay | Admitting: Internal Medicine

## 2013-07-14 VITALS — BP 94/64 | HR 82 | Temp 98.0°F | Resp 18

## 2013-07-14 DIAGNOSIS — C3490 Malignant neoplasm of unspecified part of unspecified bronchus or lung: Secondary | ICD-10-CM

## 2013-07-14 DIAGNOSIS — C7A1 Malignant poorly differentiated neuroendocrine tumors: Secondary | ICD-10-CM

## 2013-07-14 DIAGNOSIS — Z5111 Encounter for antineoplastic chemotherapy: Secondary | ICD-10-CM

## 2013-07-14 DIAGNOSIS — C7B8 Other secondary neuroendocrine tumors: Secondary | ICD-10-CM

## 2013-07-14 MED ORDER — SODIUM CHLORIDE 0.9 % IJ SOLN
10.0000 mL | INTRAMUSCULAR | Status: DC | PRN
  Filled 2013-07-14: qty 10

## 2013-07-14 MED ORDER — HEPARIN SOD (PORK) LOCK FLUSH 100 UNIT/ML IV SOLN
500.0000 [IU] | Freq: Once | INTRAVENOUS | Status: DC | PRN
  Filled 2013-07-14: qty 5

## 2013-07-14 MED ORDER — SODIUM CHLORIDE 0.9 % IV SOLN
75.0000 mg/m2 | Freq: Once | INTRAVENOUS | Status: AC
  Administered 2013-07-14: 120 mg via INTRAVENOUS
  Filled 2013-07-14: qty 6

## 2013-07-14 MED ORDER — SODIUM CHLORIDE 0.9 % IV SOLN
Freq: Once | INTRAVENOUS | Status: AC
  Administered 2013-07-14: 14:00:00 via INTRAVENOUS

## 2013-07-14 MED ORDER — PROCHLORPERAZINE MALEATE 10 MG PO TABS
ORAL_TABLET | ORAL | Status: AC
  Filled 2013-07-14: qty 1

## 2013-07-14 MED ORDER — PROCHLORPERAZINE MALEATE 10 MG PO TABS
10.0000 mg | ORAL_TABLET | Freq: Once | ORAL | Status: AC
  Administered 2013-07-14: 10 mg via ORAL

## 2013-07-14 NOTE — Progress Notes (Signed)
Brief nutrition followup completed with patient and family in the chemotherapy area.  Patient and family report patient is doing much better.  Weight is up 1 pound from last week.  Patient reports he feels like he is eating a little bit more but is still "hard".  Noted patient's hospitalization and general decline in weight from last visit.  Weight documented as 103 pounds today.  Weight on March 24 was 119.9 pounds.  Patient has no questions or concerns.  No nutrition needs.  Appreciated RD stopping by to check on him.

## 2013-07-15 ENCOUNTER — Telehealth: Payer: Self-pay | Admitting: Medical Oncology

## 2013-07-15 ENCOUNTER — Other Ambulatory Visit: Payer: Self-pay | Admitting: Medical Oncology

## 2013-07-15 ENCOUNTER — Telehealth: Payer: Self-pay | Admitting: Internal Medicine

## 2013-07-15 ENCOUNTER — Ambulatory Visit (HOSPITAL_BASED_OUTPATIENT_CLINIC_OR_DEPARTMENT_OTHER): Payer: BC Managed Care – PPO

## 2013-07-15 VITALS — BP 91/66 | HR 83 | Temp 97.1°F

## 2013-07-15 DIAGNOSIS — C7B8 Other secondary neuroendocrine tumors: Secondary | ICD-10-CM

## 2013-07-15 DIAGNOSIS — Z5111 Encounter for antineoplastic chemotherapy: Secondary | ICD-10-CM

## 2013-07-15 DIAGNOSIS — C7A1 Malignant poorly differentiated neuroendocrine tumors: Secondary | ICD-10-CM

## 2013-07-15 DIAGNOSIS — C3490 Malignant neoplasm of unspecified part of unspecified bronchus or lung: Secondary | ICD-10-CM

## 2013-07-15 MED ORDER — ONDANSETRON 8 MG/NS 50 ML IVPB
INTRAVENOUS | Status: AC
  Filled 2013-07-15: qty 8

## 2013-07-15 MED ORDER — SODIUM CHLORIDE 0.9 % IV SOLN
500.0000 mL | Freq: Once | INTRAVENOUS | Status: AC
  Administered 2013-07-15: 15:00:00 via INTRAVENOUS

## 2013-07-15 MED ORDER — PROCHLORPERAZINE MALEATE 10 MG PO TABS
ORAL_TABLET | ORAL | Status: AC
Start: 2013-07-15 — End: 2013-07-15
  Filled 2013-07-15: qty 1

## 2013-07-15 MED ORDER — SODIUM CHLORIDE 0.9 % IV SOLN: Freq: Once | INTRAVENOUS | Status: DC

## 2013-07-15 MED ORDER — SODIUM CHLORIDE 0.9 % IV SOLN
75.0000 mg/m2 | Freq: Once | INTRAVENOUS | Status: AC
  Administered 2013-07-15: 120 mg via INTRAVENOUS
  Filled 2013-07-15: qty 6

## 2013-07-15 MED ORDER — PROCHLORPERAZINE MALEATE 10 MG PO TABS
10.0000 mg | ORAL_TABLET | Freq: Once | ORAL | Status: AC
  Administered 2013-07-15: 10 mg via ORAL

## 2013-07-15 NOTE — Telephone Encounter (Signed)
added additional appts for june. not able to added fluids for 5/26 and 5/28 no avail. per maggie fluids for wk of 5/26 will need to be done @ [facility]. staff message forwarded to chism/desk nurse and vm left for nurse. pt given new schedule while in inf.

## 2013-07-15 NOTE — Telephone Encounter (Signed)
, °

## 2013-07-15 NOTE — Patient Instructions (Signed)
[facility] Discharge Instructions for Patients Receiving Chemotherapy  Today you received the following chemotherapy agents Etoposide  To help prevent nausea and vomiting after your treatment, we encourage you to take your nausea medication as prescribed.   If you develop nausea and vomiting that is not controlled by your nausea medication, call the clinic.   BELOW ARE SYMPTOMS THAT SHOULD BE REPORTED IMMEDIATELY:  *FEVER GREATER THAN 100.5 F  *CHILLS WITH OR WITHOUT FEVER  NAUSEA AND VOMITING THAT IS NOT CONTROLLED WITH YOUR NAUSEA MEDICATION  *UNUSUAL SHORTNESS OF BREATH  *UNUSUAL BRUISING OR BLEEDING  TENDERNESS IN MOUTH AND THROAT WITH OR WITHOUT PRESENCE OF ULCERS  *URINARY PROBLEMS  *BOWEL PROBLEMS  UNUSUAL RASH Items with * indicate a potential emergency and should be followed up as soon as possible.  Feel free to call the clinic you have any questions or concerns. The clinic phone number is [phone].

## 2013-07-15 NOTE — Telephone Encounter (Signed)
I called [facility] to set up IV fluids for pt 5/26 and 5/28. They gave me appointment times for 9 am. They will not be on pt's calender because the secretary is out today. I spoke with pt and daughter to inform them.

## 2013-07-16 ENCOUNTER — Ambulatory Visit: Payer: BC Managed Care – PPO

## 2013-07-16 ENCOUNTER — Ambulatory Visit (HOSPITAL_BASED_OUTPATIENT_CLINIC_OR_DEPARTMENT_OTHER): Payer: BC Managed Care – PPO

## 2013-07-16 VITALS — BP 93/72 | HR 81 | Temp 98.0°F

## 2013-07-16 DIAGNOSIS — C7A1 Malignant poorly differentiated neuroendocrine tumors: Secondary | ICD-10-CM

## 2013-07-16 DIAGNOSIS — C3490 Malignant neoplasm of unspecified part of unspecified bronchus or lung: Secondary | ICD-10-CM

## 2013-07-16 DIAGNOSIS — C7B8 Other secondary neuroendocrine tumors: Secondary | ICD-10-CM

## 2013-07-16 DIAGNOSIS — E860 Dehydration: Secondary | ICD-10-CM

## 2013-07-16 DIAGNOSIS — Z5189 Encounter for other specified aftercare: Secondary | ICD-10-CM

## 2013-07-16 MED ORDER — PEGFILGRASTIM INJECTION 6 MG/0.6ML
6.0000 mg | Freq: Once | SUBCUTANEOUS | Status: AC
  Administered 2013-07-16: 6 mg via SUBCUTANEOUS

## 2013-07-16 MED ORDER — OXYCODONE-ACETAMINOPHEN 5-325 MG PO TABS
2.0000 | ORAL_TABLET | Freq: Once | ORAL | Status: AC
  Administered 2013-07-16: 2 via ORAL

## 2013-07-16 MED ORDER — SODIUM CHLORIDE 0.9 % IV SOLN
Freq: Once | INTRAVENOUS | Status: AC
  Administered 2013-07-16: 10:00:00 via INTRAVENOUS

## 2013-07-16 MED ORDER — OXYCODONE-ACETAMINOPHEN 5-325 MG PO TABS
ORAL_TABLET | ORAL | Status: AC
  Filled 2013-07-16: qty 2

## 2013-07-16 NOTE — Patient Instructions (Signed)
Pegfilgrastim injection What is this medicine? PEGFILGRASTIM (peg fil GRA stim) helps the body make more white blood cells. It is used to prevent infection in people with low amounts of white blood cells following cancer treatment. This medicine may be used for other purposes; ask your health care provider or pharmacist if you have questions. COMMON BRAND NAME(S): Neulasta What should I tell my health care provider before I take this medicine? They need to know if you have any of these conditions: -sickle cell disease -an unusual or allergic reaction to pegfilgrastim, filgrastim, E.coli protein, other medicines, foods, dyes, or preservatives -pregnant or trying to get pregnant -breast-feeding How should I use this medicine? This medicine is for injection under the skin. It is usually given by a health care professional in a hospital or clinic setting. If you get this medicine at home, you will be taught how to prepare and give this medicine. Do not shake this medicine. Use exactly as directed. Take your medicine at regular intervals. Do not take your medicine more often than directed. It is important that you put your used needles and syringes in a special sharps container. Do not put them in a trash can. If you do not have a sharps container, call your pharmacist or healthcare provider to get one. Talk to your pediatrician regarding the use of this medicine in children. While this drug may be prescribed for children who weigh more than 45 kg for selected conditions, precautions do apply Overdosage: If you think you have taken too much of this medicine contact a poison control center or emergency room at once. NOTE: This medicine is only for you. Do not share this medicine with others. What if I miss a dose? If you miss a dose, take it as soon as you can. If it is almost time for your next dose, take only that dose. Do not take double or extra doses. What may interact with this  medicine? -lithium -medicines for growth therapy This list may not describe all possible interactions. Give your health care provider a list of all the medicines, herbs, non-prescription drugs, or dietary supplements you use. Also tell them if you smoke, drink alcohol, or use illegal drugs. Some items may interact with your medicine. What should I watch for while using this medicine? Visit your doctor for regular check ups. You will need important blood work done while you are taking this medicine. What side effects may I notice from receiving this medicine? Side effects that you should report to your doctor or health care professional as soon as possible: -allergic reactions like skin rash, itching or hives, swelling of the face, lips, or tongue -breathing problems -fever -pain, redness, or swelling where injected -shoulder pain -stomach or side pain Side effects that usually do not require medical attention (report to your doctor or health care professional if they continue or are bothersome): -aches, pains -headache -loss of appetite -nausea, vomiting -unusually tired This list may not describe all possible side effects. Call your doctor for medical advice about side effects. You may report side effects to FDA at 1-800-FDA-1088. Where should I keep my medicine? Keep out of the reach of children. Store in a refrigerator between 2 and 8 degrees C (36 and 46 degrees F). Do not freeze. Keep in carton to protect from light. Throw away this medicine if it is left out of the refrigerator for more than 48 hours. Throw away any unused medicine after the expiration date. NOTE: This sheet is  a summary. It may not cover all possible information. If you have questions about this medicine, talk to your doctor, pharmacist, or health care provider.  2014, Elsevier/Gold Standard. (2007-09-13 15:41:44) Dehydration, Adult Dehydration is when you lose more fluids from the body than you take in. Vital  organs like the kidneys, brain, and heart cannot function without a proper amount of fluids and salt. Any loss of fluids from the body can cause dehydration.  CAUSES   Vomiting.  Diarrhea.  Excessive sweating.  Excessive urine output.  Fever. SYMPTOMS  Mild dehydration  Thirst.  Dry lips.  Slightly dry mouth. Moderate dehydration  Very dry mouth.  Sunken eyes.  Skin does not bounce back quickly when lightly pinched and released.  Dark urine and decreased urine production.  Decreased tear production.  Headache. Severe dehydration  Very dry mouth.  Extreme thirst.  Rapid, weak pulse (more than 100 beats per minute at rest).  Cold hands and feet.  Not able to sweat in spite of heat and temperature.  Rapid breathing.  Blue lips.  Confusion and lethargy.  Difficulty being awakened.  Minimal urine production.  No tears. DIAGNOSIS  Your caregiver will diagnose dehydration based on your symptoms and your exam. Blood and urine tests will help confirm the diagnosis. The diagnostic evaluation should also identify the cause of dehydration. TREATMENT  Treatment of mild or moderate dehydration can often be done at home by increasing the amount of fluids that you drink. It is best to drink small amounts of fluid more often. Drinking too much at one time can make vomiting worse. Refer to the home care instructions below. Severe dehydration needs to be treated at the hospital where you will probably be given intravenous (IV) fluids that contain water and electrolytes. HOME CARE INSTRUCTIONS   Ask your caregiver about specific rehydration instructions.  Drink enough fluids to keep your urine clear or pale yellow.  Drink small amounts frequently if you have nausea and vomiting.  Eat as you normally do.  Avoid:  Foods or drinks high in sugar.  Carbonated drinks.  Juice.  Extremely hot or cold fluids.  Drinks with caffeine.  Fatty, greasy  foods.  Alcohol.  Tobacco.  Overeating.  Gelatin desserts.  Wash your hands well to avoid spreading bacteria and viruses.  Only take over-the-counter or prescription medicines for pain, discomfort, or fever as directed by your caregiver.  Ask your caregiver if you should continue all prescribed and over-the-counter medicines.  Keep all follow-up appointments with your caregiver. SEEK MEDICAL CARE IF:  You have abdominal pain and it increases or stays in one area (localizes).  You have a rash, stiff neck, or severe headache.  You are irritable, sleepy, or difficult to awaken.  You are weak, dizzy, or extremely thirsty. SEEK IMMEDIATE MEDICAL CARE IF:   You are unable to keep fluids down or you get worse despite treatment.  You have frequent episodes of vomiting or diarrhea.  You have blood or green matter (bile) in your vomit.  You have blood in your stool or your stool looks black and tarry.  You have not urinated in 6 to 8 hours, or you have only urinated a small amount of very dark urine.  You have a fever.  You faint. MAKE SURE YOU:   Understand these instructions.  Will watch your condition.  Will get help right away if you are not doing well or get worse. Document Released: 02/10/2005 Document Revised: 05/05/2011 Document Reviewed: 09/30/2010 Exit[employer] Patient  Information 2014 [employer].  

## 2013-07-16 NOTE — Progress Notes (Signed)
1105- Pt complaining of pain in his upper mid abdomen and around to his back. He states pain is 8. He takes morphine and oxycodone at home for pain. He took morphine 30mg  ER at 845 am. I called Dr. Marin Olp- on call MD and he gave orders for percocet. 1140am- Pt states pain is improving around a 5.

## 2013-07-19 ENCOUNTER — Telehealth: Payer: Self-pay

## 2013-07-19 NOTE — Telephone Encounter (Signed)
Pt cancelled today's fluid appt, felt OK. Wanted to keep Thursday's appt for now. Let [facility] know.

## 2013-07-21 ENCOUNTER — Non-Acute Institutional Stay (HOSPITAL_COMMUNITY)
Admission: AD | Admit: 2013-07-21 | Discharge: 2013-07-21 | Disposition: A | Discharge status: Home / Self Care | Payer: BC Managed Care – PPO | Source: Ambulatory Visit | Attending: Internal Medicine | Admitting: Internal Medicine

## 2013-07-21 ENCOUNTER — Other Ambulatory Visit: Payer: Self-pay | Admitting: Medical Oncology

## 2013-07-21 DIAGNOSIS — C3490 Malignant neoplasm of unspecified part of unspecified bronchus or lung: Secondary | ICD-10-CM | POA: Insufficient documentation

## 2013-07-21 DIAGNOSIS — K1230 Oral mucositis (ulcerative), unspecified: Secondary | ICD-10-CM

## 2013-07-21 MED ORDER — LORAZEPAM 0.5 MG PO TABS: 0.5000 mg | ORAL_TABLET | Freq: Three times a day (TID) | ORAL | Status: DC

## 2013-07-21 MED ORDER — SODIUM CHLORIDE 0.9 % IV SOLN
Freq: Once | INTRAVENOUS | Status: AC
  Administered 2013-07-21: 09:00:00 via INTRAVENOUS

## 2013-07-21 MED ORDER — MORPHINE SULFATE ER 30 MG PO TBCR: 30.0000 mg | EXTENDED_RELEASE_TABLET | Freq: Two times a day (BID) | ORAL | Status: DC

## 2013-07-21 MED ORDER — OXYCODONE HCL 5 MG PO TABS: 5.0000 mg | ORAL_TABLET | ORAL | Status: DC | PRN

## 2013-07-21 NOTE — Discharge Instructions (Signed)
Lung Cancer Lung cancer is an abnormal growth of cells in one or both of your lungs. These extra cells may form a mass of tissue called a growth or tumor. Tumors can be either cancerous (malignant) or not cancerous (benign).  Lung cancer is the most common cause of cancer death in men and women. There are several different types of lung cancers. Usually, lung cancer is described as either small cell lung cancer or nonsmall cell lung cancer. Other types of cancer occur in the lungs, including carcinoid and cancers spread from other organs. The types of cancer have different behavior and treatment. RISK FACTORS Smoking is the most common risk factor for developing lung cancer. Other risk factors include:  Radon gas exposure.  Asbestos and other industrial substance exposure.  Second hand tobacco smoke.  Air pollution.  Family or personal history of lung cancer.  Age older than 68 years. CAUSES  Lung cancer usually starts when the lungs are exposed to harmful chemicals. Smoking is the most common risk factor for lung cancer. When you quit smoking, your risk of lung cancer falls each year (but is never the same as a person who has never smoked).  SYMPTOMS  Lung cancer may not have any symptoms in its early stages. The symptoms can depend on the type of cancer, its location, and other factors. Symptoms can include:  Cough (either new, different, or more severe).  Shortness of breath.  Coughing up blood (hemoptysis).  Chest pain.  Hoarseness.  Swelling of the face.  Drooping eyelid.  Changes in blood tests, such as low sodium (hyponatremia), high calcium (hypercalcemia), or low blood count (anemia).  Weight loss. DIAGNOSIS  Your health care provider may suspect lung cancer based on your symptoms or based on tests obtained for other reasons. Tests or procedures used to find or confirm the presence of lung cancer may include:  Chest X-ray.  CT scan of the lungs and chest.  Blood  tests.  Taking a tissue sample (biopsy) from your lung to look for cancer cells. Your cancer will be staged to determine its severity and extent. Staging is a careful attempt to find out the size of the tumor, whether the cancer has spread, and if so, to what parts of the body. You may need to have more tests to determine the stage of your cancer. The test results will help determine what treatment plan is best for you.   Stage 0 This is the earliest stage of lung cancer. In this stage the tumor is present in only a few layers of cells and has not grown beyond the inner lining of the lungs. Stage 0 (carcinoma in situ) is considered noninvasive, meaning at this stage it is not yet capable of spreading to other regions.  Stage I The cancer is located only in the lungs and not spread to any lymph nodes.  Stage II The cancer is in the lungs and the nearby lymph nodes.  Stage III The cancer is in the lungs and the lymph nodes in the middle of the chest. This is also called locally advanced disease. This stage has two subtypes:  Stage IIIa  The cancer has spread only to lymph nodes on the same side of the chest where the cancer started.  Stage IIIb  The cancer has spread to lymph nodes on the opposite side of the chest or above the collar bone.  Stage IV This is the most advanced stage of lung cancer and is also called  advanced disease. This stage describes when the cancer has spread to both lungs, the fluid in the area around the lungs, or to another body part. Your health care provider may tell you the detailed stage of your cancer, which includes both a number and a letter.  TREATMENT  Depending on the type and stage, lung cancer may be treated with surgery, radiation therapy, chemotherapy, or targeted therapy. Some people have a combination of these therapies. Your treatment plan will be developed by your health care team.  Brenas not smoke.  Only take over-the-counter or  prescription medicines for pain, discomfort, or fever as directed by your health care provider.  Maintain a healthy diet.  Consider joining a support group. This may help you learn to cope with the stress of having lung cancer.  Seek advice to help you manage treatment side effects.  Keep all follow-up appointments as directed by your health care provider.  Inform your cancer specialist if you are admitted to the hospital. [place] IF:   You are losing weight without trying.  You have a persistent cough.  You feel short of breath.  You tire easily. SEEK IMMEDIATE MEDICAL CARE IF:   You cough up clotted blood or bright red blood.  Your pain is not manageable or controlled by medicine.  You develop new difficulty breathing or chest pain.  You develop swelling in one or both ankles or legs, or swelling in your face or neck.  You develop headache or confusion. Document Released: 05/19/2000 Document Revised: 12/01/2012 Document Reviewed: 09/29/2012 [facility] Patient Information 2014 [place], [place].

## 2013-07-21 NOTE — H&P (Signed)
Intravenous fluids only.

## 2013-07-21 NOTE — Progress Notes (Addendum)
Intravenous fluids only.

## 2013-07-21 NOTE — Discharge Summary (Signed)
Intravenous fluids only.

## 2013-07-21 NOTE — Procedures (Signed)
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    Condition at Discharge: Patient tolerated Well, No complaints. Patient in no apparent distress. Patient left day hospital with belongings ambulatory escorted by wife.    Wendie Simmer, RN  [facility]

## 2013-07-26 ENCOUNTER — Ambulatory Visit (HOSPITAL_BASED_OUTPATIENT_CLINIC_OR_DEPARTMENT_OTHER): Payer: BC Managed Care – PPO

## 2013-07-26 VITALS — BP 93/56 | HR 78 | Temp 97.8°F | Resp 20

## 2013-07-26 DIAGNOSIS — C7A1 Malignant poorly differentiated neuroendocrine tumors: Secondary | ICD-10-CM

## 2013-07-26 DIAGNOSIS — C3490 Malignant neoplasm of unspecified part of unspecified bronchus or lung: Secondary | ICD-10-CM

## 2013-07-26 DIAGNOSIS — E860 Dehydration: Secondary | ICD-10-CM

## 2013-07-26 MED ORDER — SODIUM CHLORIDE 0.9 % IV SOLN
500.0000 mL | Freq: Once | INTRAVENOUS | Status: AC
  Administered 2013-07-26: 500 mL via INTRAVENOUS

## 2013-07-26 NOTE — Patient Instructions (Signed)
Dehydration, Adult  Dehydration means your body does not have as much fluid as it needs. Your kidneys, brain, and heart will not work properly without the right amount of fluids and salt.   HOME CARE   Ask your doctor how to replace body fluid losses (rehydrate).   Drink enough fluids to keep your pee (urine) clear or pale yellow.   Drink small amounts of fluids often if you feel sick to your stomach (nauseous) or throw up (vomit).   Eat like you normally do.   Avoid:   Foods or drinks high in sugar.   Bubbly (carbonated) drinks.   Juice.   Very hot or cold fluids.   Drinks with caffeine.   Fatty, greasy foods.   Alcohol.   Tobacco.   Eating too much.   Gelatin desserts.   Wash your hands to avoid spreading germs (bacteria, viruses).   Only take medicine as told by your doctor.   Keep all doctor visits as told.  GET HELP RIGHT AWAY IF:    You cannot drink something without throwing up.   You get worse even with treatment.   Your vomit has blood in it or looks greenish.   Your poop (stool) has blood in it or looks black and tarry.   You have not peed in 6 to 8 hours.   You pee a small amount of very dark pee.   You have a fever.   You pass out (faint).   You have belly (abdominal) pain that gets worse or stays in one spot (localizes).   You have a rash, stiff neck, or bad headache.   You get easily annoyed, sleepy, or are hard to wake up.   You feel weak, dizzy, or very thirsty.  MAKE SURE YOU:    Understand these instructions.   Will watch your condition.   Will get help right away if you are not doing well or get worse.  Document Released: 12/07/2008 Document Revised: 05/05/2011 Document Reviewed: 09/30/2010  [employer] Patient Information 2014 [employer].

## 2013-07-27 ENCOUNTER — Telehealth: Payer: Self-pay

## 2013-07-27 NOTE — Telephone Encounter (Signed)
Calling [name] back, we changed infusion appt to 08/01/13 at 1215, prior to his CT scan which is at 3 pm.

## 2013-07-28 ENCOUNTER — Ambulatory Visit (HOSPITAL_BASED_OUTPATIENT_CLINIC_OR_DEPARTMENT_OTHER): Payer: BC Managed Care – PPO

## 2013-07-28 ENCOUNTER — Other Ambulatory Visit: Payer: Self-pay | Admitting: Internal Medicine

## 2013-07-28 VITALS — BP 88/59 | HR 74 | Temp 97.1°F | Resp 20

## 2013-07-28 DIAGNOSIS — Z5189 Encounter for other specified aftercare: Secondary | ICD-10-CM

## 2013-07-28 DIAGNOSIS — C7B8 Other secondary neuroendocrine tumors: Secondary | ICD-10-CM

## 2013-07-28 DIAGNOSIS — C3490 Malignant neoplasm of unspecified part of unspecified bronchus or lung: Secondary | ICD-10-CM

## 2013-07-28 DIAGNOSIS — C7A1 Malignant poorly differentiated neuroendocrine tumors: Secondary | ICD-10-CM

## 2013-07-28 MED ORDER — LEVOFLOXACIN 500 MG PO TABS: 500.0000 mg | ORAL_TABLET | Freq: Every day | ORAL | Status: DC

## 2013-07-28 MED ORDER — SODIUM CHLORIDE 0.9 % IV SOLN
Freq: Once | INTRAVENOUS | Status: AC
  Administered 2013-07-28: 14:00:00 via INTRAVENOUS

## 2013-07-28 NOTE — Patient Instructions (Signed)
Dehydration, Adult Dehydration is when you lose more fluids from the body than you take in. Vital organs like the kidneys, brain, and heart cannot function without a proper amount of fluids and salt. Any loss of fluids from the body can cause dehydration.  CAUSES   Vomiting.  Diarrhea.  Excessive sweating.  Excessive urine output.  Fever. SYMPTOMS  Mild dehydration  Thirst.  Dry lips.  Slightly dry mouth. Moderate dehydration  Very dry mouth.  Sunken eyes.  Skin does not bounce back quickly when lightly pinched and released.  Dark urine and decreased urine production.  Decreased tear production.  Headache. Severe dehydration  Very dry mouth.  Extreme thirst.  Rapid, weak pulse (more than 100 beats per minute at rest).  Cold hands and feet.  Not able to sweat in spite of heat and temperature.  Rapid breathing.  Blue lips.  Confusion and lethargy.  Difficulty being awakened.  Minimal urine production.  No tears. DIAGNOSIS  Your caregiver will diagnose dehydration based on your symptoms and your exam. Blood and urine tests will help confirm the diagnosis. The diagnostic evaluation should also identify the cause of dehydration. TREATMENT  Treatment of mild or moderate dehydration can often be done at home by increasing the amount of fluids that you drink. It is best to drink small amounts of fluid more often. Drinking too much at one time can make vomiting worse. Refer to the home care instructions below. Severe dehydration needs to be treated at the hospital where you will probably be given intravenous (IV) fluids that contain water and electrolytes. HOME CARE INSTRUCTIONS   Ask your caregiver about specific rehydration instructions.  Drink enough fluids to keep your urine clear or pale yellow.  Drink small amounts frequently if you have nausea and vomiting.  Eat as you normally do.  Avoid:  Foods or drinks high in sugar.  Carbonated  drinks.  Juice.  Extremely hot or cold fluids.  Drinks with caffeine.  Fatty, greasy foods.  Alcohol.  Tobacco.  Overeating.  Gelatin desserts.  Wash your hands well to avoid spreading bacteria and viruses.  Only take over-the-counter or prescription medicines for pain, discomfort, or fever as directed by your caregiver.  Ask your caregiver if you should continue all prescribed and over-the-counter medicines.  Keep all follow-up appointments with your caregiver. SEEK MEDICAL CARE IF:  You have abdominal pain and it increases or stays in one area (localizes).  You have a rash, stiff neck, or severe headache.  You are irritable, sleepy, or difficult to awaken.  You are weak, dizzy, or extremely thirsty. SEEK IMMEDIATE MEDICAL CARE IF:   You are unable to keep fluids down or you get worse despite treatment.  You have frequent episodes of vomiting or diarrhea.  You have blood or green matter (bile) in your vomit.  You have blood in your stool or your stool looks black and tarry.  You have not urinated in 6 to 8 hours, or you have only urinated a small amount of very dark urine.  You have a fever.  You faint. MAKE SURE YOU:   Understand these instructions.  Will watch your condition.  Will get help right away if you are not doing well or get worse. Document Released: 02/10/2005 Document Revised: 05/05/2011 Document Reviewed: 09/30/2010 [employer] Patient Information 2014 [employer].  

## 2013-07-28 NOTE — Progress Notes (Signed)
Pt denies any light-headedness or dizziness. Pt ambulatory with wife at side.

## 2013-07-30 ENCOUNTER — Other Ambulatory Visit: Payer: Self-pay | Admitting: *Deleted

## 2013-08-01 ENCOUNTER — Ambulatory Visit (HOSPITAL_COMMUNITY)
Admission: RE | Admit: 2013-08-01 | Discharge: 2013-08-01 | Disposition: A | Discharge status: Home / Self Care | Payer: BC Managed Care – PPO | Source: Ambulatory Visit | Attending: Internal Medicine | Admitting: Internal Medicine

## 2013-08-01 ENCOUNTER — Ambulatory Visit: Payer: BC Managed Care – PPO

## 2013-08-01 ENCOUNTER — Encounter (HOSPITAL_COMMUNITY): Payer: Self-pay

## 2013-08-01 ENCOUNTER — Telehealth: Payer: Self-pay | Admitting: Medical Oncology

## 2013-08-01 ENCOUNTER — Emergency Department (HOSPITAL_COMMUNITY): Payer: BC Managed Care – PPO

## 2013-08-01 ENCOUNTER — Emergency Department (HOSPITAL_COMMUNITY)
Admission: EM | Admit: 2013-08-01 | Discharge: 2013-08-01 | Disposition: A | Discharge status: Home / Self Care | Payer: BC Managed Care – PPO | Attending: Emergency Medicine | Admitting: Emergency Medicine

## 2013-08-01 ENCOUNTER — Encounter (HOSPITAL_COMMUNITY): Payer: Self-pay | Admitting: Emergency Medicine

## 2013-08-01 DIAGNOSIS — J4489 Other specified chronic obstructive pulmonary disease: Secondary | ICD-10-CM | POA: Insufficient documentation

## 2013-08-01 DIAGNOSIS — Z859 Personal history of malignant neoplasm, unspecified: Secondary | ICD-10-CM | POA: Insufficient documentation

## 2013-08-01 DIAGNOSIS — Z7901 Long term (current) use of anticoagulants: Secondary | ICD-10-CM | POA: Insufficient documentation

## 2013-08-01 DIAGNOSIS — I10 Essential (primary) hypertension: Secondary | ICD-10-CM | POA: Insufficient documentation

## 2013-08-01 DIAGNOSIS — Z923 Personal history of irradiation: Secondary | ICD-10-CM | POA: Insufficient documentation

## 2013-08-01 DIAGNOSIS — M47814 Spondylosis without myelopathy or radiculopathy, thoracic region: Secondary | ICD-10-CM | POA: Insufficient documentation

## 2013-08-01 DIAGNOSIS — C3490 Malignant neoplasm of unspecified part of unspecified bronchus or lung: Secondary | ICD-10-CM | POA: Insufficient documentation

## 2013-08-01 DIAGNOSIS — M5137 Other intervertebral disc degeneration, lumbosacral region: Secondary | ICD-10-CM | POA: Insufficient documentation

## 2013-08-01 DIAGNOSIS — N281 Cyst of kidney, acquired: Secondary | ICD-10-CM | POA: Insufficient documentation

## 2013-08-01 DIAGNOSIS — K219 Gastro-esophageal reflux disease without esophagitis: Secondary | ICD-10-CM | POA: Insufficient documentation

## 2013-08-01 DIAGNOSIS — J438 Other emphysema: Secondary | ICD-10-CM | POA: Insufficient documentation

## 2013-08-01 DIAGNOSIS — I700 Atherosclerosis of aorta: Secondary | ICD-10-CM | POA: Insufficient documentation

## 2013-08-01 DIAGNOSIS — E785 Hyperlipidemia, unspecified: Secondary | ICD-10-CM | POA: Insufficient documentation

## 2013-08-01 DIAGNOSIS — Z8739 Personal history of other diseases of the musculoskeletal system and connective tissue: Secondary | ICD-10-CM | POA: Insufficient documentation

## 2013-08-01 DIAGNOSIS — J449 Chronic obstructive pulmonary disease, unspecified: Secondary | ICD-10-CM | POA: Insufficient documentation

## 2013-08-01 DIAGNOSIS — M51379 Other intervertebral disc degeneration, lumbosacral region without mention of lumbar back pain or lower extremity pain: Secondary | ICD-10-CM | POA: Insufficient documentation

## 2013-08-01 DIAGNOSIS — R079 Chest pain, unspecified: Secondary | ICD-10-CM

## 2013-08-01 DIAGNOSIS — Z8701 Personal history of pneumonia (recurrent): Secondary | ICD-10-CM | POA: Insufficient documentation

## 2013-08-01 DIAGNOSIS — K7689 Other specified diseases of liver: Secondary | ICD-10-CM | POA: Insufficient documentation

## 2013-08-01 DIAGNOSIS — Z79899 Other long term (current) drug therapy: Secondary | ICD-10-CM | POA: Insufficient documentation

## 2013-08-01 DIAGNOSIS — Z87891 Personal history of nicotine dependence: Secondary | ICD-10-CM | POA: Insufficient documentation

## 2013-08-01 DIAGNOSIS — Z86711 Personal history of pulmonary embolism: Secondary | ICD-10-CM | POA: Insufficient documentation

## 2013-08-01 DIAGNOSIS — Z792 Long term (current) use of antibiotics: Secondary | ICD-10-CM | POA: Insufficient documentation

## 2013-08-01 LAB — CBC
HEMATOCRIT: 25.3 % — AB (ref 39.0–52.0)
HEMOGLOBIN: 8.4 g/dL — AB (ref 13.0–17.0)
MCH: 31.7 pg (ref 26.0–34.0)
MCHC: 33.2 g/dL (ref 30.0–36.0)
MCV: 95.5 fL (ref 78.0–100.0)
Platelets: 262 10*3/uL (ref 150–400)
RBC: 2.65 MIL/uL — AB (ref 4.22–5.81)
RDW: 19.9 % — ABNORMAL HIGH (ref 11.5–15.5)
WBC: 11.9 10*3/uL — ABNORMAL HIGH (ref 4.0–10.5)

## 2013-08-01 LAB — BASIC METABOLIC PANEL
BUN: 8 mg/dL (ref 6–23)
CALCIUM: 9.2 mg/dL (ref 8.4–10.5)
CO2: 26 meq/L (ref 19–32)
CREATININE: 0.92 mg/dL (ref 0.50–1.35)
Chloride: 98 mEq/L (ref 96–112)
GFR calc Af Amer: >90 mL/min (ref >90)
GFR calc non Af Amer: 89 mL/min — ABNORMAL LOW (ref >90)
GLUCOSE: 109 mg/dL — AB (ref 70–99)
Potassium: 4 mEq/L (ref 3.7–5.3)
Sodium: 139 mEq/L (ref 137–147)

## 2013-08-01 LAB — TROPONIN I: Troponin I: <0.3 ng/mL (ref <0.30)

## 2013-08-01 MED ORDER — SODIUM CHLORIDE 0.9 % IV BOLUS (SEPSIS)
1000.0000 mL | Freq: Once | INTRAVENOUS | Status: AC
  Administered 2013-08-01: 1000 mL via INTRAVENOUS

## 2013-08-01 MED ORDER — IOHEXOL 300 MG/ML  SOLN
50.0000 mL | Freq: Once | INTRAMUSCULAR | Status: AC | PRN
  Administered 2013-08-01: 50 mL via ORAL

## 2013-08-01 MED ORDER — IOHEXOL 300 MG/ML  SOLN
100.0000 mL | Freq: Once | INTRAMUSCULAR | Status: AC | PRN
  Administered 2013-08-01: 100 mL via INTRAVENOUS

## 2013-08-01 NOTE — Telephone Encounter (Signed)
[name]-daughter called stating that her dad is not doing too well this am. He is having a lot of chest pain. He has taken his pain medication and it has not really helped. He has the heat on 80 degrees int the house and stated he feels like he is on the edge of death. I asked her if she has taken his temperature and he declined. He is coming in for fluids today and I told her Dr. Juliann Mule can come over to the infusion room to see him. I then got a voice mail that states he temperature is going up and they are headed to ED. Dr. Juliann Mule is in agreement he needs to go to the ED.

## 2013-08-01 NOTE — ED Provider Notes (Signed)
CSN: [id number]     Arrival date & time 08/01/13  1047 History   First MD Initiated Contact with Patient 08/01/13 1113     Chief Complaint  Patient presents with  . Chest Pain  . Fever     (Consider location/radiation/quality/duration/timing/severity/associated sxs/prior Treatment) HPI  This is a 63 year old male with history of COPD, lung cancer, PE who presents with chest pain. Patient reports 24 hours of sharp left-sided chest pain. It is not worse with breathing. He denies any new shortness of breath.  He denies any fevers. Over the last week he has had a productive cough. He is currently on Levaquin as prescribed by his oncologist.  Patient states that the pain comes and goes. Currently his pain is 2/10. He's currently on Xarelto for PE. He is on his baseline home O2 requirement.   Past Medical History  Diagnosis Date  . Hyperlipidemia   . Hypertension   . GERD (gastroesophageal reflux disease)   . Respiratory failure with hypoxia 04/21/2013    secondary to pneumonia/notes 04/21/2013  . Pulmonary embolism     "got one in there now" (04/21/2013)  . COPD (chronic obstructive pulmonary disease)   . Pneumonia 2/?01/2014    "wouldn't get better; hospitalized 04/21/2013)  . Arthritis     "minor in my right hand" (04/21/2013)  . Cancer     lung ca dx'd 03/2013  . History of radiation therapy 05/03/13-05/18/13    chest & whole brain   Past Surgical History  Procedure Laterality Date  . Inguinal hernia repair Right ~ 2005  . Finger surgery Left ~ 1989    "crushed end of my middle finger off"   . Video bronchoscopy Bilateral 04/26/2013    Procedure: VIDEO BRONCHOSCOPY WITH FLUORO;  Surgeon: Wilhelmina Mcardle, MD;  Location: [facility] ENDOSCOPY;  Service: Cardiopulmonary;  Laterality: Bilateral;   Family History  Problem Relation Age of Onset  . Breast cancer Sister    History  Substance Use Topics  . Smoking status: Former Smoker -- 2.00 packs/day for 47 years    Types: Cigarettes    Quit  date: 12/25/2011  . Smokeless tobacco: Never Used  . Alcohol Use: No     Comment: 04/21/2013 "used to drink a little bit; very little; nothing in years"    Review of Systems  Constitutional: Negative.  Negative for fever.  Respiratory: Negative.  Negative for chest tightness and shortness of breath.   Cardiovascular: Positive for chest pain. Negative for leg swelling.  Gastrointestinal: Negative.  Negative for nausea, vomiting and abdominal pain.  Genitourinary: Negative.  Negative for dysuria.  Musculoskeletal: Negative for back pain.  Skin: Negative for rash.  Neurological: Negative for headaches.  All other systems reviewed and are negative.     Allergies  Augmentin  Home Medications   Prior to Admission medications   Medication Sig Start Date End Date Taking? Authorizing Provider  albuterol (PROVENTIL HFA;VENTOLIN HFA) 108 (90 BASE) MCG/ACT inhaler Inhale 2 puffs into the lungs every 6 (six) hours as needed for wheezing or shortness of breath. 04/29/13  Yes Orson Eva, MD  dexamethasone (DECADRON) 4 MG tablet Take 4 mg by mouth 2 (two) times daily with a meal. Take 2 tablets once daily the day after chemo then take 2 tablets twice daily for two days   Yes Historical Provider, MD  Docusate Sodium (DSS) 100 MG CAPS Take 200 mg by mouth See admin instructions. Daily  with meals 05/31/13  Yes Concha Norway, MD  levofloxacin (  LEVAQUIN) 500 MG tablet Take 1 tablet (500 mg total) by mouth daily. 07/28/13  Yes Concha Norway, MD  LORazepam (ATIVAN) 0.5 MG tablet Take 1 tablet (0.5 mg total) by mouth every 8 (eight) hours. 07/21/13  Yes Concha Norway, MD  morphine (MS CONTIN) 30 MG 12 hr tablet Take 1 tablet (30 mg total) by mouth every 12 (twelve) hours. 07/21/13  Yes Concha Norway, MD  nystatin (MYCOSTATIN) 100000 UNIT/ML suspension Use as directed 5 mLs in the mouth or throat as needed (after chemo). Swish and Spit 5 ml 4 times daily 06/29/13  Yes Adrena E Johnson, PA-C  Omeprazole-Sodium Bicarbonate  (ZEGERID OTC PO) Take 1 capsule by mouth every morning.    Yes Historical Provider, MD  ondansetron (ZOFRAN) 8 MG tablet Take 8 mg by mouth every 8 (eight) hours as needed for nausea or vomiting.  06/02/13  Yes Historical Provider, MD  oxyCODONE (OXY IR/ROXICODONE) 5 MG immediate release tablet Take 1 tablet (5 mg total) by mouth every 4 (four) hours as needed for severe pain. 07/21/13  Yes Concha Norway, MD  prochlorperazine (COMPAZINE) 10 MG tablet Take 10 mg by mouth every 6 (six) hours as needed for nausea or vomiting.   Yes Historical Provider, MD  rivaroxaban (XARELTO) 20 MG TABS tablet Take 20 mg by mouth daily with supper.   Yes Historical Provider, MD  tiotropium (SPIRIVA HANDIHALER) 18 MCG inhalation capsule Place 1 capsule (18 mcg total) into inhaler and inhale daily. 04/29/13  Yes David Tat, MD   BP 116/61  Pulse 77  Temp(Src) 98.1 F (36.7 C) (Oral)  Resp 17  SpO2 100% Physical Exam  Nursing note and vitals reviewed. Constitutional: He is oriented to person, place, and time. No distress.  Chronically ill-appearing  HENT:  Head: Normocephalic and atraumatic.  Mucous membranes dry  Eyes: Pupils are equal, round, and reactive to light.  Neck: Neck supple. No JVD present.  Cardiovascular: Normal rate, regular rhythm and normal heart sounds.   No murmur heard. Pulmonary/Chest: Effort normal. No respiratory distress. He has no wheezes.  Coarse breath sounds with fair air movement throughout all lung fields  Abdominal: Soft. There is no tenderness.  Musculoskeletal: He exhibits no edema.  Neurological: He is alert and oriented to person, place, and time.  Skin: Skin is warm and dry.  Psychiatric: He has a normal mood and affect.    ED Course  Procedures (including critical care time) Labs Review Labs Reviewed  CBC - Abnormal; Notable for the following:    WBC 11.9 (*)    RBC 2.65 (*)    Hemoglobin 8.4 (*)    HCT 25.3 (*)    RDW 19.9 (*)    All other components within normal  limits  BASIC METABOLIC PANEL - Abnormal; Notable for the following:    Glucose, Bld 109 (*)    GFR calc non Af Amer 89 (*)    All other components within normal limits  TROPONIN I    Imaging Review Dg Chest 2 View  08/01/2013   CLINICAL DATA:  Fever.  Chest pain.  History of lung carcinoma.  EXAM: CHEST  2 VIEW  COMPARISON:  05/27/2013  FINDINGS: Cardiac silhouette is normal in size. Normal mediastinal and hilar contours.  Lungs are hyperexpanded. No lung mass or consolidation. No evidence of edema. No pleural effusion or pneumothorax.  Bony thorax is intact.  IMPRESSION: No acute cardiopulmonary disease.  COPD.   Electronically Signed   By: Dedra Skeens.D.  On: 08/01/2013 11:46     EKG Interpretation   Date/Time:  Monday August 01 2013 11:01:41 EDT Ventricular Rate:  87 PR Interval:  122 QRS Duration: 85 QT Interval:  352 QTC Calculation: 423 R Axis:   85 Text Interpretation:  Sinus rhythm Borderline right axis deviation  Anteroseptal infarct, old Similar to prior Confirmed by HORTON  MD,  Saxon ([id number]) on 08/01/2013 11:23:56 AM      MDM   Final diagnoses:  Chest pain    Patient presents with 24 hours of waxing and waning sharp chest pain. He is nontoxic on exam. He is at his baseline as far as his O2 requirement.  He is nontoxic on exam. No wheezing or rales noted.  Patient does have a history of PEs but is currently on Xarelto.   No lower extremity swelling noted. Basic labwork was obtained. EKG is nonischemic. Chest x-ray shows no evidence of pneumonia. Patient is currently also on Levaquin.  Troponin is negative. Doubt ACS given patient's history. No evidence of wheezing or bronchitis on exam.  Patient was due to receive fluids and cancer clinic today. These were provided to the patient. He is also due for a CT scan of the chest, abdomen, and pelvis this afternoon. Patient was given oral contrast. At this time I feel patient has been adequately screened. He may have  pleurisy secondary to his known lung cancer. Doubt ACS. CT scan should show any significant increase in PE burden.  After history, exam, and medical workup I feel the patient has been appropriately medically screened and is safe for discharge home. Pertinent diagnoses were discussed with the patient. Patient was given return precautions.     Merryl Hacker, MD 08/01/13 [phone]

## 2013-08-01 NOTE — ED Notes (Addendum)
Pt c/o fever about 1 hour ago of 98.9 stating his temp is usually 97. Temp in triage is 98.1. Pt also mid to left chest pain x 1 week. Pt has lung ca

## 2013-08-01 NOTE — ED Notes (Signed)
Bed: WA07 Expected date:  Expected time:  Means of arrival:  Comments: 

## 2013-08-01 NOTE — Discharge Instructions (Signed)
Chest Pain (Nonspecific)  You were evaluated for chest pain.  Workup is reassuring at this time. 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  You should followup to have your CT scan which is scheduled this afternoon at 3 PM. He any new or worsening symptoms she should be reevaluated immediately.  It is often hard to give a specific diagnosis for the cause of chest pain. There is always a chance that your pain could be related to something serious, such as a heart attack or a blood clot in the lungs. You need to follow up with your caregiver for further evaluation. CAUSES   Heartburn.  Pneumonia or bronchitis.  Anxiety or stress.  Inflammation around your heart (pericarditis) or lung (pleuritis or pleurisy).  A blood clot in the lung.  A collapsed lung (pneumothorax). It can develop suddenly on its own (spontaneous pneumothorax) or from injury (trauma) to the chest.  Shingles infection (herpes zoster virus). The chest wall is composed of bones, muscles, and cartilage. Any of these can be the source of the pain.  The bones can be bruised by injury.  The muscles or cartilage can be strained by coughing or overwork.  The cartilage can be affected by inflammation and become sore (costochondritis). DIAGNOSIS  Lab tests or other studies, such as X-rays, electrocardiography, stress testing, or cardiac imaging, may be needed to find the cause of your pain.  TREATMENT   Treatment depends on what may be causing your chest pain. Treatment may include:  Acid blockers for heartburn.  Anti-inflammatory medicine.  Pain medicine for inflammatory conditions.  Antibiotics if an infection is present.  You may be advised to change lifestyle habits. This includes stopping smoking and avoiding alcohol, caffeine, and chocolate.  You may be advised to keep your head raised (elevated) when sleeping. This reduces the chance of acid going backward from your stomach into  your esophagus.  Most of the time, nonspecific chest pain will improve within 2 to 3 days with rest and mild pain medicine. HOME CARE INSTRUCTIONS   If antibiotics were prescribed, take your antibiotics as directed. Finish them even if you start to feel better.  For the next few days, avoid physical activities that bring on chest pain. Continue physical activities as directed.  Do not smoke.  Avoid drinking alcohol.  Only take over-the-counter or prescription medicine for pain, discomfort, or fever as directed by your caregiver.  Follow your caregiver's suggestions for further testing if your chest pain does not go away.  Keep any follow-up appointments you made. If you do not go to an appointment, you could develop lasting (chronic) problems with pain. If there is any problem keeping an appointment, you must call to reschedule. SEEK MEDICAL CARE IF:   You think you are having problems from the medicine you are taking. Read your medicine instructions carefully.  Your chest pain does not go away, even after treatment.  You develop a rash with blisters on your chest. SEEK IMMEDIATE MEDICAL CARE IF:   You have increased chest pain or pain that spreads to your arm, neck, jaw, back, or abdomen.  You develop shortness of breath, an increasing cough, or you are coughing up blood.  You have severe back or abdominal pain, feel nauseous, or vomit.  You develop severe weakness, fainting, or chills.  You have a fever. THIS IS AN EMERGENCY. Do not wait to see if the pain will go away. Get medical help at once. Call  your local emergency services (911 in [place] Do not drive yourself to the hospital. MAKE SURE YOU:   Understand these instructions.  Will watch your condition.  Will get help right away if you are not doing well or get worse. Document Released: 11/20/2004 Document Revised: 05/05/2011 Document Reviewed: 09/16/2007 [facility] Patient Information 2014 [place].

## 2013-08-02 ENCOUNTER — Ambulatory Visit: Payer: BC Managed Care – PPO

## 2013-08-02 ENCOUNTER — Other Ambulatory Visit: Payer: Self-pay | Admitting: Medical Oncology

## 2013-08-02 ENCOUNTER — Ambulatory Visit: Payer: BC Managed Care – PPO | Admitting: Family Medicine

## 2013-08-02 ENCOUNTER — Other Ambulatory Visit: Payer: Self-pay | Admitting: *Deleted

## 2013-08-02 MED ORDER — TIOTROPIUM BROMIDE MONOHYDRATE 18 MCG IN CAPS: 18.0000 ug | ORAL_CAPSULE | Freq: Every day | RESPIRATORY_TRACT | Status: DC

## 2013-08-03 ENCOUNTER — Encounter: Payer: Self-pay | Admitting: Radiation Oncology

## 2013-08-03 ENCOUNTER — Ambulatory Visit: Payer: BC Managed Care – PPO

## 2013-08-03 ENCOUNTER — Ambulatory Visit (HOSPITAL_BASED_OUTPATIENT_CLINIC_OR_DEPARTMENT_OTHER): Payer: BC Managed Care – PPO

## 2013-08-03 ENCOUNTER — Other Ambulatory Visit (HOSPITAL_BASED_OUTPATIENT_CLINIC_OR_DEPARTMENT_OTHER): Payer: BC Managed Care – PPO

## 2013-08-03 ENCOUNTER — Ambulatory Visit (HOSPITAL_BASED_OUTPATIENT_CLINIC_OR_DEPARTMENT_OTHER): Payer: BC Managed Care – PPO | Admitting: Internal Medicine

## 2013-08-03 ENCOUNTER — Ambulatory Visit (HOSPITAL_COMMUNITY)
Admission: RE | Admit: 2013-08-03 | Discharge: 2013-08-03 | Disposition: A | Discharge status: Home / Self Care | Payer: BC Managed Care – PPO | Source: Ambulatory Visit | Attending: Internal Medicine | Admitting: Internal Medicine

## 2013-08-03 VITALS — BP 81/60 | HR 96 | Temp 97.7°F | Resp 19 | Ht 67.0 in | Wt 99.1 lb

## 2013-08-03 VITALS — BP 108/68 | HR 72 | Temp 97.6°F | Resp 18

## 2013-08-03 DIAGNOSIS — H918X9 Other specified hearing loss, unspecified ear: Secondary | ICD-10-CM

## 2013-08-03 DIAGNOSIS — C3490 Malignant neoplasm of unspecified part of unspecified bronchus or lung: Secondary | ICD-10-CM

## 2013-08-03 DIAGNOSIS — C7A1 Malignant poorly differentiated neuroendocrine tumors: Secondary | ICD-10-CM

## 2013-08-03 DIAGNOSIS — T451X5A Adverse effect of antineoplastic and immunosuppressive drugs, initial encounter: Secondary | ICD-10-CM

## 2013-08-03 DIAGNOSIS — I2699 Other pulmonary embolism without acute cor pulmonale: Secondary | ICD-10-CM

## 2013-08-03 DIAGNOSIS — D649 Anemia, unspecified: Secondary | ICD-10-CM | POA: Insufficient documentation

## 2013-08-03 DIAGNOSIS — N179 Acute kidney failure, unspecified: Secondary | ICD-10-CM

## 2013-08-03 DIAGNOSIS — E860 Dehydration: Secondary | ICD-10-CM

## 2013-08-03 LAB — COMPREHENSIVE METABOLIC PANEL (CC13)
ALT: 8 U/L (ref 0–55)
ANION GAP: 10 meq/L (ref 3–11)
AST: 10 U/L (ref 5–34)
Albumin: 2.8 g/dL — ABNORMAL LOW (ref 3.5–5.0)
Alkaline Phosphatase: 78 U/L (ref 40–150)
BUN: 7.2 mg/dL (ref 7.0–26.0)
CO2: 26 meq/L (ref 22–29)
CREATININE: 0.8 mg/dL (ref 0.7–1.3)
Calcium: 8.9 mg/dL (ref 8.4–10.4)
Chloride: 104 mEq/L (ref 98–109)
GLUCOSE: 102 mg/dL (ref 70–140)
Potassium: 4 mEq/L (ref 3.5–5.1)
Sodium: 140 mEq/L (ref 136–145)
Total Bilirubin: 0.31 mg/dL (ref 0.20–1.20)
Total Protein: 5.9 g/dL — ABNORMAL LOW (ref 6.4–8.3)

## 2013-08-03 LAB — CBC WITH DIFFERENTIAL/PLATELET
BASO%: 0.2 % (ref 0.0–2.0)
Basophils Absolute: 0 10*3/uL (ref 0.0–0.1)
EOS%: 0.2 % (ref 0.0–7.0)
Eosinophils Absolute: 0 10*3/uL (ref 0.0–0.5)
HEMATOCRIT: 25.8 % — AB (ref 38.4–49.9)
HGB: 8.3 g/dL — ABNORMAL LOW (ref 13.0–17.1)
LYMPH%: 7.5 % — AB (ref 14.0–49.0)
MCH: 31.2 pg (ref 27.2–33.4)
MCHC: 32.2 g/dL (ref 32.0–36.0)
MCV: 97 fL (ref 79.3–98.0)
MONO#: 0.8 10*3/uL (ref 0.1–0.9)
MONO%: 9.1 % (ref 0.0–14.0)
NEUT#: 7.6 10*3/uL — ABNORMAL HIGH (ref 1.5–6.5)
NEUT%: 83 % — AB (ref 39.0–75.0)
PLATELETS: 351 10*3/uL (ref 140–400)
RBC: 2.66 10*6/uL — ABNORMAL LOW (ref 4.20–5.82)
RDW: 20 % — ABNORMAL HIGH (ref 11.0–14.6)
WBC: 9.2 10*3/uL (ref 4.0–10.3)
lymph#: 0.7 10*3/uL — ABNORMAL LOW (ref 0.9–3.3)

## 2013-08-03 LAB — HOLD TUBE, BLOOD BANK

## 2013-08-03 LAB — LACTATE DEHYDROGENASE (CC13): LDH: 133 U/L (ref 125–245)

## 2013-08-03 MED ORDER — DIPHENHYDRAMINE HCL 25 MG PO CAPS
25.0000 mg | ORAL_CAPSULE | Freq: Once | ORAL | Status: AC
  Administered 2013-08-03: 25 mg via ORAL

## 2013-08-03 MED ORDER — SODIUM CHLORIDE 0.9 % IV SOLN
250.0000 mL | Freq: Once | INTRAVENOUS | Status: AC
  Administered 2013-08-03: 250 mL via INTRAVENOUS

## 2013-08-03 MED ORDER — MIRTAZAPINE 30 MG PO TABS: 30.0000 mg | ORAL_TABLET | Freq: Every day | ORAL | Status: DC

## 2013-08-03 MED ORDER — DIPHENHYDRAMINE HCL 25 MG PO CAPS
ORAL_CAPSULE | ORAL | Status: AC
  Filled 2013-08-03: qty 1

## 2013-08-03 MED ORDER — ACETAMINOPHEN 325 MG PO TABS
650.0000 mg | ORAL_TABLET | Freq: Once | ORAL | Status: AC
  Administered 2013-08-03: 650 mg via ORAL

## 2013-08-03 MED ORDER — ACETAMINOPHEN 325 MG PO TABS
ORAL_TABLET | ORAL | Status: AC
  Filled 2013-08-03: qty 2

## 2013-08-03 NOTE — Patient Instructions (Signed)
Blood Transfusion Information WHAT IS A BLOOD TRANSFUSION? A transfusion is the replacement of blood or some of its parts. Blood is made up of multiple cells which provide different functions.  Red blood cells carry oxygen and are used for blood loss replacement.  White blood cells fight against infection.  Platelets control bleeding.  Plasma helps clot blood.  Other blood products are available for specialized needs, such as hemophilia or other clotting disorders. BEFORE THE TRANSFUSION  Who gives blood for transfusions?   You may be able to donate blood to be used at a later date on yourself (autologous donation).  Relatives can be asked to donate blood. This is generally not any safer than if you have received blood from a stranger. The same precautions are taken to ensure safety when a relative's blood is donated.  Healthy volunteers who are fully evaluated to make sure their blood is safe. This is blood bank blood. Transfusion therapy is the safest it has ever been in the practice of medicine. Before blood is taken from a donor, a complete history is taken to make sure that person has no history of diseases nor engages in risky social behavior (examples are intravenous drug use or sexual activity with multiple partners). The donor's travel history is screened to minimize risk of transmitting infections, such as malaria. The donated blood is tested for signs of infectious diseases, such as HIV and hepatitis. The blood is then tested to be sure it is compatible with you in order to minimize the chance of a transfusion reaction. If you or a relative donates blood, this is often done in anticipation of surgery and is not appropriate for emergency situations. It takes many days to process the donated blood. RISKS AND COMPLICATIONS Although transfusion therapy is very safe and saves many lives, the main dangers of transfusion include:   Getting an infectious disease.  Developing a  transfusion reaction. This is an allergic reaction to something in the blood you were given. Every precaution is taken to prevent this. The decision to have a blood transfusion has been considered carefully by your caregiver before blood is given. Blood is not given unless the benefits outweigh the risks. AFTER THE TRANSFUSION  Right after receiving a blood transfusion, you will usually feel much better and more energetic. This is especially true if your red blood cells have gotten low (anemic). The transfusion raises the level of the red blood cells which carry oxygen, and this usually causes an energy increase.  The nurse administering the transfusion will monitor you carefully for complications. HOME CARE INSTRUCTIONS  No special instructions are needed after a transfusion. You may find your energy is better. Speak with your caregiver about any limitations on activity for underlying diseases you may have. SEEK MEDICAL CARE IF:   Your condition is not improving after your transfusion.  You develop redness or irritation at the intravenous (IV) site. SEEK IMMEDIATE MEDICAL CARE IF:  Any of the following symptoms occur over the next 12 hours:  Shaking chills.  You have a temperature by mouth above 102 F (38.9 C), not controlled by medicine.  Chest, back, or muscle pain.  People around you feel you are not acting correctly or are confused.  Shortness of breath or difficulty breathing.  Dizziness and fainting.  You get a rash or develop hives.  You have a decrease in urine output.  Your urine turns a dark color or changes to pink, red, or brown. Any of the following   symptoms occur over the next 10 days:  You have a temperature by mouth above 102 F (38.9 C), not controlled by medicine.  Shortness of breath.  Weakness after normal activity.  The white part of the eye turns yellow (jaundice).  You have a decrease in the amount of urine or are urinating less often.  Your  urine turns a dark color or changes to pink, red, or brown. Document Released: 02/08/2000 Document Revised: 05/05/2011 Document Reviewed: 09/27/2007 [employer] Patient Information 2014 [employer].  

## 2013-08-03 NOTE — Patient Instructions (Signed)
Dehydration, Adult Dehydration is when you lose more fluids from the body than you take in. Vital organs like the kidneys, brain, and heart cannot function without a proper amount of fluids and salt. Any loss of fluids from the body can cause dehydration.  CAUSES   Vomiting.  Diarrhea.  Excessive sweating.  Excessive urine output.  Fever. SYMPTOMS  Mild dehydration  Thirst.  Dry lips.  Slightly dry mouth. Moderate dehydration  Very dry mouth.  Sunken eyes.  Skin does not bounce back quickly when lightly pinched and released.  Dark urine and decreased urine production.  Decreased tear production.  Headache. Severe dehydration  Very dry mouth.  Extreme thirst.  Rapid, weak pulse (more than 100 beats per minute at rest).  Cold hands and feet.  Not able to sweat in spite of heat and temperature.  Rapid breathing.  Blue lips.  Confusion and lethargy.  Difficulty being awakened.  Minimal urine production.  No tears. DIAGNOSIS  Your caregiver will diagnose dehydration based on your symptoms and your exam. Blood and urine tests will help confirm the diagnosis. The diagnostic evaluation should also identify the cause of dehydration. TREATMENT  Treatment of mild or moderate dehydration can often be done at home by increasing the amount of fluids that you drink. It is best to drink small amounts of fluid more often. Drinking too much at one time can make vomiting worse. Refer to the home care instructions below. Severe dehydration needs to be treated at the hospital where you will probably be given intravenous (IV) fluids that contain water and electrolytes. HOME CARE INSTRUCTIONS   Ask your caregiver about specific rehydration instructions.  Drink enough fluids to keep your urine clear or pale yellow.  Drink small amounts frequently if you have nausea and vomiting.  Eat as you normally do.  Avoid:  Foods or drinks high in sugar.  Carbonated  drinks.  Juice.  Extremely hot or cold fluids.  Drinks with caffeine.  Fatty, greasy foods.  Alcohol.  Tobacco.  Overeating.  Gelatin desserts.  Wash your hands well to avoid spreading bacteria and viruses.  Only take over-the-counter or prescription medicines for pain, discomfort, or fever as directed by your caregiver.  Ask your caregiver if you should continue all prescribed and over-the-counter medicines.  Keep all follow-up appointments with your caregiver. SEEK MEDICAL CARE IF:  You have abdominal pain and it increases or stays in one area (localizes).  You have a rash, stiff neck, or severe headache.  You are irritable, sleepy, or difficult to awaken.  You are weak, dizzy, or extremely thirsty. SEEK IMMEDIATE MEDICAL CARE IF:   You are unable to keep fluids down or you get worse despite treatment.  You have frequent episodes of vomiting or diarrhea.  You have blood or green matter (bile) in your vomit.  You have blood in your stool or your stool looks black and tarry.  You have not urinated in 6 to 8 hours, or you have only urinated a small amount of very dark urine.  You have a fever.  You faint. MAKE SURE YOU:   Understand these instructions.  Will watch your condition.  Will get help right away if you are not doing well or get worse. Document Released: 02/10/2005 Document Revised: 05/05/2011 Document Reviewed: 09/30/2010 [facility] Patient Information 2014 [place], [place]. Constipation, Adult Constipation is when a person has fewer than 3 bowel movements a week; has difficulty having a bowel movement; or has stools that are dry, hard,  or larger than normal. As people grow older, constipation is more common. If you try to fix constipation with medicines that make you have a bowel movement (laxatives), the problem may get worse. Long-term laxative use may cause the muscles of the colon to become weak. A low-fiber diet, not taking in enough fluids, and  taking certain medicines may make constipation worse. CAUSES   Certain medicines, such as antidepressants, pain medicine, iron supplements, antacids, and water pills.   Certain diseases, such as diabetes, irritable bowel syndrome (IBS), thyroid disease, or depression.   Not drinking enough water.   Not eating enough fiber-rich foods.   Stress or travel.  Lack of physical activity or exercise.  Not going to the restroom when there is the urge to have a bowel movement.  Ignoring the urge to have a bowel movement.  Using laxatives too much. SYMPTOMS   Having fewer than 3 bowel movements a week.   Straining to have a bowel movement.   Having hard, dry, or larger than normal stools.   Feeling full or bloated.   Pain in the lower abdomen.  Not feeling relief after having a bowel movement. DIAGNOSIS  Your caregiver will take a medical history and perform a physical exam. Further testing may be done for severe constipation. Some tests may include:   A barium enema X-ray to examine your rectum, colon, and sometimes, your small intestine.  A sigmoidoscopy to examine your lower colon.  A colonoscopy to examine your entire colon. TREATMENT  Treatment will depend on the severity of your constipation and what is causing it. Some dietary treatments include drinking more fluids and eating more fiber-rich foods. Lifestyle treatments may include regular exercise. If these diet and lifestyle recommendations do not help, your caregiver may recommend taking over-the-counter laxative medicines to help you have bowel movements. Prescription medicines may be prescribed if over-the-counter medicines do not work.  HOME CARE INSTRUCTIONS   Increase dietary fiber in your diet, such as fruits, vegetables, whole grains, and beans. Limit high-fat and processed sugars in your diet, such as [place] fries, hamburgers, cookies, candies, and soda.   A fiber supplement may be added to your diet if  you cannot get enough fiber from foods.   Drink enough fluids to keep your urine clear or pale yellow.   Exercise regularly or as directed by your caregiver.   Go to the restroom when you have the urge to go. Do not hold it.  Only take medicines as directed by your caregiver. Do not take other medicines for constipation without talking to your caregiver first. [place] IF:   You have bright red blood in your stool.   Your constipation lasts for more than 4 days or gets worse.   You have abdominal or rectal pain.   You have thin, pencil-like stools.  You have unexplained weight loss. MAKE SURE YOU:   Understand these instructions.  Will watch your condition.  Will get help right away if you are not doing well or get worse. Document Released: 11/09/2003 Document Revised: 05/05/2011 Document Reviewed: 11/22/2012 [facility] Patient Information 2014 [place], [place]. Mirtazapine tablets What is this medicine? MIRTAZAPINE (mir TAZ a peen) is used to treat depression. This medicine may be used for other purposes; ask your health care provider or pharmacist if you have questions. COMMON BRAND NAME(S): Remeron What should I tell my health care provider before I take this medicine? They need to know if you have any of these conditions: -  bipolar disorder -glaucoma -kidney disease -liver disease -suicidal thoughts -an unusual or allergic reaction to mirtazapine, other medicines, foods, dyes, or preservatives -pregnant or trying to get pregnant -breast-feeding How should I use this medicine? Take this medicine by mouth with a glass of water. Follow the directions on the prescription label. Take your medicine at regular intervals. Do not take your medicine more often than directed. Do not stop taking this medicine suddenly except upon the advice of your doctor. Stopping this medicine too quickly may cause serious side effects or your condition may worsen. A  special MedGuide will be given to you by the pharmacist with each prescription and refill. Be sure to read this information carefully each time. Talk to your pediatrician regarding the use of this medicine in children. Special care may be needed. Overdosage: If you think you have taken too much of this medicine contact a poison control center or emergency room at once. NOTE: This medicine is only for you. Do not share this medicine with others. What if I miss a dose? If you miss a dose, take it as soon as you can. If it is almost time for your next dose, take only that dose. Do not take double or extra doses. What may interact with this medicine? Do not take this medicine with any of the following medications: -linezolid -MAOIs like Carbex, Eldepryl, Marplan, Nardil, and Parnate -methylene blue (injected into a vein) This medicine may also interact with the following medications: -alcohol -antiviral medicines for HIV or AIDS -certain medicines that treat or prevent blood clots like warfarin -certain medicines for depression, anxiety, or psychotic disturbances -certain medicines for fungal infections like ketoconazole and itraconazole -certain medicines for migraine headache like almotriptan, eletriptan, frovatriptan, naratriptan, rizatriptan, sumatriptan, zolmitriptan -certain medicines for seizures like carbamazepine or phenytoin -certain medicines for sleep -cimetidine -erythromycin -fentanyl -lithium -medicines for blood pressure -nefazodone -rasagiline -rifampin -supplements like St. John's wort, kava kava, valerian -tramadol -tryptophan This list may not describe all possible interactions. Give your health care provider a list of all the medicines, herbs, non-prescription drugs, or dietary supplements you use. Also tell them if you smoke, drink alcohol, or use illegal drugs. Some items may interact with your medicine. What should I watch for while using this medicine? Tell your  doctor if your symptoms do not get better or if they get worse. Visit your doctor or health care professional for regular checks on your progress. Because it may take several weeks to see the full effects of this medicine, it is important to continue your treatment as prescribed by your doctor. Patients and their families should watch out for new or worsening thoughts of suicide or depression. Also watch out for sudden changes in feelings such as feeling anxious, agitated, panicky, irritable, hostile, aggressive, impulsive, severely restless, overly excited and hyperactive, or not being able to sleep. If this happens, especially at the beginning of treatment or after a change in dose, call your health care professional. [place] Bast may get drowsy or dizzy. Do not drive, use machinery, or do anything that needs mental alertness until you know how this medicine affects you. Do not stand or sit up quickly, especially if you are an older patient. This reduces the risk of dizzy or fainting spells. Alcohol may interfere with the effect of this medicine. Avoid alcoholic drinks. This medicine may cause dry eyes and blurred vision. If you wear contact lenses you may feel some discomfort. Lubricating drops may help. See your eye doctor if the  problem does not go away or is severe. Your mouth may get dry. Chewing sugarless gum or sucking hard candy, and drinking plenty of water may help. Contact your doctor if the problem does not go away or is severe. What side effects may I notice from receiving this medicine? Side effects that you should report to your doctor or health care professional as soon as possible: -allergic reactions like skin rash, itching or hives, swelling of the face, lips, or tongue -breathing problems -confusion -fever, sore throat, or mouth ulcers or blisters -flu like symptoms including fever, chills, cough, muscle or joint aches and pains -stomach pain with nausea and/or vomiting -suicidal thoughts  or other mood changes -swelling of the hands or feet -unusual bleeding or bruising -unusually weak or tired -vomiting Side effects that usually do not require medical attention (report to your doctor or health care professional if they continue or are bothersome): -constipation -increased appetite -weight gain This list may not describe all possible side effects. Call your doctor for medical advice about side effects. You may report side effects to FDA at 1-800-FDA-1088. Where should I keep my medicine? Keep out of the reach of children. Store at room temperature between 15 and 30 degrees C (59 and 86 degrees F) Protect from light and moisture. Throw away any unused medicine after the expiration date. NOTE: This sheet is a summary. It may not cover all possible information. If you have questions about this medicine, talk to your doctor, pharmacist, or health care provider.  2014, [employer]. (2012-09-03 13:11:19)

## 2013-08-03 NOTE — Progress Notes (Signed)
[facility] OFFICE PROGRESS NOTE  [name], [place] [address]  DIAGNOSIS: Small cell carcinoma of lung  Chief Complaint  Patient presents with  . Follow-up    CURRENT TREATMENT: Cycle # 1 of Carboplatin (on day#1 of 260 mg)  plus etoposide (75 mg/m2 daily on 04/29, 04/30 and 5/1) started on 06/22/2013.  He completed neulasta on 06/25/2013.  Cycle #2 of Carboplatin (on day#1 of 260 mg)  plus etoposide (75 mg/m2 daily on 05/20, 05/21 and 5/22).  He completed neulasta on 07/16/2013.    PRIOR TREATMENT:  WB XRT started on 03/10 - 03/25 per Dr. Pablo Ledger. He completed one cycle of cisplatin (79 mg/m2 on day #1) plus etoposide (100 mg/m2 on 3/25, 3/26 and 05/20/2013.  He is scheduled for cycle #2 of Carbo plus etoposide starting today.   INTERVAL HISTORY: Ricky Villanueva 63 y.o. male with a history of small cell lung cancer is here for follow up. He was last seen by Awilda Metro on 07/13/2013.   His past medical history also includes HTN/Hyperlipidemia and longstanding smoking history but quit 12/25/2011 presented to ER on 04/21/13 with worsening dyspnea, cough with streaks of blood over the past one week. In the ED, his oxygen saturation was 91% on 2 L nasal canula. CXR revealed a progressive increased density in the right lower lobe consistent with worsening atelectasis or pneumonia and a soft tissue fullness in the hilar region suspicious for lymphadenopathy or mass.and follow-up CT of chest was recommended. It revealed a nonocclusive left upper lobe, right lower lobe pulmonary emboli with right heart strain. Bulky mediastinal and right hilar lymphadenopathy, encasing the of pulmonary arteries, pulmonary veins and bronchi, completely effacing right lower lobe segmental bronchi. Extensive consolidation in the right lung suggested postobstructive pneumonia, with interstitial prominence which may reflect superimposed lymphangitic spread of infection or neoplasm.  He was admitted and started on antibiotics and heparin. Pulmonary was consulted and he had a video bronchoscopy on 03/03 with consistent with small cell ca of lung. 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 He had WBXRT as noted above. He then received his first cycle of chemotherapy on 03/25 thru 03/27 and was admitted 04/01 and discharged on 05/31/13 due to dehydration, acute renal failure, mucositis and subsequently had febrile neutropenia. We restarted the Xarelto which held due to profound thrombocytopenia.  Today, he is accompanied by his wife, daughter and his son-and-law and son.  He reports feeling fatigued and lightheaded when standing. He reports decreased appetite despite intermittent compliance with marinol.  He reported to the emergency room on 5/28 with atypical chest pain.  His troponins were negative.  He had an intermin CT of the chest.   MEDICAL HISTORY: Past Medical History  Diagnosis Date  . Hyperlipidemia   . Hypertension   . GERD (gastroesophageal reflux disease)   . Respiratory failure with hypoxia 04/21/2013    secondary to pneumonia/notes 04/21/2013  . Pulmonary embolism     "got one in there now" (04/21/2013)  . COPD (chronic obstructive pulmonary disease)   . Pneumonia 2/?01/2014    "wouldn't get better; hospitalized 04/21/2013)  . Arthritis     "minor in my right hand" (04/21/2013)  . Cancer     lung ca dx'd 03/2013  . History of radiation therapy 05/03/13-05/18/13    chest & whole brain  INTERIM HISTORY: has GERD (gastroesophageal reflux disease); HCAP (healthcare-associated pneumonia); Respiratory failure with hypoxia; HTN (hypertension); Acute pulmonary embolism; Lung mass; Emphysema lung; Small cell carcinoma of lung; Acute renal failure; Protein-calorie malnutrition, severe; and Anorexia on his problem list.    ALLERGIES:  is allergic to  augmentin.  MEDICATIONS: has a current medication list which includes the following prescription(s): albuterol, dss, levofloxacin, lorazepam, morphine, omeprazole-sodium bicarbonate, ondansetron, oxycodone, prochlorperazine, rivaroxaban, tiotropium, dexamethasone, and nystatin.  SURGICAL HISTORY:  Past Surgical History  Procedure Laterality Date  . Inguinal hernia repair Right ~ 2005  . Finger surgery Left ~ 1989    "crushed end of my middle finger off"   . Video bronchoscopy Bilateral 04/26/2013    Procedure: VIDEO BRONCHOSCOPY WITH FLUORO;  Surgeon: Wilhelmina Mcardle, MD;  Location: [facility] ENDOSCOPY;  Service: Cardiopulmonary;  Laterality: Bilateral;    REVIEW OF SYSTEMS:   Constitutional: Denies fevers, chills or abnormal weight loss Eyes: Denies blurriness of vision Ears, nose, mouth, throat, and face: Denies mucositis or sore throat Respiratory: Denies cough, dyspnea or wheezes Cardiovascular: Denies palpitation, chest discomfort or lower extremity swelling Gastrointestinal:  Denies nausea, heartburn or change in bowel habits Skin: Denies abnormal skin rashes Lymphatics: Denies new lymphadenopathy or easy bruising Neurological:Denies numbness, tingling or new weaknesses Behavioral/Psych: Mood is stable, no new changes  All other systems were reviewed with the patient and are negative.  PHYSICAL EXAMINATION: ECOG PERFORMANCE STATUS: 1 - Symptomatic but completely ambulatory  Blood pressure 81/60, pulse 96, temperature 97.7 F (36.5 C), temperature source Oral, resp. rate 19, height 5\' 7"  (1.702 m), weight 99 lb 1.6 oz (44.951 kg).  GENERAL:alert, no distress and comfortable; HOH; chronically ill appearing, alopecia.  SKIN: skin color, texture, turgor are normal, no rashes or significant lesions EYES: normal, Conjunctiva are pink and non-injected, sclera clear OROPHARYNX:no exudate, no erythema and lips, buccal mucosa, and tongue normal  NECK: supple, thyroid normal size, non-tender,  without nodularity LYMPH:  no palpable lymphadenopathy in the cervical, axillary or supraclavicular LUNGS: clear to auscultation with normal breathing effort, no wheezes or rhonchi HEART: regular rate & rhythm and no murmurs and no lower extremity edema ABDOMEN:abdomen soft, non-tender and normal bowel sounds Musculoskeletal:no cyanosis of digits and no clubbing  NEURO: alert & oriented x 3 with fluent speech, no focal motor/sensory deficits; Cranial nerves II - XII intact.  Negative rhomberge.  Strength 5/5 in upper and lower extremities bilaterally.  Gait, normal.   Labs:  Lab Results  Component Value Date   WBC 9.2 08/03/2013   HGB 8.3* 08/03/2013   HCT 25.8* 08/03/2013   MCV 97.0 08/03/2013   PLT 351 08/03/2013   NEUTROABS 7.6* 08/03/2013      Chemistry      Component Value Date/Time   NA 140 08/03/2013 1029   NA 139 08/01/2013 1101   K 4.0 08/03/2013 1029   K 4.0 08/01/2013 1101   CL 98 08/01/2013 1101   CO2 26 08/03/2013 1029   CO2 26 08/01/2013 1101   BUN 7.2 08/03/2013 1029   BUN 8 08/01/2013 1101   CREATININE 0.8 08/03/2013 1029   CREATININE 0.92 08/01/2013 1101      Component Value Date/Time   CALCIUM 8.9 08/03/2013 1029   CALCIUM 9.2 08/01/2013 1101   ALKPHOS 78 08/03/2013 1029   ALKPHOS 70 05/26/2013 0405   AST 10 08/03/2013 1029   AST 12 05/26/2013 0405   ALT 8 08/03/2013 1029   ALT 12 05/26/2013 0405   BILITOT 0.31 08/03/2013 1029  BILITOT 0.5 05/26/2013 0405     CBC:  Recent Labs Lab 08/01/13 1101 08/03/13 1029  WBC 11.9* 9.2  NEUTROABS  --  7.6*  HGB 8.4* 8.3*  HCT 25.3* 25.8*  MCV 95.5 97.0  PLT 262 351    Anemia work up No results found for this basename: VITAMINB12, FOLATE, FERRITIN, TIBC, IRON, RETICCTPCT,  in the last 72 hours  Studies:  Ct Chest W Contrast  08/02/2013   CLINICAL DATA:  Small cell lung cancer.  EXAM: CT CHEST, ABDOMEN, AND PELVIS WITH CONTRAST  TECHNIQUE: Multidetector CT imaging of the chest, abdomen and pelvis was performed following the standard  protocol during bolus administration of intravenous contrast.  CONTRAST:  6mL OMNIPAQUE IOHEXOL 300 MG/ML SOLN, 152mL OMNIPAQUE IOHEXOL 300 MG/ML SOLN  FINDINGS: CT CHEST FINDINGS  The heart size appears normal. No pericardial effusion identified. Calcified atherosclerotic disease is again noted involving the thoracic aorta as well as the LAD and RCA Coronary artery. The index right paratracheal lymph node measures 9 mm, image 27/ series 2. Previously 1.4 cm. The index sub- carinal lymph node measures 1.1 cm, image 35/ series 2. Previously 1.6 cm. Improvement in right hilar soft tissue, image 38/series 2. No supraclavicular or axillary adenopathy.  There is no pleural effusion. There are moderate changes of centrilobular and paraseptal emphysema. Ovoid parenchymal density within the posterior lateral right lower lobe is again noted measuring 1.9 cm, image 52/series 2. On the previous exam this measured 2.7 cm. Adjacent retraction of the lung pleura and small loculated pleural fluid is improved from previous exam. Tree-in-bud nodules are identified within the left lower lobe and appear peripheral predominant. Likely postinflammatory.  Review of the visualized osseous structures is significant for mild thoracic spondylosis. No aggressive lytic or sclerotic bone lesion.  CT ABDOMEN AND PELVIS FINDINGS  Tiny areas of low attenuation within the periphery of the right hepatic lobe are unchanged, image 66/series 2. The gallbladder appears normal. Mild increase caliber of the common bile duct measures up to 1 cm. This is similar to previous exam. Normal appearance of the pancreas. Central area of low attenuation within the splenic hilum measures 7 mm, image 64/series 2. Small bilateral renal cysts are identified. The urinary bladder appears normal. Prostate gland and seminal vesicles are unremarkable.  Calcified atherosclerotic disease involves the abdominal aorta. There is no aneurysm. No upper abdominal adenopathy. There  is no pelvic or inguinal adenopathy. No ascites or focal fluid collections identified. No evidence for peritoneal nodularity or mass.  Review of the visualized bony structures is significant for lumbar degenerative disc disease.  IMPRESSION: 1. Today's study demonstrates a positive response to therapy with continued regression of previously noted right hilar soft tissue and decreased size of mediastinal adenopathy. 2. No findings to suggest new metastatic disease in the abdomen or pelvis. 3. Emphysema 4. Atherosclerotic disease.   Electronically Signed   By: Kerby Moors M.D.   On: 08/02/2013 08:35   Ct Abdomen Pelvis W Contrast  08/02/2013   CLINICAL DATA:  Small cell lung cancer.  EXAM: CT CHEST, ABDOMEN, AND PELVIS WITH CONTRAST  TECHNIQUE: Multidetector CT imaging of the chest, abdomen and pelvis was performed following the standard protocol during bolus administration of intravenous contrast.  CONTRAST:  49mL OMNIPAQUE IOHEXOL 300 MG/ML SOLN, 184mL OMNIPAQUE IOHEXOL 300 MG/ML SOLN  FINDINGS: CT CHEST FINDINGS  The heart size appears normal. No pericardial effusion identified. Calcified atherosclerotic disease is again noted involving the thoracic aorta as well as the LAD  and RCA Coronary artery. The index right paratracheal lymph node measures 9 mm, image 27/ series 2. Previously 1.4 cm. The index sub- carinal lymph node measures 1.1 cm, image 35/ series 2. Previously 1.6 cm. Improvement in right hilar soft tissue, image 38/series 2. No supraclavicular or axillary adenopathy.  There is no pleural effusion. There are moderate changes of centrilobular and paraseptal emphysema. Ovoid parenchymal density within the posterior lateral right lower lobe is again noted measuring 1.9 cm, image 52/series 2. On the previous exam this measured 2.7 cm. Adjacent retraction of the lung pleura and small loculated pleural fluid is improved from previous exam. Tree-in-bud nodules are identified within the left lower lobe and  appear peripheral predominant. Likely postinflammatory.  Review of the visualized osseous structures is significant for mild thoracic spondylosis. No aggressive lytic or sclerotic bone lesion.  CT ABDOMEN AND PELVIS FINDINGS  Tiny areas of low attenuation within the periphery of the right hepatic lobe are unchanged, image 66/series 2. The gallbladder appears normal. Mild increase caliber of the common bile duct measures up to 1 cm. This is similar to previous exam. Normal appearance of the pancreas. Central area of low attenuation within the splenic hilum measures 7 mm, image 64/series 2. Small bilateral renal cysts are identified. The urinary bladder appears normal. Prostate gland and seminal vesicles are unremarkable.  Calcified atherosclerotic disease involves the abdominal aorta. There is no aneurysm. No upper abdominal adenopathy. There is no pelvic or inguinal adenopathy. No ascites or focal fluid collections identified. No evidence for peritoneal nodularity or mass.  Review of the visualized bony structures is significant for lumbar degenerative disc disease.  IMPRESSION: 1. Today's study demonstrates a positive response to therapy with continued regression of previously noted right hilar soft tissue and decreased size of mediastinal adenopathy. 2. No findings to suggest new metastatic disease in the abdomen or pelvis. 3. Emphysema 4. Atherosclerotic disease.   Electronically Signed   By: Kerby Moors M.D.   On: 08/02/2013 08:35     ASSESSMENT: Ricky Villanueva 63 y.o. male with a history of Small cell carcinoma of lung   PLAN:  1. 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 Pablo Ledger completed whole brain XRT on 03/25. We started chemotherapy on 03/25 and he completed Cycle #1 On cisplatin 80 mg/m2 on day 1, etoposide 100 mg/m2 days 1,2,3. He had toxicity related to diminished hearing and hospitalization.  He has been evaluated by Dr. Constance Holster who also recommends  hearing aides.    --Given his complains of hearing fullness and intermittent tinnitus, we discontinued cisplatin and substituted it with carboplatin AUC 3. In addition, we will decrease etoposide dose by 25% due to profound neutropenia. We will also continue neulasta of Day#4 of his chemotherapy each cycle due to febrile neutropenia. Plan continue chemotherapy in one week on 6/17 for his Cycle #3, day #1 of Carboplatin plus etoposide.  He still reports gastritis/esophagitis and anorexia.   --We obtained a CT of abdomen and pelvis and chest (06/09) given above symptoms to assess response to therapy following 2 cycles of therapy which demonstrated an positive interval response to therapy.   2. Gastritis/esophagitis.  --Changed to zegerid OTC with mild improvement in his symptoms.  Seen by GI with a normal barium swallow.   3. Anorexia.  --Likely secondary to #1. He reports not tolerating marinol so we will prescribe Remeron 30 mg qhs instead.   4. PE (04/21/2013).  --CTA of chest  noted above consistent with Nonocclusive left upper lobe, right lower lobe pulmonary emboli. Right heart strain. Continue xarelto 20 mg daily.   5. Anemia secondary to #1 plus/minus chemotherapy, symptomatic with fatigue and lightheadedness when standing.  --We will facilitate receipt of 2 units of packed RBCs tomorrow given his symptoms.  We will consider adding aranesp due to chemotherapy induced anemia.   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 --We DISCONTINUED cisplatin and now patient is on carboplatin.  He has been fitted and received his hearing aids.   7. Dehydration and hypotension.  --We will continue Normal saline prn  8. Emphysema  --Continue spiriva hanihaler 18 mcg inhalation daily. Oxygen 2 liters by [place] prn for moderate exertion.   9. Follow-up.  --Patient will have labs on 06/17 for consideration of chemotherapy with carbo plus etoposide cycle #3, day #1.  He will follow up in 4 weeks for consideration  of cycle #4, day #1.    All questions were answered. The patient knows to call the clinic with any problems, questions or concerns. We can certainly see the patient much sooner if necessary.  I spent 25 minutes counseling the patient face to face. The total time spent in the appointment was 40 minutes.    Zakyria Metzinger, MD 08/03/2013 11:41 AM

## 2013-08-04 ENCOUNTER — Telehealth: Payer: Self-pay | Admitting: *Deleted

## 2013-08-04 ENCOUNTER — Ambulatory Visit: Payer: BC Managed Care – PPO

## 2013-08-04 ENCOUNTER — Encounter: Payer: Self-pay | Admitting: Internal Medicine

## 2013-08-04 LAB — TYPE AND SCREEN
ABO/RH(D): A POS
ANTIBODY SCREEN: NEGATIVE
UNIT DIVISION: 0
Unit division: 0

## 2013-08-04 LAB — PREPARE RBC (CROSSMATCH)

## 2013-08-04 NOTE — Telephone Encounter (Signed)
Per staff message and POF I have scheduled appts. I have called and gave the wife the appts  JMW

## 2013-08-05 ENCOUNTER — Telehealth: Payer: Self-pay

## 2013-08-05 ENCOUNTER — Other Ambulatory Visit: Payer: Self-pay | Admitting: Medical Oncology

## 2013-08-05 ENCOUNTER — Encounter: Payer: Self-pay | Admitting: Medical Oncology

## 2013-08-05 ENCOUNTER — Ambulatory Visit: Payer: BC Managed Care – PPO

## 2013-08-05 ENCOUNTER — Telehealth: Payer: Self-pay | Admitting: Medical Oncology

## 2013-08-05 NOTE — Telephone Encounter (Signed)
[name]-daughter called stating that her dad is completely out of his spiriva. She called the [facility] and they told her they can not fill the prescription because [facility] has it in progress.If they could it would cost $300.  I called the [facility] to see if we can get pt a few for the week-end. I was informed that with the spiriva you have to get 30 vials at a time and it is very expensive. I asked about pt using his albuterol inhaler today until he gets the spiriva. He states he feels this will be ok even though the two medications work differently.  I called [facility] and they said that the drug will be shipped today and pt should get it tomorrow. I discussed with [name] and she voiced understanding. I did inform her the albuterol could make his pulse rate and BP higher. I asked her to call me if any problems or concerns.

## 2013-08-05 NOTE — Telephone Encounter (Signed)
Spoke with patient's wife to give follow up appointment on 09/02/13 at 3:00 pm to review mri results and informed to write down whatever questions they may have to review with doctor at that time in response to questions from daughter Ricky Villanueva via non-urgent in basket.I called Ricky Villanueva's phone but no answer.Told Ricky Villanueva to have daughter call me if she has any further questions.

## 2013-08-06 ENCOUNTER — Ambulatory Visit: Payer: BC Managed Care – PPO

## 2013-08-09 ENCOUNTER — Ambulatory Visit: Payer: BC Managed Care – PPO | Admitting: Gastroenterology

## 2013-08-10 ENCOUNTER — Other Ambulatory Visit (HOSPITAL_BASED_OUTPATIENT_CLINIC_OR_DEPARTMENT_OTHER): Payer: BC Managed Care – PPO

## 2013-08-10 ENCOUNTER — Ambulatory Visit (HOSPITAL_BASED_OUTPATIENT_CLINIC_OR_DEPARTMENT_OTHER): Payer: BC Managed Care – PPO

## 2013-08-10 VITALS — BP 101/73 | HR 82 | Temp 97.8°F | Resp 18

## 2013-08-10 DIAGNOSIS — C3490 Malignant neoplasm of unspecified part of unspecified bronchus or lung: Secondary | ICD-10-CM

## 2013-08-10 DIAGNOSIS — C7A1 Malignant poorly differentiated neuroendocrine tumors: Secondary | ICD-10-CM

## 2013-08-10 DIAGNOSIS — C7B8 Other secondary neuroendocrine tumors: Secondary | ICD-10-CM

## 2013-08-10 DIAGNOSIS — Z5111 Encounter for antineoplastic chemotherapy: Secondary | ICD-10-CM

## 2013-08-10 LAB — CBC WITH DIFFERENTIAL/PLATELET
BASO%: 0.6 % (ref 0.0–2.0)
Basophils Absolute: 0 10*3/uL (ref 0.0–0.1)
EOS%: 1.4 % (ref 0.0–7.0)
Eosinophils Absolute: 0.1 10*3/uL (ref 0.0–0.5)
HEMATOCRIT: 35.5 % — AB (ref 38.4–49.9)
HGB: 11.4 g/dL — ABNORMAL LOW (ref 13.0–17.1)
LYMPH%: 12.5 % — AB (ref 14.0–49.0)
MCH: 30.7 pg (ref 27.2–33.4)
MCHC: 32.1 g/dL (ref 32.0–36.0)
MCV: 95.7 fL (ref 79.3–98.0)
MONO#: 0.9 10*3/uL (ref 0.1–0.9)
MONO%: 13.2 % (ref 0.0–14.0)
NEUT%: 72.3 % (ref 39.0–75.0)
NEUTROS ABS: 5 10*3/uL (ref 1.5–6.5)
Platelets: 369 10*3/uL (ref 140–400)
RBC: 3.71 10*6/uL — AB (ref 4.20–5.82)
RDW: 17.3 % — ABNORMAL HIGH (ref 11.0–14.6)
WBC: 6.9 10*3/uL (ref 4.0–10.3)
lymph#: 0.9 10*3/uL (ref 0.9–3.3)

## 2013-08-10 LAB — BASIC METABOLIC PANEL (CC13)
ANION GAP: 10 meq/L (ref 3–11)
BUN: 17.7 mg/dL (ref 7.0–26.0)
CALCIUM: 9.5 mg/dL (ref 8.4–10.4)
CO2: 26 meq/L (ref 22–29)
CREATININE: 0.7 mg/dL (ref 0.7–1.3)
Chloride: 103 mEq/L (ref 98–109)
GLUCOSE: 98 mg/dL (ref 70–140)
Potassium: 4.4 mEq/L (ref 3.5–5.1)
SODIUM: 139 meq/L (ref 136–145)

## 2013-08-10 LAB — LACTATE DEHYDROGENASE (CC13): LDH: 135 U/L (ref 125–245)

## 2013-08-10 MED ORDER — SODIUM CHLORIDE 0.9 % IJ SOLN
10.0000 mL | INTRAMUSCULAR | Status: DC | PRN
  Filled 2013-08-10: qty 10

## 2013-08-10 MED ORDER — SODIUM CHLORIDE 0.9 % IV SOLN
Freq: Once | INTRAVENOUS | Status: AC
  Administered 2013-08-10: 09:00:00 via INTRAVENOUS

## 2013-08-10 MED ORDER — SODIUM CHLORIDE 0.9 % IV SOLN
75.0000 mg/m2 | Freq: Once | INTRAVENOUS | Status: AC
  Administered 2013-08-10: 120 mg via INTRAVENOUS
  Filled 2013-08-10: qty 6

## 2013-08-10 MED ORDER — ONDANSETRON 16 MG/50ML IVPB (CHCC)
16.0000 mg | Freq: Once | INTRAVENOUS | Status: AC
  Administered 2013-08-10: 16 mg via INTRAVENOUS

## 2013-08-10 MED ORDER — DEXAMETHASONE SODIUM PHOSPHATE 20 MG/5ML IJ SOLN
20.0000 mg | Freq: Once | INTRAMUSCULAR | Status: AC
  Administered 2013-08-10: 20 mg via INTRAVENOUS

## 2013-08-10 MED ORDER — DEXAMETHASONE SODIUM PHOSPHATE 20 MG/5ML IJ SOLN
INTRAMUSCULAR | Status: AC
  Filled 2013-08-10: qty 5

## 2013-08-10 MED ORDER — ONDANSETRON 16 MG/50ML IVPB (CHCC)
INTRAVENOUS | Status: AC
  Filled 2013-08-10: qty 16

## 2013-08-10 MED ORDER — SODIUM CHLORIDE 0.9 % IV SOLN
260.0000 mg | Freq: Once | INTRAVENOUS | Status: AC
  Administered 2013-08-10: 260 mg via INTRAVENOUS
  Filled 2013-08-10: qty 26

## 2013-08-10 MED ORDER — HEPARIN SOD (PORK) LOCK FLUSH 100 UNIT/ML IV SOLN
500.0000 [IU] | Freq: Once | INTRAVENOUS | Status: DC | PRN
  Filled 2013-08-10: qty 5

## 2013-08-10 NOTE — Patient Instructions (Signed)
Carboplatin injection What is this medicine? CARBOPLATIN (KAR boe pla tin) is a chemotherapy drug. It targets fast dividing cells, like cancer cells, and causes these cells to die. This medicine is used to treat ovarian cancer and many other cancers. This medicine may be used for other purposes; ask your health care provider or pharmacist if you have questions. COMMON BRAND NAME(S): Paraplatin What should I tell my health care provider before I take this medicine? They need to know if you have any of these conditions: -blood disorders -hearing problems -kidney disease -recent or ongoing radiation therapy -an unusual or allergic reaction to carboplatin, cisplatin, other chemotherapy, other medicines, foods, dyes, or preservatives -pregnant or trying to get pregnant -breast-feeding How should I use this medicine? This drug is usually given as an infusion into a vein. It is administered in a hospital or clinic by a specially trained health care professional. Talk to your pediatrician regarding the use of this medicine in children. Special care may be needed. Overdosage: If you think you have taken too much of this medicine contact a poison control center or emergency room at once. NOTE: This medicine is only for you. Do not share this medicine with others. What if I miss a dose? It is important not to miss a dose. Call your doctor or health care professional if you are unable to keep an appointment. What may interact with this medicine? -medicines for seizures -medicines to increase blood counts like filgrastim, pegfilgrastim, sargramostim -some antibiotics like amikacin, gentamicin, neomycin, streptomycin, tobramycin -vaccines Talk to your doctor or health care professional before taking any of these medicines: -acetaminophen -aspirin -ibuprofen -ketoprofen -naproxen This list may not describe all possible interactions. Give your health care provider a list of all the medicines, herbs,  non-prescription drugs, or dietary supplements you use. Also tell them if you smoke, drink alcohol, or use illegal drugs. Some items may interact with your medicine. What should I watch for while using this medicine? Your condition will be monitored carefully while you are receiving this medicine. You will need important blood work done while you are taking this medicine. This drug may make you feel generally unwell. This is not uncommon, as chemotherapy can affect healthy cells as well as cancer cells. Report any side effects. Continue your course of treatment even though you feel ill unless your doctor tells you to stop. In some cases, you may be given additional medicines to help with side effects. Follow all directions for their use. Call your doctor or health care professional for advice if you get a fever, chills or sore throat, or other symptoms of a cold or flu. Do not treat yourself. This drug decreases your body's ability to fight infections. Try to avoid being around people who are sick. This medicine may increase your risk to bruise or bleed. Call your doctor or health care professional if you notice any unusual bleeding. Be careful brushing and flossing your teeth or using a toothpick because you may get an infection or bleed more easily. If you have any dental work done, tell your dentist you are receiving this medicine. Avoid taking products that contain aspirin, acetaminophen, ibuprofen, naproxen, or ketoprofen unless instructed by your doctor. These medicines may hide a fever. Do not become pregnant while taking this medicine. Women should inform their doctor if they wish to become pregnant or think they might be pregnant. There is a potential for serious side effects to an unborn child. Talk to your health care professional or  pharmacist for more information. Do not breast-feed an infant while taking this medicine. What side effects may I notice from receiving this medicine? Side effects  that you should report to your doctor or health care professional as soon as possible: -allergic reactions like skin rash, itching or hives, swelling of the face, lips, or tongue -signs of infection - fever or chills, cough, sore throat, pain or difficulty passing urine -signs of decreased platelets or bleeding - bruising, pinpoint red spots on the skin, black, tarry stools, nosebleeds -signs of decreased red blood cells - unusually weak or tired, fainting spells, lightheadedness -breathing problems -changes in hearing -changes in vision -chest pain -high blood pressure -low blood counts - This drug may decrease the number of white blood cells, red blood cells and platelets. You may be at increased risk for infections and bleeding. -nausea and vomiting -pain, swelling, redness or irritation at the injection site -pain, tingling, numbness in the hands or feet -problems with balance, talking, walking -trouble passing urine or change in the amount of urine Side effects that usually do not require medical attention (report to your doctor or health care professional if they continue or are bothersome): -hair loss -loss of appetite -metallic taste in the mouth or changes in taste This list may not describe all possible side effects. Call your doctor for medical advice about side effects. You may report side effects to FDA at 1-800-FDA-1088. Where should I keep my medicine? This drug is given in a hospital or clinic and will not be stored at home. NOTE: This sheet is a summary. It may not cover all possible information. If you have questions about this medicine, talk to your doctor, pharmacist, or health care provider.  2014, Elsevier/Gold Standard. (2007-05-18 14:38:05) Etoposide, VP-16 injection What is this medicine? ETOPOSIDE, VP-16 (e toe POE side) is a chemotherapy drug. It is used to treat testicular cancer, lung cancer, and other cancers. This medicine may be used for other purposes;  ask your health care provider or pharmacist if you have questions. COMMON BRAND NAME(S): Etopophos, Toposar, VePesid What should I tell my health care provider before I take this medicine? They need to know if you have any of these conditions: -infection -kidney disease -low blood counts, like low white cell, platelet, or red cell counts -an unusual or allergic reaction to etoposide, other chemotherapeutic agents, other medicines, foods, dyes, or preservatives -pregnant or trying to get pregnant -breast-feeding How should I use this medicine? This medicine is for infusion into a vein. It is administered in a hospital or clinic by a specially trained health care professional. Talk to your pediatrician regarding the use of this medicine in children. Special care may be needed. Overdosage: If you think you have taken too much of this medicine contact a poison control center or emergency room at once. NOTE: This medicine is only for you. Do not share this medicine with others. What if I miss a dose? It is important not to miss your dose. Call your doctor or health care professional if you are unable to keep an appointment. What may interact with this medicine? -cyclosporine -medicines to increase blood counts like filgrastim, pegfilgrastim, sargramostim -vaccines This list may not describe all possible interactions. Give your health care provider a list of all the medicines, herbs, non-prescription drugs, or dietary supplements you use. Also tell them if you smoke, drink alcohol, or use illegal drugs. Some items may interact with your medicine. What should I watch for while using this  medicine? Visit your doctor for checks on your progress. This drug may make you feel generally unwell. This is not uncommon, as chemotherapy can affect healthy cells as well as cancer cells. Report any side effects. Continue your course of treatment even though you feel ill unless your doctor tells you to stop. In  some cases, you may be given additional medicines to help with side effects. Follow all directions for their use. Call your doctor or health care professional for advice if you get a fever, chills or sore throat, or other symptoms of a cold or flu. Do not treat yourself. This drug decreases your body's ability to fight infections. Try to avoid being around people who are sick. This medicine may increase your risk to bruise or bleed. Call your doctor or health care professional if you notice any unusual bleeding. Be careful brushing and flossing your teeth or using a toothpick because you may get an infection or bleed more easily. If you have any dental work done, tell your dentist you are receiving this medicine. Avoid taking products that contain aspirin, acetaminophen, ibuprofen, naproxen, or ketoprofen unless instructed by your doctor. These medicines may hide a fever. Do not become pregnant while taking this medicine. Women should inform their doctor if they wish to become pregnant or think they might be pregnant. There is a potential for serious side effects to an unborn child. Talk to your health care professional or pharmacist for more information. Do not breast-feed an infant while taking this medicine. What side effects may I notice from receiving this medicine? Side effects that you should report to your doctor or health care professional as soon as possible: -allergic reactions like skin rash, itching or hives, swelling of the face, lips, or tongue -low blood counts - this medicine may decrease the number of white blood cells, red blood cells and platelets. You may be at increased risk for infections and bleeding. -signs of infection - fever or chills, cough, sore throat, pain or difficulty passing urine -signs of decreased platelets or bleeding - bruising, pinpoint red spots on the skin, black, tarry stools, blood in the urine -signs of decreased red blood cells - unusually weak or tired,  fainting spells, lightheadedness -breathing problems -changes in vision -mouth or throat sores or ulcers -pain, redness, swelling or irritation at the injection site -pain, tingling, numbness in the hands or feet -redness, blistering, peeling or loosening of the skin, including inside the mouth -seizures -vomiting Side effects that usually do not require medical attention (report to your doctor or health care professional if they continue or are bothersome): -diarrhea -hair loss -loss of appetite -nausea -stomach pain This list may not describe all possible side effects. Call your doctor for medical advice about side effects. You may report side effects to FDA at 1-800-FDA-1088. Where should I keep my medicine? This drug is given in a hospital or clinic and will not be stored at home. NOTE: This sheet is a summary. It may not cover all possible information. If you have questions about this medicine, talk to your doctor, pharmacist, or health care provider.  2014, Elsevier/Gold Standard. (2007-06-14 17:24:12)

## 2013-08-11 ENCOUNTER — Ambulatory Visit (HOSPITAL_BASED_OUTPATIENT_CLINIC_OR_DEPARTMENT_OTHER): Payer: BC Managed Care – PPO

## 2013-08-11 DIAGNOSIS — Z5111 Encounter for antineoplastic chemotherapy: Secondary | ICD-10-CM

## 2013-08-11 DIAGNOSIS — C3490 Malignant neoplasm of unspecified part of unspecified bronchus or lung: Secondary | ICD-10-CM

## 2013-08-11 DIAGNOSIS — C7A1 Malignant poorly differentiated neuroendocrine tumors: Secondary | ICD-10-CM

## 2013-08-11 DIAGNOSIS — C7B8 Other secondary neuroendocrine tumors: Secondary | ICD-10-CM

## 2013-08-11 MED ORDER — PROCHLORPERAZINE MALEATE 10 MG PO TABS
ORAL_TABLET | ORAL | Status: AC
  Filled 2013-08-11: qty 1

## 2013-08-11 MED ORDER — SODIUM CHLORIDE 0.9 % IV SOLN
75.0000 mg/m2 | Freq: Once | INTRAVENOUS | Status: AC
  Administered 2013-08-11: 120 mg via INTRAVENOUS
  Filled 2013-08-11: qty 6

## 2013-08-11 MED ORDER — SODIUM CHLORIDE 0.9 % IV SOLN
Freq: Once | INTRAVENOUS | Status: AC
  Administered 2013-08-11: 13:00:00 via INTRAVENOUS

## 2013-08-11 MED ORDER — PROCHLORPERAZINE MALEATE 10 MG PO TABS
10.0000 mg | ORAL_TABLET | Freq: Once | ORAL | Status: AC
  Administered 2013-08-11: 10 mg via ORAL

## 2013-08-11 NOTE — Patient Instructions (Signed)
Etoposide, VP-16 capsules What is this medicine? ETOPOSIDE, VP-16 (e toe POE side) is a chemotherapy drug. It is used to treat small cell lung cancer and other cancers. This medicine may be used for other purposes; ask your health care provider or pharmacist if you have questions. COMMON BRAND NAME(S): VePesid What should I tell my health care provider before I take this medicine? They need to know if you have any of these conditions: -infection -kidney disease -low blood counts, like low white cell, platelet, or red cell counts -an unusual or allergic reaction to etoposide, other chemotherapeutic agents, other medicines, foods, dyes, or preservatives -pregnant or trying to get pregnant -breast-feeding How should I use this medicine? Take this medicine by mouth with a glass of water. Follow the directions on the prescription label. Do not open, crush, or chew the capsules. It is advisable to wear gloves when handling this medicine. Take your medicine at regular intervals. Do not take it more often than directed. Do not stop taking except on your doctor's advice. Talk to your pediatrician regarding the use of this medicine in children. Special care may be needed. Overdosage: If you think you have taken too much of this medicine contact a poison control center or emergency room at once. NOTE: This medicine is only for you. Do not share this medicine with others. What if I miss a dose? If you miss a dose, take it as soon as you can. If it is almost time for your next dose, take only that dose. Do not take double or extra doses. What may interact with this medicine? -cyclosporine -medicines to increase blood counts like filgrastim, pegfilgrastim, sargramostim -vaccines This list may not describe all possible interactions. Give your health care provider a list of all the medicines, herbs, non-prescription drugs, or dietary supplements you use. Also tell them if you smoke, drink alcohol, or use  illegal drugs. Some items may interact with your medicine. What should I watch for while using this medicine? Visit your doctor for checks on your progress. This drug may make you feel generally unwell. This is not uncommon, as chemotherapy can affect healthy cells as well as cancer cells. Report any side effects. Continue your course of treatment even though you feel ill unless your doctor tells you to stop. In some cases, you may be given additional medicines to help with side effects. Follow all directions for their use. Call your doctor or health care professional for advice if you get a fever, chills or sore throat, or other symptoms of a cold or flu. Do not treat yourself. This drug decreases your body's ability to fight infections. Try to avoid being around people who are sick. This medicine may increase your risk to bruise or bleed. Call your doctor or health care professional if you notice any unusual bleeding. Be careful brushing and flossing your teeth or using a toothpick because you may get an infection or bleed more easily. If you have any dental work done, tell your dentist you are receiving this medicine. Avoid taking products that contain aspirin, acetaminophen, ibuprofen, naproxen, or ketoprofen unless instructed by your doctor. These medicines may hide a fever. Do not become pregnant while taking this medicine. Women should inform their doctor if they wish to become pregnant or think they might be pregnant. There is a potential for serious side effects to an unborn child. Talk to your health care professional or pharmacist for more information. Do not breast-feed an infant while taking this medicine.  What side effects may I notice from receiving this medicine? Side effects that you should report to your doctor or health care professional as soon as possible: -allergic reactions like skin rash, itching or hives, swelling of the face, lips, or tongue -low blood counts - this medicine may  decrease the number of white blood cells, red blood cells and platelets. You may be at increased risk for infections and bleeding. -signs of infection - fever or chills, cough, sore throat, pain or difficulty passing urine -signs of decreased platelets or bleeding - bruising, pinpoint red spots on the skin, black, tarry stools, blood in the urine -signs of decreased red blood cells - unusually weak or tired, fainting spells, lightheadedness -breathing problems -changes in vision -mouth or throat sores or ulcers -pain, tingling, numbness in the hands or feet -redness, blistering, peeling or loosening of the skin, including inside the mouth -seizures -vomiting Side effects that usually do not require medical attention (report to your doctor or health care professional if they continue or are bothersome): -change in taste -diarrhea -hair loss -nausea -stomach pain This list may not describe all possible side effects. Call your doctor for medical advice about side effects. You may report side effects to FDA at 1-800-FDA-1088. Where should I keep my medicine? Keep out of the reach of children. Store in a refrigerator between 2 and 8 degrees C (36 and 46 degrees F). Do not freeze. Throw away any unused medicine after the expiration date. NOTE: This sheet is a summary. It may not cover all possible information. If you have questions about this medicine, talk to your doctor, pharmacist, or health care provider.  2015, Elsevier/Gold Standard. (2007-06-14 17:22:30)

## 2013-08-12 ENCOUNTER — Ambulatory Visit (HOSPITAL_BASED_OUTPATIENT_CLINIC_OR_DEPARTMENT_OTHER): Payer: BC Managed Care – PPO

## 2013-08-12 ENCOUNTER — Other Ambulatory Visit: Payer: Self-pay | Admitting: Internal Medicine

## 2013-08-12 VITALS — BP 120/76 | HR 63 | Temp 97.7°F | Resp 18

## 2013-08-12 DIAGNOSIS — C7A1 Malignant poorly differentiated neuroendocrine tumors: Secondary | ICD-10-CM

## 2013-08-12 DIAGNOSIS — C3490 Malignant neoplasm of unspecified part of unspecified bronchus or lung: Secondary | ICD-10-CM

## 2013-08-12 DIAGNOSIS — C7B8 Other secondary neuroendocrine tumors: Secondary | ICD-10-CM

## 2013-08-12 DIAGNOSIS — Z5111 Encounter for antineoplastic chemotherapy: Secondary | ICD-10-CM

## 2013-08-12 MED ORDER — SODIUM CHLORIDE 0.9 % IV SOLN
Freq: Once | INTRAVENOUS | Status: AC
  Administered 2013-08-12: 12:00:00 via INTRAVENOUS

## 2013-08-12 MED ORDER — PROCHLORPERAZINE MALEATE 10 MG PO TABS
10.0000 mg | ORAL_TABLET | Freq: Once | ORAL | Status: AC
  Administered 2013-08-12: 10 mg via ORAL

## 2013-08-12 MED ORDER — SODIUM CHLORIDE 0.9 % IV SOLN
75.0000 mg/m2 | Freq: Once | INTRAVENOUS | Status: AC
  Administered 2013-08-12: 120 mg via INTRAVENOUS
  Filled 2013-08-12: qty 6

## 2013-08-12 MED ORDER — LORAZEPAM 0.5 MG PO TABS: 0.5000 mg | ORAL_TABLET | Freq: Three times a day (TID) | ORAL | Status: DC | PRN

## 2013-08-12 MED ORDER — PROCHLORPERAZINE MALEATE 10 MG PO TABS
ORAL_TABLET | ORAL | Status: AC
  Filled 2013-08-12: qty 1

## 2013-08-12 NOTE — Patient Instructions (Signed)
[facility] Discharge Instructions for Patients Receiving Chemotherapy  Today you received the following chemotherapy agent: Etoposide   To help prevent nausea and vomiting after your treatment, we encourage you to take your nausea medication as prescribed.    If you develop nausea and vomiting that is not controlled by your nausea medication, call the clinic.   BELOW ARE SYMPTOMS THAT SHOULD BE REPORTED IMMEDIATELY:  *FEVER GREATER THAN 100.5 F  *CHILLS WITH OR WITHOUT FEVER  NAUSEA AND VOMITING THAT IS NOT CONTROLLED WITH YOUR NAUSEA MEDICATION  *UNUSUAL SHORTNESS OF BREATH  *UNUSUAL BRUISING OR BLEEDING  TENDERNESS IN MOUTH AND THROAT WITH OR WITHOUT PRESENCE OF ULCERS  *URINARY PROBLEMS  *BOWEL PROBLEMS  UNUSUAL RASH Items with * indicate a potential emergency and should be followed up as soon as possible.  Feel free to call the clinic you have any questions or concerns. The clinic phone number is [phone].

## 2013-08-13 ENCOUNTER — Ambulatory Visit (HOSPITAL_BASED_OUTPATIENT_CLINIC_OR_DEPARTMENT_OTHER): Payer: BC Managed Care – PPO

## 2013-08-13 VITALS — BP 125/90 | HR 78 | Temp 98.2°F | Resp 18

## 2013-08-13 DIAGNOSIS — Z5189 Encounter for other specified aftercare: Secondary | ICD-10-CM

## 2013-08-13 DIAGNOSIS — C3490 Malignant neoplasm of unspecified part of unspecified bronchus or lung: Secondary | ICD-10-CM

## 2013-08-13 DIAGNOSIS — C7A1 Malignant poorly differentiated neuroendocrine tumors: Secondary | ICD-10-CM

## 2013-08-13 MED ORDER — PEGFILGRASTIM INJECTION 6 MG/0.6ML
6.0000 mg | Freq: Once | SUBCUTANEOUS | Status: AC
  Administered 2013-08-13: 6 mg via SUBCUTANEOUS

## 2013-08-15 ENCOUNTER — Telehealth: Payer: Self-pay

## 2013-08-15 NOTE — Telephone Encounter (Signed)
Msg rcvd from Maudie Mercury - nurse case manager at [employer] advising she is working with the patient for education and support of his lung cancer.  If MD would like her to address anything with the patient, please let her know at [phone].  Otherwise, this is FYI only.    Routed to Dr. Earnest Conroy, copy to Dr. Jana Hakim.

## 2013-08-16 ENCOUNTER — Telehealth: Payer: Self-pay

## 2013-08-16 NOTE — Telephone Encounter (Signed)
S/w [name] that [facility] did not receive any fax from [place] via [employer] center. Asked [name] to [employer] and have them fax forms directly to [facility]. Main fax number given.

## 2013-08-16 NOTE — Telephone Encounter (Signed)
Received message from bonnie Boylan and debbie taylor that pt has lost disability benefits from [place]. Called bonnie back and her information is that [place] sent forms to [place], [place] then faxed forms to [facility]. [facility] should have received them the morning of 6/17.  I left message with Charlena Cross asking if she has gotten the forms. Will call pt back if she has not. [name] said [employer] # [phone] and pt claim [phone].

## 2013-08-16 NOTE — Progress Notes (Signed)
Phone call 6/22 should have been routed to Dr. Juliann Mule.  Re-sent.

## 2013-08-17 ENCOUNTER — Other Ambulatory Visit: Payer: Self-pay | Admitting: Internal Medicine

## 2013-08-17 ENCOUNTER — Telehealth: Payer: Self-pay | Admitting: Medical Oncology

## 2013-08-17 DIAGNOSIS — E860 Dehydration: Secondary | ICD-10-CM

## 2013-08-17 NOTE — Telephone Encounter (Signed)
Pt's daughter [name] called stating that her dad is not drinking very well and is getting dehydrated. She asked if her dad could get IV fluids. I discussed with Dr. Juliann Mule and he ordered fluids for tomorrow. Appointmetn made in [facility] for 9 am. Appointment called to [facility] and orders under signed and held.

## 2013-08-18 ENCOUNTER — Telehealth: Payer: Self-pay

## 2013-08-18 NOTE — Telephone Encounter (Signed)
S/w grandson that we have an appt 6/26 10 am at [facility] for fluids if he needs it. Please call us in AM if he does not need it.

## 2013-08-18 NOTE — Telephone Encounter (Signed)
Ricky Villanueva called stating her father drank fluids well yesterday and feels well enough today. She cancelled appt at [facility] for fluids. She did keep tomorrow's appt in case he needs it. [facility] called.

## 2013-08-19 ENCOUNTER — Non-Acute Institutional Stay (HOSPITAL_COMMUNITY)
Admission: AD | Admit: 2013-08-19 | Discharge: 2013-08-19 | Disposition: A | Discharge status: Home / Self Care | Payer: BC Managed Care – PPO | Source: Ambulatory Visit | Attending: Internal Medicine | Admitting: Internal Medicine

## 2013-08-19 ENCOUNTER — Encounter: Payer: Self-pay | Admitting: Internal Medicine

## 2013-08-19 DIAGNOSIS — K219 Gastro-esophageal reflux disease without esophagitis: Secondary | ICD-10-CM | POA: Insufficient documentation

## 2013-08-19 DIAGNOSIS — E860 Dehydration: Secondary | ICD-10-CM | POA: Insufficient documentation

## 2013-08-19 MED ORDER — SODIUM CHLORIDE 0.9 % IV SOLN
Freq: Once | INTRAVENOUS | Status: AC
Start: 2013-08-19 — End: 2013-08-19
  Administered 2013-08-19: 10:00:00 via INTRAVENOUS

## 2013-08-19 NOTE — H&P (Signed)
Intravenous hydration only.

## 2013-08-19 NOTE — Procedures (Signed)
[facility]  Procedure Note  Ricky Villanueva RXY:[id number] DOB: May 14, 1950 DOA: 08/19/2013   Dr. Juliann Mule  Associated Diagnosis: Dehydration  Procedure Note: IV started, IV fluids infused per order, IV discontinued   Condition During Procedure: patient stable, denies any complaints   Condition at Discharge:ambulatory and stable at discharge   Roberto Scales, [facility]

## 2013-08-20 ENCOUNTER — Encounter (HOSPITAL_COMMUNITY): Payer: Self-pay | Admitting: Emergency Medicine

## 2013-08-20 ENCOUNTER — Emergency Department (HOSPITAL_COMMUNITY)
Admission: EM | Admit: 2013-08-20 | Discharge: 2013-08-20 | Disposition: A | Discharge status: Home / Self Care | Payer: BC Managed Care – PPO | Attending: Emergency Medicine | Admitting: Emergency Medicine

## 2013-08-20 DIAGNOSIS — Z862 Personal history of diseases of the blood and blood-forming organs and certain disorders involving the immune mechanism: Secondary | ICD-10-CM | POA: Insufficient documentation

## 2013-08-20 DIAGNOSIS — Z79899 Other long term (current) drug therapy: Secondary | ICD-10-CM | POA: Insufficient documentation

## 2013-08-20 DIAGNOSIS — Z87891 Personal history of nicotine dependence: Secondary | ICD-10-CM | POA: Insufficient documentation

## 2013-08-20 DIAGNOSIS — K5900 Constipation, unspecified: Secondary | ICD-10-CM | POA: Insufficient documentation

## 2013-08-20 DIAGNOSIS — Z85118 Personal history of other malignant neoplasm of bronchus and lung: Secondary | ICD-10-CM | POA: Insufficient documentation

## 2013-08-20 DIAGNOSIS — Z923 Personal history of irradiation: Secondary | ICD-10-CM | POA: Insufficient documentation

## 2013-08-20 DIAGNOSIS — I10 Essential (primary) hypertension: Secondary | ICD-10-CM | POA: Insufficient documentation

## 2013-08-20 DIAGNOSIS — J449 Chronic obstructive pulmonary disease, unspecified: Secondary | ICD-10-CM | POA: Insufficient documentation

## 2013-08-20 DIAGNOSIS — J4489 Other specified chronic obstructive pulmonary disease: Secondary | ICD-10-CM | POA: Insufficient documentation

## 2013-08-20 DIAGNOSIS — Z86711 Personal history of pulmonary embolism: Secondary | ICD-10-CM | POA: Insufficient documentation

## 2013-08-20 DIAGNOSIS — Z8639 Personal history of other endocrine, nutritional and metabolic disease: Secondary | ICD-10-CM | POA: Insufficient documentation

## 2013-08-20 DIAGNOSIS — Z8739 Personal history of other diseases of the musculoskeletal system and connective tissue: Secondary | ICD-10-CM | POA: Insufficient documentation

## 2013-08-20 DIAGNOSIS — Z7901 Long term (current) use of anticoagulants: Secondary | ICD-10-CM | POA: Insufficient documentation

## 2013-08-20 DIAGNOSIS — K5641 Fecal impaction: Secondary | ICD-10-CM | POA: Insufficient documentation

## 2013-08-20 DIAGNOSIS — K219 Gastro-esophageal reflux disease without esophagitis: Secondary | ICD-10-CM | POA: Insufficient documentation

## 2013-08-20 DIAGNOSIS — Z8701 Personal history of pneumonia (recurrent): Secondary | ICD-10-CM | POA: Insufficient documentation

## 2013-08-20 MED ORDER — POLYETHYLENE GLYCOL 3350 17 GM/SCOOP PO POWD: 17.0000 g | Freq: Every day | ORAL | Status: AC

## 2013-08-20 MED ORDER — FLEET PEDIATRIC 3.5-9.5 GM/59ML RE ENEM: 1.0000 | ENEMA | Freq: Once | RECTAL | Status: DC | PRN

## 2013-08-20 NOTE — ED Provider Notes (Signed)
CSN: [id number]     Arrival date & time 08/20/13  1738 History   First MD Initiated Contact with Patient 08/20/13 1806     Chief Complaint  Patient presents with  . Constipation      HPI Patient presents with complaints of constipation.  No great bowel movement of the past 7-10 days.  He's tried over-the-counter medications including stool softeners and MiraLAX.  Patient reports a small amount of blood that came out of his rectum when attempting to have a bowel movement tonight.  He tried digital disimpaction at home   Past Medical History  Diagnosis Date  . Hyperlipidemia   . Hypertension   . GERD (gastroesophageal reflux disease)   . Respiratory failure with hypoxia 04/21/2013    secondary to pneumonia/notes 04/21/2013  . Pulmonary embolism     "got one in there now" (04/21/2013)  . COPD (chronic obstructive pulmonary disease)   . Pneumonia 2/?01/2014    "wouldn't get better; hospitalized 04/21/2013)  . Arthritis     "minor in my right hand" (04/21/2013)  . Cancer     lung ca dx'd 03/2013  . History of radiation therapy 05/03/13-05/18/13    chest & whole brain   Past Surgical History  Procedure Laterality Date  . Inguinal hernia repair Right ~ 2005  . Finger surgery Left ~ 1989    "crushed end of my middle finger off"   . Video bronchoscopy Bilateral 04/26/2013    Procedure: VIDEO BRONCHOSCOPY WITH FLUORO;  Surgeon: Wilhelmina Mcardle, MD;  Location: [facility] ENDOSCOPY;  Service: Cardiopulmonary;  Laterality: Bilateral;   Family History  Problem Relation Age of Onset  . Breast cancer Sister   . Emphysema Mother   . Diabetes Father    History  Substance Use Topics  . Smoking status: Former Smoker -- 2.00 packs/day for 47 years    Types: Cigarettes    Quit date: 12/25/2011  . Smokeless tobacco: Never Used  . Alcohol Use: No     Comment: 04/21/2013 "used to drink a little bit; very little; nothing in years"    Review of Systems  All other systems reviewed and are  negative.     Allergies  Augmentin  Home Medications   Prior to Admission medications   Medication Sig Start Date End Date Taking? Authorizing Provider  albuterol (PROVENTIL HFA;VENTOLIN HFA) 108 (90 BASE) MCG/ACT inhaler Inhale 2 puffs into the lungs every 6 (six) hours as needed for wheezing or shortness of breath. 04/29/13  Yes Orson Eva, MD  dexamethasone (DECADRON) 4 MG tablet Take 4 mg by mouth. Take 2 tablets once daily the day after chemo then take 2 tablets twice daily for two days   Yes Historical Provider, MD  Docusate Sodium (DSS) 100 MG CAPS Take 200 mg by mouth See admin instructions. Daily  with meals 05/31/13  Yes Concha Norway, MD  LORazepam (ATIVAN) 0.5 MG tablet Take 0.5 mg by mouth 2 (two) times daily.   Yes Historical Provider, MD  mirtazapine (REMERON) 30 MG tablet Take 1 tablet (30 mg total) by mouth at bedtime. 08/03/13  Yes Concha Norway, MD  morphine (MS CONTIN) 30 MG 12 hr tablet Take 1 tablet (30 mg total) by mouth every 12 (twelve) hours. 07/21/13  Yes Concha Norway, MD  nystatin (MYCOSTATIN) 100000 UNIT/ML suspension Use as directed 5 mLs in the mouth or throat as needed (after chemo). Swish and Spit 5 ml 4 times daily 06/29/13  Yes Adrena E Johnson, PA-C  Omeprazole-Sodium Bicarbonate (  ZEGERID OTC PO) Take 1 capsule by mouth every morning.    Yes Historical Provider, MD  ondansetron (ZOFRAN) 8 MG tablet Take 8 mg by mouth every 8 (eight) hours as needed for nausea or vomiting.  06/02/13  Yes Historical Provider, MD  oxyCODONE (OXY IR/ROXICODONE) 5 MG immediate release tablet Take 1 tablet (5 mg total) by mouth every 4 (four) hours as needed for severe pain. 07/21/13  Yes Concha Norway, MD  prochlorperazine (COMPAZINE) 10 MG tablet Take 10 mg by mouth every 6 (six) hours as needed for nausea or vomiting.   Yes Historical Provider, MD  rivaroxaban (XARELTO) 20 MG TABS tablet Take 20 mg by mouth daily with supper.   Yes Historical Provider, MD  tiotropium (SPIRIVA HANDIHALER) 18 MCG  inhalation capsule Place 1 capsule (18 mcg total) into inhaler and inhale daily. 08/02/13  Yes Concha Norway, MD  polyethylene glycol powder (MIRALAX) powder Take 17 g by mouth daily. 08/20/13   Hoy Morn, MD  sodium phosphate Pediatric (FLEET) 3.5-9.5 GM/59ML enema Place 66 mLs (1 enema total) rectally once as needed for moderate constipation or severe constipation. 08/20/13   Hoy Morn, MD   BP 117/78  Pulse 86  Temp(Src) 98 F (36.7 C) (Oral)  Resp 17  SpO2 96% Physical Exam  Nursing note and vitals reviewed. Constitutional: He is oriented to person, place, and time. He appears well-developed and well-nourished.  HENT:  Head: Normocephalic and atraumatic.  Eyes: EOM are normal.  Neck: Normal range of motion.  Cardiovascular: Normal rate, regular rhythm, normal heart sounds and intact distal pulses.   Pulmonary/Chest: Effort normal and breath sounds normal. No respiratory distress.  Abdominal: Soft. He exhibits no distension. There is no tenderness.  Musculoskeletal: Normal range of motion.  Neurological: He is alert and oriented to person, place, and time.  Skin: Skin is warm and dry.  Psychiatric: He has a normal mood and affect. Judgment normal.    ED Course  Fecal disimpaction Date/Time: 08/20/2013 7:02 PM Performed by: Hoy Morn Authorized by: Hoy Morn Consent: Verbal consent obtained. Consent given by: patient Required items: required blood products, implants, devices, and special equipment available Local anesthesia used: no Patient sedated: no Patient tolerance: Patient tolerated the procedure well with no immediate complications. Comments: Digital disimpaction.  Hard brown stool removed   (including critical care time) Labs Review Labs Reviewed - No data to display  Imaging Review No results found.   EKG Interpretation None      MDM   Final diagnoses:  Constipation, unspecified constipation type  Fecal impaction    Disimpaction the  bedside.  Significant relief.  Patient be placed on MiraLAX and fleets enemas.    Hoy Morn, MD 08/20/13 [phone]

## 2013-08-20 NOTE — Discharge Instructions (Signed)

## 2013-08-20 NOTE — ED Notes (Signed)
Patient has not had a normal BM x 2 weeks according to the patient's wife. Patient takes 2-3 stool softeners daily, dulcolax x 2 tabs, dulcolax suppository, and miralax with only a small hard stool with a small amount of bright red blood on the stool. Patient has a history of stage 4 lung cancer,

## 2013-08-20 NOTE — ED Notes (Signed)
He states it has been about a week since he had b.m. (His wife states it's more like two weeks).  He states today with some straining, he had sm. "round pill" stool with some red blood on it.  He then tells me he has stage IV lung cancer.

## 2013-08-22 NOTE — Discharge Summary (Signed)
IVF hydration only.

## 2013-08-23 ENCOUNTER — Other Ambulatory Visit: Payer: Self-pay | Admitting: Medical Oncology

## 2013-08-23 ENCOUNTER — Telehealth: Payer: Self-pay | Admitting: Medical Oncology

## 2013-08-23 ENCOUNTER — Encounter: Payer: Self-pay | Admitting: Medical Oncology

## 2013-08-23 DIAGNOSIS — C3490 Malignant neoplasm of unspecified part of unspecified bronchus or lung: Secondary | ICD-10-CM

## 2013-08-23 DIAGNOSIS — K1230 Oral mucositis (ulcerative), unspecified: Secondary | ICD-10-CM

## 2013-08-23 MED ORDER — MORPHINE SULFATE ER 30 MG PO TBCR: 30.0000 mg | EXTENDED_RELEASE_TABLET | Freq: Two times a day (BID) | ORAL | Status: DC

## 2013-08-23 MED ORDER — OMEPRAZOLE-SODIUM BICARBONATE 40-1100 MG PO CAPS: 1.0000 | ORAL_CAPSULE | Freq: Every day | ORAL | Status: DC

## 2013-08-23 NOTE — Telephone Encounter (Signed)
Pt's wife called stating that pt did not get his disability check for the past 2 weeks. She called [employer] and they told her they faxed out office some papers but they have not received anything back. I refaxed the disability forms dating 04/21/13 thru the next 9-12 months. I asked them to fax [place] new forms if they needed an update.

## 2013-08-24 ENCOUNTER — Other Ambulatory Visit: Payer: Self-pay | Admitting: *Deleted

## 2013-08-30 ENCOUNTER — Telehealth: Payer: Self-pay

## 2013-08-30 ENCOUNTER — Telehealth: Payer: Self-pay | Admitting: *Deleted

## 2013-08-30 ENCOUNTER — Ambulatory Visit (HOSPITAL_COMMUNITY)
Admission: RE | Admit: 2013-08-30 | Discharge: 2013-08-30 | Disposition: A | Discharge status: Home / Self Care | Payer: BC Managed Care – PPO | Source: Ambulatory Visit | Attending: Radiation Oncology | Admitting: Radiation Oncology

## 2013-08-30 DIAGNOSIS — I6789 Other cerebrovascular disease: Secondary | ICD-10-CM | POA: Insufficient documentation

## 2013-08-30 DIAGNOSIS — C3490 Malignant neoplasm of unspecified part of unspecified bronchus or lung: Secondary | ICD-10-CM | POA: Insufficient documentation

## 2013-08-30 DIAGNOSIS — G319 Degenerative disease of nervous system, unspecified: Secondary | ICD-10-CM | POA: Insufficient documentation

## 2013-08-30 MED ORDER — GADOBENATE DIMEGLUMINE 529 MG/ML IV SOLN
10.0000 mL | Freq: Once | INTRAVENOUS | Status: AC | PRN
  Administered 2013-08-30: 10 mL via INTRAVENOUS

## 2013-08-30 NOTE — Telephone Encounter (Signed)
Per desk RN I have scheduled appt for 7/9

## 2013-08-30 NOTE — Telephone Encounter (Signed)
[name] called earlier asking for infusion/fluid appt on 7/9 after pt sees MD. This is per pt request. S/w [name] that the appt for fluids was made. Confirmed appt times with [name]

## 2013-09-01 ENCOUNTER — Other Ambulatory Visit: Payer: Self-pay | Admitting: Internal Medicine

## 2013-09-01 ENCOUNTER — Encounter: Payer: Self-pay | Admitting: Internal Medicine

## 2013-09-01 ENCOUNTER — Ambulatory Visit (HOSPITAL_BASED_OUTPATIENT_CLINIC_OR_DEPARTMENT_OTHER): Payer: BC Managed Care – PPO

## 2013-09-01 ENCOUNTER — Other Ambulatory Visit (HOSPITAL_BASED_OUTPATIENT_CLINIC_OR_DEPARTMENT_OTHER): Payer: BC Managed Care – PPO

## 2013-09-01 ENCOUNTER — Ambulatory Visit (HOSPITAL_BASED_OUTPATIENT_CLINIC_OR_DEPARTMENT_OTHER): Payer: BC Managed Care – PPO | Admitting: Internal Medicine

## 2013-09-01 VITALS — BP 101/87 | HR 87 | Temp 98.1°F | Resp 17 | Ht 67.0 in | Wt 97.7 lb

## 2013-09-01 DIAGNOSIS — C3490 Malignant neoplasm of unspecified part of unspecified bronchus or lung: Secondary | ICD-10-CM

## 2013-09-01 DIAGNOSIS — D6481 Anemia due to antineoplastic chemotherapy: Secondary | ICD-10-CM

## 2013-09-01 DIAGNOSIS — E860 Dehydration: Secondary | ICD-10-CM

## 2013-09-01 DIAGNOSIS — C7931 Secondary malignant neoplasm of brain: Secondary | ICD-10-CM

## 2013-09-01 DIAGNOSIS — C7949 Secondary malignant neoplasm of other parts of nervous system: Secondary | ICD-10-CM

## 2013-09-01 DIAGNOSIS — H9190 Unspecified hearing loss, unspecified ear: Secondary | ICD-10-CM

## 2013-09-01 DIAGNOSIS — K2970 Gastritis, unspecified, without bleeding: Secondary | ICD-10-CM

## 2013-09-01 DIAGNOSIS — D709 Neutropenia, unspecified: Secondary | ICD-10-CM

## 2013-09-01 DIAGNOSIS — K209 Esophagitis, unspecified without bleeding: Secondary | ICD-10-CM

## 2013-09-01 DIAGNOSIS — I959 Hypotension, unspecified: Secondary | ICD-10-CM

## 2013-09-01 DIAGNOSIS — J438 Other emphysema: Secondary | ICD-10-CM

## 2013-09-01 DIAGNOSIS — T451X5A Adverse effect of antineoplastic and immunosuppressive drugs, initial encounter: Secondary | ICD-10-CM

## 2013-09-01 DIAGNOSIS — E43 Unspecified severe protein-calorie malnutrition: Secondary | ICD-10-CM

## 2013-09-01 DIAGNOSIS — R630 Anorexia: Secondary | ICD-10-CM

## 2013-09-01 DIAGNOSIS — K2990 Gastroduodenitis, unspecified, without bleeding: Secondary | ICD-10-CM

## 2013-09-01 DIAGNOSIS — I2699 Other pulmonary embolism without acute cor pulmonale: Secondary | ICD-10-CM

## 2013-09-01 LAB — COMPREHENSIVE METABOLIC PANEL (CC13)
ALT: 21 U/L (ref 0–55)
ANION GAP: 10 meq/L (ref 3–11)
AST: 18 U/L (ref 5–34)
Albumin: 3.5 g/dL (ref 3.5–5.0)
Alkaline Phosphatase: 75 U/L (ref 40–150)
BUN: 14.2 mg/dL (ref 7.0–26.0)
CALCIUM: 9.7 mg/dL (ref 8.4–10.4)
CHLORIDE: 103 meq/L (ref 98–109)
CO2: 27 mEq/L (ref 22–29)
Creatinine: 0.8 mg/dL (ref 0.7–1.3)
GLUCOSE: 108 mg/dL (ref 70–140)
Potassium: 3.9 mEq/L (ref 3.5–5.1)
SODIUM: 140 meq/L (ref 136–145)
TOTAL PROTEIN: 6.8 g/dL (ref 6.4–8.3)
Total Bilirubin: 0.26 mg/dL (ref 0.20–1.20)

## 2013-09-01 LAB — CBC WITH DIFFERENTIAL/PLATELET
BASO%: 1 % (ref 0.0–2.0)
Basophils Absolute: 0.1 10*3/uL (ref 0.0–0.1)
EOS ABS: 0 10*3/uL (ref 0.0–0.5)
EOS%: 0.4 % (ref 0.0–7.0)
HCT: 33.6 % — ABNORMAL LOW (ref 38.4–49.9)
HEMOGLOBIN: 11.2 g/dL — AB (ref 13.0–17.1)
LYMPH#: 1 10*3/uL (ref 0.9–3.3)
LYMPH%: 13.1 % — ABNORMAL LOW (ref 14.0–49.0)
MCH: 32.9 pg (ref 27.2–33.4)
MCHC: 33.3 g/dL (ref 32.0–36.0)
MCV: 98.9 fL — ABNORMAL HIGH (ref 79.3–98.0)
MONO#: 1 10*3/uL — ABNORMAL HIGH (ref 0.1–0.9)
MONO%: 13 % (ref 0.0–14.0)
NEUT%: 72.5 % (ref 39.0–75.0)
NEUTROS ABS: 5.4 10*3/uL (ref 1.5–6.5)
Platelets: 309 10*3/uL (ref 140–400)
RBC: 3.4 10*6/uL — ABNORMAL LOW (ref 4.20–5.82)
RDW: 19.1 % — ABNORMAL HIGH (ref 11.0–14.6)
WBC: 7.4 10*3/uL (ref 4.0–10.3)

## 2013-09-01 LAB — LACTATE DEHYDROGENASE (CC13): LDH: 134 U/L (ref 125–245)

## 2013-09-01 MED ORDER — SODIUM CHLORIDE 0.9 % IV SOLN
Freq: Once | INTRAVENOUS | Status: AC
  Administered 2013-09-01: 17:00:00 via INTRAVENOUS

## 2013-09-01 MED ORDER — MIRTAZAPINE 30 MG PO TABS: 30.0000 mg | ORAL_TABLET | Freq: Every day | ORAL | Status: DC

## 2013-09-01 NOTE — Patient Instructions (Signed)
Dehydration, Adult Dehydration is when you lose more fluids from the body than you take in. Vital organs like the kidneys, brain, and heart cannot function without a proper amount of fluids and salt. Any loss of fluids from the body can cause dehydration.  CAUSES   Vomiting.  Diarrhea.  Excessive sweating.  Excessive urine output.  Fever. SYMPTOMS  Mild dehydration  Thirst.  Dry lips.  Slightly dry mouth. Moderate dehydration  Very dry mouth.  Sunken eyes.  Skin does not bounce back quickly when lightly pinched and released.  Dark urine and decreased urine production.  Decreased tear production.  Headache. Severe dehydration  Very dry mouth.  Extreme thirst.  Rapid, weak pulse (more than 100 beats per minute at rest).  Cold hands and feet.  Not able to sweat in spite of heat and temperature.  Rapid breathing.  Blue lips.  Confusion and lethargy.  Difficulty being awakened.  Minimal urine production.  No tears. DIAGNOSIS  Your caregiver will diagnose dehydration based on your symptoms and your exam. Blood and urine tests will help confirm the diagnosis. The diagnostic evaluation should also identify the cause of dehydration. TREATMENT  Treatment of mild or moderate dehydration can often be done at home by increasing the amount of fluids that you drink. It is best to drink small amounts of fluid more often. Drinking too much at one time can make vomiting worse. Refer to the home care instructions below. Severe dehydration needs to be treated at the hospital where you will probably be given intravenous (IV) fluids that contain water and electrolytes. HOME CARE INSTRUCTIONS   Ask your caregiver about specific rehydration instructions.  Drink enough fluids to keep your urine clear or pale yellow.  Drink small amounts frequently if you have nausea and vomiting.  Eat as you normally do.  Avoid:  Foods or drinks high in sugar.  Carbonated  drinks.  Juice.  Extremely hot or cold fluids.  Drinks with caffeine.  Fatty, greasy foods.  Alcohol.  Tobacco.  Overeating.  Gelatin desserts.  Wash your hands well to avoid spreading bacteria and viruses.  Only take over-the-counter or prescription medicines for pain, discomfort, or fever as directed by your caregiver.  Ask your caregiver if you should continue all prescribed and over-the-counter medicines.  Keep all follow-up appointments with your caregiver. SEEK MEDICAL CARE IF:  You have abdominal pain and it increases or stays in one area (localizes).  You have a rash, stiff neck, or severe headache.  You are irritable, sleepy, or difficult to awaken.  You are weak, dizzy, or extremely thirsty. SEEK IMMEDIATE MEDICAL CARE IF:   You are unable to keep fluids down or you get worse despite treatment.  You have frequent episodes of vomiting or diarrhea.  You have blood or green matter (bile) in your vomit.  You have blood in your stool or your stool looks black and tarry.  You have not urinated in 6 to 8 hours, or you have only urinated a small amount of very dark urine.  You have a fever.  You faint. MAKE SURE YOU:   Understand these instructions.  Will watch your condition.  Will get help right away if you are not doing well or get worse. Document Released: 02/10/2005 Document Revised: 05/05/2011 Document Reviewed: 09/30/2010 Exit[employer] Patient Information 2015 [employer]. This information is not intended to replace advice given to you by your health care provider. Make sure you discuss any questions you have with your health care   provider.  

## 2013-09-01 NOTE — Progress Notes (Signed)
Faxed clinical information to [employer] @ [phone] for patient's disability.

## 2013-09-01 NOTE — Progress Notes (Signed)
Wearing oxygen at 2L nasal cannula.

## 2013-09-01 NOTE — Progress Notes (Signed)
[facility] OFFICE PROGRESS NOTE  [name], [place] [address]  DIAGNOSIS: Small cell carcinoma of lung, unspecified laterality  Protein-calorie malnutrition, severe  Anorexia  Chief Complaint  Patient presents with  . Small cell carcinoma of lung    CURRENT TREATMENT: Cycle # 1 of Carboplatin (on day#1 of 260 mg)  plus etoposide (75 mg/m2 daily on 04/29, 04/30 and 5/1) started on 06/22/2013.  He completed neulasta on 06/25/2013.  Cycle #2 of Carboplatin (on day#1 of 260 mg)  plus etoposide (75 mg/m2 daily on 05/20, 05/21 and 5/22).  He completed neulasta on 07/16/2013.  He completed cycle 3 of carboplatin (on day 1 of 260 mg) plus etoposide (75 mg/m2 daily on 6/17, 6/18, and 6/19) and receipt of neulasta on 08/13/2013. We will plan 5 cycle of Carboplatin plus etoposide for a total therapy of 6 cycles per NCCN guidelines.   PRIOR TREATMENT:  WB XRT started on 03/10 - 03/25 per Dr. Pablo Ledger. He completed one cycle of cisplatin (79 mg/m2 on day #1) plus etoposide (100 mg/m2 on 3/25, 3/26 and 05/20/2013.  He is scheduled for cycle #4 of Carbo plus etoposide starting 09/07/2013.   INTERVAL HISTORY: Ricky Villanueva 63 y.o. male with a history of small cell lung cancer is here for follow up. He was last seen by me on 06/102015.  His past medical history also includes HTN/Hyperlipidemia and longstanding smoking history but quit 12/25/2011 presented to ER on 04/21/13 with worsening dyspnea, cough with streaks of blood over the past one week. In the ED, his oxygen saturation was 91% on 2 L nasal canula. CXR revealed a progressive increased density in the right lower lobe consistent with worsening atelectasis or pneumonia and a soft tissue fullness in the hilar region suspicious for lymphadenopathy or mass.and follow-up CT of chest was recommended. It revealed a nonocclusive left upper lobe, right lower lobe pulmonary emboli with right heart strain. Bulky  mediastinal and right hilar lymphadenopathy, encasing the of pulmonary arteries, pulmonary veins and bronchi, completely effacing right lower lobe segmental bronchi. Extensive consolidation in the right lung suggested postobstructive pneumonia, with interstitial prominence which may reflect superimposed lymphangitic spread of infection or neoplasm. He was admitted and started on antibiotics and heparin. Pulmonary was consulted and he had a video bronchoscopy on 03/03 with consistent with small cell ca of lung. 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 He had WBXRT as noted above. He then received his first cycle of chemotherapy on 03/25 thru 03/27 and was admitted 04/01 and discharged on 05/31/13 due to dehydration, acute renal failure, mucositis and subsequently had febrile neutropenia. We restarted the Xarelto which held due to profound thrombocytopenia.  Today, he is accompanied by his wife, daughter and his son. He reports feeling fatigued and lightheaded when standing. He reports improved appetite on remeron.  He reported to the emergency room on 6/27 with constipation and dehydration.    MEDICAL HISTORY: Past Medical History  Diagnosis Date  . Hyperlipidemia   . Hypertension   . GERD (gastroesophageal reflux disease)   . Respiratory failure with hypoxia 04/21/2013    secondary to pneumonia/notes 04/21/2013  . Pulmonary embolism     "got one in there now" (04/21/2013)  . COPD (chronic obstructive pulmonary disease)   . Pneumonia 2/?01/2014    "wouldn't get better; hospitalized 04/21/2013)  .  Arthritis     "minor in my right hand" (04/21/2013)  . Cancer     lung ca dx'd 03/2013  . History of radiation therapy 05/03/13-05/18/13    chest & whole brain    INTERIM HISTORY: has GERD (gastroesophageal reflux disease); HCAP (healthcare-associated pneumonia); Respiratory failure  with hypoxia; HTN (hypertension); Acute pulmonary embolism; Lung mass; Emphysema lung; Small cell carcinoma of lung; Acute renal failure; Protein-calorie malnutrition, severe; and Anorexia on his problem list.    ALLERGIES:  is allergic to decadron and augmentin.  MEDICATIONS: has a current medication list which includes the following prescription(s): albuterol, dss, lorazepam, mirtazapine, morphine, nystatin, omeprazole-sodium bicarbonate, ondansetron, oxycodone, polyethylene glycol powder, prochlorperazine, rivaroxaban, sodium phosphate pediatric, and tiotropium.  SURGICAL HISTORY:  Past Surgical History  Procedure Laterality Date  . Inguinal hernia repair Right ~ 2005  . Finger surgery Left ~ 1989    "crushed end of my middle finger off"   . Video bronchoscopy Bilateral 04/26/2013    Procedure: VIDEO BRONCHOSCOPY WITH FLUORO;  Surgeon: Wilhelmina Mcardle, MD;  Location: [facility] ENDOSCOPY;  Service: Cardiopulmonary;  Laterality: Bilateral;    REVIEW OF SYSTEMS:   Constitutional: Denies fevers, chills or abnormal weight loss Eyes: Denies blurriness of vision Ears, nose, mouth, throat, and face: Denies mucositis or sore throat Respiratory: Denies cough, dyspnea or wheezes Cardiovascular: Denies palpitation, chest discomfort or lower extremity swelling Gastrointestinal:  Denies nausea, heartburn or change in bowel habits Skin: Denies abnormal skin rashes Lymphatics: Denies new lymphadenopathy or easy bruising Neurological:Denies numbness, tingling or new weaknesses Behavioral/Psych: Mood is stable, no new changes  All other systems were reviewed with the patient and are negative.  PHYSICAL EXAMINATION: ECOG PERFORMANCE STATUS: 1 - Symptomatic but completely ambulatory  Blood pressure 101/87, pulse 87, temperature 98.1 F (36.7 C), temperature source Oral, resp. rate 17, height 5\' 7"  (1.702 m), weight 97 lb 11.2 oz (44.316 kg), SpO2 98.00%.  GENERAL:alert, no distress and comfortable; HOH;  chronically ill appearing, alopecia.  SKIN: skin color, texture, turgor are normal, no rashes or significant lesions EYES: normal, Conjunctiva are pink and non-injected, sclera clear OROPHARYNX:no exudate, no erythema and lips, buccal mucosa, and tongue normal  NECK: supple, thyroid normal size, non-tender, without nodularity LYMPH:  no palpable lymphadenopathy in the cervical, axillary or supraclavicular LUNGS: clear to auscultation with normal breathing effort, no wheezes or rhonchi HEART: regular rate & rhythm and no murmurs and no lower extremity edema ABDOMEN:abdomen soft, non-tender and normal bowel sounds Musculoskeletal:no cyanosis of digits and no clubbing  NEURO: alert & oriented x 3 with fluent speech, no focal motor/sensory deficits; Cranial nerves II - XII intact.  Negative rhomberge.  Strength 5/5 in upper and lower extremities bilaterally.  Gait, normal.   Labs:  Lab Results  Component Value Date   WBC 7.4 09/01/2013   HGB 11.2* 09/01/2013   HCT 33.6* 09/01/2013   MCV 98.9* 09/01/2013   PLT 309 09/01/2013   NEUTROABS 5.4 09/01/2013      Chemistry      Component Value Date/Time   NA 139 08/10/2013 0810   NA 139 08/01/2013 1101   K 4.4 08/10/2013 0810   K 4.0 08/01/2013 1101   CL 98 08/01/2013 1101   CO2 26 08/10/2013 0810   CO2 26 08/01/2013 1101   BUN 17.7 08/10/2013 0810   BUN 8 08/01/2013 1101   CREATININE 0.7 08/10/2013 0810   CREATININE 0.92 08/01/2013 1101      Component Value Date/Time   CALCIUM 9.5 08/10/2013 0810  CALCIUM 9.2 08/01/2013 1101   ALKPHOS 78 08/03/2013 1029   ALKPHOS 70 05/26/2013 0405   AST 10 08/03/2013 1029   AST 12 05/26/2013 0405   ALT 8 08/03/2013 1029   ALT 12 05/26/2013 0405   BILITOT 0.31 08/03/2013 1029   BILITOT 0.5 05/26/2013 0405     CBC:  Recent Labs Lab 09/01/13 1455  WBC 7.4  NEUTROABS 5.4  HGB 11.2*  HCT 33.6*  MCV 98.9*  PLT 309    Anemia work up No results found for this basename: VITAMINB12, FOLATE, FERRITIN, TIBC, IRON, RETICCTPCT,  in  the last 72 hours  Studies:  MRI of brain 08/30/2013 (Personally reviewed by me) IMPRESSION:  Complete resolution of the tiny areas of enhancement in the right parietal lobe and right paramedian brainstem. No mass effect or significant edema. Mild increase in small vessel disease versus post treatment effect.    ASSESSMENT: Ricky Villanueva 63 y.o. male with a history of Small cell carcinoma of lung, unspecified laterality  Protein-calorie malnutrition, severe  Anorexia   PLAN:  1. 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 Pablo Ledger completed whole brain XRT on 03/25. We started chemotherapy on 03/25 and he completed Cycle #1 On cisplatin 80 mg/m2 on day 1, etoposide 100 mg/m2 days 1,2,3. He had toxicity related to diminished hearing and hospitalization.  He has been evaluated by Dr. Constance Holster who also recommends hearing aides.    --Given his complains of hearing fullness and intermittent tinnitus, we discontinued cisplatin and substituted it with carboplatin AUC 3. In addition, we will decrease etoposide dose by 25% due to profound neutropenia. We will also continue neulasta of Day#4 of his chemotherapy each cycle due to febrile neutropenia. Plan continue chemotherapy in one week on 7/15 for his Cycle #4, day #1 of Carboplatin plus etoposide.  He still reports gastritis/esophagitis and anorexia. He will complete a total of 5 cycles of carboplatin plus etoposide (dose reduced).  He has already had one cycle of cisplatin plus etoposide as noted above.  NCCN guidelines support 4-6 cycles of induction therapy followed by observation.   --We obtained a CT of abdomen and pelvis and chest (06/09) given above symptoms to assess response to therapy following 2 cycles of therapy which demonstrated an positive interval response to therapy. In addition, Dr. Pablo Ledger ordered an MRI of brain which showed resolution of his brain metastases.   2. Gastritis/esophagitis.   --Continue zegerid OTC.    Seen by GI with a normal barium swallow.   3. Anorexia.  --Likely secondary to #1. He reports not tolerating marinol so we will prescribe Remeron 30 mg qhs instead. His appetite is improving on remeron.   4. PE (04/21/2013).  --CTA of chest noted above consistent with Nonocclusive left upper lobe, right lower lobe pulmonary emboli. Right heart strain. Continue xarelto 20 mg daily.   5. Anemia secondary to #1 plus/minus chemotherapy, symptomatic with fatigue and lightheadedness when standing.  --He is asymptomatic.  His hemoglobin is 11.4.   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 --We DISCONTINUED cisplatin and now patient is on carboplatin.  He has been fitted and received his hearing aids.   7. Dehydration and hypotension.  --We will continue Normal saline prn  8. Emphysema  --Continue spiriva hanihaler 18 mcg inhalation daily. Oxygen 2 liters by [place] prn for moderate exertion.   9. Follow-up.  --Patient will follow up 07/15  for consideration of chemotherapy with carbo plus etoposide cycle #4,  day #1.  He will follow up in 4 weeks for consideration of cycle #5, day #1 (last cycle).    All questions were answered. The patient knows to call the clinic with any problems, questions or concerns. We can certainly see the patient much sooner if necessary.  I spent 25 minutes counseling the patient face to face. The total time spent in the appointment was 40 minutes.    Kiyoko Mcguirt, MD 09/01/2013 3:33 PM

## 2013-09-02 ENCOUNTER — Telehealth: Payer: Self-pay | Admitting: *Deleted

## 2013-09-02 ENCOUNTER — Ambulatory Visit
Admission: RE | Admit: 2013-09-02 | Discharge: 2013-09-02 | Disposition: A | Discharge status: Home / Self Care | Payer: BC Managed Care – PPO | Source: Ambulatory Visit | Attending: Radiation Oncology | Admitting: Radiation Oncology

## 2013-09-02 ENCOUNTER — Other Ambulatory Visit: Payer: Self-pay | Admitting: Internal Medicine

## 2013-09-02 ENCOUNTER — Telehealth: Payer: Self-pay | Admitting: Internal Medicine

## 2013-09-02 ENCOUNTER — Telehealth: Payer: Self-pay

## 2013-09-02 VITALS — BP 108/77 | HR 73 | Temp 97.8°F | Resp 18 | Wt 102.0 lb

## 2013-09-02 DIAGNOSIS — C3490 Malignant neoplasm of unspecified part of unspecified bronchus or lung: Secondary | ICD-10-CM

## 2013-09-02 NOTE — Progress Notes (Signed)
Patient for routine follow up completion of radiation to chest and brain on 05/18/13.Completed cycle 3 of 6 of carboplatin and etoposide.on 08/10/13.Scheduled for cycle 4 on 09/07/13.MRI post radiation performed on 08/30/13 reveals complete resolution of tiny areas of enhancement in the right parietal lobe and right paramedian brainstem.Denies pain.Shortness of breath unchanged.Continues to wear 2L oxygen.

## 2013-09-02 NOTE — Telephone Encounter (Signed)
Per staff phone call and POF I have schedueld appts. Scheduler advised of appts.  JMW  

## 2013-09-02 NOTE — Telephone Encounter (Signed)
S/w [name] several times. Pt wants to go ahead and have port a cath placed. Dr Juliann Mule aware and ordered pac. S/w bonnie to expect call from IR scheduler.

## 2013-09-02 NOTE — Telephone Encounter (Signed)
Gave pt appt for lab,md and chemo for July and August 2015

## 2013-09-02 NOTE — Progress Notes (Signed)
Department of Radiation Oncology  Phone:  [phone] Fax:        [phone]   Name: Ricky Villanueva MRN: [id number]  DOB: 1950/09/04  Date: 09/02/2013  Follow Up Visit Note  Diagnosis: Extensive Stage Small Cell Lung Cancer  Summary and Interval since last radiation: 30 gy in 12 fractions to chest and brain completed 3/25/215  Interval History: Ricky Villanueva presents today for routine followup.  He is scheduled for his final chemotherapy next week then a re-evaluation scan.  He is feeling better and gaining weight. He had fluids yesterday and is struggling to keep up with fluid requirements. He is going to have a porta cath placed. He denies headaches or visual change.His tinnitus has resolved. He had a brain MRI which showed regression of all treated disease. He still has diminished taste.   Allergies:  Allergies  Allergen Reactions  . Decadron [Dexamethasone]     Slurred speech  . Augmentin [Amoxicillin-Pot Clavulanate] Itching and Rash    rash    Medications:  Current Outpatient Prescriptions  Medication Sig Dispense Refill  . albuterol (PROVENTIL HFA;VENTOLIN HFA) 108 (90 BASE) MCG/ACT inhaler Inhale 2 puffs into the lungs every 6 (six) hours as needed for wheezing or shortness of breath.  1 Inhaler  2  . Docusate Sodium (DSS) 100 MG CAPS Take 200 mg by mouth See admin instructions. Daily  with meals      . LORazepam (ATIVAN) 0.5 MG tablet Take 0.5 mg by mouth 2 (two) times daily.      . mirtazapine (REMERON) 30 MG tablet Take 1 tablet (30 mg total) by mouth at bedtime.  90 tablet  1  . morphine (MS CONTIN) 30 MG 12 hr tablet Take 1 tablet (30 mg total) by mouth every 12 (twelve) hours.  60 tablet  0  . nystatin (MYCOSTATIN) 100000 UNIT/ML suspension Use as directed 5 mLs in the mouth or throat as needed (after chemo). Swish and Spit 5 ml 4 times daily      . omeprazole-sodium bicarbonate (ZEGERID) 40-1100 MG per capsule Take 1 capsule by mouth daily before breakfast.  30 capsule   1  . ondansetron (ZOFRAN) 8 MG tablet Take 8 mg by mouth every 8 (eight) hours as needed for nausea or vomiting.       [name] oxyCODONE (OXY IR/ROXICODONE) 5 MG immediate release tablet Take 1 tablet (5 mg total) by mouth every 4 (four) hours as needed for severe pain.  60 tablet  0  . polyethylene glycol powder (MIRALAX) powder Take 17 g by mouth daily.  255 g  0  . prochlorperazine (COMPAZINE) 10 MG tablet Take 10 mg by mouth every 6 (six) hours as needed for nausea or vomiting.      . rivaroxaban (XARELTO) 20 MG TABS tablet Take 20 mg by mouth daily with supper.      . sodium phosphate Pediatric (FLEET) 3.5-9.5 GM/59ML enema Place 66 mLs (1 enema total) rectally once as needed for moderate constipation or severe constipation.  66.6 mL  6  . tiotropium (SPIRIVA HANDIHALER) 18 MCG inhalation capsule Place 1 capsule (18 mcg total) into inhaler and inhale daily.  90 capsule  1   No current facility-administered medications for this encounter.    Physical Exam:  Filed Vitals:   09/02/13 1450  BP: 108/77  Pulse: 73  Temp: 97.8 F (36.6 C)  Resp: 18  Weight: 102 lb (46.267 kg)  SpO2: 99%   Appears cachetic but alert. Normal respiratory effort.  Nasal cannula in place.   IMPRESSION: Ricky Villanueva is a 63 y.o. male s/p palliative RT to the chest and brain.   PLAN:  Doing well. RTC in 3 months with repeat brain MRI. Encouraged PO intake.     [name], MD

## 2013-09-02 NOTE — Telephone Encounter (Signed)
Tiffany called to ask if it was OK for pt to hold xarelto for 24 hour prior to port placement. Per Dr Juliann Mule it is OK. Tiffany will tell the pt when she schedules port placement.

## 2013-09-05 ENCOUNTER — Other Ambulatory Visit: Payer: Self-pay | Admitting: Medical Oncology

## 2013-09-05 ENCOUNTER — Telehealth: Payer: Self-pay | Admitting: *Deleted

## 2013-09-05 ENCOUNTER — Encounter (HOSPITAL_COMMUNITY): Payer: Self-pay | Admitting: Pharmacy Technician

## 2013-09-05 ENCOUNTER — Telehealth: Payer: Self-pay | Admitting: Internal Medicine

## 2013-09-05 DIAGNOSIS — C3491 Malignant neoplasm of unspecified part of right bronchus or lung: Secondary | ICD-10-CM

## 2013-09-05 MED ORDER — SODIUM CHLORIDE 0.9 % IV SOLN: 1000.0000 mL | Freq: Once | INTRAVENOUS | Status: AC

## 2013-09-05 NOTE — Telephone Encounter (Signed)
CALLED PATIENT TO INFORM OF MRI ON 12-05-13- ARRIVAL TIME - 12:45  @ [facility] AND FU TO GET RESULTS ON 12-08-13 @ 2:20 PM WITH DR. Pablo Ledger, SPOKE WITH PATIENT'S WIFE BONNIE AND THEY ARE AWARE OF THESE APPTS.

## 2013-09-05 NOTE — Telephone Encounter (Signed)
Sent michelle a staff message to add the pt in for iv fluids this coming sat.

## 2013-09-06 ENCOUNTER — Encounter: Payer: Self-pay | Admitting: Medical Oncology

## 2013-09-06 ENCOUNTER — Other Ambulatory Visit: Payer: Self-pay | Admitting: Medical Oncology

## 2013-09-06 ENCOUNTER — Telehealth: Payer: Self-pay | Admitting: *Deleted

## 2013-09-06 MED ORDER — LIDOCAINE-PRILOCAINE 2.5-2.5 % EX KIT: PACK | CUTANEOUS | Status: DC

## 2013-09-06 NOTE — Telephone Encounter (Signed)
Per staff message and [facility] I have scheduled appts. Advised scheduler of appts. JMW  

## 2013-09-07 ENCOUNTER — Other Ambulatory Visit: Payer: Self-pay | Admitting: *Deleted

## 2013-09-07 ENCOUNTER — Other Ambulatory Visit: Payer: Self-pay | Admitting: Internal Medicine

## 2013-09-07 ENCOUNTER — Other Ambulatory Visit: Payer: Self-pay | Admitting: Radiology

## 2013-09-07 ENCOUNTER — Other Ambulatory Visit: Payer: Self-pay | Admitting: Medical Oncology

## 2013-09-07 ENCOUNTER — Ambulatory Visit (HOSPITAL_BASED_OUTPATIENT_CLINIC_OR_DEPARTMENT_OTHER): Payer: BC Managed Care – PPO

## 2013-09-07 VITALS — BP 112/72 | HR 71 | Temp 98.6°F | Resp 18

## 2013-09-07 DIAGNOSIS — C3491 Malignant neoplasm of unspecified part of right bronchus or lung: Secondary | ICD-10-CM

## 2013-09-07 DIAGNOSIS — C3490 Malignant neoplasm of unspecified part of unspecified bronchus or lung: Secondary | ICD-10-CM

## 2013-09-07 DIAGNOSIS — C7A1 Malignant poorly differentiated neuroendocrine tumors: Secondary | ICD-10-CM

## 2013-09-07 DIAGNOSIS — Z5111 Encounter for antineoplastic chemotherapy: Secondary | ICD-10-CM

## 2013-09-07 DIAGNOSIS — C7B8 Other secondary neuroendocrine tumors: Secondary | ICD-10-CM

## 2013-09-07 LAB — CBC WITH DIFFERENTIAL/PLATELET
BASO%: 0.5 % (ref 0.0–2.0)
Basophils Absolute: 0 10*3/uL (ref 0.0–0.1)
EOS%: 1.2 % (ref 0.0–7.0)
Eosinophils Absolute: 0.1 10*3/uL (ref 0.0–0.5)
HCT: 37.1 % — ABNORMAL LOW (ref 38.4–49.9)
HGB: 12.1 g/dL — ABNORMAL LOW (ref 13.0–17.1)
LYMPH%: 5.8 % — ABNORMAL LOW (ref 14.0–49.0)
MCH: 32.6 pg (ref 27.2–33.4)
MCHC: 32.6 g/dL (ref 32.0–36.0)
MCV: 99.9 fL — AB (ref 79.3–98.0)
MONO#: 0.8 10*3/uL (ref 0.1–0.9)
MONO%: 8.5 % (ref 0.0–14.0)
NEUT#: 7.8 10*3/uL — ABNORMAL HIGH (ref 1.5–6.5)
NEUT%: 84 % — ABNORMAL HIGH (ref 39.0–75.0)
Platelets: 371 10*3/uL (ref 140–400)
RBC: 3.71 10*6/uL — AB (ref 4.20–5.82)
RDW: 19.5 % — AB (ref 11.0–14.6)
WBC: 9.2 10*3/uL (ref 4.0–10.3)
lymph#: 0.5 10*3/uL — ABNORMAL LOW (ref 0.9–3.3)

## 2013-09-07 LAB — COMPREHENSIVE METABOLIC PANEL (CC13)
ALBUMIN: 3.9 g/dL (ref 3.5–5.0)
ALT: 21 U/L (ref 0–55)
AST: 20 U/L (ref 5–34)
Alkaline Phosphatase: 81 U/L (ref 40–150)
Anion Gap: 11 mEq/L (ref 3–11)
BUN: 13.1 mg/dL (ref 7.0–26.0)
CO2: 27 mEq/L (ref 22–29)
Calcium: 10 mg/dL (ref 8.4–10.4)
Chloride: 100 mEq/L (ref 98–109)
Creatinine: 0.8 mg/dL (ref 0.7–1.3)
Glucose: 108 mg/dl (ref 70–140)
POTASSIUM: 3.9 meq/L (ref 3.5–5.1)
SODIUM: 138 meq/L (ref 136–145)
TOTAL PROTEIN: 7.6 g/dL (ref 6.4–8.3)
Total Bilirubin: 0.32 mg/dL (ref 0.20–1.20)

## 2013-09-07 MED ORDER — LORAZEPAM 0.5 MG PO TABS: 0.5000 mg | ORAL_TABLET | Freq: Two times a day (BID) | ORAL | Status: DC

## 2013-09-07 MED ORDER — ONDANSETRON 16 MG/50ML IVPB (CHCC)
16.0000 mg | Freq: Once | INTRAVENOUS | Status: AC
  Administered 2013-09-07: 16 mg via INTRAVENOUS

## 2013-09-07 MED ORDER — OXYCODONE HCL 5 MG PO TABS: 5.0000 mg | ORAL_TABLET | ORAL | Status: DC | PRN

## 2013-09-07 MED ORDER — SODIUM CHLORIDE 0.9 % IV SOLN
260.0000 mg | Freq: Once | INTRAVENOUS | Status: AC
  Administered 2013-09-07: 260 mg via INTRAVENOUS
  Filled 2013-09-07: qty 26

## 2013-09-07 MED ORDER — SODIUM CHLORIDE 0.9 % IV SOLN
Freq: Once | INTRAVENOUS | Status: AC
  Administered 2013-09-07: 11:00:00 via INTRAVENOUS

## 2013-09-07 MED ORDER — SODIUM CHLORIDE 0.9 % IV SOLN
75.0000 mg/m2 | Freq: Once | INTRAVENOUS | Status: AC
  Administered 2013-09-07: 120 mg via INTRAVENOUS
  Filled 2013-09-07: qty 6

## 2013-09-07 MED ORDER — ONDANSETRON 16 MG/50ML IVPB (CHCC)
INTRAVENOUS | Status: AC
  Filled 2013-09-07: qty 16

## 2013-09-07 NOTE — Patient Instructions (Signed)
[facility] Discharge Instructions for Patients Receiving Chemotherapy  Today you received the following chemotherapy agents Carboplatin/Etoposide.  To help prevent nausea and vomiting after your treatment, we encourage you to take your nausea medication as directed.    If you develop nausea and vomiting that is not controlled by your nausea medication, call the clinic.   BELOW ARE SYMPTOMS THAT SHOULD BE REPORTED IMMEDIATELY:  *FEVER GREATER THAN 100.5 F  *CHILLS WITH OR WITHOUT FEVER  NAUSEA AND VOMITING THAT IS NOT CONTROLLED WITH YOUR NAUSEA MEDICATION  *UNUSUAL SHORTNESS OF BREATH  *UNUSUAL BRUISING OR BLEEDING  TENDERNESS IN MOUTH AND THROAT WITH OR WITHOUT PRESENCE OF ULCERS  *URINARY PROBLEMS  *BOWEL PROBLEMS  UNUSUAL RASH Items with * indicate a potential emergency and should be followed up as soon as possible.  Feel free to call the clinic you have any questions or concerns. The clinic phone number is [phone].

## 2013-09-08 ENCOUNTER — Ambulatory Visit (HOSPITAL_BASED_OUTPATIENT_CLINIC_OR_DEPARTMENT_OTHER): Payer: BC Managed Care – PPO

## 2013-09-08 VITALS — BP 129/72 | HR 75 | Temp 97.9°F | Resp 18

## 2013-09-08 DIAGNOSIS — C3491 Malignant neoplasm of unspecified part of right bronchus or lung: Secondary | ICD-10-CM

## 2013-09-08 DIAGNOSIS — C7B8 Other secondary neuroendocrine tumors: Secondary | ICD-10-CM

## 2013-09-08 DIAGNOSIS — C7A1 Malignant poorly differentiated neuroendocrine tumors: Secondary | ICD-10-CM

## 2013-09-08 DIAGNOSIS — Z5111 Encounter for antineoplastic chemotherapy: Secondary | ICD-10-CM

## 2013-09-08 MED ORDER — SODIUM CHLORIDE 0.9 % IV SOLN
75.0000 mg/m2 | Freq: Once | INTRAVENOUS | Status: AC
  Administered 2013-09-08: 120 mg via INTRAVENOUS
  Filled 2013-09-08: qty 6

## 2013-09-08 MED ORDER — PROCHLORPERAZINE MALEATE 10 MG PO TABS
10.0000 mg | ORAL_TABLET | Freq: Once | ORAL | Status: AC
  Administered 2013-09-08: 10 mg via ORAL

## 2013-09-08 MED ORDER — SODIUM CHLORIDE 0.9 % IV SOLN
Freq: Once | INTRAVENOUS | Status: AC
  Administered 2013-09-08: 15:00:00 via INTRAVENOUS

## 2013-09-08 MED ORDER — PROCHLORPERAZINE MALEATE 10 MG PO TABS
ORAL_TABLET | ORAL | Status: AC
  Filled 2013-09-08: qty 1

## 2013-09-08 NOTE — Patient Instructions (Signed)
[facility] Discharge Instructions for Patients Receiving Chemotherapy  Today you received the following chemotherapy agents Etoposide.   To help prevent nausea and vomiting after your treatment, we encourage you to take your nausea medication as directed.    If you develop nausea and vomiting that is not controlled by your nausea medication, call the clinic.   BELOW ARE SYMPTOMS THAT SHOULD BE REPORTED IMMEDIATELY:  *FEVER GREATER THAN 100.5 F  *CHILLS WITH OR WITHOUT FEVER  NAUSEA AND VOMITING THAT IS NOT CONTROLLED WITH YOUR NAUSEA MEDICATION  *UNUSUAL SHORTNESS OF BREATH  *UNUSUAL BRUISING OR BLEEDING  TENDERNESS IN MOUTH AND THROAT WITH OR WITHOUT PRESENCE OF ULCERS  *URINARY PROBLEMS  *BOWEL PROBLEMS  UNUSUAL RASH Items with * indicate a potential emergency and should be followed up as soon as possible.  Feel free to call the clinic you have any questions or concerns. The clinic phone number is [phone].

## 2013-09-09 ENCOUNTER — Other Ambulatory Visit: Payer: Self-pay | Admitting: Internal Medicine

## 2013-09-09 ENCOUNTER — Ambulatory Visit (HOSPITAL_BASED_OUTPATIENT_CLINIC_OR_DEPARTMENT_OTHER): Payer: BC Managed Care – PPO

## 2013-09-09 ENCOUNTER — Ambulatory Visit (HOSPITAL_COMMUNITY)
Admission: RE | Admit: 2013-09-09 | Discharge: 2013-09-09 | Disposition: A | Discharge status: Home / Self Care | Payer: BC Managed Care – PPO | Source: Ambulatory Visit | Attending: Internal Medicine | Admitting: Internal Medicine

## 2013-09-09 VITALS — BP 118/80 | HR 79 | Temp 98.3°F | Resp 18

## 2013-09-09 DIAGNOSIS — C3490 Malignant neoplasm of unspecified part of unspecified bronchus or lung: Secondary | ICD-10-CM

## 2013-09-09 DIAGNOSIS — C7A1 Malignant poorly differentiated neuroendocrine tumors: Secondary | ICD-10-CM

## 2013-09-09 DIAGNOSIS — Z5111 Encounter for antineoplastic chemotherapy: Secondary | ICD-10-CM

## 2013-09-09 DIAGNOSIS — C3491 Malignant neoplasm of unspecified part of right bronchus or lung: Secondary | ICD-10-CM

## 2013-09-09 DIAGNOSIS — C7B8 Other secondary neuroendocrine tumors: Secondary | ICD-10-CM

## 2013-09-09 MED ORDER — SODIUM CHLORIDE 0.9 % IV SOLN: INTRAVENOUS | Status: DC

## 2013-09-09 MED ORDER — LIDOCAINE HCL 1 % IJ SOLN
INTRAMUSCULAR | Status: AC
  Filled 2013-09-09: qty 20

## 2013-09-09 MED ORDER — PROCHLORPERAZINE MALEATE 10 MG PO TABS
ORAL_TABLET | ORAL | Status: AC
  Filled 2013-09-09: qty 1

## 2013-09-09 MED ORDER — SODIUM CHLORIDE 0.9 % IV SOLN
Freq: Once | INTRAVENOUS | Status: AC
  Administered 2013-09-09: 12:00:00 via INTRAVENOUS

## 2013-09-09 MED ORDER — ETOPOSIDE CHEMO INJECTION 1 GM/50ML
75.0000 mg/m2 | Freq: Once | INTRAVENOUS | Status: AC
  Administered 2013-09-09: 120 mg via INTRAVENOUS
  Filled 2013-09-09: qty 6

## 2013-09-09 MED ORDER — PROCHLORPERAZINE MALEATE 10 MG PO TABS
10.0000 mg | ORAL_TABLET | Freq: Once | ORAL | Status: AC
  Administered 2013-09-09: 10 mg via ORAL

## 2013-09-09 MED ORDER — VANCOMYCIN HCL IN DEXTROSE 1-5 GM/200ML-% IV SOLN: 1000.0000 mg | Freq: Once | INTRAVENOUS | Status: DC

## 2013-09-09 NOTE — Patient Instructions (Addendum)
[facility] Discharge Instructions for Patients Receiving Chemotherapy  Today you received the following chemotherapy agents: Etoposide  To help prevent nausea and vomiting after your treatment, we encourage you to take your nausea medication as prescribed by your physician.    If you develop nausea and vomiting that is not controlled by your nausea medication, call the clinic.   BELOW ARE SYMPTOMS THAT SHOULD BE REPORTED IMMEDIATELY:  *FEVER GREATER THAN 100.5 F  *CHILLS WITH OR WITHOUT FEVER  NAUSEA AND VOMITING THAT IS NOT CONTROLLED WITH YOUR NAUSEA MEDICATION  *UNUSUAL SHORTNESS OF BREATH  *UNUSUAL BRUISING OR BLEEDING  TENDERNESS IN MOUTH AND THROAT WITH OR WITHOUT PRESENCE OF ULCERS  *URINARY PROBLEMS  *BOWEL PROBLEMS  UNUSUAL RASH Items with * indicate a potential emergency and should be followed up as soon as possible.  Feel free to call the clinic you have any questions or concerns. The clinic phone number is [phone].  Dehydration, Adult Dehydration means your body does not have as much fluid as it needs. Your kidneys, brain, and heart will not work properly without the right amount of fluids and salt.  HOME CARE  Ask your doctor how to replace body fluid losses (rehydrate).  Drink enough fluids to keep your pee (urine) clear or pale yellow.  Drink small amounts of fluids often if you feel sick to your stomach (nauseous) or throw up (vomit).  Eat like you normally do.  Avoid:  Foods or drinks high in sugar.  Bubbly (carbonated) drinks.  Juice.  Very hot or cold fluids.  Drinks with caffeine.  Fatty, greasy foods.  Alcohol.  Tobacco.  Eating too much.  Gelatin desserts.  Wash your hands to avoid spreading germs (bacteria, viruses).  Only take medicine as told by your doctor.  Keep all doctor visits as told. GET HELP RIGHT AWAY IF:   You cannot drink something without throwing up.  You get worse even with  treatment.  Your vomit has blood in it or looks greenish.  Your poop (stool) has blood in it or looks black and tarry.  You have not peed in 6 to 8 hours.  You pee a small amount of very dark pee.  You have a fever.  You pass out (faint).  You have belly (abdominal) pain that gets worse or stays in one spot (localizes).  You have a rash, stiff neck, or bad headache.  You get easily annoyed, sleepy, or are hard to wake up.  You feel weak, dizzy, or very thirsty. MAKE SURE YOU:   Understand these instructions.  Will watch your condition.  Will get help right away if you are not doing well or get worse. Document Released: 12/07/2008 Document Revised: 05/05/2011 Document Reviewed: 09/30/2010 [facility] Patient Information 2015 [place], [place]. This information is not intended to replace advice given to you by your health care provider. Make sure you discuss any questions you have with your health care provider.

## 2013-09-09 NOTE — Progress Notes (Signed)
Notified Dr Pascal Lux patient had taken Xarelto yesterday eve

## 2013-09-09 NOTE — Procedures (Signed)
Successful US guided right arm 4 french venous catheter placement. No immediate complications, sterile dressing applied, may use for chemotherapy today. Patient to return next week for port a catheter placement.   Tsosie Billing PA-C Interventional Radiology  09/09/13  10:48 AM

## 2013-09-09 NOTE — Progress Notes (Signed)
Patient was able to received IVFs today instead of tomorrow, knows to come tomorrow for neulasta injection.

## 2013-09-10 ENCOUNTER — Ambulatory Visit: Payer: BC Managed Care – PPO

## 2013-09-10 ENCOUNTER — Ambulatory Visit (HOSPITAL_BASED_OUTPATIENT_CLINIC_OR_DEPARTMENT_OTHER): Payer: BC Managed Care – PPO

## 2013-09-10 VITALS — BP 101/75 | HR 87 | Temp 97.6°F | Resp 18

## 2013-09-10 DIAGNOSIS — C3491 Malignant neoplasm of unspecified part of right bronchus or lung: Secondary | ICD-10-CM

## 2013-09-10 DIAGNOSIS — Z5189 Encounter for other specified aftercare: Secondary | ICD-10-CM

## 2013-09-10 DIAGNOSIS — C7A1 Malignant poorly differentiated neuroendocrine tumors: Secondary | ICD-10-CM

## 2013-09-10 DIAGNOSIS — C7B8 Other secondary neuroendocrine tumors: Secondary | ICD-10-CM

## 2013-09-10 MED ORDER — PEGFILGRASTIM INJECTION 6 MG/0.6ML
6.0000 mg | Freq: Once | SUBCUTANEOUS | Status: AC
  Administered 2013-09-10: 6 mg via SUBCUTANEOUS

## 2013-09-10 NOTE — Patient Instructions (Signed)

## 2013-09-12 ENCOUNTER — Other Ambulatory Visit: Payer: Self-pay | Admitting: Internal Medicine

## 2013-09-12 ENCOUNTER — Other Ambulatory Visit: Payer: Self-pay | Admitting: Medical Oncology

## 2013-09-12 DIAGNOSIS — I878 Other specified disorders of veins: Secondary | ICD-10-CM

## 2013-09-12 DIAGNOSIS — C3490 Malignant neoplasm of unspecified part of unspecified bronchus or lung: Secondary | ICD-10-CM

## 2013-09-12 MED ORDER — SODIUM CHLORIDE 0.9 % IV SOLN: Freq: Once | INTRAVENOUS | Status: AC

## 2013-09-13 ENCOUNTER — Telehealth: Payer: Self-pay

## 2013-09-13 NOTE — Telephone Encounter (Signed)
S/w [name] that pt has appt at 0830 for fluids. LVM on pts answering machine with same.

## 2013-09-14 ENCOUNTER — Ambulatory Visit (HOSPITAL_BASED_OUTPATIENT_CLINIC_OR_DEPARTMENT_OTHER): Payer: BC Managed Care – PPO

## 2013-09-14 VITALS — BP 101/64 | HR 64 | Temp 94.5°F

## 2013-09-14 DIAGNOSIS — C7B8 Other secondary neuroendocrine tumors: Secondary | ICD-10-CM

## 2013-09-14 DIAGNOSIS — C3490 Malignant neoplasm of unspecified part of unspecified bronchus or lung: Secondary | ICD-10-CM

## 2013-09-14 DIAGNOSIS — E860 Dehydration: Secondary | ICD-10-CM

## 2013-09-14 DIAGNOSIS — C7A1 Malignant poorly differentiated neuroendocrine tumors: Secondary | ICD-10-CM

## 2013-09-14 MED ORDER — SODIUM CHLORIDE 0.9 % IV SOLN
Freq: Once | INTRAVENOUS | Status: AC
  Administered 2013-09-14: 09:00:00 via INTRAVENOUS

## 2013-09-14 NOTE — Patient Instructions (Signed)

## 2013-09-15 ENCOUNTER — Other Ambulatory Visit: Payer: Self-pay | Admitting: Radiology

## 2013-09-15 ENCOUNTER — Encounter (HOSPITAL_COMMUNITY): Payer: Self-pay | Admitting: Pharmacy Technician

## 2013-09-19 ENCOUNTER — Ambulatory Visit (HOSPITAL_COMMUNITY)
Admission: RE | Admit: 2013-09-19 | Discharge: 2013-09-19 | Disposition: A | Discharge status: Home / Self Care | Payer: BC Managed Care – PPO | Source: Ambulatory Visit | Attending: Internal Medicine | Admitting: Internal Medicine

## 2013-09-19 ENCOUNTER — Telehealth: Payer: Self-pay | Admitting: *Deleted

## 2013-09-19 ENCOUNTER — Encounter (HOSPITAL_COMMUNITY): Payer: Self-pay

## 2013-09-19 VITALS — BP 128/74 | HR 77 | Temp 97.6°F | Resp 18

## 2013-09-19 DIAGNOSIS — I878 Other specified disorders of veins: Secondary | ICD-10-CM

## 2013-09-19 DIAGNOSIS — C3490 Malignant neoplasm of unspecified part of unspecified bronchus or lung: Secondary | ICD-10-CM

## 2013-09-19 DIAGNOSIS — Z5309 Procedure and treatment not carried out because of other contraindication: Secondary | ICD-10-CM | POA: Insufficient documentation

## 2013-09-19 DIAGNOSIS — D696 Thrombocytopenia, unspecified: Secondary | ICD-10-CM | POA: Insufficient documentation

## 2013-09-19 LAB — CBC WITH DIFFERENTIAL/PLATELET
BASOS ABS: 0 10*3/uL (ref 0.0–0.1)
Basophils Relative: 0 % (ref 0–1)
Eosinophils Absolute: 0 10*3/uL (ref 0.0–0.7)
Eosinophils Relative: 2 % (ref 0–5)
HEMATOCRIT: 28.2 % — AB (ref 39.0–52.0)
Hemoglobin: 9.7 g/dL — ABNORMAL LOW (ref 13.0–17.0)
Lymphocytes Relative: 17 % (ref 12–46)
Lymphs Abs: 0.4 10*3/uL — ABNORMAL LOW (ref 0.7–4.0)
MCH: 32.8 pg (ref 26.0–34.0)
MCHC: 34.4 g/dL (ref 30.0–36.0)
MCV: 95.3 fL (ref 78.0–100.0)
Monocytes Absolute: 0.5 10*3/uL (ref 0.1–1.0)
Monocytes Relative: 18 % — ABNORMAL HIGH (ref 3–12)
Neutro Abs: 1.6 10*3/uL — ABNORMAL LOW (ref 1.7–7.7)
Neutrophils Relative %: 63 % (ref 43–77)
Platelets: 53 10*3/uL — ABNORMAL LOW (ref 150–400)
RBC: 2.96 MIL/uL — ABNORMAL LOW (ref 4.22–5.81)
RDW: 14.9 % (ref 11.5–15.5)
WBC: 2.6 10*3/uL — ABNORMAL LOW (ref 4.0–10.5)

## 2013-09-19 LAB — PROTIME-INR
INR: 1.07 (ref 0.00–1.49)
Prothrombin Time: 13.9 seconds (ref 11.6–15.2)

## 2013-09-19 LAB — APTT: aPTT: 32 seconds (ref 24–37)

## 2013-09-19 MED ORDER — SODIUM CHLORIDE 0.9 % IV SOLN
INTRAVENOUS | Status: DC
Start: 2013-09-19 — End: 2013-09-20

## 2013-09-19 MED ORDER — VANCOMYCIN HCL IN DEXTROSE 1-5 GM/200ML-% IV SOLN: 1000.0000 mg | INTRAVENOUS | Status: DC

## 2013-09-19 MED ORDER — SODIUM CHLORIDE 0.9 % IV SOLN
Freq: Once | INTRAVENOUS | Status: AC
  Administered 2013-09-19: 12:00:00 via INTRAVENOUS

## 2013-09-19 NOTE — Progress Notes (Signed)
Patient ID: Ricky Villanueva, male   DOB: [birth year], 63 y.o.   MRN: [id number] Pt's PAC placement originally scheduled for today has been postponed until 8/5 secondary to thrombocytopenia (most likely related to recent chemo). Above d/w Drs. Chism/Hassell and pt/family.

## 2013-09-19 NOTE — Telephone Encounter (Signed)
Daughter Clover Mealy called and left message wanting to know when pt is scheduled for CT scan before next office visit.  From Dr. Boyce Medici last office notes on 09/01/13 -  Pt to have office visit and last cycle of chemo in 4 weeks.   Called [name] back and informed her of md's plan.  [name] voiced understanding. [name]'s  Phone   [phone].

## 2013-09-21 ENCOUNTER — Telehealth: Payer: Self-pay

## 2013-09-21 ENCOUNTER — Telehealth: Payer: Self-pay | Admitting: *Deleted

## 2013-09-21 ENCOUNTER — Other Ambulatory Visit: Payer: Self-pay | Admitting: Internal Medicine

## 2013-09-21 DIAGNOSIS — E860 Dehydration: Secondary | ICD-10-CM

## 2013-09-21 DIAGNOSIS — C3490 Malignant neoplasm of unspecified part of unspecified bronchus or lung: Secondary | ICD-10-CM

## 2013-09-21 NOTE — Telephone Encounter (Signed)
Per [facility] staff-RN phone call scheduled appts. Advised schedulers

## 2013-09-21 NOTE — Telephone Encounter (Signed)
[name] called requesting appt for ivf for pt thurs or fri morning. Returned call with appt set up for 1030 Friday morning. She will inform the pt.

## 2013-09-22 ENCOUNTER — Telehealth: Payer: Self-pay | Admitting: Internal Medicine

## 2013-09-22 ENCOUNTER — Telehealth: Payer: Self-pay

## 2013-09-22 NOTE — Telephone Encounter (Signed)
S/w debbie and clarified upcoming appts.

## 2013-09-22 NOTE — Telephone Encounter (Signed)
, °

## 2013-09-23 ENCOUNTER — Other Ambulatory Visit: Payer: Self-pay

## 2013-09-23 ENCOUNTER — Ambulatory Visit (HOSPITAL_BASED_OUTPATIENT_CLINIC_OR_DEPARTMENT_OTHER): Payer: BC Managed Care – PPO

## 2013-09-23 VITALS — BP 122/78 | HR 72 | Temp 98.0°F | Resp 18

## 2013-09-23 DIAGNOSIS — E860 Dehydration: Secondary | ICD-10-CM

## 2013-09-23 DIAGNOSIS — C3490 Malignant neoplasm of unspecified part of unspecified bronchus or lung: Secondary | ICD-10-CM

## 2013-09-23 DIAGNOSIS — C7A1 Malignant poorly differentiated neuroendocrine tumors: Secondary | ICD-10-CM

## 2013-09-23 DIAGNOSIS — C7B8 Other secondary neuroendocrine tumors: Secondary | ICD-10-CM

## 2013-09-23 MED ORDER — LORAZEPAM 0.5 MG PO TABS: 0.5000 mg | ORAL_TABLET | Freq: Two times a day (BID) | ORAL | Status: DC | PRN

## 2013-09-23 MED ORDER — SODIUM CHLORIDE 0.9 % IV SOLN
Freq: Once | INTRAVENOUS | Status: AC
  Administered 2013-09-23: 11:00:00 via INTRAVENOUS

## 2013-09-23 NOTE — Patient Instructions (Signed)

## 2013-09-25 ENCOUNTER — Other Ambulatory Visit: Payer: Self-pay | Admitting: Radiology

## 2013-09-26 ENCOUNTER — Other Ambulatory Visit: Payer: Self-pay | Admitting: Radiology

## 2013-09-26 ENCOUNTER — Other Ambulatory Visit: Payer: Self-pay | Admitting: Internal Medicine

## 2013-09-27 ENCOUNTER — Telehealth: Payer: Self-pay

## 2013-09-27 ENCOUNTER — Other Ambulatory Visit: Payer: Self-pay

## 2013-09-27 ENCOUNTER — Telehealth: Payer: Self-pay | Admitting: *Deleted

## 2013-09-27 NOTE — Telephone Encounter (Signed)
Per [facility] staff message scheduled appts. Advised scheduler 

## 2013-09-27 NOTE — Telephone Encounter (Signed)
S/w bonnie that debbie called requesting fluids after MD appt on 8/6. Called bonnie to tell her to plan on it, we do not have the specific time yet.

## 2013-09-28 ENCOUNTER — Other Ambulatory Visit: Payer: BC Managed Care – PPO

## 2013-09-28 ENCOUNTER — Ambulatory Visit: Payer: BC Managed Care – PPO

## 2013-09-28 ENCOUNTER — Other Ambulatory Visit: Payer: Self-pay | Admitting: Internal Medicine

## 2013-09-28 ENCOUNTER — Ambulatory Visit (HOSPITAL_COMMUNITY)
Admission: RE | Admit: 2013-09-28 | Discharge: 2013-09-28 | Disposition: A | Discharge status: Home / Self Care | Payer: BC Managed Care – PPO | Source: Ambulatory Visit | Attending: Internal Medicine | Admitting: Internal Medicine

## 2013-09-28 ENCOUNTER — Encounter (HOSPITAL_COMMUNITY): Payer: Self-pay

## 2013-09-28 ENCOUNTER — Other Ambulatory Visit: Payer: Self-pay | Admitting: *Deleted

## 2013-09-28 DIAGNOSIS — I10 Essential (primary) hypertension: Secondary | ICD-10-CM | POA: Insufficient documentation

## 2013-09-28 DIAGNOSIS — J449 Chronic obstructive pulmonary disease, unspecified: Secondary | ICD-10-CM | POA: Insufficient documentation

## 2013-09-28 DIAGNOSIS — E785 Hyperlipidemia, unspecified: Secondary | ICD-10-CM | POA: Insufficient documentation

## 2013-09-28 DIAGNOSIS — K1230 Oral mucositis (ulcerative), unspecified: Secondary | ICD-10-CM

## 2013-09-28 DIAGNOSIS — J4489 Other specified chronic obstructive pulmonary disease: Secondary | ICD-10-CM | POA: Insufficient documentation

## 2013-09-28 DIAGNOSIS — Z86711 Personal history of pulmonary embolism: Secondary | ICD-10-CM | POA: Insufficient documentation

## 2013-09-28 DIAGNOSIS — Z79899 Other long term (current) drug therapy: Secondary | ICD-10-CM | POA: Insufficient documentation

## 2013-09-28 DIAGNOSIS — C3490 Malignant neoplasm of unspecified part of unspecified bronchus or lung: Secondary | ICD-10-CM

## 2013-09-28 DIAGNOSIS — I878 Other specified disorders of veins: Secondary | ICD-10-CM

## 2013-09-28 DIAGNOSIS — C3400 Malignant neoplasm of unspecified main bronchus: Secondary | ICD-10-CM | POA: Insufficient documentation

## 2013-09-28 DIAGNOSIS — Z87891 Personal history of nicotine dependence: Secondary | ICD-10-CM | POA: Insufficient documentation

## 2013-09-28 DIAGNOSIS — K219 Gastro-esophageal reflux disease without esophagitis: Secondary | ICD-10-CM | POA: Insufficient documentation

## 2013-09-28 LAB — CBC WITH DIFFERENTIAL/PLATELET
BASOS ABS: 0 10*3/uL (ref 0.0–0.1)
Basophils Relative: 0 % (ref 0–1)
Eosinophils Absolute: 0 10*3/uL (ref 0.0–0.7)
Eosinophils Relative: 1 % (ref 0–5)
HEMATOCRIT: 26.8 % — AB (ref 39.0–52.0)
Hemoglobin: 9.4 g/dL — ABNORMAL LOW (ref 13.0–17.0)
LYMPHS ABS: 0.6 10*3/uL — AB (ref 0.7–4.0)
Lymphocytes Relative: 12 % (ref 12–46)
MCH: 33.9 pg (ref 26.0–34.0)
MCHC: 35.1 g/dL (ref 30.0–36.0)
MCV: 96.8 fL (ref 78.0–100.0)
Monocytes Absolute: 0.7 10*3/uL (ref 0.1–1.0)
Monocytes Relative: 14 % — ABNORMAL HIGH (ref 3–12)
NEUTROS ABS: 3.4 10*3/uL (ref 1.7–7.7)
Neutrophils Relative %: 73 % (ref 43–77)
PLATELETS: 149 10*3/uL — AB (ref 150–400)
RBC: 2.77 MIL/uL — ABNORMAL LOW (ref 4.22–5.81)
RDW: 16.8 % — AB (ref 11.5–15.5)
WBC: 4.6 10*3/uL (ref 4.0–10.5)

## 2013-09-28 LAB — PROTIME-INR
INR: 1.11 (ref 0.00–1.49)
Prothrombin Time: 14.3 seconds (ref 11.6–15.2)

## 2013-09-28 MED ORDER — FENTANYL CITRATE 0.05 MG/ML IJ SOLN
INTRAMUSCULAR | Status: AC
  Filled 2013-09-28: qty 6

## 2013-09-28 MED ORDER — VANCOMYCIN HCL IN DEXTROSE 1-5 GM/200ML-% IV SOLN
1000.0000 mg | Freq: Once | INTRAVENOUS | Status: AC
  Administered 2013-09-28: 1000 mg via INTRAVENOUS
  Filled 2013-09-28: qty 200

## 2013-09-28 MED ORDER — HEPARIN SOD (PORK) LOCK FLUSH 100 UNIT/ML IV SOLN
INTRAVENOUS | Status: AC | PRN
  Administered 2013-09-28: 500 [IU]

## 2013-09-28 MED ORDER — MIDAZOLAM HCL 2 MG/2ML IJ SOLN
INTRAMUSCULAR | Status: AC | PRN
  Administered 2013-09-28 (×3): 1 mg via INTRAVENOUS

## 2013-09-28 MED ORDER — FENTANYL CITRATE 0.05 MG/ML IJ SOLN
INTRAMUSCULAR | Status: AC | PRN
  Administered 2013-09-28 (×3): 25 ug via INTRAVENOUS

## 2013-09-28 MED ORDER — MIDAZOLAM HCL 2 MG/2ML IJ SOLN
INTRAMUSCULAR | Status: AC
  Filled 2013-09-28: qty 6

## 2013-09-28 MED ORDER — SODIUM CHLORIDE 0.9 % IV SOLN
INTRAVENOUS | Status: DC
  Administered 2013-09-28: 08:00:00 via INTRAVENOUS

## 2013-09-28 MED ORDER — HEPARIN SOD (PORK) LOCK FLUSH 100 UNIT/ML IV SOLN
INTRAVENOUS | Status: AC
  Filled 2013-09-28: qty 5

## 2013-09-28 MED ORDER — MORPHINE SULFATE ER 30 MG PO TBCR: 30.0000 mg | EXTENDED_RELEASE_TABLET | Freq: Two times a day (BID) | ORAL | Status: DC

## 2013-09-28 MED ORDER — LIDOCAINE-EPINEPHRINE (PF) 2 %-1:200000 IJ SOLN
INTRAMUSCULAR | Status: AC
  Filled 2013-09-28: qty 20

## 2013-09-28 NOTE — Telephone Encounter (Signed)
Daughter, Clover Mealy called requesting to pick up script for his MS Contin. He only has one pill left. Currently in radiology getting PAC. Will follow up with on call MD and notify daughter when script is ready.

## 2013-09-28 NOTE — Procedures (Signed)
Successful placement of right IJ approach port-a-cath with tip at the superior caval atrial junction. The catheter is ready for immediate use. No immediate post procedural complications.

## 2013-09-28 NOTE — H&P (Signed)
Ricky Villanueva is an 63 y.o. male.   Chief Complaint: "I'm getting a port a cath" HPI: Patient with history of stage IV small cell lung carcinoma presents today for port a cath placement for chemotherapy and hydration.  Past Medical History  Diagnosis Date  . Hyperlipidemia   . Hypertension   . GERD (gastroesophageal reflux disease)   . Respiratory failure with hypoxia 04/21/2013    secondary to pneumonia/notes 04/21/2013  . Pulmonary embolism     "got one in there now" (04/21/2013)  . COPD (chronic obstructive pulmonary disease)   . Pneumonia 2/?01/2014    "wouldn't get better; hospitalized 04/21/2013)  . Arthritis     "minor in my right hand" (04/21/2013)  . Cancer     lung ca dx'd 03/2013  . History of radiation therapy 05/03/13-05/18/13    chest & whole brain    Past Surgical History  Procedure Laterality Date  . Inguinal hernia repair Right ~ 2005  . Finger surgery Left ~ 1989    "crushed end of my middle finger off"   . Video bronchoscopy Bilateral 04/26/2013    Procedure: VIDEO BRONCHOSCOPY WITH FLUORO;  Surgeon: Wilhelmina Mcardle, MD;  Location: [facility] ENDOSCOPY;  Service: Cardiopulmonary;  Laterality: Bilateral;    Family History  Problem Relation Age of Onset  . Breast cancer Sister   . Emphysema Mother   . Diabetes Father    Social History:  reports that he quit smoking about 21 months ago. His smoking use included Cigarettes. He has a 94 pack-year smoking history. He has never used smokeless tobacco. He reports that he does not drink alcohol or use illicit drugs.  Allergies:  Allergies  Allergen Reactions  . Decadron [Dexamethasone]     Slurred speech  . Augmentin [Amoxicillin-Pot Clavulanate] Itching and Rash    rash    Current outpatient prescriptions:docusate sodium (COLACE) 100 MG capsule, Take 100 mg by mouth 2 (two) times daily., Disp: , Rfl: ;  LORazepam (ATIVAN) 0.5 MG tablet, Take 1 tablet (0.5 mg total) by mouth 2 (two) times daily as needed for anxiety., Disp:  60 tablet, Rfl: 0;  morphine (MS CONTIN) 30 MG 12 hr tablet, Take 1 tablet (30 mg total) by mouth every 12 (twelve) hours., Disp: 60 tablet, Rfl: 0 omeprazole-sodium bicarbonate (ZEGERID) 40-1100 MG per capsule, Take 1 capsule by mouth daily before breakfast., Disp: 30 capsule, Rfl: 1;  ondansetron (ZOFRAN) 8 MG tablet, Take 8 mg by mouth every 8 (eight) hours as needed for nausea or vomiting. , Disp: , Rfl: ;  polyethylene glycol powder (MIRALAX) powder, Take 17 g by mouth daily., Disp: 255 g, Rfl: 0 tiotropium (SPIRIVA HANDIHALER) 18 MCG inhalation capsule, Place 1 capsule (18 mcg total) into inhaler and inhale daily., Disp: 90 capsule, Rfl: 1;  albuterol (PROVENTIL HFA;VENTOLIN HFA) 108 (90 BASE) MCG/ACT inhaler, Inhale 2 puffs into the lungs every 6 (six) hours as needed for wheezing or shortness of breath., Disp: 1 Inhaler, Rfl: 2 lidocaine-prilocaine (EMLA) cream, Apply to port site one hour before treatment and cover with plastic wrap., Disp: 1 each, Rfl: 2;  mirtazapine (REMERON) 30 MG tablet, Take 1 tablet (30 mg total) by mouth at bedtime., Disp: 90 tablet, Rfl: 1;  nystatin (MYCOSTATIN) 100000 UNIT/ML suspension, Use as directed 5 mLs in the mouth or throat 4 (four) times daily as needed (after chemo). , Disp: , Rfl:  oxyCODONE (OXY IR/ROXICODONE) 5 MG immediate release tablet, Take 1 tablet (5 mg total) by mouth every 4 (  four) hours as needed for severe pain., Disp: 60 tablet, Rfl: 0;  prochlorperazine (COMPAZINE) 10 MG tablet, Take 10 mg by mouth every 6 (six) hours as needed for nausea or vomiting., Disp: , Rfl: ;  rivaroxaban (XARELTO) 20 MG TABS tablet, Take 20 mg by mouth daily with supper., Disp: , Rfl:  Current facility-administered medications:0.9 %  sodium chloride infusion, , Intravenous, Continuous, Hedy Jacob, PA-C, Last Rate: 50 mL/hr at 09/28/13 6213;  heparin lock flush 100 UNIT/ML injection, , , , ;  lidocaine-EPINEPHrine (XYLOCAINE W/EPI) 2 %-1:200000 (PF) injection, , , , ;   vancomycin (VANCOCIN) IVPB 1000 mg/200 mL premix, 1,000 mg, Intravenous, Once, Koreen D Morgan, PA-C Facility-Administered Medications Ordered in Other Encounters: 0.9 %  sodium chloride infusion, 1,000 mL, Intravenous, Once, Concha Norway, MD;  0.9 %  sodium chloride infusion, , Intravenous, Once, Concha Norway, MD   Results for orders placed during the hospital encounter of 09/28/13 (from the past 48 hour(s))  CBC WITH DIFFERENTIAL     Status: Abnormal   Collection Time    09/28/13  7:54 AM      Result Value Ref Range   WBC 4.6  4.0 - 10.5 K/uL   RBC 2.77 (*) 4.22 - 5.81 MIL/uL   Hemoglobin 9.4 (*) 13.0 - 17.0 g/dL   HCT 26.8 (*) 39.0 - 52.0 %   MCV 96.8  78.0 - 100.0 fL   MCH 33.9  26.0 - 34.0 pg   MCHC 35.1  30.0 - 36.0 g/dL   RDW 16.8 (*) 11.5 - 15.5 %   Platelets 149 (*) 150 - 400 K/uL   Neutrophils Relative % 73  43 - 77 %   Neutro Abs 3.4  1.7 - 7.7 K/uL   Lymphocytes Relative 12  12 - 46 %   Lymphs Abs 0.6 (*) 0.7 - 4.0 K/uL   Monocytes Relative 14 (*) 3 - 12 %   Monocytes Absolute 0.7  0.1 - 1.0 K/uL   Eosinophils Relative 1  0 - 5 %   Eosinophils Absolute 0.0  0.0 - 0.7 K/uL   Basophils Relative 0  0 - 1 %   Basophils Absolute 0.0  0.0 - 0.1 K/uL  PROTIME-INR     Status: None   Collection Time    09/28/13  7:54 AM      Result Value Ref Range   Prothrombin Time 14.3  11.6 - 15.2 seconds   INR 1.11  0.00 - 1.49   No results found.  Review of Systems  Constitutional: Positive for weight loss. Negative for fever and chills.  Respiratory: Negative for hemoptysis.        Occ cough and dyspnea with exertion  Cardiovascular: Negative for chest pain.  Gastrointestinal: Negative for nausea, vomiting and abdominal pain.  Genitourinary: Negative for dysuria and hematuria.  Musculoskeletal: Negative for back pain.  Neurological: Negative for headaches.  Endo/Heme/Allergies: Does not bruise/bleed easily.    Blood pressure 108/73, pulse 82, temperature 97.2 F (36.2 C),  temperature source Oral, resp. rate 20, height 5\' 7"  (1.702 m), weight 95 lb 6 oz (43.262 kg), SpO2 100.00%. Physical Exam  Constitutional: He is oriented to person, place, and time.  Thin WM in NAD  Cardiovascular: Normal rate and regular rhythm.   Respiratory: Effort normal.  Distant BS bilat  GI: Soft. Bowel sounds are normal. There is no tenderness.  Musculoskeletal: Normal range of motion. He exhibits no edema.  Neurological: He is alert and oriented to person, place,  and time.     Assessment/Plan Patient with history of stage IV small cell lung carcinoma presents today for port a cath placement for chemotherapy and hydration. Details/risks of procedure d/w pt/family with their understanding and consent.  Samarie Pinder,D KEVIN 09/28/2013, 9:28 AM

## 2013-09-28 NOTE — Discharge Instructions (Signed)
Implanted Port Home Guide °An implanted port is a type of central line that is placed under the skin. Central lines are used to provide IV access when treatment or nutrition needs to be given through a person's veins. Implanted ports are used for long-term IV access. An implanted port may be placed because:  °· You need IV medicine that would be irritating to the small veins in your hands or arms.   °· You need long-term IV medicines, such as antibiotics.   °· You need IV nutrition for a long period.   °· You need frequent blood draws for lab tests.   °· You need dialysis.   °Implanted ports are usually placed in the chest area, but they can also be placed in the upper arm, the abdomen, or the leg. An implanted port has two main parts:  °· Reservoir. The reservoir is round and will appear as a small, raised area under your skin. The reservoir is the part where a needle is inserted to give medicines or draw blood.   °· Catheter. The catheter is a thin, flexible tube that extends from the reservoir. The catheter is placed into a large vein. Medicine that is inserted into the reservoir goes into the catheter and then into the vein.   °HOW WILL I CARE FOR MY INCISION SITE? °Do not get the incision site wet. Bathe or shower as directed by your health care provider.  °HOW IS MY PORT ACCESSED? °Special steps must be taken to access the port:  °· Before the port is accessed, a numbing cream can be placed on the skin. This helps numb the skin over the port site.   °· Your health care provider uses a sterile technique to access the port. °¨ Your health care provider must put on a mask and sterile gloves. °¨ The skin over your port is cleaned carefully with an antiseptic and allowed to dry. °¨ The port is gently pinched between sterile gloves, and a needle is inserted into the port. °· Only "non-coring" port needles should be used to access the port. Once the port is accessed, a blood return should be checked. This helps  ensure that the port is in the vein and is not clogged.   °· If your port needs to remain accessed for a constant infusion, a clear (transparent) bandage will be placed over the needle site. The bandage and needle will need to be changed every week, or as directed by your health care provider.   °· Keep the bandage covering the needle clean and dry. Do not get it wet. Follow your health care provider's instructions on how to take a shower or bath while the port is accessed.   °· If your port does not need to stay accessed, no bandage is needed over the port.   °WHAT IS FLUSHING? °Flushing helps keep the port from getting clogged. Follow your health care provider's instructions on how and when to flush the port. Ports are usually flushed with saline solution or a medicine called heparin. The need for flushing will depend on how the port is used.  °· If the port is used for intermittent medicines or blood draws, the port will need to be flushed:   °¨ After medicines have been given.   °¨ After blood has been drawn.   °¨ As part of routine maintenance.   °· If a constant infusion is running, the port may not need to be flushed.   °HOW LONG WILL MY PORT STAY IMPLANTED? °The port can stay in for as long as your health care   provider thinks it is needed. When it is time for the port to come out, surgery will be done to remove it. The procedure is similar to the one performed when the port was put in.  WHEN SHOULD I SEEK IMMEDIATE MEDICAL CARE? When you have an implanted port, you should seek immediate medical care if:   You notice a bad smell coming from the incision site.   You have swelling, redness, or drainage at the incision site.   You have more swelling or pain at the port site or the surrounding area.   You have a fever that is not controlled with medicine. Document Released: 02/10/2005 Document Revised: 12/01/2012 Document Reviewed: 10/18/2012 [facility] Patient Information 2015 [place], [place]. This  information is not intended to replace advice given to you by your health care provider. Make sure you discuss any questions you have with your health care provider. Conscious Sedation Sedation is the use of medicines to promote relaxation and relieve discomfort and anxiety. Conscious sedation is a type of sedation. Under conscious sedation you are less alert than normal but are still able to respond to instructions or stimulation. Conscious sedation is used during short medical and dental procedures. It is milder than deep sedation or general anesthesia and allows you to return to your regular activities sooner.  LET [facility] CARE PROVIDER KNOW ABOUT:   Any allergies you have.  All medicines you are taking, including vitamins, herbs, eye drops, creams, and over-the-counter medicines.  Use of steroids (by mouth or creams).  Previous problems you or members of your family have had with the use of anesthetics.  Any blood disorders you have.  Previous surgeries you have had.  Medical conditions you have.  Possibility of pregnancy, if this applies.  Use of cigarettes, alcohol, or illegal drugs. RISKS AND COMPLICATIONS Generally, this is a safe procedure. However, as with any procedure, problems can occur. Possible problems include:  Oversedation.  Trouble breathing on your own. You may need to have a breathing tube until you are awake and breathing on your own.  Allergic reaction to any of the medicines used for the procedure. BEFORE THE PROCEDURE  You may have blood tests done. These tests can help show how well your kidneys and liver are working. They can also show how well your blood clots.  A physical exam will be done.  Only take medicines as directed by your health care provider. You may need to stop taking medicines (such as blood thinners, aspirin, or nonsteroidal anti-inflammatory drugs) before the procedure.   Do not eat or drink at least 6 hours before the procedure  or as directed by your health care provider.  Arrange for a responsible adult, family member, or friend to take you home after the procedure. He or she should stay with you for at least 24 hours after the procedure, until the medicine has worn off. PROCEDURE   An intravenous (IV) catheter will be inserted into one of your veins. Medicine will be able to flow directly into your body through this catheter. You may be given medicine through this tube to help prevent pain and help you relax.  The medical or dental procedure will be done. AFTER THE PROCEDURE  You will stay in a recovery area until the medicine has worn off. Your blood pressure and pulse will be checked.   Depending on the procedure you had, you may be allowed to go home when you can tolerate liquids and your pain is under  control. Document Released: 11/05/2000 Document Revised: 02/15/2013 Document Reviewed: 10/18/2012 [facility] Patient Information 2015 [place], [place]. This information is not intended to replace advice given to you by your health care provider. Make sure you discuss any questions you have with your health care provider.

## 2013-09-28 NOTE — Telephone Encounter (Signed)
Notified daughter of script ready for pick up. Bring photo ID.

## 2013-09-29 ENCOUNTER — Other Ambulatory Visit: Payer: Self-pay | Admitting: *Deleted

## 2013-09-29 ENCOUNTER — Encounter: Payer: Self-pay | Admitting: *Deleted

## 2013-09-29 ENCOUNTER — Encounter: Payer: Self-pay | Admitting: Hematology

## 2013-09-29 ENCOUNTER — Ambulatory Visit (HOSPITAL_BASED_OUTPATIENT_CLINIC_OR_DEPARTMENT_OTHER): Payer: BC Managed Care – PPO | Admitting: Hematology

## 2013-09-29 ENCOUNTER — Ambulatory Visit (HOSPITAL_BASED_OUTPATIENT_CLINIC_OR_DEPARTMENT_OTHER): Payer: BC Managed Care – PPO

## 2013-09-29 ENCOUNTER — Other Ambulatory Visit (HOSPITAL_BASED_OUTPATIENT_CLINIC_OR_DEPARTMENT_OTHER): Payer: BC Managed Care – PPO

## 2013-09-29 VITALS — BP 105/70 | HR 81 | Temp 99.6°F | Resp 18 | Ht 67.0 in | Wt 99.6 lb

## 2013-09-29 VITALS — BP 110/62 | HR 66 | Temp 97.7°F | Resp 16

## 2013-09-29 DIAGNOSIS — C7A1 Malignant poorly differentiated neuroendocrine tumors: Secondary | ICD-10-CM

## 2013-09-29 DIAGNOSIS — E860 Dehydration: Secondary | ICD-10-CM

## 2013-09-29 DIAGNOSIS — K209 Esophagitis, unspecified without bleeding: Secondary | ICD-10-CM

## 2013-09-29 DIAGNOSIS — E43 Unspecified severe protein-calorie malnutrition: Secondary | ICD-10-CM

## 2013-09-29 DIAGNOSIS — C3490 Malignant neoplasm of unspecified part of unspecified bronchus or lung: Secondary | ICD-10-CM

## 2013-09-29 DIAGNOSIS — I959 Hypotension, unspecified: Secondary | ICD-10-CM

## 2013-09-29 DIAGNOSIS — R630 Anorexia: Secondary | ICD-10-CM

## 2013-09-29 DIAGNOSIS — R918 Other nonspecific abnormal finding of lung field: Secondary | ICD-10-CM

## 2013-09-29 DIAGNOSIS — I2699 Other pulmonary embolism without acute cor pulmonale: Secondary | ICD-10-CM

## 2013-09-29 DIAGNOSIS — C7B8 Other secondary neuroendocrine tumors: Secondary | ICD-10-CM

## 2013-09-29 DIAGNOSIS — J438 Other emphysema: Secondary | ICD-10-CM

## 2013-09-29 DIAGNOSIS — D630 Anemia in neoplastic disease: Secondary | ICD-10-CM

## 2013-09-29 DIAGNOSIS — K2990 Gastroduodenitis, unspecified, without bleeding: Secondary | ICD-10-CM

## 2013-09-29 DIAGNOSIS — K2970 Gastritis, unspecified, without bleeding: Secondary | ICD-10-CM

## 2013-09-29 LAB — COMPREHENSIVE METABOLIC PANEL (CC13)
ALK PHOS: 78 U/L (ref 40–150)
ALT: 41 U/L (ref 0–55)
AST: 18 U/L (ref 5–34)
Albumin: 3.3 g/dL — ABNORMAL LOW (ref 3.5–5.0)
Anion Gap: 8 mEq/L (ref 3–11)
BUN: 8.4 mg/dL (ref 7.0–26.0)
CALCIUM: 9.2 mg/dL (ref 8.4–10.4)
CHLORIDE: 107 meq/L (ref 98–109)
CO2: 26 meq/L (ref 22–29)
Creatinine: 0.7 mg/dL (ref 0.7–1.3)
GLUCOSE: 102 mg/dL (ref 70–140)
POTASSIUM: 3.5 meq/L (ref 3.5–5.1)
Sodium: 141 mEq/L (ref 136–145)
TOTAL PROTEIN: 6.1 g/dL — AB (ref 6.4–8.3)
Total Bilirubin: 0.28 mg/dL (ref 0.20–1.20)

## 2013-09-29 LAB — CBC WITH DIFFERENTIAL/PLATELET
BASO%: 0.2 % (ref 0.0–2.0)
Basophils Absolute: 0 10*3/uL (ref 0.0–0.1)
EOS ABS: 0 10*3/uL (ref 0.0–0.5)
EOS%: 0.6 % (ref 0.0–7.0)
HCT: 27.9 % — ABNORMAL LOW (ref 38.4–49.9)
HGB: 9.3 g/dL — ABNORMAL LOW (ref 13.0–17.1)
LYMPH%: 13.1 % — ABNORMAL LOW (ref 14.0–49.0)
MCH: 33.3 pg (ref 27.2–33.4)
MCHC: 33.3 g/dL (ref 32.0–36.0)
MCV: 100 fL — ABNORMAL HIGH (ref 79.3–98.0)
MONO#: 0.9 10*3/uL (ref 0.1–0.9)
MONO%: 16.5 % — AB (ref 0.0–14.0)
NEUT#: 3.6 10*3/uL (ref 1.5–6.5)
NEUT%: 69.6 % (ref 39.0–75.0)
Platelets: 197 10*3/uL (ref 140–400)
RBC: 2.79 10*6/uL — ABNORMAL LOW (ref 4.20–5.82)
RDW: 17.4 % — AB (ref 11.0–14.6)
WBC: 5.2 10*3/uL (ref 4.0–10.3)
lymph#: 0.7 10*3/uL — ABNORMAL LOW (ref 0.9–3.3)

## 2013-09-29 LAB — LACTATE DEHYDROGENASE (CC13): LDH: 132 U/L (ref 125–245)

## 2013-09-29 MED ORDER — SODIUM CHLORIDE 0.9 % IJ SOLN
10.0000 mL | INTRAMUSCULAR | Status: DC | PRN
  Administered 2013-09-29: 10 mL via INTRAVENOUS
  Filled 2013-09-29: qty 10

## 2013-09-29 MED ORDER — SODIUM CHLORIDE 0.9 % IV SOLN
Freq: Once | INTRAVENOUS | Status: AC
  Administered 2013-09-29: 12:00:00 via INTRAVENOUS

## 2013-09-29 MED ORDER — HEPARIN SOD (PORK) LOCK FLUSH 100 UNIT/ML IV SOLN
500.0000 [IU] | Freq: Once | INTRAVENOUS | Status: AC
  Administered 2013-09-29: 500 [IU] via INTRAVENOUS
  Filled 2013-09-29: qty 5

## 2013-09-29 NOTE — Patient Instructions (Signed)
Dehydration, Adult Dehydration is when you lose more fluids from the body than you take in. Vital organs like the kidneys, brain, and heart cannot function without a proper amount of fluids and salt. Any loss of fluids from the body can cause dehydration.  CAUSES   Vomiting.  Diarrhea.  Excessive sweating.  Excessive urine output.  Fever. SYMPTOMS  Mild dehydration  Thirst.  Dry lips.  Slightly dry mouth. Moderate dehydration  Very dry mouth.  Sunken eyes.  Skin does not bounce back quickly when lightly pinched and released.  Dark urine and decreased urine production.  Decreased tear production.  Headache. Severe dehydration  Very dry mouth.  Extreme thirst.  Rapid, weak pulse (more than 100 beats per minute at rest).  Cold hands and feet.  Not able to sweat in spite of heat and temperature.  Rapid breathing.  Blue lips.  Confusion and lethargy.  Difficulty being awakened.  Minimal urine production.  No tears. DIAGNOSIS  Your caregiver will diagnose dehydration based on your symptoms and your exam. Blood and urine tests will help confirm the diagnosis. The diagnostic evaluation should also identify the cause of dehydration. TREATMENT  Treatment of mild or moderate dehydration can often be done at home by increasing the amount of fluids that you drink. It is best to drink small amounts of fluid more often. Drinking too much at one time can make vomiting worse. Refer to the home care instructions below. Severe dehydration needs to be treated at the hospital where you will probably be given intravenous (IV) fluids that contain water and electrolytes. HOME CARE INSTRUCTIONS   Ask your caregiver about specific rehydration instructions.  Drink enough fluids to keep your urine clear or pale yellow.  Drink small amounts frequently if you have nausea and vomiting.  Eat as you normally do.  Avoid:  Foods or drinks high in sugar.  Carbonated  drinks.  Juice.  Extremely hot or cold fluids.  Drinks with caffeine.  Fatty, greasy foods.  Alcohol.  Tobacco.  Overeating.  Gelatin desserts.  Wash your hands well to avoid spreading bacteria and viruses.  Only take over-the-counter or prescription medicines for pain, discomfort, or fever as directed by your caregiver.  Ask your caregiver if you should continue all prescribed and over-the-counter medicines.  Keep all follow-up appointments with your caregiver. SEEK MEDICAL CARE IF:  You have abdominal pain and it increases or stays in one area (localizes).  You have a rash, stiff neck, or severe headache.  You are irritable, sleepy, or difficult to awaken.  You are weak, dizzy, or extremely thirsty. SEEK IMMEDIATE MEDICAL CARE IF:   You are unable to keep fluids down or you get worse despite treatment.  You have frequent episodes of vomiting or diarrhea.  You have blood or green matter (bile) in your vomit.  You have blood in your stool or your stool looks black and tarry.  You have not urinated in 6 to 8 hours, or you have only urinated a small amount of very dark urine.  You have a fever.  You faint. MAKE SURE YOU:   Understand these instructions.  Will watch your condition.  Will get help right away if you are not doing well or get worse. Document Released: 02/10/2005 Document Revised: 05/05/2011 Document Reviewed: 09/30/2010 Exit[employer] Patient Information 2015 [employer]. This information is not intended to replace advice given to you by your health care provider. Make sure you discuss any questions you have with your health care   provider.  

## 2013-09-29 NOTE — Progress Notes (Signed)
[facility] OFFICE PROGRESS NOTE  [name], [place] [address]  DIAGNOSIS: Small cell lung cancer s/p 5 cycles of chemotherapy.  Chief Complaint  Patient presents with  . Follow-up    CURRENT CHEMOTHERAPY TREATMENT:  He completed First cycle of cisplatin (79 mg/m2 on day #1) plus etoposide (100 mg/m2 on 3/25, 3/26 and 05/20/2013.  The change in drugs from Cisplatin to Carboplatin. Cycle # 1 of Carboplatin (on day#1 of 260 mg)  plus etoposide (75 mg/m2 daily on 04/29, 04/30 and 5/1) started on 06/22/2013.  He completed neulasta on 06/25/2013.  Cycle #2 of Carboplatin (on day#1 of 260 mg)  plus etoposide (75 mg/m2 daily on 05/20, 05/21 and 5/22).  He completed neulasta on 07/16/2013.  He completed cycle 3 of carboplatin (on day 1 of 260 mg) plus etoposide (75 mg/m2 daily on 6/17, 6/18, and 6/19) and receipt of neulasta on 08/13/2013. Cycle 4 Carboplatin and Etoposide given on 7/15, 7/16, and 09/09/2013 and Neulasta given on 09/10/2013.We will plan 5 cycle of Carboplatin plus etoposide for a total therapy of 6 cycles per NCCN guidelines. Next cycle that is last cycle will be started on 8/12,8/13 and 8/14. Neulasta on 10/12/2013.  PRIOR RADIATION TREATMENT:  WB XRT given on 03/10 - 03/25 per Dr. Pablo Ledger.   INTERVAL HISTORY: Ricky Villanueva 63 y.o. male with a history of small cell lung cancer is here for follow up. He was last seen by Dr Juliann Mule on 09/01/2013.  His past medical history also includes HTN/Hyperlipidemia and longstanding smoking history but quit 12/25/2011 presented to ER on 04/21/13 with worsening dyspnea, cough with streaks of blood over the past one week. In the ED, his oxygen saturation was 91% on 2 L nasal canula. CXR revealed a progressive increased density in the right lower lobe consistent with worsening atelectasis or pneumonia and a soft tissue fullness in the hilar region suspicious for lymphadenopathy or mass.and follow-up CT of chest was  recommended. It revealed a nonocclusive left upper lobe, right lower lobe pulmonary emboli with right heart strain. Bulky mediastinal and right hilar lymphadenopathy, encasing the of pulmonary arteries, pulmonary veins and bronchi, completely effacing right lower lobe segmental bronchi. Extensive consolidation in the right lung suggested postobstructive pneumonia, with interstitial prominence which may reflect superimposed lymphangitic spread of infection or neoplasm. He was admitted and started on antibiotics and heparin. Pulmonary was consulted and he had a video bronchoscopy on 03/03 with consistent with small cell ca of lung. 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 He had WBXRT as noted above. He then received his first cycle of chemotherapy on 03/25 thru 03/27 and was admitted 04/01 and discharged on 05/31/13 due to dehydration, acute renal failure, mucositis and subsequently had febrile neutropenia. We restarted the Xarelto which held due to profound thrombocytopenia.  Today, he is accompanied by his wife, daughter and his son. He reports feeling fatigued and lightheaded when standing. He reports improved appetite on remeron.  He reported to the emergency room on 6/27 with constipation and dehydration.    Overall doing well. No headaches or vison changes. No SOB or hemoptysis. Uses oxygen on prn basis. He is on MS Contin for pain bid and oxycodone for breakthrough pain. Pain control is adequate. He uses Boost for appetite. Have some gum sensitivity and I told him to use  Vit C 500 mg once a day. Also will start taking Iron once a day for anemia. I reviewed his last scan and showed imaging studies to family members. Because of his poor IV access, a port was placed yesterday in right internal jugular vein by IR.   Last CT scan Chest 08/03/2013 showed a positive response to therapy  with REGRESSION of the previously noted right hilar mass,  and decreased size of mediastinal lymphadenopathy.      MRI BRAIN 08/30/2013 Complete resolution of the tiny areas of enhancement in the right  parietal lobe and right paramedian brainstem. No mass effect or  significant edema. Mild increase in small vessel disease versus post  treatment effect.       MEDICAL HISTORY: Past Medical History  Diagnosis Date  . Hyperlipidemia   . Hypertension   . GERD (gastroesophageal reflux disease)   . Respiratory failure with hypoxia 04/21/2013    secondary to pneumonia/notes 04/21/2013  . Pulmonary embolism     "got one in there now" (04/21/2013)  . COPD (chronic obstructive pulmonary disease)   . Pneumonia 2/?01/2014    "wouldn't get better; hospitalized 04/21/2013)  . Arthritis     "minor in my right hand" (04/21/2013)  . Cancer     lung ca dx'd 03/2013  . History of radiation therapy 05/03/13-05/18/13    chest & whole brain    INTERIM HISTORY: has GERD (gastroesophageal reflux disease); HCAP (healthcare-associated pneumonia); Respiratory failure with hypoxia; HTN (hypertension); Acute pulmonary embolism; Lung mass; Emphysema lung; Small cell carcinoma of lung; Acute renal failure; Protein-calorie malnutrition, severe; and Anorexia on his problem list.    ALLERGIES:  is allergic to decadron and augmentin.  MEDICATIONS: has a current medication list which includes the following prescription(s): albuterol, docusate sodium, lidocaine-prilocaine, lorazepam, mirtazapine, morphine, nystatin, omeprazole-sodium bicarbonate, ondansetron, oxycodone, polyethylene glycol powder, prochlorperazine, rivaroxaban, and tiotropium, and the following Facility-Administered Medications: sodium chloride and sodium chloride.  SURGICAL HISTORY:  Past Surgical History  Procedure Laterality Date  . Inguinal hernia repair Right ~ 2005  . Finger surgery Left ~ 1989    "crushed end of my middle finger off"   .  Video bronchoscopy Bilateral 04/26/2013    Procedure: VIDEO BRONCHOSCOPY WITH FLUORO;  Surgeon: Wilhelmina Mcardle, MD;  [facility] ENDOSCOPY;  Service: Cardiopulmonary;  Laterality: Bilateral;    REVIEW OF SYSTEMS:   Constitutional: Denies fevers, chills or abnormal weight loss Eyes: Denies blurriness of vision Ears, nose, mouth, throat, and face: Denies mucositis or sore throat Respiratory: Denies cough, dyspnea or wheezes Cardiovascular: Denies palpitation, chest discomfort or lower extremity swelling Gastrointestinal:  Denies nausea, heartburn or change in bowel habits Skin: Denies abnormal skin rashes Lymphatics: Denies new lymphadenopathy or easy bruising Neurological:Denies numbness, tingling or new weaknesses Behavioral/Psych: Mood is stable, no new changes  All other systems were reviewed with the patient and are negative.  PHYSICAL EXAMINATION: ECOG PERFORMANCE STATUS: 1 - Symptomatic but completely ambulatory  Blood pressure 105/70, pulse 81, temperature 99.6 F (37.6 C), temperature source Oral, resp. rate 18, height 5\' 7"  (1.702 m), weight 99 lb 9.6 oz (45.178 kg).  GENERAL:alert, no distress and comfortable; HOH; chronically ill appearing, alopecia.  SKIN: skin color, texture, turgor are normal, no rashes or significant lesions EYES: normal, Conjunctiva are pink and non-injected, sclera clear OROPHARYNX:no exudate, no erythema and lips, buccal mucosa, and tongue normal  NECK: supple, thyroid normal size, non-tender, without nodularity LYMPH:  no palpable lymphadenopathy in the cervical, axillary or supraclavicular LUNGS: clear  to auscultation with normal breathing effort, no wheezes or rhonchi HEART: regular rate & rhythm and no murmurs and no lower extremity edema ABDOMEN:abdomen soft, non-tender and normal bowel sounds Musculoskeletal:no cyanosis of digits and no clubbing  NEURO: alert & oriented x 3 with fluent speech, no focal motor/sensory deficits; Cranial nerves II  - XII intact.  Negative rhomberge.  Strength 5/5 in upper and lower extremities bilaterally.  Gait, normal.          ASSESSMENT: ALTUS ZAINO 63 y.o. male with a history of SCLC right lung with evidence of brain metastasis making it an Extensive stage which is not curable and treatment goal is "Palliative".   PLAN:  1. 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 Pablo Ledger completed whole brain XRT on 03/25. We started chemotherapy on 03/25 and he completed Cycle #1 On cisplatin 80 mg/m2 on day 1, etoposide 100 mg/m2 days 1,2,3. He had toxicity related to diminished hearing and hospitalization.  He has been evaluated by Dr. Constance Holster who also recommends hearing aides.    --Given his complains of hearing fullness and intermittent tinnitus, we discontinued cisplatin and substituted it with carboplatin AUC 3. In addition, we will decrease etoposide dose by 25% due to profound neutropenia. We will also continue neulasta of Day#4 of his chemotherapy each cycle due to febrile neutropenia. He received chemotherapy on 7/15 for his Cycle #4, day #1 of Carboplatin plus etoposide.  He still reports gastritis/esophagitis and anorexia. He will complete a total of 5 cycles of carboplatin plus etoposide (dose reduced).  He has already had one cycle of cisplatin plus etoposide as noted above.  NCCN guidelines support 4-6 cycles of induction therapy followed by observation.   --We obtained a CT of abdomen and pelvis and chest (06/09) given above symptoms to assess response to therapy following 2 cycles of therapy which demonstrated an positive interval response to therapy. In addition, Dr. Pablo Ledger ordered an MRI of brain which showed resolution of his brain metastases.   2. Gastritis/esophagitis.  --Continue zegerid OTC.    Seen by GI with a normal barium swallow.   3. Anorexia.  --Likely secondary to #1. He reports not tolerating marinol so we will prescribe Remeron 30  mg qhs instead. His appetite is improving on remeron.   4. PE (04/21/2013).  --CTA of chest noted above consistent with Nonocclusive left upper lobe, right lower lobe pulmonary emboli. Right heart strain. Continue xarelto 20 mg daily.   5. Anemia secondary to #1 plus/minus chemotherapy, symptomatic with fatigue and lightheadedness when standing.  --He is asymptomatic.  His hemoglobin is 11.4. Start Iron and Vitamin C.  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 --We DISCONTINUED cisplatin and now patient is on carboplatin.  He has been fitted and received his hearing aids.   7. Dehydration and hypotension.  --We will continue Normal saline prn  8. Emphysema  --Continue spiriva hanihaler 18 mcg inhalation daily. Oxygen 2 liters by [place] prn for moderate exertion.   9. Follow-up.  --Patient will follow up chemotherapy with carbo plus etoposide cycle #5, day #1 on 10/05/13 through 10/07/13 and neulasta on 10/08/13.  We will plan a restaging scan Ct scan CAP in mid-September.    All questions were answered. The patient knows to call the clinic with any problems, questions or concerns. We can certainly see the patient much sooner if necessary.  I spent 25 minutes counseling the patient face to face. The total time spent in  the appointment was 40 minutes.    Bernadene Bell, MD Medical Hematologist/Oncologist [place] Pager: [phone] Office No: [phone]

## 2013-09-30 ENCOUNTER — Other Ambulatory Visit: Payer: Self-pay

## 2013-09-30 ENCOUNTER — Telehealth: Payer: Self-pay

## 2013-09-30 ENCOUNTER — Telehealth: Payer: Self-pay | Admitting: *Deleted

## 2013-09-30 DIAGNOSIS — C3490 Malignant neoplasm of unspecified part of unspecified bronchus or lung: Secondary | ICD-10-CM

## 2013-09-30 NOTE — Telephone Encounter (Signed)
[name] called asking for fluids prior to next chemo treatment b/c pt tolerates chemo better that way. POF to scheduler, in basket to [place].

## 2013-09-30 NOTE — Telephone Encounter (Signed)
S/w wife that [name] called earlier asking for fluids before next round of chemo. Mady Gemma date and time.

## 2013-09-30 NOTE — Telephone Encounter (Signed)
Per [facility] staff message scheduled appts. Advised scheduler 

## 2013-10-03 ENCOUNTER — Ambulatory Visit (HOSPITAL_BASED_OUTPATIENT_CLINIC_OR_DEPARTMENT_OTHER): Payer: BC Managed Care – PPO

## 2013-10-03 ENCOUNTER — Ambulatory Visit: Payer: BC Managed Care – PPO

## 2013-10-03 VITALS — BP 105/60 | HR 67

## 2013-10-03 DIAGNOSIS — I959 Hypotension, unspecified: Secondary | ICD-10-CM

## 2013-10-03 DIAGNOSIS — E860 Dehydration: Secondary | ICD-10-CM

## 2013-10-03 DIAGNOSIS — C7B8 Other secondary neuroendocrine tumors: Secondary | ICD-10-CM

## 2013-10-03 DIAGNOSIS — C7A1 Malignant poorly differentiated neuroendocrine tumors: Secondary | ICD-10-CM

## 2013-10-03 DIAGNOSIS — C3490 Malignant neoplasm of unspecified part of unspecified bronchus or lung: Secondary | ICD-10-CM

## 2013-10-03 MED ORDER — HEPARIN SOD (PORK) LOCK FLUSH 100 UNIT/ML IV SOLN
500.0000 [IU] | Freq: Once | INTRAVENOUS | Status: AC
  Administered 2013-10-03: 500 [IU] via INTRAVENOUS
  Filled 2013-10-03: qty 5

## 2013-10-03 MED ORDER — SODIUM CHLORIDE 0.9 % IV SOLN
INTRAVENOUS | Status: AC
  Administered 2013-10-03: 14:00:00 via INTRAVENOUS

## 2013-10-03 MED ORDER — SODIUM CHLORIDE 0.9 % IJ SOLN
10.0000 mL | INTRAMUSCULAR | Status: DC | PRN
  Administered 2013-10-03: 10 mL via INTRAVENOUS
  Filled 2013-10-03: qty 10

## 2013-10-03 NOTE — Patient Instructions (Signed)
Dehydration, Adult Dehydration is when you lose more fluids from the body than you take in. Vital organs like the kidneys, brain, and heart cannot function without a proper amount of fluids and salt. Any loss of fluids from the body can cause dehydration.  CAUSES   Vomiting.  Diarrhea.  Excessive sweating.  Excessive urine output.  Fever. SYMPTOMS  Mild dehydration  Thirst.  Dry lips.  Slightly dry mouth. Moderate dehydration  Very dry mouth.  Sunken eyes.  Skin does not bounce back quickly when lightly pinched and released.  Dark urine and decreased urine production.  Decreased tear production.  Headache. Severe dehydration  Very dry mouth.  Extreme thirst.  Rapid, weak pulse (more than 100 beats per minute at rest).  Cold hands and feet.  Not able to sweat in spite of heat and temperature.  Rapid breathing.  Blue lips.  Confusion and lethargy.  Difficulty being awakened.  Minimal urine production.  No tears. DIAGNOSIS  Your caregiver will diagnose dehydration based on your symptoms and your exam. Blood and urine tests will help confirm the diagnosis. The diagnostic evaluation should also identify the cause of dehydration. TREATMENT  Treatment of mild or moderate dehydration can often be done at home by increasing the amount of fluids that you drink. It is best to drink small amounts of fluid more often. Drinking too much at one time can make vomiting worse. Refer to the home care instructions below. Severe dehydration needs to be treated at the hospital where you will probably be given intravenous (IV) fluids that contain water and electrolytes. HOME CARE INSTRUCTIONS   Ask your caregiver about specific rehydration instructions.  Drink enough fluids to keep your urine clear or pale yellow.  Drink small amounts frequently if you have nausea and vomiting.  Eat as you normally do.  Avoid:  Foods or drinks high in sugar.  Carbonated  drinks.  Juice.  Extremely hot or cold fluids.  Drinks with caffeine.  Fatty, greasy foods.  Alcohol.  Tobacco.  Overeating.  Gelatin desserts.  Wash your hands well to avoid spreading bacteria and viruses.  Only take over-the-counter or prescription medicines for pain, discomfort, or fever as directed by your caregiver.  Ask your caregiver if you should continue all prescribed and over-the-counter medicines.  Keep all follow-up appointments with your caregiver. SEEK MEDICAL CARE IF:  You have abdominal pain and it increases or stays in one area (localizes).  You have a rash, stiff neck, or severe headache.  You are irritable, sleepy, or difficult to awaken.  You are weak, dizzy, or extremely thirsty. SEEK IMMEDIATE MEDICAL CARE IF:   You are unable to keep fluids down or you get worse despite treatment.  You have frequent episodes of vomiting or diarrhea.  You have blood or green matter (bile) in your vomit.  You have blood in your stool or your stool looks black and tarry.  You have not urinated in 6 to 8 hours, or you have only urinated a small amount of very dark urine.  You have a fever.  You faint. MAKE SURE YOU:   Understand these instructions.  Will watch your condition.  Will get help right away if you are not doing well or get worse. Document Released: 02/10/2005 Document Revised: 05/05/2011 Document Reviewed: 09/30/2010 Exit[employer] Patient Information 2015 [employer]. This information is not intended to replace advice given to you by your health care provider. Make sure you discuss any questions you have with your health care   provider.  

## 2013-10-04 ENCOUNTER — Telehealth: Payer: Self-pay | Admitting: Hematology

## 2013-10-04 NOTE — Telephone Encounter (Signed)
ADDED LB/FU FOR 8/19 PER 8/6 POF. S/W PT HE IS AWARE AND WILL GET NEW SCHEDULE TOMORROW. CHISM PT.

## 2013-10-05 ENCOUNTER — Ambulatory Visit (HOSPITAL_COMMUNITY)
Admission: RE | Admit: 2013-10-05 | Discharge: 2013-10-05 | Disposition: A | Discharge status: Home / Self Care | Payer: BC Managed Care – PPO | Source: Ambulatory Visit | Attending: Internal Medicine | Admitting: Internal Medicine

## 2013-10-05 ENCOUNTER — Other Ambulatory Visit (HOSPITAL_BASED_OUTPATIENT_CLINIC_OR_DEPARTMENT_OTHER): Payer: BC Managed Care – PPO

## 2013-10-05 ENCOUNTER — Other Ambulatory Visit: Payer: BC Managed Care – PPO

## 2013-10-05 ENCOUNTER — Ambulatory Visit: Payer: BC Managed Care – PPO

## 2013-10-05 ENCOUNTER — Other Ambulatory Visit: Payer: Self-pay | Admitting: Internal Medicine

## 2013-10-05 ENCOUNTER — Ambulatory Visit (HOSPITAL_BASED_OUTPATIENT_CLINIC_OR_DEPARTMENT_OTHER): Payer: BC Managed Care – PPO

## 2013-10-05 ENCOUNTER — Telehealth: Payer: Self-pay | Admitting: *Deleted

## 2013-10-05 ENCOUNTER — Telehealth: Payer: Self-pay | Admitting: Hematology

## 2013-10-05 VITALS — BP 109/67 | HR 68 | Temp 97.7°F | Resp 16

## 2013-10-05 DIAGNOSIS — R059 Cough, unspecified: Secondary | ICD-10-CM | POA: Insufficient documentation

## 2013-10-05 DIAGNOSIS — C7A1 Malignant poorly differentiated neuroendocrine tumors: Secondary | ICD-10-CM

## 2013-10-05 DIAGNOSIS — R0602 Shortness of breath: Secondary | ICD-10-CM | POA: Insufficient documentation

## 2013-10-05 DIAGNOSIS — R5381 Other malaise: Secondary | ICD-10-CM

## 2013-10-05 DIAGNOSIS — C3490 Malignant neoplasm of unspecified part of unspecified bronchus or lung: Secondary | ICD-10-CM

## 2013-10-05 DIAGNOSIS — R05 Cough: Secondary | ICD-10-CM

## 2013-10-05 DIAGNOSIS — R5383 Other fatigue: Secondary | ICD-10-CM

## 2013-10-05 DIAGNOSIS — Z85118 Personal history of other malignant neoplasm of bronchus and lung: Secondary | ICD-10-CM | POA: Diagnosis not present

## 2013-10-05 DIAGNOSIS — C7B8 Other secondary neuroendocrine tumors: Secondary | ICD-10-CM

## 2013-10-05 DIAGNOSIS — R058 Other specified cough: Secondary | ICD-10-CM

## 2013-10-05 LAB — CBC WITH DIFFERENTIAL/PLATELET
BASO%: 0.5 % (ref 0.0–2.0)
Basophils Absolute: 0 10*3/uL (ref 0.0–0.1)
EOS%: 1.6 % (ref 0.0–7.0)
Eosinophils Absolute: 0.1 10*3/uL (ref 0.0–0.5)
HCT: 31.9 % — ABNORMAL LOW (ref 38.4–49.9)
HGB: 10.5 g/dL — ABNORMAL LOW (ref 13.0–17.1)
LYMPH%: 7.5 % — ABNORMAL LOW (ref 14.0–49.0)
MCH: 33.3 pg (ref 27.2–33.4)
MCHC: 32.8 g/dL (ref 32.0–36.0)
MCV: 101.6 fL — ABNORMAL HIGH (ref 79.3–98.0)
MONO#: 1 10*3/uL — ABNORMAL HIGH (ref 0.1–0.9)
MONO%: 14.1 % — ABNORMAL HIGH (ref 0.0–14.0)
NEUT#: 5.4 10*3/uL (ref 1.5–6.5)
NEUT%: 76.3 % — ABNORMAL HIGH (ref 39.0–75.0)
Platelets: 415 10*3/uL — ABNORMAL HIGH (ref 140–400)
RBC: 3.14 10*6/uL — ABNORMAL LOW (ref 4.20–5.82)
RDW: 19.1 % — ABNORMAL HIGH (ref 11.0–14.6)
WBC: 7 10*3/uL (ref 4.0–10.3)
lymph#: 0.5 10*3/uL — ABNORMAL LOW (ref 0.9–3.3)

## 2013-10-05 MED ORDER — AZITHROMYCIN 250 MG PO TABS: ORAL_TABLET | ORAL | Status: DC

## 2013-10-05 MED ORDER — SODIUM CHLORIDE 0.9 % IV SOLN
INTRAVENOUS | Status: DC
  Administered 2013-10-05: 13:00:00 via INTRAVENOUS

## 2013-10-05 MED ORDER — SODIUM CHLORIDE 0.9 % IJ SOLN
10.0000 mL | INTRAMUSCULAR | Status: DC | PRN
  Administered 2013-10-05: 10 mL via INTRAVENOUS
  Filled 2013-10-05: qty 10

## 2013-10-05 MED ORDER — HEPARIN SOD (PORK) LOCK FLUSH 100 UNIT/ML IV SOLN
500.0000 [IU] | Freq: Once | INTRAVENOUS | Status: AC
  Administered 2013-10-05: 500 [IU] via INTRAVENOUS
  Filled 2013-10-05: qty 5

## 2013-10-05 NOTE — Patient Instructions (Signed)
Dehydration, Adult Dehydration is when you lose more fluids from the body than you take in. Vital organs like the kidneys, brain, and heart cannot function without a proper amount of fluids and salt. Any loss of fluids from the body can cause dehydration.  CAUSES   Vomiting.  Diarrhea.  Excessive sweating.  Excessive urine output.  Fever. SYMPTOMS  Mild dehydration  Thirst.  Dry lips.  Slightly dry mouth. Moderate dehydration  Very dry mouth.  Sunken eyes.  Skin does not bounce back quickly when lightly pinched and released.  Dark urine and decreased urine production.  Decreased tear production.  Headache. Severe dehydration  Very dry mouth.  Extreme thirst.  Rapid, weak pulse (more than 100 beats per minute at rest).  Cold hands and feet.  Not able to sweat in spite of heat and temperature.  Rapid breathing.  Blue lips.  Confusion and lethargy.  Difficulty being awakened.  Minimal urine production.  No tears. DIAGNOSIS  Your caregiver will diagnose dehydration based on your symptoms and your exam. Blood and urine tests will help confirm the diagnosis. The diagnostic evaluation should also identify the cause of dehydration. TREATMENT  Treatment of mild or moderate dehydration can often be done at home by increasing the amount of fluids that you drink. It is best to drink small amounts of fluid more often. Drinking too much at one time can make vomiting worse. Refer to the home care instructions below. Severe dehydration needs to be treated at the hospital where you will probably be given intravenous (IV) fluids that contain water and electrolytes. HOME CARE INSTRUCTIONS   Ask your caregiver about specific rehydration instructions.  Drink enough fluids to keep your urine clear or pale yellow.  Drink small amounts frequently if you have nausea and vomiting.  Eat as you normally do.  Avoid:  Foods or drinks high in sugar.  Carbonated  drinks.  Juice.  Extremely hot or cold fluids.  Drinks with caffeine.  Fatty, greasy foods.  Alcohol.  Tobacco.  Overeating.  Gelatin desserts.  Wash your hands well to avoid spreading bacteria and viruses.  Only take over-the-counter or prescription medicines for pain, discomfort, or fever as directed by your caregiver.  Ask your caregiver if you should continue all prescribed and over-the-counter medicines.  Keep all follow-up appointments with your caregiver. SEEK MEDICAL CARE IF:  You have abdominal pain and it increases or stays in one area (localizes).  You have a rash, stiff neck, or severe headache.  You are irritable, sleepy, or difficult to awaken.  You are weak, dizzy, or extremely thirsty. SEEK IMMEDIATE MEDICAL CARE IF:   You are unable to keep fluids down or you get worse despite treatment.  You have frequent episodes of vomiting or diarrhea.  You have blood or green matter (bile) in your vomit.  You have blood in your stool or your stool looks black and tarry.  You have not urinated in 6 to 8 hours, or you have only urinated a small amount of very dark urine.  You have a fever.  You faint. MAKE SURE YOU:   Understand these instructions.  Will watch your condition.  Will get help right away if you are not doing well or get worse. Document Released: 02/10/2005 Document Revised: 05/05/2011 Document Reviewed: 09/30/2010 Exit[employer] Patient Information 2015 [employer]. This information is not intended to replace advice given to you by your health care provider. Make sure you discuss any questions you have with your health care   provider.  

## 2013-10-05 NOTE — Telephone Encounter (Signed)
Per staff message and [facility] I have scheduled appts. Advised scheduler of appts. JMW  

## 2013-10-05 NOTE — Telephone Encounter (Signed)
per pof to r/s labs appt & inj-gave daughter [name] copy of  sch

## 2013-10-05 NOTE — Progress Notes (Signed)
Patient complains of a productive cough, yellow in color and thick. Patient states his SOB has been increased and he has noticed being more fatigued then usual. Patient's daughter states the sputum color has became more yellow today then two days ago. Patient denies dizziness or lightheadedness. Dr. Juliann Mule notified and at chairside.  Hold chemotherapy one week, collect sputum sample and give 1 liter normal saline over 2 hours per Dr. Juliann Mule.

## 2013-10-06 ENCOUNTER — Ambulatory Visit: Payer: BC Managed Care – PPO

## 2013-10-06 ENCOUNTER — Telehealth: Payer: Self-pay

## 2013-10-06 ENCOUNTER — Other Ambulatory Visit: Payer: BC Managed Care – PPO

## 2013-10-06 NOTE — Telephone Encounter (Signed)
Returning debbies call from 0930. Pt does have pneumonia per CXR yesterday. He received Z-pack for this 8/12. He does not need any blood products at present. Call tomorrow if antibiotics don't start helping.

## 2013-10-07 ENCOUNTER — Ambulatory Visit: Payer: BC Managed Care – PPO

## 2013-10-07 ENCOUNTER — Other Ambulatory Visit: Payer: Self-pay | Admitting: Internal Medicine

## 2013-10-07 ENCOUNTER — Telehealth: Payer: Self-pay

## 2013-10-07 DIAGNOSIS — J189 Pneumonia, unspecified organism: Secondary | ICD-10-CM

## 2013-10-07 MED ORDER — LEVOFLOXACIN 750 MG PO TABS: 750.0000 mg | ORAL_TABLET | Freq: Every day | ORAL | Status: DC

## 2013-10-07 NOTE — Telephone Encounter (Signed)
Ricky Villanueva had called earlier that pt did not feel worse but he also did not feel better. S/w bonnie that Dr Juliann Mule prescribed levaquin. Take the levaquin starting today. Stop the z-pack, it is OK if he already took today's dose.

## 2013-10-08 ENCOUNTER — Ambulatory Visit: Payer: BC Managed Care – PPO

## 2013-10-11 ENCOUNTER — Telehealth: Payer: Self-pay | Admitting: Dietician

## 2013-10-11 NOTE — Telephone Encounter (Signed)
Brief Outpatient Oncology Nutrition Note  Patient has been identified to be at risk on malnutrition screen.  Wt Readings from Last 10 Encounters:  09/29/13 99 lb 9.6 oz (45.178 kg)  09/28/13 95 lb 6 oz (43.262 kg)  09/02/13 102 lb (46.267 kg)  09/01/13 97 lb 11.2 oz (44.316 kg)  08/03/13 99 lb 1.6 oz (44.951 kg)  07/13/13 103 lb (46.72 kg)  06/30/13 102 lb 8 oz (46.494 kg)  06/29/13 102 lb 8 oz (46.494 kg)  06/22/13 108 lb 3.2 oz (49.079 kg)  06/20/13 105 lb 9.6 oz (47.9 kg)     Dx:  Small cell carcinoma of lung.  Patient of Dr. Juliann Mule  Called patient secondary to low weight.  Patient has seen the [facility] in May.  Patient was not available.  Message left to contact the [facility] as needed with provided number.  Antonieta Iba, RD, LDN

## 2013-10-12 ENCOUNTER — Other Ambulatory Visit (HOSPITAL_BASED_OUTPATIENT_CLINIC_OR_DEPARTMENT_OTHER): Payer: BC Managed Care – PPO

## 2013-10-12 ENCOUNTER — Ambulatory Visit (HOSPITAL_BASED_OUTPATIENT_CLINIC_OR_DEPARTMENT_OTHER): Payer: BC Managed Care – PPO | Admitting: Internal Medicine

## 2013-10-12 ENCOUNTER — Ambulatory Visit (HOSPITAL_BASED_OUTPATIENT_CLINIC_OR_DEPARTMENT_OTHER): Payer: BC Managed Care – PPO

## 2013-10-12 ENCOUNTER — Telehealth: Payer: Self-pay | Admitting: Internal Medicine

## 2013-10-12 VITALS — BP 108/82 | HR 101 | Temp 97.5°F | Resp 17 | Ht 67.0 in | Wt 93.6 lb

## 2013-10-12 DIAGNOSIS — I2699 Other pulmonary embolism without acute cor pulmonale: Secondary | ICD-10-CM

## 2013-10-12 DIAGNOSIS — C3490 Malignant neoplasm of unspecified part of unspecified bronchus or lung: Secondary | ICD-10-CM

## 2013-10-12 DIAGNOSIS — E860 Dehydration: Secondary | ICD-10-CM

## 2013-10-12 DIAGNOSIS — C7931 Secondary malignant neoplasm of brain: Secondary | ICD-10-CM

## 2013-10-12 DIAGNOSIS — C3491 Malignant neoplasm of unspecified part of right bronchus or lung: Secondary | ICD-10-CM

## 2013-10-12 DIAGNOSIS — C7949 Secondary malignant neoplasm of other parts of nervous system: Secondary | ICD-10-CM

## 2013-10-12 DIAGNOSIS — R630 Anorexia: Secondary | ICD-10-CM

## 2013-10-12 DIAGNOSIS — Z5111 Encounter for antineoplastic chemotherapy: Secondary | ICD-10-CM

## 2013-10-12 DIAGNOSIS — I959 Hypotension, unspecified: Secondary | ICD-10-CM

## 2013-10-12 DIAGNOSIS — Z95828 Presence of other vascular implants and grafts: Secondary | ICD-10-CM

## 2013-10-12 DIAGNOSIS — Z452 Encounter for adjustment and management of vascular access device: Secondary | ICD-10-CM

## 2013-10-12 DIAGNOSIS — Z7901 Long term (current) use of anticoagulants: Secondary | ICD-10-CM

## 2013-10-12 DIAGNOSIS — D630 Anemia in neoplastic disease: Secondary | ICD-10-CM

## 2013-10-12 DIAGNOSIS — E43 Unspecified severe protein-calorie malnutrition: Secondary | ICD-10-CM

## 2013-10-12 LAB — CBC WITH DIFFERENTIAL/PLATELET
BASO%: 0.7 % (ref 0.0–2.0)
Basophils Absolute: 0 10*3/uL (ref 0.0–0.1)
EOS%: 1.2 % (ref 0.0–7.0)
Eosinophils Absolute: 0.1 10*3/uL (ref 0.0–0.5)
HCT: 32.7 % — ABNORMAL LOW (ref 38.4–49.9)
HGB: 10.9 g/dL — ABNORMAL LOW (ref 13.0–17.1)
LYMPH%: 8.8 % — AB (ref 14.0–49.0)
MCH: 33.4 pg (ref 27.2–33.4)
MCHC: 33.4 g/dL (ref 32.0–36.0)
MCV: 99.7 fL — AB (ref 79.3–98.0)
MONO#: 0.8 10*3/uL (ref 0.1–0.9)
MONO%: 10.8 % (ref 0.0–14.0)
NEUT#: 5.5 10*3/uL (ref 1.5–6.5)
NEUT%: 78.5 % — AB (ref 39.0–75.0)
PLATELETS: 383 10*3/uL (ref 140–400)
RBC: 3.28 10*6/uL — AB (ref 4.20–5.82)
RDW: 17.8 % — ABNORMAL HIGH (ref 11.0–14.6)
WBC: 7 10*3/uL (ref 4.0–10.3)
lymph#: 0.6 10*3/uL — ABNORMAL LOW (ref 0.9–3.3)

## 2013-10-12 LAB — COMPREHENSIVE METABOLIC PANEL (CC13)
ALK PHOS: 94 U/L (ref 40–150)
ALT: 13 U/L (ref 0–55)
AST: 16 U/L (ref 5–34)
Albumin: 3.2 g/dL — ABNORMAL LOW (ref 3.5–5.0)
Anion Gap: 9 mEq/L (ref 3–11)
BILIRUBIN TOTAL: 0.27 mg/dL (ref 0.20–1.20)
BUN: 15.5 mg/dL (ref 7.0–26.0)
CO2: 27 mEq/L (ref 22–29)
CREATININE: 0.9 mg/dL (ref 0.7–1.3)
Calcium: 9.7 mg/dL (ref 8.4–10.4)
Chloride: 103 mEq/L (ref 98–109)
Glucose: 104 mg/dl (ref 70–140)
Potassium: 4 mEq/L (ref 3.5–5.1)
Sodium: 139 mEq/L (ref 136–145)
TOTAL PROTEIN: 6.9 g/dL (ref 6.4–8.3)

## 2013-10-12 MED ORDER — SODIUM CHLORIDE 0.9 % IV SOLN
Freq: Once | INTRAVENOUS | Status: AC
  Administered 2013-10-12: 14:00:00 via INTRAVENOUS

## 2013-10-12 MED ORDER — SODIUM CHLORIDE 0.9 % IV SOLN: Freq: Once | INTRAVENOUS | Status: DC

## 2013-10-12 MED ORDER — SODIUM CHLORIDE 0.9 % IV SOLN
75.0000 mg/m2 | Freq: Once | INTRAVENOUS | Status: AC
  Administered 2013-10-12: 110 mg via INTRAVENOUS
  Filled 2013-10-12: qty 5.5

## 2013-10-12 MED ORDER — HEPARIN SOD (PORK) LOCK FLUSH 100 UNIT/ML IV SOLN
500.0000 [IU] | Freq: Once | INTRAVENOUS | Status: AC | PRN
  Administered 2013-10-12: 500 [IU]
  Filled 2013-10-12: qty 5

## 2013-10-12 MED ORDER — CARBOPLATIN CHEMO INJECTION 450 MG/45ML
260.0000 mg | Freq: Once | INTRAVENOUS | Status: AC
  Administered 2013-10-12: 260 mg via INTRAVENOUS
  Filled 2013-10-12: qty 26

## 2013-10-12 MED ORDER — SODIUM CHLORIDE 0.9 % IV SOLN
Freq: Once | INTRAVENOUS | Status: AC
  Administered 2013-10-12: 15:00:00 via INTRAVENOUS

## 2013-10-12 MED ORDER — ONDANSETRON 16 MG/50ML IVPB (CHCC)
INTRAVENOUS | Status: AC
  Filled 2013-10-12: qty 16

## 2013-10-12 MED ORDER — SODIUM CHLORIDE 0.9 % IJ SOLN
10.0000 mL | INTRAMUSCULAR | Status: DC | PRN
  Administered 2013-10-12: 10 mL
  Filled 2013-10-12: qty 10

## 2013-10-12 MED ORDER — ONDANSETRON 16 MG/50ML IVPB (CHCC)
16.0000 mg | Freq: Once | INTRAVENOUS | Status: AC
  Administered 2013-10-12: 16 mg via INTRAVENOUS

## 2013-10-12 MED ORDER — SODIUM CHLORIDE 0.9 % IJ SOLN
10.0000 mL | INTRAMUSCULAR | Status: DC | PRN
  Administered 2013-10-12: 10 mL via INTRAVENOUS
  Filled 2013-10-12: qty 10

## 2013-10-12 NOTE — Progress Notes (Signed)
[facility] OFFICE PROGRESS NOTE  [name], MD [place] [address]  DIAGNOSIS: Small cell carcinoma of lung, unspecified laterality - Plan: CBC with Differential, Lactate dehydrogenase (LDH) - [facility], Comprehensive metabolic panel (Cmet) - [facility], CT Chest W Contrast, CT Abdomen Pelvis W Contrast  Anorexia - Plan: CBC with Differential, Lactate dehydrogenase (LDH) - [facility], Comprehensive metabolic panel (Cmet) - [facility], 0.9 %  sodium chloride infusion, 0.9 %  sodium chloride infusion, DISCONTINUED: 0.9 %  sodium chloride infusion  Protein-calorie malnutrition, severe - Plan: CBC with Differential, Lactate dehydrogenase (LDH) - [facility], Comprehensive metabolic panel (Cmet) - [facility]  Chief Complaint  Patient presents with  . Follow-up    CURRENT TREATMENT: Cycle # 1 of Carboplatin (on day#1 of 260 mg)  plus etoposide (75 mg/m2 daily on 04/29, 04/30 and 5/1) started on 06/22/2013.  He completed neulasta on 06/25/2013.  Cycle #2 of Carboplatin (on day#1 of 260 mg)  plus etoposide (75 mg/m2 daily on 05/20, 05/21 and 5/22).  He completed neulasta on 07/16/2013.  He completed cycle 3 of carboplatin (on day 1 of 260 mg) plus etoposide (75 mg/m2 daily on 6/17, 6/18, and 6/19) and receipt of neulasta on 08/13/2013. We will plan 5 cycle of Carboplatin plus etoposide for a total therapy of 6 cycles (including the cisplatin-based chemotherapy) per NCCN guidelines.   PRIOR TREATMENT:  WB XRT started on 03/10 - 03/25 per Dr. Pablo Ledger. He completed one cycle of cisplatin (79 mg/m2 on day #1) plus etoposide (100 mg/m2 on 3/25, 3/26 and 05/20/2013.  He is scheduled for cycle #4 of Carbo plus etoposide starting 09/07/2013.   INTERVAL HISTORY: Ricky Villanueva 63 y.o. male with a history of small cell lung cancer is here for follow up. He was last seen by me on 06/102015.  His past medical history also includes HTN/Hyperlipidemia and longstanding smoking history but quit 12/25/2011 presented  to ER on 04/21/13 with worsening dyspnea, cough with streaks of blood over the past one week. In the ED, his oxygen saturation was 91% on 2 L nasal canula. CXR revealed a progressive increased density in the right lower lobe consistent with worsening atelectasis or pneumonia and a soft tissue fullness in the hilar region suspicious for lymphadenopathy or mass.and follow-up CT of chest was recommended. It revealed a nonocclusive left upper lobe, right lower lobe pulmonary emboli with right heart strain. Bulky mediastinal and right hilar lymphadenopathy, encasing the of pulmonary arteries, pulmonary veins and bronchi, completely effacing right lower lobe segmental bronchi. Extensive consolidation in the right lung suggested postobstructive pneumonia, with interstitial prominence which may reflect superimposed lymphangitic spread of infection or neoplasm. He was admitted and started on antibiotics and heparin. Pulmonary was consulted and he had a video bronchoscopy on 03/03 with consistent with small cell ca of lung. 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 He had WBXRT as noted above. He then received his first cycle of chemotherapy on 03/25 thru 03/27 and was admitted 04/01 and discharged on 05/31/13 due to dehydration, acute renal failure, mucositis and subsequently had febrile neutropenia. We restarted the Xarelto which held due to profound thrombocytopenia.  Today, he is accompanied by his wife, daughter. He reports feeling fatigued and lightheaded when standing. He reports improved appetite on remeron.  He completed treatment for pneumonia and his cycle #5 chemotherapy was held.  MEDICAL HISTORY: Past Medical History  Diagnosis Date  . Hyperlipidemia   . Hypertension   . GERD (gastroesophageal reflux disease)   . Respiratory failure with hypoxia 04/21/2013    secondary to  pneumonia/notes 04/21/2013  . Pulmonary embolism     "got one in there now" (04/21/2013)  . COPD (chronic obstructive pulmonary disease)   . Pneumonia 2/?01/2014    "wouldn't get better; hospitalized 04/21/2013)  . Arthritis     "minor in my right hand" (04/21/2013)  . Cancer     lung ca dx'd 03/2013  . History of radiation therapy 05/03/13-05/18/13    chest & whole brain    INTERIM HISTORY: has GERD (gastroesophageal reflux disease); HCAP (healthcare-associated pneumonia); Respiratory failure with hypoxia; HTN (hypertension); Acute pulmonary embolism; Lung mass; Emphysema lung; Small cell carcinoma of lung; Acute renal failure; Protein-calorie malnutrition, severe; and Anorexia on his problem list.    ALLERGIES:  is allergic to decadron and augmentin.  MEDICATIONS: has a current medication list which includes the following prescription(s): albuterol, docusate sodium, levofloxacin, lidocaine-prilocaine, lorazepam, mirtazapine, morphine, nystatin, omeprazole-sodium bicarbonate, ondansetron, oxycodone, polyethylene glycol powder, prochlorperazine, rivaroxaban, and tiotropium, and the following Facility-Administered Medications: sodium chloride and sodium chloride.  SURGICAL HISTORY:  Past Surgical History  Procedure Laterality Date  . Inguinal hernia repair Right ~ 2005  . Finger surgery Left ~ 1989    "crushed end of my middle finger off"   . Video bronchoscopy Bilateral 04/26/2013    Procedure: VIDEO BRONCHOSCOPY WITH FLUORO;  Surgeon: Wilhelmina Mcardle, MD;  Location: [facility] ENDOSCOPY;  Service: Cardiopulmonary;  Laterality: Bilateral;    REVIEW OF SYSTEMS:   Constitutional: Denies fevers, chills or abnormal weight loss Eyes: Denies blurriness of vision Ears, nose, mouth, throat, and face: Denies mucositis or sore throat Respiratory: Denies cough, dyspnea or wheezes Cardiovascular: Denies palpitation, chest discomfort or lower extremity swelling Gastrointestinal:  Denies nausea, heartburn or  change in bowel habits Skin: Denies abnormal skin rashes Lymphatics: Denies new lymphadenopathy or easy bruising Neurological:Denies numbness, tingling or new weaknesses Behavioral/Psych: Mood is stable, no new changes  All other systems were reviewed with the patient and are negative.  PHYSICAL EXAMINATION: ECOG PERFORMANCE STATUS: 1 - Symptomatic but completely ambulatory  Blood pressure 108/82, pulse 101, temperature 97.5 F (36.4 C), temperature source Oral, resp. rate 17, height 5\' 7"  (1.702 m), weight 93 lb 9.6 oz (42.457 kg), SpO2 99.00%, peak flow 2 L/min.  GENERAL:alert, no distress and comfortable; HOH; chronically ill appearing, alopecia.  SKIN: skin color, texture, turgor are normal, no rashes or significant lesions EYES: normal, Conjunctiva are pink and non-injected, sclera clear OROPHARYNX:no exudate, no erythema and lips, buccal mucosa, and tongue normal  NECK: supple, thyroid normal size, non-tender, without nodularity LYMPH:  no palpable lymphadenopathy in the cervical, axillary or supraclavicular LUNGS: clear to auscultation with normal breathing effort, no wheezes or rhonchi HEART: regular rate & rhythm and no murmurs and no lower extremity edema ABDOMEN:abdomen soft, non-tender and normal bowel sounds Musculoskeletal:no cyanosis of digits and no clubbing  NEURO: alert & oriented x 3 with fluent speech, no focal motor/sensory deficits; Cranial nerves II - XII intact.  Negative rhomberge.  Strength 5/5 in upper and lower extremities bilaterally.  Gait, normal.   Labs:  Lab Results  Component Value Date   WBC 7.0 10/12/2013   HGB 10.9* 10/12/2013   HCT 32.7* 10/12/2013   MCV 99.7* 10/12/2013   PLT 383 10/12/2013   NEUTROABS 5.5 10/12/2013      Chemistry  Component Value Date/Time   NA 139 10/12/2013 1242   NA 139 08/01/2013 1101   K 4.0 10/12/2013 1242   K 4.0 08/01/2013 1101   CL 98 08/01/2013 1101   CO2 27 10/12/2013 1242   CO2 26 08/01/2013 1101   BUN 15.5  10/12/2013 1242   BUN 8 08/01/2013 1101   CREATININE 0.9 10/12/2013 1242   CREATININE 0.92 08/01/2013 1101      Component Value Date/Time   CALCIUM 9.7 10/12/2013 1242   CALCIUM 9.2 08/01/2013 1101   ALKPHOS 94 10/12/2013 1242   ALKPHOS 70 05/26/2013 0405   AST 16 10/12/2013 1242   AST 12 05/26/2013 0405   ALT 13 10/12/2013 1242   ALT 12 05/26/2013 0405   BILITOT 0.27 10/12/2013 1242   BILITOT 0.5 05/26/2013 0405     CBC:  Recent Labs Lab 10/12/13 1242  WBC 7.0  NEUTROABS 5.5  HGB 10.9*  HCT 32.7*  MCV 99.7*  PLT 383    Anemia work up No results found for this basename: VITAMINB12, FOLATE, FERRITIN, TIBC, IRON, RETICCTPCT,  in the last 72 hours  Studies:  MRI of brain 08/30/2013 IMPRESSION:  Complete resolution of the tiny areas of enhancement in the right parietal lobe and right paramedian brainstem. No mass effect or significant edema. Mild increase in small vessel disease versus post treatment effect.   CHEST 2 VIEW  COMPARISON: 08/01/2013  FINDINGS:  Right Port-A-Cath has been placed with the tip in the SVC. There is hyperinflation of the lungs compatible with COPD. Increasing right infrahilar opacity could reflect pneumonia. Left lung is clear.  Heart is normal size. No effusions or acute bony abnormality.  IMPRESSION: Vague right infrahilar opacity, new since prior study concerning for possible pneumonia.   ASSESSMENT: Ricky Villanueva 63 y.o. male with a history of Small cell carcinoma of lung, unspecified laterality - Plan: CBC with Differential, Lactate dehydrogenase (LDH) - [facility], Comprehensive metabolic panel (Cmet) - [facility], CT Chest W Contrast, CT Abdomen Pelvis W Contrast  Anorexia - Plan: CBC with Differential, Lactate dehydrogenase (LDH) - [facility], Comprehensive metabolic panel (Cmet) - [facility], 0.9 %  sodium chloride infusion, 0.9 %  sodium chloride infusion, DISCONTINUED: 0.9 %  sodium chloride infusion  Protein-calorie malnutrition, severe - Plan: CBC with Differential, Lactate  dehydrogenase (LDH) - [facility], Comprehensive metabolic panel (Cmet) - [facility]   PLAN:  1. 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 Pablo Ledger completed whole brain XRT on 03/25. We started chemotherapy on 03/25 and he completed Cycle #1 On cisplatin 80 mg/m2 on day 1, etoposide 100 mg/m2 days 1,2,3. He had toxicity related to diminished hearing and hospitalization.  He has been evaluated by Dr. Constance Holster who also recommends hearing aides.    --Given his complains of hearing fullness and intermittent tinnitus, we discontinued cisplatin and substituted it with carboplatin AUC 3. In addition, we will decrease etoposide dose by 25% due to profound neutropenia. We will also continue neulasta of Day#4 of his chemotherapy each cycle due to febrile neutropenia. Plan continue chemotherapy in today for his final induction cycle #5, day #1 of Carboplatin plus etoposide.  He still reports gastritis/esophagitis and anorexia. He will complete a total of 5 cycles of carboplatin plus etoposide (dose reduced).  He has already had one cycle of cisplatin plus etoposide as noted above. NCCN guidelines support 4-6 cycles of induction therapy followed by observation.   --We obtained a CT of abdomen and pelvis and chest (  06/09) given above symptoms to assess response to therapy following 2 cycles of therapy which demonstrated an positive interval response to therapy. In addition, Dr. Pablo Ledger ordered an MRI of brain which showed resolution of his brain metastases. We will restage with a CT C/A/P two weeks from today and start observation.    2. History of pneumonia. --S/p treatment with levaquin 750 mg x 5 days; Azithromycin x 3 days. He reports near resolution.   3. Gastritis/esophagitis.  --Continue zegerid OTC.    Seen by GI with a normal barium swallow.   4. Anorexia.  --Likely secondary to #1. He reports not tolerating marinol so we will prescribe Remeron 30 mg qhs instead. His  appetite is improving on remeron.   5. PE (04/21/2013) likely secondary to #1.  --CTA of chest noted above consistent with Nonocclusive left upper lobe, right lower lobe pulmonary emboli. Right heart strain. Continue xarelto 20 mg daily indefinitely.   6. Anemia secondary to #1 plus/minus chemotherapy, symptomatic with fatigue and lightheadedness when standing.  --He is asymptomatic.  His hemoglobin is 10.9.   7. 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 --We DISCONTINUED cisplatin and now patient is on carboplatin.  He has been fitted and received his hearing aids.   8. Dehydration and hypotension.  --We will continue Normal saline prn  9. Emphysema  --Continue spiriva hanihaler 18 mcg inhalation daily. Oxygen 2 liters by [place] prn for moderate exertion.   10. Follow-up.  --Patient will follow up in 3 weeks  for consideration of chemotherapy with carbo plus etoposide cycle #5, day #1.   All questions were answered. The patient knows to call the clinic with any problems, questions or concerns. We can certainly see the patient much sooner if necessary.  I spent 15 minutes counseling the patient face to face. The total time spent in the appointment was 25 minutes.    Giamarie Bueche, MD 10/12/2013 10:44 PM

## 2013-10-12 NOTE — Telephone Encounter (Signed)
gv and printed appt sched and avs for pt for Aug thru Dec

## 2013-10-12 NOTE — Patient Instructions (Signed)

## 2013-10-12 NOTE — Patient Instructions (Signed)
[facility] Discharge Instructions for Patients Receiving Chemotherapy  Today you received the following chemotherapy agents: Carboplatin and Etoposide.  To help prevent nausea and vomiting after your treatment, we encourage you to take your nausea medication as prescribed.   If you develop nausea and vomiting that is not controlled by your nausea medication, call the clinic.   BELOW ARE SYMPTOMS THAT SHOULD BE REPORTED IMMEDIATELY:  *FEVER GREATER THAN 100.5 F  *CHILLS WITH OR WITHOUT FEVER  NAUSEA AND VOMITING THAT IS NOT CONTROLLED WITH YOUR NAUSEA MEDICATION  *UNUSUAL SHORTNESS OF BREATH  *UNUSUAL BRUISING OR BLEEDING  TENDERNESS IN MOUTH AND THROAT WITH OR WITHOUT PRESENCE OF ULCERS  *URINARY PROBLEMS  *BOWEL PROBLEMS  UNUSUAL RASH Items with * indicate a potential emergency and should be followed up as soon as possible.  Feel free to call the clinic you have any questions or concerns. The clinic phone number is [phone].

## 2013-10-13 ENCOUNTER — Ambulatory Visit (HOSPITAL_BASED_OUTPATIENT_CLINIC_OR_DEPARTMENT_OTHER): Payer: BC Managed Care – PPO

## 2013-10-13 ENCOUNTER — Other Ambulatory Visit: Payer: Self-pay | Admitting: Medical Oncology

## 2013-10-13 ENCOUNTER — Other Ambulatory Visit: Payer: BC Managed Care – PPO

## 2013-10-13 VITALS — BP 116/66 | HR 80 | Temp 98.0°F | Resp 18

## 2013-10-13 DIAGNOSIS — C3491 Malignant neoplasm of unspecified part of right bronchus or lung: Secondary | ICD-10-CM

## 2013-10-13 DIAGNOSIS — Z5111 Encounter for antineoplastic chemotherapy: Secondary | ICD-10-CM

## 2013-10-13 DIAGNOSIS — C3490 Malignant neoplasm of unspecified part of unspecified bronchus or lung: Secondary | ICD-10-CM

## 2013-10-13 DIAGNOSIS — C7B8 Other secondary neuroendocrine tumors: Secondary | ICD-10-CM

## 2013-10-13 DIAGNOSIS — C7A1 Malignant poorly differentiated neuroendocrine tumors: Secondary | ICD-10-CM

## 2013-10-13 MED ORDER — PROCHLORPERAZINE MALEATE 10 MG PO TABS
ORAL_TABLET | ORAL | Status: AC
  Filled 2013-10-13: qty 1

## 2013-10-13 MED ORDER — SODIUM CHLORIDE 0.9 % IV SOLN
75.0000 mg/m2 | Freq: Once | INTRAVENOUS | Status: AC
  Administered 2013-10-13: 110 mg via INTRAVENOUS
  Filled 2013-10-13: qty 5.5

## 2013-10-13 MED ORDER — HEPARIN SOD (PORK) LOCK FLUSH 100 UNIT/ML IV SOLN
500.0000 [IU] | Freq: Once | INTRAVENOUS | Status: AC | PRN
  Administered 2013-10-13: 500 [IU]
  Filled 2013-10-13: qty 5

## 2013-10-13 MED ORDER — SODIUM CHLORIDE 0.9 % IV SOLN
Freq: Once | INTRAVENOUS | Status: AC
  Administered 2013-10-13: 12:00:00 via INTRAVENOUS

## 2013-10-13 MED ORDER — SODIUM CHLORIDE 0.9 % IJ SOLN
10.0000 mL | INTRAMUSCULAR | Status: DC | PRN
  Administered 2013-10-13: 10 mL
  Filled 2013-10-13: qty 10

## 2013-10-13 MED ORDER — PROCHLORPERAZINE MALEATE 10 MG PO TABS
10.0000 mg | ORAL_TABLET | Freq: Once | ORAL | Status: AC
  Administered 2013-10-13: 10 mg via ORAL

## 2013-10-13 NOTE — Patient Instructions (Signed)
[facility] Discharge Instructions for Patients Receiving Chemotherapy  Today you received the following chemotherapy agents Etoposide.   To help prevent nausea and vomiting after your treatment, we encourage you to take your nausea medication as directed.    If you develop nausea and vomiting that is not controlled by your nausea medication, call the clinic.   BELOW ARE SYMPTOMS THAT SHOULD BE REPORTED IMMEDIATELY:  *FEVER GREATER THAN 100.5 F  *CHILLS WITH OR WITHOUT FEVER  NAUSEA AND VOMITING THAT IS NOT CONTROLLED WITH YOUR NAUSEA MEDICATION  *UNUSUAL SHORTNESS OF BREATH  *UNUSUAL BRUISING OR BLEEDING  TENDERNESS IN MOUTH AND THROAT WITH OR WITHOUT PRESENCE OF ULCERS  *URINARY PROBLEMS  *BOWEL PROBLEMS  UNUSUAL RASH Items with * indicate a potential emergency and should be followed up as soon as possible.  Feel free to call the clinic you have any questions or concerns. The clinic phone number is [phone].

## 2013-10-14 ENCOUNTER — Telehealth: Payer: Self-pay | Admitting: *Deleted

## 2013-10-14 ENCOUNTER — Other Ambulatory Visit: Payer: Self-pay | Admitting: *Deleted

## 2013-10-14 ENCOUNTER — Other Ambulatory Visit: Payer: BC Managed Care – PPO

## 2013-10-14 ENCOUNTER — Ambulatory Visit (HOSPITAL_BASED_OUTPATIENT_CLINIC_OR_DEPARTMENT_OTHER): Payer: BC Managed Care – PPO

## 2013-10-14 VITALS — BP 106/72 | HR 79 | Temp 98.0°F | Resp 18

## 2013-10-14 DIAGNOSIS — Z5111 Encounter for antineoplastic chemotherapy: Secondary | ICD-10-CM

## 2013-10-14 DIAGNOSIS — C7B8 Other secondary neuroendocrine tumors: Secondary | ICD-10-CM

## 2013-10-14 DIAGNOSIS — C3490 Malignant neoplasm of unspecified part of unspecified bronchus or lung: Secondary | ICD-10-CM

## 2013-10-14 DIAGNOSIS — K1230 Oral mucositis (ulcerative), unspecified: Secondary | ICD-10-CM

## 2013-10-14 DIAGNOSIS — C7A1 Malignant poorly differentiated neuroendocrine tumors: Secondary | ICD-10-CM

## 2013-10-14 DIAGNOSIS — C3491 Malignant neoplasm of unspecified part of right bronchus or lung: Secondary | ICD-10-CM

## 2013-10-14 MED ORDER — SODIUM CHLORIDE 0.9 % IV SOLN
75.0000 mg/m2 | Freq: Once | INTRAVENOUS | Status: AC
  Administered 2013-10-14: 110 mg via INTRAVENOUS
  Filled 2013-10-14: qty 5.5

## 2013-10-14 MED ORDER — PROCHLORPERAZINE MALEATE 10 MG PO TABS
ORAL_TABLET | ORAL | Status: AC
  Filled 2013-10-14: qty 1

## 2013-10-14 MED ORDER — MORPHINE SULFATE ER 30 MG PO TBCR: 30.0000 mg | EXTENDED_RELEASE_TABLET | Freq: Two times a day (BID) | ORAL | Status: DC

## 2013-10-14 MED ORDER — HEPARIN SOD (PORK) LOCK FLUSH 100 UNIT/ML IV SOLN
500.0000 [IU] | Freq: Once | INTRAVENOUS | Status: AC | PRN
  Administered 2013-10-14: 500 [IU]
  Filled 2013-10-14: qty 5

## 2013-10-14 MED ORDER — PROCHLORPERAZINE MALEATE 10 MG PO TABS
10.0000 mg | ORAL_TABLET | Freq: Once | ORAL | Status: AC
  Administered 2013-10-14: 10 mg via ORAL

## 2013-10-14 MED ORDER — SODIUM CHLORIDE 0.9 % IJ SOLN
10.0000 mL | INTRAMUSCULAR | Status: DC | PRN
  Administered 2013-10-14: 10 mL
  Filled 2013-10-14: qty 10

## 2013-10-14 MED ORDER — SODIUM CHLORIDE 0.9 % IV SOLN
Freq: Once | INTRAVENOUS | Status: AC
  Administered 2013-10-14: 12:00:00 via INTRAVENOUS

## 2013-10-14 NOTE — Patient Instructions (Signed)
[facility] Discharge Instructions for Patients Receiving Chemotherapy  Today you received the following chemotherapy agents Etoposide.  To help prevent nausea and vomiting after your treatment, we encourage you to take your nausea medication compazine. zofran as ordered.   If you develop nausea and vomiting that is not controlled by your nausea medication, call the clinic.   BELOW ARE SYMPTOMS THAT SHOULD BE REPORTED IMMEDIATELY:  *FEVER GREATER THAN 100.5 F  *CHILLS WITH OR WITHOUT FEVER  NAUSEA AND VOMITING THAT IS NOT CONTROLLED WITH YOUR NAUSEA MEDICATION  *UNUSUAL SHORTNESS OF BREATH  *UNUSUAL BRUISING OR BLEEDING  TENDERNESS IN MOUTH AND THROAT WITH OR WITHOUT PRESENCE OF ULCERS  *URINARY PROBLEMS  *BOWEL PROBLEMS  UNUSUAL RASH Items with * indicate a potential emergency and should be followed up as soon as possible.  Feel free to call the clinic you have any questions or concerns. The clinic phone number is [phone].

## 2013-10-14 NOTE — Progress Notes (Signed)
Gave pt and wife prescription order for a 4 wheel rolling walker to help with pt's mobility to take to [facility].

## 2013-10-14 NOTE — Progress Notes (Signed)
[facility] arrived with walker for patient.  Unable to use.  Patient needs order for a rolling or four wheel walker with seat to facilitate portable oxygen use.    Prescription given to patient by collaborative nurse for 4-wheel rolling walker to take to [facility].

## 2013-10-14 NOTE — Progress Notes (Signed)
Discharged at 1330 ambulatory with cane and with family.  No distress noted.

## 2013-10-14 NOTE — Telephone Encounter (Signed)
Received message from daughter Clover Mealy requesting refill of MS Contin 30 mg.  Spoke with [name] and was informed that [facility] only had 45 tablets of MS Contin to fill for pt - script was wriiten for 60 tablets on 09/28/13. Spoke with Ander Purpura, pharmacist at [facility] and confirmed that pt did receive only 45 tablets of MS Contin.   Dr. Juliann Mule notified.  Prescription will be left with Saturday nurses for pt to pick up when he comes for IVF and Neulast injection.

## 2013-10-15 ENCOUNTER — Encounter: Payer: Self-pay | Admitting: *Deleted

## 2013-10-15 ENCOUNTER — Ambulatory Visit: Payer: BC Managed Care – PPO

## 2013-10-15 ENCOUNTER — Ambulatory Visit (HOSPITAL_BASED_OUTPATIENT_CLINIC_OR_DEPARTMENT_OTHER): Payer: BC Managed Care – PPO

## 2013-10-15 VITALS — BP 106/74 | HR 68 | Temp 97.8°F | Resp 18

## 2013-10-15 DIAGNOSIS — C7A1 Malignant poorly differentiated neuroendocrine tumors: Secondary | ICD-10-CM

## 2013-10-15 DIAGNOSIS — Z5189 Encounter for other specified aftercare: Secondary | ICD-10-CM

## 2013-10-15 DIAGNOSIS — C7B8 Other secondary neuroendocrine tumors: Secondary | ICD-10-CM

## 2013-10-15 DIAGNOSIS — R630 Anorexia: Secondary | ICD-10-CM

## 2013-10-15 DIAGNOSIS — C3491 Malignant neoplasm of unspecified part of right bronchus or lung: Secondary | ICD-10-CM

## 2013-10-15 MED ORDER — HEPARIN SOD (PORK) LOCK FLUSH 100 UNIT/ML IV SOLN
500.0000 [IU] | Freq: Once | INTRAVENOUS | Status: AC
  Administered 2013-10-15: 500 [IU] via INTRAVENOUS
  Filled 2013-10-15: qty 5

## 2013-10-15 MED ORDER — SODIUM CHLORIDE 0.9 % IV SOLN
Freq: Once | INTRAVENOUS | Status: AC
  Administered 2013-10-15: 09:00:00 via INTRAVENOUS

## 2013-10-15 MED ORDER — PEGFILGRASTIM INJECTION 6 MG/0.6ML
6.0000 mg | Freq: Once | SUBCUTANEOUS | Status: AC
  Administered 2013-10-15: 6 mg via SUBCUTANEOUS

## 2013-10-15 MED ORDER — SODIUM CHLORIDE 0.9 % IJ SOLN
10.0000 mL | INTRAMUSCULAR | Status: DC | PRN
  Administered 2013-10-15: 10 mL via INTRAVENOUS
  Filled 2013-10-15: qty 10

## 2013-10-15 NOTE — Patient Instructions (Addendum)
Dehydration, Adult Dehydration is when you lose more fluids from the body than you take in. Vital organs like the kidneys, brain, and heart cannot function without a proper amount of fluids and salt. Any loss of fluids from the body can cause dehydration.  CAUSES   Vomiting.  Diarrhea.  Excessive sweating.  Excessive urine output.  Fever. SYMPTOMS  Mild dehydration  Thirst.  Dry lips.  Slightly dry mouth. Moderate dehydration  Very dry mouth.  Sunken eyes.  Skin does not bounce back quickly when lightly pinched and released.  Dark urine and decreased urine production.  Decreased tear production.  Headache. Severe dehydration  Very dry mouth.  Extreme thirst.  Rapid, weak pulse (more than 100 beats per minute at rest).  Cold hands and feet.  Not able to sweat in spite of heat and temperature.  Rapid breathing.  Blue lips.  Confusion and lethargy.  Difficulty being awakened.  Minimal urine production.  No tears. DIAGNOSIS  Your caregiver will diagnose dehydration based on your symptoms and your exam. Blood and urine tests will help confirm the diagnosis. The diagnostic evaluation should also identify the cause of dehydration. TREATMENT  Treatment of mild or moderate dehydration can often be done at home by increasing the amount of fluids that you drink. It is best to drink small amounts of fluid more often. Drinking too much at one time can make vomiting worse. Refer to the home care instructions below. Severe dehydration needs to be treated at the hospital where you will probably be given intravenous (IV) fluids that contain water and electrolytes. HOME CARE INSTRUCTIONS   Ask your caregiver about specific rehydration instructions.  Drink enough fluids to keep your urine clear or pale yellow.  Drink small amounts frequently if you have nausea and vomiting.  Eat as you normally do.  Avoid:  Foods or drinks high in sugar.  Carbonated  drinks.  Juice.  Extremely hot or cold fluids.  Drinks with caffeine.  Fatty, greasy foods.  Alcohol.  Tobacco.  Overeating.  Gelatin desserts.  Wash your hands well to avoid spreading bacteria and viruses.  Only take over-the-counter or prescription medicines for pain, discomfort, or fever as directed by your caregiver.  Ask your caregiver if you should continue all prescribed and over-the-counter medicines.  Keep all follow-up appointments with your caregiver. SEEK MEDICAL CARE IF:  You have abdominal pain and it increases or stays in one area (localizes).  You have a rash, stiff neck, or severe headache.  You are irritable, sleepy, or difficult to awaken.  You are weak, dizzy, or extremely thirsty. SEEK IMMEDIATE MEDICAL CARE IF:   You are unable to keep fluids down or you get worse despite treatment.  You have frequent episodes of vomiting or diarrhea.  You have blood or green matter (bile) in your vomit.  You have blood in your stool or your stool looks black and tarry.  You have not urinated in 6 to 8 hours, or you have only urinated a small amount of very dark urine.  You have a fever.  You faint. MAKE SURE YOU:   Understand these instructions.  Will watch your condition.  Will get help right away if you are not doing well or get worse. Document Released: 02/10/2005 Document Revised: 05/05/2011 Document Reviewed: 09/30/2010 Exit[employer] Patient Information 2015 [employer]. This information is not intended to replace advice given to you by your health care provider. Make sure you discuss any questions you have with your health care   provider. Pegfilgrastim injection What is this medicine? PEGFILGRASTIM (peg fil GRA stim) is a long-acting granulocyte colony-stimulating factor that stimulates the growth of neutrophils, a type of white blood cell important in the body's fight against infection. It is used to reduce the incidence of fever and infection in  patients with certain types of cancer who are receiving chemotherapy that affects the bone marrow. This medicine may be used for other purposes; ask your health care provider or pharmacist if you have questions. COMMON BRAND NAME(S): Neulasta What should I tell my health care provider before I take this medicine? They need to know if you have any of these conditions: -latex allergy -ongoing radiation therapy -sickle cell disease -skin reactions to acrylic adhesives (On-Body Injector only) -an unusual or allergic reaction to pegfilgrastim, filgrastim, other medicines, foods, dyes, or preservatives -pregnant or trying to get pregnant -breast-feeding How should I use this medicine? This medicine is for injection under the skin. If you get this medicine at home, you will be taught how to prepare and give the pre-filled syringe or how to use the On-body Injector. Refer to the patient Instructions for Use for detailed instructions. Use exactly as directed. Take your medicine at regular intervals. Do not take your medicine more often than directed. It is important that you put your used needles and syringes in a special sharps container. Do not put them in a trash can. If you do not have a sharps container, call your pharmacist or healthcare provider to get one. Talk to your pediatrician regarding the use of this medicine in children. Special care may be needed. Overdosage: If you think you have taken too much of this medicine contact a poison control center or emergency room at once. NOTE: This medicine is only for you. Do not share this medicine with others. What if I miss a dose? It is important not to miss your dose. Call your doctor or health care professional if you miss your dose. If you miss a dose due to an On-body Injector failure or leakage, a new dose should be administered as soon as possible using a single prefilled syringe for manual use. What may interact with this  medicine? Interactions have not been studied. Give your health care provider a list of all the medicines, herbs, non-prescription drugs, or dietary supplements you use. Also tell them if you smoke, drink alcohol, or use illegal drugs. Some items may interact with your medicine. This list may not describe all possible interactions. Give your health care provider a list of all the medicines, herbs, non-prescription drugs, or dietary supplements you use. Also tell them if you smoke, drink alcohol, or use illegal drugs. Some items may interact with your medicine. What should I watch for while using this medicine? You may need blood work done while you are taking this medicine. If you are going to need a MRI, CT scan, or other procedure, tell your doctor that you are using this medicine (On-Body Injector only). What side effects may I notice from receiving this medicine? Side effects that you should report to your doctor or health care professional as soon as possible: -allergic reactions like skin rash, itching or hives, swelling of the face, lips, or tongue -dizziness -fever -pain, redness, or irritation at site where injected -pinpoint red spots on the skin -shortness of breath or breathing problems -stomach or side pain, or pain at the shoulder -swelling -tiredness -trouble passing urine Side effects that usually do not require medical attention (report to  your doctor or health care professional if they continue or are bothersome): -bone pain -muscle pain This list may not describe all possible side effects. Call your doctor for medical advice about side effects. You may report side effects to FDA at 1-800-FDA-1088. Where should I keep my medicine? Keep out of the reach of children. Store pre-filled syringes in a refrigerator between 2 and 8 degrees C (36 and 46 degrees F). Do not freeze. Keep in carton to protect from light. Throw away this medicine if it is left out of the refrigerator for  more than 48 hours. Throw away any unused medicine after the expiration date. NOTE: This sheet is a summary. It may not cover all possible information. If you have questions about this medicine, talk to your doctor, pharmacist, or health care provider.  2015, [employer]. (2013-05-12 16:14:05)

## 2013-10-18 ENCOUNTER — Telehealth: Payer: Self-pay | Admitting: *Deleted

## 2013-10-18 ENCOUNTER — Telehealth: Payer: Self-pay | Admitting: Medical Oncology

## 2013-10-18 ENCOUNTER — Other Ambulatory Visit: Payer: Self-pay | Admitting: Medical Oncology

## 2013-10-18 DIAGNOSIS — C3490 Malignant neoplasm of unspecified part of unspecified bronchus or lung: Secondary | ICD-10-CM

## 2013-10-18 NOTE — Telephone Encounter (Signed)
Per desk RN I have scheduled appts. Desk RN to place POF in computer and call the patient

## 2013-10-18 NOTE — Telephone Encounter (Signed)
Pt's daughter [name] called asking if her dad could get fluids this week. Per Dr. Juliann Mule this fine. POF made. I called Bonnie-wife with 730 appointment time for the am.She voiced understanding. They will pick up a new calender tomorrow.

## 2013-10-19 ENCOUNTER — Ambulatory Visit (HOSPITAL_BASED_OUTPATIENT_CLINIC_OR_DEPARTMENT_OTHER): Payer: BC Managed Care – PPO

## 2013-10-19 VITALS — BP 114/63 | HR 67 | Temp 97.8°F | Resp 16

## 2013-10-19 DIAGNOSIS — Z5189 Encounter for other specified aftercare: Secondary | ICD-10-CM

## 2013-10-19 DIAGNOSIS — C7A1 Malignant poorly differentiated neuroendocrine tumors: Secondary | ICD-10-CM

## 2013-10-19 DIAGNOSIS — C3490 Malignant neoplasm of unspecified part of unspecified bronchus or lung: Secondary | ICD-10-CM

## 2013-10-19 MED ORDER — HEPARIN SOD (PORK) LOCK FLUSH 100 UNIT/ML IV SOLN
500.0000 [IU] | Freq: Once | INTRAVENOUS | Status: AC
  Administered 2013-10-19: 500 [IU] via INTRAVENOUS
  Filled 2013-10-19: qty 5

## 2013-10-19 MED ORDER — SODIUM CHLORIDE 0.9 % IV SOLN
Freq: Once | INTRAVENOUS | Status: AC
  Administered 2013-10-19: 08:00:00 via INTRAVENOUS

## 2013-10-19 MED ORDER — SODIUM CHLORIDE 0.9 % IJ SOLN
10.0000 mL | INTRAMUSCULAR | Status: AC | PRN
  Administered 2013-10-19: 10 mL
  Filled 2013-10-19: qty 10

## 2013-10-19 MED ORDER — HEPARIN SOD (PORK) LOCK FLUSH 100 UNIT/ML IV SOLN
250.0000 [IU] | INTRAVENOUS | Status: DC | PRN
  Filled 2013-10-19: qty 5

## 2013-10-19 NOTE — Patient Instructions (Signed)
Dehydration, Adult Dehydration is when you lose more fluids from the body than you take in. Vital organs like the kidneys, brain, and heart cannot function without a proper amount of fluids and salt. Any loss of fluids from the body can cause dehydration.  CAUSES   Vomiting.  Diarrhea.  Excessive sweating.  Excessive urine output.  Fever. SYMPTOMS  Mild dehydration  Thirst.  Dry lips.  Slightly dry mouth. Moderate dehydration  Very dry mouth.  Sunken eyes.  Skin does not bounce back quickly when lightly pinched and released.  Dark urine and decreased urine production.  Decreased tear production.  Headache. Severe dehydration  Very dry mouth.  Extreme thirst.  Rapid, weak pulse (more than 100 beats per minute at rest).  Cold hands and feet.  Not able to sweat in spite of heat and temperature.  Rapid breathing.  Blue lips.  Confusion and lethargy.  Difficulty being awakened.  Minimal urine production.  No tears. DIAGNOSIS  Your caregiver will diagnose dehydration based on your symptoms and your exam. Blood and urine tests will help confirm the diagnosis. The diagnostic evaluation should also identify the cause of dehydration. TREATMENT  Treatment of mild or moderate dehydration can often be done at home by increasing the amount of fluids that you drink. It is best to drink small amounts of fluid more often. Drinking too much at one time can make vomiting worse. Refer to the home care instructions below. Severe dehydration needs to be treated at the hospital where you will probably be given intravenous (IV) fluids that contain water and electrolytes. HOME CARE INSTRUCTIONS   Ask your caregiver about specific rehydration instructions.  Drink enough fluids to keep your urine clear or pale yellow.  Drink small amounts frequently if you have nausea and vomiting.  Eat as you normally do.  Avoid:  Foods or drinks high in sugar.  Carbonated  drinks.  Juice.  Extremely hot or cold fluids.  Drinks with caffeine.  Fatty, greasy foods.  Alcohol.  Tobacco.  Overeating.  Gelatin desserts.  Wash your hands well to avoid spreading bacteria and viruses.  Only take over-the-counter or prescription medicines for pain, discomfort, or fever as directed by your caregiver.  Ask your caregiver if you should continue all prescribed and over-the-counter medicines.  Keep all follow-up appointments with your caregiver. SEEK MEDICAL CARE IF:  You have abdominal pain and it increases or stays in one area (localizes).  You have a rash, stiff neck, or severe headache.  You are irritable, sleepy, or difficult to awaken.  You are weak, dizzy, or extremely thirsty. SEEK IMMEDIATE MEDICAL CARE IF:   You are unable to keep fluids down or you get worse despite treatment.  You have frequent episodes of vomiting or diarrhea.  You have blood or green matter (bile) in your vomit.  You have blood in your stool or your stool looks black and tarry.  You have not urinated in 6 to 8 hours, or you have only urinated a small amount of very dark urine.  You have a fever.  You faint. MAKE SURE YOU:   Understand these instructions.  Will watch your condition.  Will get help right away if you are not doing well or get worse. Document Released: 02/10/2005 Document Revised: 05/05/2011 Document Reviewed: 09/30/2010 Exit[employer] Patient Information 2015 [employer]. This information is not intended to replace advice given to you by your health care provider. Make sure you discuss any questions you have with your health care   provider.  

## 2013-10-21 ENCOUNTER — Telehealth: Payer: Self-pay | Admitting: Medical Oncology

## 2013-10-21 ENCOUNTER — Other Ambulatory Visit: Payer: Self-pay | Admitting: Internal Medicine

## 2013-10-21 NOTE — Telephone Encounter (Signed)
Pt's daughter [name] and grandson Loreli Slot called stating that pt still has a lot of congestion. He is taking the mucinex DM. He is not having any trouble breathing but just rattling in his chest. He does not have a fever but is coughing up some secretions. Sometimes it is clear and some times has a greenish color. I spoke with Dr. Juliann Mule and he states he gave pt an antibiotic last week so he does not want to prescribe anything right now. If he gets a fever to call and continue the mucinex.He will be here in the morning to get some fluids.I discussed the above with [facility] and he voiced understanding.

## 2013-10-22 ENCOUNTER — Ambulatory Visit (HOSPITAL_BASED_OUTPATIENT_CLINIC_OR_DEPARTMENT_OTHER): Payer: BC Managed Care – PPO

## 2013-10-22 VITALS — BP 119/69 | HR 67 | Temp 97.7°F | Resp 18

## 2013-10-22 DIAGNOSIS — C3490 Malignant neoplasm of unspecified part of unspecified bronchus or lung: Secondary | ICD-10-CM

## 2013-10-22 DIAGNOSIS — E860 Dehydration: Secondary | ICD-10-CM

## 2013-10-22 DIAGNOSIS — C7A1 Malignant poorly differentiated neuroendocrine tumors: Secondary | ICD-10-CM

## 2013-10-22 MED ORDER — SODIUM CHLORIDE 0.9 % IJ SOLN
10.0000 mL | INTRAMUSCULAR | Status: DC | PRN
  Administered 2013-10-22: 10 mL via INTRAVENOUS
  Filled 2013-10-22: qty 10

## 2013-10-22 MED ORDER — HEPARIN SOD (PORK) LOCK FLUSH 100 UNIT/ML IV SOLN
500.0000 [IU] | Freq: Once | INTRAVENOUS | Status: AC
  Administered 2013-10-22: 500 [IU] via INTRAVENOUS
  Filled 2013-10-22: qty 5

## 2013-10-22 MED ORDER — SODIUM CHLORIDE 0.9 % IV SOLN
Freq: Once | INTRAVENOUS | Status: AC
  Administered 2013-10-22: 08:00:00 via INTRAVENOUS

## 2013-10-22 NOTE — Patient Instructions (Signed)

## 2013-10-24 ENCOUNTER — Other Ambulatory Visit: Payer: Self-pay | Admitting: Hematology

## 2013-10-24 ENCOUNTER — Telehealth: Payer: Self-pay | Admitting: *Deleted

## 2013-10-24 NOTE — Telephone Encounter (Signed)
Per desk RN I have scheduled IVF on 9/3. Desk RN to call the patient

## 2013-10-24 NOTE — Telephone Encounter (Signed)
Received call from pt's daughter this am stating that pt is for a CT today @ 12:30 & would like to get some IVF while here today.  She states he had his last chemo recently & not eating or drinking a lot & his weight is down to 90 lbs.  Informed that I would speak to MD. She states that pt can be reached at his home.  Called pt & CT is scheduled for thurs & it is thurs that they would like to get fluids.  Scheduled with Michelle/Infusion for thurs @ 1:30pm & informed pt's wife.

## 2013-10-25 ENCOUNTER — Telehealth: Payer: Self-pay

## 2013-10-25 NOTE — Telephone Encounter (Signed)
Patient's daughter left a voice mail message regarding mental status changes observed by daughter and his spouse and wanted to know whom she should call.Returned message to call medical oncology as patient is under going chemotherapy treatment now and under their care.[name] knows to call me if she needs further direction.

## 2013-10-27 ENCOUNTER — Encounter (HOSPITAL_COMMUNITY): Payer: Self-pay

## 2013-10-27 ENCOUNTER — Ambulatory Visit: Payer: BC Managed Care – PPO

## 2013-10-27 ENCOUNTER — Ambulatory Visit (HOSPITAL_COMMUNITY)
Admission: RE | Admit: 2013-10-27 | Discharge: 2013-10-27 | Disposition: A | Discharge status: Home / Self Care | Payer: BC Managed Care – PPO | Source: Ambulatory Visit | Attending: Internal Medicine | Admitting: Internal Medicine

## 2013-10-27 DIAGNOSIS — J438 Other emphysema: Secondary | ICD-10-CM | POA: Insufficient documentation

## 2013-10-27 DIAGNOSIS — R0602 Shortness of breath: Secondary | ICD-10-CM | POA: Diagnosis present

## 2013-10-27 DIAGNOSIS — C3490 Malignant neoplasm of unspecified part of unspecified bronchus or lung: Secondary | ICD-10-CM | POA: Insufficient documentation

## 2013-10-27 DIAGNOSIS — R634 Abnormal weight loss: Secondary | ICD-10-CM | POA: Insufficient documentation

## 2013-10-27 DIAGNOSIS — K5900 Constipation, unspecified: Secondary | ICD-10-CM | POA: Diagnosis not present

## 2013-10-27 MED ORDER — IOHEXOL 300 MG/ML  SOLN
80.0000 mL | Freq: Once | INTRAMUSCULAR | Status: AC | PRN
  Administered 2013-10-27: 80 mL via INTRAVENOUS

## 2013-10-27 MED ORDER — IOHEXOL 300 MG/ML  SOLN
50.0000 mL | Freq: Once | INTRAMUSCULAR | Status: AC | PRN
  Administered 2013-10-27: 50 mL via ORAL

## 2013-10-27 NOTE — Patient Instructions (Signed)
Dehydration, Adult Dehydration is when you lose more fluids from the body than you take in. Vital organs like the kidneys, brain, and heart cannot function without a proper amount of fluids and salt. Any loss of fluids from the body can cause dehydration.  CAUSES   Vomiting.  Diarrhea.  Excessive sweating.  Excessive urine output.  Fever. SYMPTOMS  Mild dehydration  Thirst.  Dry lips.  Slightly dry mouth. Moderate dehydration  Very dry mouth.  Sunken eyes.  Skin does not bounce back quickly when lightly pinched and released.  Dark urine and decreased urine production.  Decreased tear production.  Headache. Severe dehydration  Very dry mouth.  Extreme thirst.  Rapid, weak pulse (more than 100 beats per minute at rest).  Cold hands and feet.  Not able to sweat in spite of heat and temperature.  Rapid breathing.  Blue lips.  Confusion and lethargy.  Difficulty being awakened.  Minimal urine production.  No tears. DIAGNOSIS  Your caregiver will diagnose dehydration based on your symptoms and your exam. Blood and urine tests will help confirm the diagnosis. The diagnostic evaluation should also identify the cause of dehydration. TREATMENT  Treatment of mild or moderate dehydration can often be done at home by increasing the amount of fluids that you drink. It is best to drink small amounts of fluid more often. Drinking too much at one time can make vomiting worse. Refer to the home care instructions below. Severe dehydration needs to be treated at the hospital where you will probably be given intravenous (IV) fluids that contain water and electrolytes. HOME CARE INSTRUCTIONS   Ask your caregiver about specific rehydration instructions.  Drink enough fluids to keep your urine clear or pale yellow.  Drink small amounts frequently if you have nausea and vomiting.  Eat as you normally do.  Avoid:  Foods or drinks high in sugar.  Carbonated  drinks.  Juice.  Extremely hot or cold fluids.  Drinks with caffeine.  Fatty, greasy foods.  Alcohol.  Tobacco.  Overeating.  Gelatin desserts.  Wash your hands well to avoid spreading bacteria and viruses.  Only take over-the-counter or prescription medicines for pain, discomfort, or fever as directed by your caregiver.  Ask your caregiver if you should continue all prescribed and over-the-counter medicines.  Keep all follow-up appointments with your caregiver. SEEK MEDICAL CARE IF:  You have abdominal pain and it increases or stays in one area (localizes).  You have a rash, stiff neck, or severe headache.  You are irritable, sleepy, or difficult to awaken.  You are weak, dizzy, or extremely thirsty. SEEK IMMEDIATE MEDICAL CARE IF:   You are unable to keep fluids down or you get worse despite treatment.  You have frequent episodes of vomiting or diarrhea.  You have blood or green matter (bile) in your vomit.  You have blood in your stool or your stool looks black and tarry.  You have not urinated in 6 to 8 hours, or you have only urinated a small amount of very dark urine.  You have a fever.  You faint. MAKE SURE YOU:   Understand these instructions.  Will watch your condition.  Will get help right away if you are not doing well or get worse. Document Released: 02/10/2005 Document Revised: 05/05/2011 Document Reviewed: 09/30/2010 Exit[employer] Patient Information 2015 [employer]. This information is not intended to replace advice given to you by your health care provider. Make sure you discuss any questions you have with your health care   provider.  

## 2013-11-02 ENCOUNTER — Telehealth: Payer: Self-pay

## 2013-11-02 ENCOUNTER — Other Ambulatory Visit: Payer: Self-pay

## 2013-11-02 ENCOUNTER — Other Ambulatory Visit (HOSPITAL_BASED_OUTPATIENT_CLINIC_OR_DEPARTMENT_OTHER): Payer: BC Managed Care – PPO

## 2013-11-02 ENCOUNTER — Telehealth: Payer: Self-pay | Admitting: Hematology

## 2013-11-02 ENCOUNTER — Encounter: Payer: Self-pay | Admitting: Hematology

## 2013-11-02 ENCOUNTER — Ambulatory Visit (HOSPITAL_BASED_OUTPATIENT_CLINIC_OR_DEPARTMENT_OTHER): Payer: BC Managed Care – PPO

## 2013-11-02 ENCOUNTER — Ambulatory Visit (HOSPITAL_BASED_OUTPATIENT_CLINIC_OR_DEPARTMENT_OTHER): Payer: BC Managed Care – PPO | Admitting: Hematology

## 2013-11-02 VITALS — BP 103/84 | HR 105 | Temp 98.5°F | Resp 18 | Ht 67.0 in | Wt 93.5 lb

## 2013-11-02 DIAGNOSIS — C3490 Malignant neoplasm of unspecified part of unspecified bronchus or lung: Secondary | ICD-10-CM

## 2013-11-02 DIAGNOSIS — Z95828 Presence of other vascular implants and grafts: Secondary | ICD-10-CM

## 2013-11-02 DIAGNOSIS — K209 Esophagitis, unspecified without bleeding: Secondary | ICD-10-CM

## 2013-11-02 DIAGNOSIS — T451X5A Adverse effect of antineoplastic and immunosuppressive drugs, initial encounter: Secondary | ICD-10-CM

## 2013-11-02 DIAGNOSIS — D630 Anemia in neoplastic disease: Secondary | ICD-10-CM

## 2013-11-02 DIAGNOSIS — E43 Unspecified severe protein-calorie malnutrition: Secondary | ICD-10-CM

## 2013-11-02 DIAGNOSIS — K2900 Acute gastritis without bleeding: Secondary | ICD-10-CM

## 2013-11-02 DIAGNOSIS — C7931 Secondary malignant neoplasm of brain: Secondary | ICD-10-CM

## 2013-11-02 DIAGNOSIS — E860 Dehydration: Secondary | ICD-10-CM

## 2013-11-02 DIAGNOSIS — R630 Anorexia: Secondary | ICD-10-CM

## 2013-11-02 DIAGNOSIS — Z452 Encounter for adjustment and management of vascular access device: Secondary | ICD-10-CM

## 2013-11-02 DIAGNOSIS — I2699 Other pulmonary embolism without acute cor pulmonale: Secondary | ICD-10-CM

## 2013-11-02 DIAGNOSIS — C7949 Secondary malignant neoplasm of other parts of nervous system: Secondary | ICD-10-CM

## 2013-11-02 DIAGNOSIS — D6481 Anemia due to antineoplastic chemotherapy: Secondary | ICD-10-CM

## 2013-11-02 DIAGNOSIS — J40 Bronchitis, not specified as acute or chronic: Secondary | ICD-10-CM

## 2013-11-02 LAB — CBC WITH DIFFERENTIAL/PLATELET
BASO%: 1.2 % (ref 0.0–2.0)
BASOS ABS: 0.1 10*3/uL (ref 0.0–0.1)
EOS%: 0.8 % (ref 0.0–7.0)
Eosinophils Absolute: 0.1 10*3/uL (ref 0.0–0.5)
HCT: 28 % — ABNORMAL LOW (ref 38.4–49.9)
HEMOGLOBIN: 9.2 g/dL — AB (ref 13.0–17.1)
LYMPH#: 0.8 10*3/uL — AB (ref 0.9–3.3)
LYMPH%: 12.3 % — ABNORMAL LOW (ref 14.0–49.0)
MCH: 34 pg — AB (ref 27.2–33.4)
MCHC: 33 g/dL (ref 32.0–36.0)
MCV: 103.1 fL — ABNORMAL HIGH (ref 79.3–98.0)
MONO#: 0.7 10*3/uL (ref 0.1–0.9)
MONO%: 11 % (ref 0.0–14.0)
NEUT#: 5 10*3/uL (ref 1.5–6.5)
NEUT%: 74.7 % (ref 39.0–75.0)
Platelets: 170 10*3/uL (ref 140–400)
RBC: 2.72 10*6/uL — AB (ref 4.20–5.82)
RDW: 17.1 % — AB (ref 11.0–14.6)
WBC: 6.7 10*3/uL (ref 4.0–10.3)

## 2013-11-02 LAB — COMPREHENSIVE METABOLIC PANEL (CC13)
ALBUMIN: 3.4 g/dL — AB (ref 3.5–5.0)
ALT: 13 U/L (ref 0–55)
AST: 16 U/L (ref 5–34)
Alkaline Phosphatase: 105 U/L (ref 40–150)
Anion Gap: 10 mEq/L (ref 3–11)
BUN: 10.1 mg/dL (ref 7.0–26.0)
CALCIUM: 9.3 mg/dL (ref 8.4–10.4)
CHLORIDE: 103 meq/L (ref 98–109)
CO2: 28 mEq/L (ref 22–29)
Creatinine: 0.9 mg/dL (ref 0.7–1.3)
Glucose: 115 mg/dl (ref 70–140)
Potassium: 3.9 mEq/L (ref 3.5–5.1)
SODIUM: 141 meq/L (ref 136–145)
TOTAL PROTEIN: 6.6 g/dL (ref 6.4–8.3)
Total Bilirubin: 0.3 mg/dL (ref 0.20–1.20)

## 2013-11-02 LAB — LACTATE DEHYDROGENASE (CC13): LDH: 159 U/L (ref 125–245)

## 2013-11-02 MED ORDER — HEPARIN SOD (PORK) LOCK FLUSH 100 UNIT/ML IV SOLN
500.0000 [IU] | Freq: Once | INTRAVENOUS | Status: AC
  Administered 2013-11-02: 500 [IU] via INTRAVENOUS
  Filled 2013-11-02: qty 5

## 2013-11-02 MED ORDER — SODIUM CHLORIDE 0.9 % IJ SOLN
10.0000 mL | INTRAMUSCULAR | Status: DC | PRN
  Administered 2013-11-02: 10 mL via INTRAVENOUS
  Filled 2013-11-02: qty 10

## 2013-11-02 MED ORDER — LORAZEPAM 0.5 MG PO TABS: 0.5000 mg | ORAL_TABLET | Freq: Two times a day (BID) | ORAL | Status: DC | PRN

## 2013-11-02 MED ORDER — LEVOFLOXACIN 500 MG PO TABS: 500.0000 mg | ORAL_TABLET | Freq: Every day | ORAL | Status: DC

## 2013-11-02 NOTE — Telephone Encounter (Signed)
Mrs Loftin called saying congestion medication was not at pharmacy. Dr Lona Kettle e-scribed levaquin to [facility] on [address]. Called wife back and she requested rx sent to [facility] in [place]. rx called to [facility] and [facility] called to cancel.

## 2013-11-02 NOTE — Patient Instructions (Signed)

## 2013-11-02 NOTE — Telephone Encounter (Signed)
, °

## 2013-11-02 NOTE — Progress Notes (Signed)
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 Here to discuss restaging studies done on 10/27/2013.  Chief Complaint  Patient presents with  . Follow-up    CURRENT CHEMOTHERAPY TREATMENT:    1. He completed First cycle of cisplatin (79 mg/m2 on day #1) plus etoposide (100 mg/m2 on 05/18/13, 05/19/13 and 05/20/13.   2. The change in drugs from Cisplatin to Carboplatin. Cycle # 1 of Carboplatin (on day#1 of 260 mg)  plus etoposide (75 mg/m2 daily on 06/22/13, 06/23/13 and 06/24/13) started on 06/22/13.  He completed neulasta on 06/25/13.  3. Cycle #2 of Carboplatin (on day#1 of 260 mg)  plus etoposide (75 mg/m2 daily on 07/13/13, 07/14/13 and 07/15/13).  He completed neulasta on 07/16/13. 4.  He completed cycle 3 of carboplatin (on day 1 of 260 mg) plus etoposide (75 mg/m2 daily on 08/10/13, 08/11/13, and 08/12/13) and receipt of neulasta on 08/13/13.  5. Cycle 4 Carboplatin and Etoposide given on 09/07/13, 09/08/13, and 09/09/13 and Neulasta given on 09/10/13. 6. Cycle 5 of Carboplatin plus etoposide given on 10/12/13, 10/13/13, 10/14/13. Neulasta given on 10/15/13  NCCN guidelines support 4-6 cycles of induction therapy followed by observation.    PRIOR RADIATION TREATMENT:  WB XRT given on 05/03/13 - 05/18/13 per Dr. Pablo Ledger.   INTERVAL HISTORY:  Ricky Villanueva 63 y.o. male with a history of small cell lung cancer is here for follow up. He was last seen by Dr Juliann Mule on 10/12/2013. His past medical history also includes HTN/Hyperlipidemia and longstanding smoking history but quit 12/25/2011 presented to ER on 04/21/13 with worsening dyspnea, cough with streaks of blood over the past one week. In the ED, his oxygen saturation was 91% on 2 L nasal canula. CXR revealed a progressive increased density in the right lower lobe consistent with worsening atelectasis or pneumonia  and a soft tissue fullness in the hilar region suspicious for lymphadenopathy or mass.and follow-up CT of chest was recommended. It revealed a nonocclusive left upper lobe, right lower lobe pulmonary emboli with right heart strain. Bulky mediastinal and right hilar lymphadenopathy, encasing the of pulmonary arteries, pulmonary veins and bronchi, completely effacing right lower lobe segmental bronchi. Extensive consolidation in the right lung suggested postobstructive pneumonia, with interstitial prominence which may reflect superimposed lymphangitic spread of infection or neoplasm. He was admitted and started on antibiotics and heparin. Pulmonary was consulted and he had a video bronchoscopy on 03/03 with consistent with small cell ca of lung. 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 He had WBXRT as noted above. He then received his first cycle of chemotherapy on 03/25 thru 03/27 and was admitted 04/01 and discharged on 05/31/13 due to dehydration, acute renal failure, mucositis and subsequently had febrile neutropenia. We restarted the Xarelto which held due to profound thrombocytopenia.  Today, he is accompanied by his wife, daughter and his son. He reports feeling fatigued and lightheaded when standing. He reports improved appetite on remeron.  He is still having some cough and sputum and azithromycin does not work for him but Levaquin did help him last time given by Dr Juliann Mule.  Overall doing well. No headaches or vison changes. No SOB or hemoptysis. Uses oxygen on prn basis. He is on MS Contin for  pain bid and oxycodone for breakthrough pain. Pain control is adequate. He uses Boost for appetite. Have some gum sensitivity and I told him to use Vit C 500 mg once a day. Also will start taking Iron once a day for anemia. I reviewed his last scan and showed imaging studies to family  members. Because of his poor IV access, a port was placed in right internal jugular vein by IR.   CT scan Chest 08/03/2013 showed a positive response to therapy with REGRESSION of the previously noted right hilar mass, and decreased size of mediastinal lymphadenopathy.      MRI BRAIN 08/30/2013 Complete resolution of the tiny areas of enhancement in the right  parietal lobe and right paramedian brainstem. No mass effect or  significant edema. Mild increase in small vessel disease versus post  treatment effect.      CAT scan Chest, abdomen and pelvis done on 10/27/2013 was reviewed.   COMPARISON: 08/01/2013 FINDINGS: CT CHEST FINDINGS Mediastinum/Hilar Regions: Persistent subcarinal soft tissue density measuring 11 mm on image 32 remains stable. No other pathologically enlarged lymph nodes identified in mediastinum or hilar regions. Previously noted mild right hilar lymphadenopathy is no longer seen. Other Thoracic Lymphadenopathy: None. Lungs: Moderate emphysema again demonstrated. Pulmonary nodule in the posterior right lower lobe shows mild decrease in size,currently measuring 10 x 17 mm on image 47 compared to 13 x 19 mm previously. No new or enlarging pulmonary nodules or masses are identified. New patchy airspace opacity is seen in the posterior right lower lobe, likely inflammatory or infectious in etiology. Reticulonodular opacities in the peripheral left lower lobe remains stable. Pleura: No evidence of effusion or mass. Vascular/Cardiac: No thoracic aortic aneurysm or other significant abnormality identified. Musculoskeletal: No suspicious bone lesions identified. Other: None. CT ABDOMEN AND PELVIS FINDINGS Liver: Probable tiny sub-cm hepatic cysts remain stable. No definite liver masses are identified. Gallbladder/Biliary: Unremarkable. Mild dilatation of common bile duct remain stable. Pancreas: No mass, inflammatory changes, or other parenchymal abnormality identified. Spleen: Within normal  limits in size and appearance. Adrenal Glands: No mass identified. Kidneys/Urinary Tract: No masses identified. No evidence of hydronephrosis. Tiny left renal cysts remain stable. Lymph Nodes: No pathologically enlarged lymph nodes identified. Pelvic/Reproductive Organs: No mass or other significant abnormality identified. Bowel/Peritoneum: Large stool burden again demonstrated. No evidence of bowel obstruction or wall thickening. No inflammatory process or abnormal fluid collections identified. Vascular: No evidence of abdominal aortic aneurysm. Musculoskeletal: No suspicious bone lesions identified. Other: None.  IMPRESSION: Decreased size of right lower lobe pulmonary nodule and right hilar lymphadenopathy. Stable mild subcarinal lymphadenopathy. New patchy airspace disease in the posterior right lower lobe, likely inflammatory or infectious in etiology. Continued followup by CT recommended. No evidence of abdominal or pelvic metastatic disease. Large stool burden noted; suggest clinical correlation for possible constipation. Electronically Signed By: Earle Gell M.D. On: 10/27/2013 13:14  He does need a script for Lorazepam and requesting that if we can set up the IV fluids at home through home health services which I think is reasonable. He is still anemic, hypotensive BP 103/84 and mildly tachycardic at heart rate 105.   MEDICAL HISTORY: Past Medical History  Diagnosis Date  . Hyperlipidemia   . Hypertension   . GERD (gastroesophageal reflux disease)   . Respiratory failure with hypoxia 04/21/2013    secondary to pneumonia/notes 04/21/2013  . Pulmonary embolism     "got one in there now" (04/21/2013)  . COPD (chronic obstructive pulmonary disease)   .  Pneumonia 2/?01/2014    "wouldn't get better; hospitalized 04/21/2013)  . Arthritis     "minor in my right hand" (04/21/2013)  . Cancer     lung ca dx'd 03/2013  . History of radiation therapy 05/03/13-05/18/13    chest & whole brain    INTERIM  HISTORY: has GERD (gastroesophageal reflux disease); HCAP (healthcare-associated pneumonia); Respiratory failure with hypoxia; HTN (hypertension); Acute pulmonary embolism; Lung mass; Emphysema lung; Small cell carcinoma of lung; Acute renal failure; Protein-calorie malnutrition, severe; and Anorexia on his problem list.    ALLERGIES:  is allergic to decadron and augmentin.  MEDICATIONS: has a current medication list which includes the following prescription(s): albuterol, docusate sodium, levofloxacin, lidocaine-prilocaine, mirtazapine, morphine, nystatin, omeprazole-sodium bicarbonate, ondansetron, oxycodone, polyethylene glycol powder, prochlorperazine, rivaroxaban, tiotropium, levofloxacin, and lorazepam, and the following Facility-Administered Medications: sodium chloride and sodium chloride.  SURGICAL HISTORY:  Past Surgical History  Procedure Laterality Date  . Inguinal hernia repair Right ~ 2005  . Finger surgery Left ~ 1989    "crushed end of my middle finger off"   . Video bronchoscopy Bilateral 04/26/2013    Procedure: VIDEO BRONCHOSCOPY WITH FLUORO;  Surgeon: Wilhelmina Mcardle, MD;  Location: [facility] ENDOSCOPY;  Service: Cardiopulmonary;  Laterality: Bilateral;    REVIEW OF SYSTEMS:   Constitutional: Denies fevers, chills or abnormal weight loss,+fatigue Eyes: Denies blurriness of vision Ears, nose, mouth, throat, and face: Denies mucositis or sore throat Respiratory: + cough, +dyspnea no wheezes Cardiovascular: Denies palpitation, chest discomfort or lower extremity swelling Gastrointestinal:  Denies nausea, heartburn or change in bowel habits Skin: Denies abnormal skin rashes Lymphatics: Denies new lymphadenopathy or easy bruising Neurological:Denies numbness, tingling or new weaknesses Behavioral/Psych: Mood is stable, no new changes  All other systems were reviewed with the patient and are negative.  PHYSICAL EXAMINATION: ECOG PERFORMANCE STATUS: 1-2 KPS 80  Blood pressure  103/84, pulse 105, temperature 98.5 F (36.9 C), temperature source Oral, resp. rate 18, height 5\' 7"  (1.702 m), weight 93 lb 8 oz (42.411 kg).  GENERAL:alert, no distress and comfortable; HOH; chronically ill appearing, alopecia.  SKIN: skin color, texture, turgor are normal, no rashes or significant lesions EYES: normal, Conjunctiva are pink and non-injected, sclera clear OROPHARYNX:no exudate, no erythema and lips, buccal mucosa, and tongue normal  NECK: supple, thyroid normal size, non-tender, without nodularity LYMPH:  no palpable lymphadenopathy in the cervical, axillary or supraclavicular LUNGS: clear to auscultation with normal breathing effort, no wheezes or rhonchi HEART: regular rate & rhythm and no murmurs and no lower extremity edema ABDOMEN:abdomen soft, non-tender and normal bowel sounds Musculoskeletal:no cyanosis of digits and no clubbing  NEURO: alert & oriented x 3 with fluent speech, no focal motor/sensory deficits; Cranial nerves II - XII intact.  Negative rhomberge.  Strength 5/5 in upper and lower extremities bilaterally.  Gait, normal.               ASSESSMENT: Ricky Villanueva 63 y.o. male with a history of SCLC right lung with evidence of brain metastasis making it an Extensive stage which is not curable and treatment goal is "Palliative".   PLAN:  1. 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 Pablo Ledger completed whole brain XRT on 03/25. We started chemotherapy on 03/25 and he completed Cycle #1 On cisplatin 80 mg/m2 on day 1, etoposide 100 mg/m2 days 1,2,3. He had toxicity related to diminished hearing and hospitalization.  He has been evaluated by Dr. Constance Holster who also recommends  hearing aides.    --Given his complains of hearing fullness and intermittent tinnitus, we discontinued cisplatin and substituted it with carboplatin AUC 3. In addition, we will decrease etoposide dose by 25% due to profound neutropenia. We  will also continue neulasta of Day#4 of his chemotherapy each cycle due to febrile neutropenia. He received chemotherapy on 7/15 for his Cycle #4, day #1 of Carboplatin plus etoposide.  He completed last cycle  As listed above on 10/14/13. NCCN guidelines support 4-6 cycles of induction therapy followed by observation.   --The scan done for restaging is showing ongoing improvement with small tumor burden left. It would be very reasonable to give him a break from chemotherapy. We will repeat another scan in 3 months. I will see him back in 6 weeks. I explained to the patient and his family that the nature of this cancer is such that it usually relapses or recurs. We can hope for the best. He is getting another MRI of the brain on 12/05/2013 and will have a followup with Dr. [name]. For his ongoing cough we will call 5 days of Levaquin for him today.  2. Gastritis/esophagitis.  --Continue zegerid OTC.    Seen by GI with a normal barium swallow.   3. Anorexia.  --Likely secondary to #1. He reports not tolerating marinol so he was prescribed Remeron 30 mg qhs instead. His appetite is improving on remeron.   4. PE (04/21/2013).  --CTA of chest noted above consistent with Nonocclusive left upper lobe, right lower lobe pulmonary emboli. Right heart strain. Continue xarelto 20 mg daily.   5. Anemia secondary to #1 plus/minus chemotherapy, symptomatic with fatigue and lightheadedness when standing.  --He is asymptomatic.  His hemoglobin has dropped. He is on Iron and Vitamin C. Will also take oral B 12 now  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 --We DISCONTINUED cisplatin and now patient is on carboplatin.  He has been fitted and received his hearing aids.   7. Dehydration and hypotension.  --We will continue Normal saline q weekly and will plan to schedule it through home health services. Also will set up some physical therapy for him.  8. Emphysema  --Continue spiriva hanihaler 18 mcg inhalation  daily. Oxygen 2 liters by [place] prn for moderate exertion.   9. Follow-up. RTC in 6 weeks with labs.   All questions were answered. The patient knows to call the clinic with any problems, questions or concerns. We can certainly see the patient much sooner if necessary.  I spent 25 minutes counseling the patient face to face. The total time spent in the appointment was 40 minutes.    Bernadene Bell, MD Medical Hematologist/Oncologist [place] Pager: [phone] Office No: [phone]

## 2013-11-17 ENCOUNTER — Other Ambulatory Visit: Payer: Self-pay | Admitting: *Deleted

## 2013-11-17 DIAGNOSIS — K1230 Oral mucositis (ulcerative), unspecified: Secondary | ICD-10-CM

## 2013-11-17 DIAGNOSIS — I2699 Other pulmonary embolism without acute cor pulmonale: Secondary | ICD-10-CM

## 2013-11-17 DIAGNOSIS — C3490 Malignant neoplasm of unspecified part of unspecified bronchus or lung: Secondary | ICD-10-CM

## 2013-11-17 MED ORDER — RIVAROXABAN 20 MG PO TABS: 20.0000 mg | ORAL_TABLET | Freq: Every day | ORAL | Status: DC

## 2013-11-17 MED ORDER — MORPHINE SULFATE ER 30 MG PO TBCR: 30.0000 mg | EXTENDED_RELEASE_TABLET | Freq: Two times a day (BID) | ORAL | Status: DC

## 2013-11-21 ENCOUNTER — Other Ambulatory Visit: Payer: Self-pay

## 2013-11-21 DIAGNOSIS — I2699 Other pulmonary embolism without acute cor pulmonale: Secondary | ICD-10-CM

## 2013-11-21 DIAGNOSIS — C3490 Malignant neoplasm of unspecified part of unspecified bronchus or lung: Secondary | ICD-10-CM

## 2013-11-21 MED ORDER — RIVAROXABAN 20 MG PO TABS: 20.0000 mg | ORAL_TABLET | Freq: Every day | ORAL | Status: DC

## 2013-11-21 MED ORDER — OMEPRAZOLE-SODIUM BICARBONATE 40-1100 MG PO CAPS: 1.0000 | ORAL_CAPSULE | Freq: Every day | ORAL | Status: DC

## 2013-11-21 NOTE — Telephone Encounter (Signed)
S/w wife about refills. She reported pt is eating better, had scambled eggs and grits for breakfast.

## 2013-11-24 ENCOUNTER — Telehealth: Payer: Self-pay | Admitting: *Deleted

## 2013-11-24 DIAGNOSIS — Z85118 Personal history of other malignant neoplasm of bronchus and lung: Secondary | ICD-10-CM

## 2013-11-24 MED ORDER — LORAZEPAM 0.5 MG PO TABS: 0.5000 mg | ORAL_TABLET | Freq: Two times a day (BID) | ORAL | Status: DC | PRN

## 2013-11-24 MED ORDER — OXYCODONE HCL 5 MG PO TABS: 5.0000 mg | ORAL_TABLET | ORAL | Status: DC | PRN

## 2013-11-24 NOTE — Telephone Encounter (Signed)
Received VM from [place], [place] with [place] stating she just finished home visit and patient needs refill on ativan and oxycodone tablets as patient will run out in the next week. Medications refilled and signed by Dr. Lona Kettle. Called patient's wife, [name], and let her know that both prescriptions will be ready to pick up today.

## 2013-11-24 NOTE — Telephone Encounter (Signed)
Prescription placed in notebook in injection room.

## 2013-12-05 ENCOUNTER — Ambulatory Visit (HOSPITAL_COMMUNITY)
Admission: RE | Admit: 2013-12-05 | Discharge: 2013-12-05 | Disposition: A | Discharge status: Home / Self Care | Payer: BC Managed Care – PPO | Source: Ambulatory Visit | Attending: Radiation Oncology | Admitting: Radiation Oncology

## 2013-12-05 DIAGNOSIS — Z5111 Encounter for antineoplastic chemotherapy: Secondary | ICD-10-CM | POA: Diagnosis not present

## 2013-12-05 DIAGNOSIS — C3490 Malignant neoplasm of unspecified part of unspecified bronchus or lung: Secondary | ICD-10-CM | POA: Diagnosis not present

## 2013-12-05 MED ORDER — GADOBENATE DIMEGLUMINE 529 MG/ML IV SOLN
8.0000 mL | Freq: Once | INTRAVENOUS | Status: AC | PRN
  Administered 2013-12-05: 8 mL via INTRAVENOUS

## 2013-12-08 ENCOUNTER — Ambulatory Visit (HOSPITAL_BASED_OUTPATIENT_CLINIC_OR_DEPARTMENT_OTHER): Payer: BC Managed Care – PPO

## 2013-12-08 ENCOUNTER — Ambulatory Visit
Admission: RE | Admit: 2013-12-08 | Discharge: 2013-12-08 | Disposition: A | Discharge status: Home / Self Care | Payer: BC Managed Care – PPO | Source: Ambulatory Visit | Attending: Radiation Oncology | Admitting: Radiation Oncology

## 2013-12-08 VITALS — BP 119/80 | HR 91

## 2013-12-08 VITALS — BP 113/91 | HR 79 | Temp 98.5°F | Wt 93.5 lb

## 2013-12-08 DIAGNOSIS — C7B09 Secondary carcinoid tumors of other sites: Secondary | ICD-10-CM

## 2013-12-08 DIAGNOSIS — C7931 Secondary malignant neoplasm of brain: Secondary | ICD-10-CM

## 2013-12-08 DIAGNOSIS — C3490 Malignant neoplasm of unspecified part of unspecified bronchus or lung: Secondary | ICD-10-CM

## 2013-12-08 DIAGNOSIS — Z95828 Presence of other vascular implants and grafts: Secondary | ICD-10-CM

## 2013-12-08 DIAGNOSIS — C7A1 Malignant poorly differentiated neuroendocrine tumors: Secondary | ICD-10-CM

## 2013-12-08 DIAGNOSIS — C7949 Secondary malignant neoplasm of other parts of nervous system: Secondary | ICD-10-CM

## 2013-12-08 DIAGNOSIS — Z452 Encounter for adjustment and management of vascular access device: Secondary | ICD-10-CM

## 2013-12-08 MED ORDER — SODIUM CHLORIDE 0.9 % IJ SOLN
10.0000 mL | INTRAMUSCULAR | Status: DC | PRN
  Administered 2013-12-08: 10 mL via INTRAVENOUS
  Filled 2013-12-08: qty 10

## 2013-12-08 MED ORDER — HEPARIN SOD (PORK) LOCK FLUSH 100 UNIT/ML IV SOLN
500.0000 [IU] | Freq: Once | INTRAVENOUS | Status: AC
  Administered 2013-12-08: 500 [IU] via INTRAVENOUS
  Filled 2013-12-08: qty 5

## 2013-12-08 NOTE — Progress Notes (Signed)
Routine follow up lung cancer with brain mets treatment.Denies pai or nausea.To review mri results 12/05/13.No metastatic deposits, mild progression of atrophy.

## 2013-12-08 NOTE — Patient Instructions (Signed)

## 2013-12-09 ENCOUNTER — Telehealth: Payer: Self-pay | Admitting: *Deleted

## 2013-12-09 ENCOUNTER — Telehealth: Payer: Self-pay

## 2013-12-09 NOTE — Telephone Encounter (Signed)
Wife returned call to [place] regarding mri of brain and follow up with dr.Wentworth in January.Appointments and times given.

## 2013-12-09 NOTE — Telephone Encounter (Signed)
CALLED PATIENT TO INFORM OF MRI AND FU, LVM FOR A RETURN CALL

## 2013-12-09 NOTE — Progress Notes (Signed)
Department of Radiation Oncology  Phone:  [phone] Fax:        [phone]   Name: Ricky Villanueva MRN: [id number]  DOB: [birth year]  Date: 12/08/2013  Follow Up Visit Note  Diagnosis: Extensive Stage Small Cell Lung Cancer  Summary and Interval since last radiation: 30 gy in 12 fractions to chest and brain completed 3/25/215  Interval History: Ricky Villanueva presents today for routine followup.  He denies headaches or visual change.His tinnitus has resolved. He had a brain MRI which showed no evidence of disease.  He is still receiving weekly IV fluids which helps his energy. He can walk back and forth to the mailbox.  He spends most of his time in a chair.  He is scheduled to see a covering provider next week and have a CT scan in January.  He still has diminished taste and decreased hearing. He is accompanied by his wife and daughter grateful for his care.   Allergies:  Allergies  Allergen Reactions  . Decadron [Dexamethasone] Other (See Comments)    Slurred speech  . Augmentin [Amoxicillin-Pot Clavulanate] Itching and Rash    rash    Medications:  Current Outpatient Prescriptions  Medication Sig Dispense Refill  . albuterol (PROVENTIL HFA;VENTOLIN HFA) 108 (90 BASE) MCG/ACT inhaler Inhale 2 puffs into the lungs every 6 (six) hours as needed for wheezing or shortness of breath.  1 Inhaler  2  . docusate sodium (COLACE) 100 MG capsule Take 100 mg by mouth 2 (two) times daily.      [name] lidocaine-prilocaine (EMLA) cream Apply to port site one hour before treatment and cover with plastic wrap.  1 each  2  . LORazepam (ATIVAN) 0.5 MG tablet Take 1 tablet (0.5 mg total) by mouth 2 (two) times daily as needed for anxiety.  60 tablet  0  . mirtazapine (REMERON) 30 MG tablet TAKE ONE TABLET BY MOUTH AT BEDTIME  30 tablet  0  . morphine (MS CONTIN) 30 MG 12 hr tablet Take 1 tablet (30 mg total) by mouth every 12 (twelve) hours.  60 tablet  0  . omeprazole (PRILOSEC) 40 MG capsule       .  ondansetron (ZOFRAN) 8 MG tablet Take 8 mg by mouth every 8 (eight) hours as needed for nausea or vomiting.       [name] oxyCODONE (OXY IR/ROXICODONE) 5 MG immediate release tablet Take 1 tablet (5 mg total) by mouth every 4 (four) hours as needed for severe pain.  60 tablet  0  . polyethylene glycol powder (MIRALAX) powder Take 17 g by mouth daily.  255 g  0  . prochlorperazine (COMPAZINE) 10 MG tablet Take 10 mg by mouth every 6 (six) hours as needed for nausea or vomiting.      . rivaroxaban (XARELTO) 20 MG TABS tablet Take 1 tablet (20 mg total) by mouth daily with supper.  30 tablet  2  . tiotropium (SPIRIVA HANDIHALER) 18 MCG inhalation capsule Place 1 capsule (18 mcg total) into inhaler and inhale daily.  90 capsule  1  . levofloxacin (LEVAQUIN) 750 MG tablet Take 1 tablet (750 mg total) by mouth daily.  5 tablet  0  . nystatin (MYCOSTATIN) 100000 UNIT/ML suspension Use as directed 5 mLs in the mouth or throat 4 (four) times daily as needed (after chemo).       [name] omeprazole-sodium bicarbonate (ZEGERID) 40-1100 MG per capsule Take 1 capsule by mouth daily before breakfast.  90 capsule  1  No current facility-administered medications for this encounter.   Facility-Administered Medications Ordered in Other Encounters  Medication Dose Route Frequency Provider Last Rate Last Dose  . 0.9 %  sodium chloride infusion  1,000 mL Intravenous Once Concha Norway, MD      . 0.9 %  sodium chloride infusion   Intravenous Once Concha Norway, MD        Physical Exam:  Filed Vitals:   12/08/13 1420  BP: 113/91  Pulse: 79  Temp: 98.5 F (36.9 C)  Weight: 93 lb 8 oz (42.411 kg)  SpO2: 99%   Appears cachetic but alert. Normal respiratory effort. Nasal cannula in place.   IMPRESSION: Ricky Villanueva is a 63 y.o. male s/p palliative RT to the chest and brain with NED  PLAN:  Doing well. RTC in 3 months with repeat brain MRI.  Will get him set up for permanent follow up in Dr. Worthy Flank clinic (former patient of Dr.  Juliann Mule).     [name], MD

## 2013-12-13 ENCOUNTER — Telehealth: Payer: Self-pay | Admitting: *Deleted

## 2013-12-13 NOTE — Telephone Encounter (Signed)
CALLED PATIENT TO INFORM OF MED/ONC APPT. FOR 12-14-13, SPOKE WITH PATIENT'S WIFE- BONNIE AND THEY ARE AWARE OF THIS APPT.

## 2013-12-14 ENCOUNTER — Ambulatory Visit (HOSPITAL_BASED_OUTPATIENT_CLINIC_OR_DEPARTMENT_OTHER): Payer: BC Managed Care – PPO | Admitting: Hematology

## 2013-12-14 ENCOUNTER — Other Ambulatory Visit (HOSPITAL_BASED_OUTPATIENT_CLINIC_OR_DEPARTMENT_OTHER): Payer: BC Managed Care – PPO

## 2013-12-14 ENCOUNTER — Telehealth: Payer: Self-pay | Admitting: Hematology

## 2013-12-14 ENCOUNTER — Encounter: Payer: Self-pay | Admitting: Hematology

## 2013-12-14 VITALS — BP 116/102 | HR 105 | Temp 97.8°F | Resp 17 | Ht 67.0 in | Wt 95.0 lb

## 2013-12-14 DIAGNOSIS — M545 Low back pain, unspecified: Secondary | ICD-10-CM

## 2013-12-14 DIAGNOSIS — C7B8 Other secondary neuroendocrine tumors: Secondary | ICD-10-CM

## 2013-12-14 DIAGNOSIS — C3490 Malignant neoplasm of unspecified part of unspecified bronchus or lung: Secondary | ICD-10-CM

## 2013-12-14 DIAGNOSIS — D6481 Anemia due to antineoplastic chemotherapy: Secondary | ICD-10-CM

## 2013-12-14 DIAGNOSIS — K209 Esophagitis, unspecified: Secondary | ICD-10-CM

## 2013-12-14 DIAGNOSIS — I2699 Other pulmonary embolism without acute cor pulmonale: Secondary | ICD-10-CM

## 2013-12-14 DIAGNOSIS — Z23 Encounter for immunization: Secondary | ICD-10-CM

## 2013-12-14 DIAGNOSIS — C7A1 Malignant poorly differentiated neuroendocrine tumors: Secondary | ICD-10-CM

## 2013-12-14 DIAGNOSIS — R630 Anorexia: Secondary | ICD-10-CM

## 2013-12-14 DIAGNOSIS — H9190 Unspecified hearing loss, unspecified ear: Secondary | ICD-10-CM

## 2013-12-14 DIAGNOSIS — J439 Emphysema, unspecified: Secondary | ICD-10-CM

## 2013-12-14 DIAGNOSIS — K1230 Oral mucositis (ulcerative), unspecified: Secondary | ICD-10-CM

## 2013-12-14 DIAGNOSIS — E860 Dehydration: Secondary | ICD-10-CM

## 2013-12-14 DIAGNOSIS — K2970 Gastritis, unspecified, without bleeding: Secondary | ICD-10-CM

## 2013-12-14 LAB — COMPREHENSIVE METABOLIC PANEL (CC13)
ALK PHOS: 72 U/L (ref 40–150)
ALT: 13 U/L (ref 0–55)
AST: 14 U/L (ref 5–34)
Albumin: 3.4 g/dL — ABNORMAL LOW (ref 3.5–5.0)
Anion Gap: 7 mEq/L (ref 3–11)
BILIRUBIN TOTAL: 0.46 mg/dL (ref 0.20–1.20)
BUN: 10.5 mg/dL (ref 7.0–26.0)
CO2: 29 mEq/L (ref 22–29)
CREATININE: 0.8 mg/dL (ref 0.7–1.3)
Calcium: 9.7 mg/dL (ref 8.4–10.4)
Chloride: 104 mEq/L (ref 98–109)
Glucose: 148 mg/dl — ABNORMAL HIGH (ref 70–140)
Potassium: 4.1 mEq/L (ref 3.5–5.1)
Sodium: 140 mEq/L (ref 136–145)
TOTAL PROTEIN: 6.5 g/dL (ref 6.4–8.3)

## 2013-12-14 LAB — CBC WITH DIFFERENTIAL/PLATELET
BASO%: 0.2 % (ref 0.0–2.0)
Basophils Absolute: 0 10*3/uL (ref 0.0–0.1)
EOS%: 2.9 % (ref 0.0–7.0)
Eosinophils Absolute: 0.2 10*3/uL (ref 0.0–0.5)
HCT: 35.4 % — ABNORMAL LOW (ref 38.4–49.9)
HGB: 11.7 g/dL — ABNORMAL LOW (ref 13.0–17.1)
LYMPH%: 12.8 % — AB (ref 14.0–49.0)
MCH: 33.5 pg — AB (ref 27.2–33.4)
MCHC: 33.1 g/dL (ref 32.0–36.0)
MCV: 101.4 fL — ABNORMAL HIGH (ref 79.3–98.0)
MONO#: 0.4 10*3/uL (ref 0.1–0.9)
MONO%: 7 % (ref 0.0–14.0)
NEUT#: 4.7 10*3/uL (ref 1.5–6.5)
NEUT%: 77.1 % — ABNORMAL HIGH (ref 39.0–75.0)
PLATELETS: 180 10*3/uL (ref 140–400)
RBC: 3.49 10*6/uL — ABNORMAL LOW (ref 4.20–5.82)
RDW: 13.2 % (ref 11.0–14.6)
WBC: 6.1 10*3/uL (ref 4.0–10.3)
lymph#: 0.8 10*3/uL — ABNORMAL LOW (ref 0.9–3.3)

## 2013-12-14 MED ORDER — INFLUENZA VAC SPLIT QUAD 0.5 ML IM SUSY
0.5000 mL | PREFILLED_SYRINGE | Freq: Once | INTRAMUSCULAR | Status: AC
  Administered 2013-12-14: 0.5 mL via INTRAMUSCULAR
  Filled 2013-12-14: qty 0.5

## 2013-12-14 MED ORDER — PNEUMOCOCCAL VAC POLYVALENT 25 MCG/0.5ML IJ INJ
0.5000 mL | INJECTION | Freq: Once | INTRAMUSCULAR | Status: AC
  Administered 2013-12-14: 0.5 mL via INTRAMUSCULAR
  Filled 2013-12-14: qty 0.5

## 2013-12-14 MED ORDER — MORPHINE SULFATE ER 30 MG PO TBCR: 30.0000 mg | EXTENDED_RELEASE_TABLET | Freq: Two times a day (BID) | ORAL | Status: DC

## 2013-12-14 MED ORDER — OXYCODONE HCL 5 MG PO TABS: 5.0000 mg | ORAL_TABLET | ORAL | Status: DC | PRN

## 2013-12-14 NOTE — Progress Notes (Signed)
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 Here to discuss restaging studies done on 10/27/2013.  Chief Complaint  Patient presents with  . Follow-up    CURRENT CHEMOTHERAPY TREATMENT:    1. He completed First cycle of cisplatin (79 mg/m2 on day #1) plus etoposide (100 mg/m2 on 05/18/13, 05/19/13 and 05/20/13.   2. The change in drugs from Cisplatin to Carboplatin. Cycle # 1 of Carboplatin (on day#1 of 260 mg)  plus etoposide (75 mg/m2 daily on 06/22/13, 06/23/13 and 06/24/13) started on 06/22/13.  He completed neulasta on 06/25/13.  3. Cycle #2 of Carboplatin (on day#1 of 260 mg)  plus etoposide (75 mg/m2 daily on 07/13/13, 07/14/13 and 07/15/13).  He completed neulasta on 07/16/13. 4.  He completed cycle 3 of carboplatin (on day 1 of 260 mg) plus etoposide (75 mg/m2 daily on 08/10/13, 08/11/13, and 08/12/13) and receipt of neulasta on 08/13/13.  5. Cycle 4 Carboplatin and Etoposide given on 09/07/13, 09/08/13, and 09/09/13 and Neulasta given on 09/10/13. 6. Cycle 5 of Carboplatin plus etoposide given on 10/12/13, 10/13/13, 10/14/13. Neulasta given on 10/15/13  NCCN guidelines support 4-6 cycles of induction therapy followed by observation.    PRIOR RADIATION TREATMENT:  WB XRT given on 05/03/13 - 05/18/13 per Dr. Pablo Ledger.   INTERVAL HISTORY:  Ricky Villanueva 63 y.o. male with a history of small cell lung cancer is here for follow up. He was last seen by me on 11/02/2013. His past medical history also includes HTN/Hyperlipidemia and longstanding smoking history but quit 12/25/2011 presented to ER on 04/21/13 with worsening dyspnea, cough with streaks of blood over the past one week. In the ED, his oxygen saturation was 91% on 2 L nasal canula. CXR revealed a progressive increased density in the right lower lobe consistent with  worsening atelectasis or pneumonia and a soft tissue fullness in the hilar region suspicious for lymphadenopathy or mass.and follow-up CT of chest was recommended. It revealed a nonocclusive left upper lobe, right lower lobe pulmonary emboli with right heart strain. Bulky mediastinal and right hilar lymphadenopathy, encasing the of pulmonary arteries, pulmonary veins and bronchi, completely effacing right lower lobe segmental bronchi. Extensive consolidation in the right lung suggested postobstructive pneumonia, with interstitial prominence which may reflect superimposed lymphangitic spread of infection or neoplasm. He was admitted and started on antibiotics and heparin. Pulmonary was consulted and he had a video bronchoscopy on 03/03 with consistent with small cell ca of lung. 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 He had WBXRT as noted above. He then received his first cycle of chemotherapy on 03/25 thru 03/27 and was admitted 04/01 and discharged on 05/31/13 due to dehydration, acute renal failure, mucositis and subsequently had febrile neutropenia. We restarted the Xarelto which held due to profound thrombocytopenia.  Today, he is accompanied by his wife. He reports feeling fatigued and lightheaded when standing. He reports improved appetite on remeron.  He is still having some cough and mucus production and taking Mucinex D helps him.   Overall doing well. No headaches or vison changes. No SOB or hemoptysis. Uses oxygen on prn basis. He is on MS Contin for pain bid and oxycodone for breakthrough pain. Pain control is  adequate. He uses Boost for appetite. Have some gum sensitivity and I told him to use Vit C 500 mg once a day. Also will start taking Iron once a day for anemia. Because of his poor IV access, a port was placed in right internal jugular vein by IR.   CT scan Chest  08/03/2013 showed a positive response to therapy with REGRESSION of the previously noted right hilar mass, and decreased size of mediastinal lymphadenopathy.      MRI BRAIN 08/30/2013 Complete resolution of the tiny areas of enhancement in the right  parietal lobe and right paramedian brainstem. No mass effect or  significant edema. Mild increase in small vessel disease versus post  treatment effect.      CAT scan Chest, abdomen and pelvis done on 10/27/2013 was reviewed.   COMPARISON: 08/01/2013 FINDINGS: CT CHEST FINDINGS Mediastinum/Hilar Regions: Persistent subcarinal soft tissue density measuring 11 mm on image 32 remains stable. No other pathologically enlarged lymph nodes identified in mediastinum or hilar regions. Previously noted mild right hilar lymphadenopathy is no longer seen. Other Thoracic Lymphadenopathy: None. Lungs: Moderate emphysema again demonstrated. Pulmonary nodule in the posterior right lower lobe shows mild decrease in size,currently measuring 10 x 17 mm on image 47 compared to 13 x 19 mm previously. No new or enlarging pulmonary nodules or masses are identified. New patchy airspace opacity is seen in the posterior right lower lobe, likely inflammatory or infectious in etiology. Reticulonodular opacities in the peripheral left lower lobe remains stable. Pleura: No evidence of effusion or mass. Vascular/Cardiac: No thoracic aortic aneurysm or other significant abnormality identified. Musculoskeletal: No suspicious bone lesions identified. Other: None. CT ABDOMEN AND PELVIS FINDINGS Liver: Probable tiny sub-cm hepatic cysts remain stable. No definite liver masses are identified. Gallbladder/Biliary: Unremarkable. Mild dilatation of common bile duct remain stable. Pancreas: No mass, inflammatory changes, or other parenchymal abnormality identified. Spleen: Within normal limits in size and appearance. Adrenal Glands: No mass identified. Kidneys/Urinary Tract: No masses identified.  No evidence of hydronephrosis. Tiny left renal cysts remain stable. Lymph Nodes: No pathologically enlarged lymph nodes identified. Pelvic/Reproductive Organs: No mass or other significant abnormality identified. Bowel/Peritoneum: Large stool burden again demonstrated. No evidence of bowel obstruction or wall thickening. No inflammatory process or abnormal fluid collections identified. Vascular: No evidence of abdominal aortic aneurysm. Musculoskeletal: No suspicious bone lesions identified. Other: None.  IMPRESSION: Decreased size of right lower lobe pulmonary nodule and right hilar lymphadenopathy. Stable mild subcarinal lymphadenopathy. New patchy airspace disease in the posterior right lower lobe, likely inflammatory or infectious in etiology. Continued followup by CT recommended. No evidence of abdominal or pelvic metastatic disease. Large stool burden noted; suggest clinical correlation for possible constipation. Electronically Signed By: Ricky Villanueva M.D. On: 10/27/2013 13:14  He does need a script for Lorazepam and requesting that if we can set up the IV fluids at home through home health services which I think is reasonable. He is still anemic, hypotensive BP 103/84 and mildly tachycardic at heart rate 105.   INTERVAL HISTORY:  Patient has been receiving a liter of fluid every Friday for 4 hours and that gives him the energy and prevent the dehydration. I will continue that weekly fluid resuscitation through end of January 2016. Patient denies any fever chills. Denies any GI symptoms. He is getting a flu and a pneumonia vaccine in the office today. He also got refill for his morphine and oxycodone. 2 weeks ago he pulled his left arm and still l has some pain  there in muscle. He will continue to use a heating pads and if his symptoms do not improve in another 2 weeks we will consider some kind of imaging to that area. Rest of the review systems negative.  Patient had.30 grays in 12 fractions to the  chest and brain completed 05/18/2013. MRI brain done on 12/05/2013 showed complete resolution of previously identified enhancing metastatic deposits. No metastatic deposits are identified. Mild progression of a stuffy and periventricular white matter changes were consistent with treatment effect the     MEDICAL HISTORY: Past Medical History  Diagnosis Date  . Hyperlipidemia   . Hypertension   . GERD (gastroesophageal reflux disease)   . Respiratory failure with hypoxia 04/21/2013    secondary to pneumonia/notes 04/21/2013  . Pulmonary embolism     "got one in there now" (04/21/2013)  . COPD (chronic obstructive pulmonary disease)   . Pneumonia 2/?01/2014    "wouldn't get better; hospitalized 04/21/2013)  . Arthritis     "minor in my right hand" (04/21/2013)  . Cancer     lung ca dx'd 03/2013  . History of radiation therapy 05/03/13-05/18/13    chest & whole brain    INTERIM HISTORY: has GERD (gastroesophageal reflux disease); HCAP (healthcare-associated pneumonia); Respiratory failure with hypoxia; HTN (hypertension); Acute pulmonary embolism; Lung mass; Emphysema lung; Small cell carcinoma of lung; Acute renal failure; Protein-calorie malnutrition, severe; and Anorexia on his problem list.    ALLERGIES:  is allergic to decadron and augmentin.  MEDICATIONS: has a current medication list which includes the following prescription(s): albuterol, docusate sodium, levofloxacin, lidocaine-prilocaine, lorazepam, mirtazapine, morphine, nystatin, omeprazole, omeprazole-sodium bicarbonate, ondansetron, oxycodone, polyethylene glycol powder, prochlorperazine, rivaroxaban, and tiotropium, and the following Facility-Administered Medications: sodium chloride and sodium chloride.  SURGICAL HISTORY:  Past Surgical History  Procedure Laterality Date  . Inguinal hernia repair Right ~ 2005  . Finger surgery Left ~ 1989    "crushed end of my middle finger off"   . Video bronchoscopy Bilateral 04/26/2013     Procedure: VIDEO BRONCHOSCOPY WITH FLUORO;  Surgeon: Wilhelmina Mcardle, MD;  Location: [facility] ENDOSCOPY;  Service: Cardiopulmonary;  Laterality: Bilateral;    REVIEW OF SYSTEMS:   Constitutional: Denies fevers, chills or abnormal weight loss,+fatigue Eyes: Denies blurriness of vision Ears, nose, mouth, throat, and face: Denies mucositis or sore throat Respiratory: + cough, +dyspnea no wheezes Cardiovascular: Denies palpitation, chest discomfort or lower extremity swelling Gastrointestinal:  Denies nausea, heartburn or change in bowel habits Skin: Denies abnormal skin rashes Lymphatics: Denies new lymphadenopathy or easy bruising Neurological:Denies numbness, tingling or new weaknesses Behavioral/Psych: Mood is stable, no new changes  All other systems were reviewed with the patient and are negative.  PHYSICAL EXAMINATION: ECOG PERFORMANCE STATUS: 1-2 KPS 80  Blood pressure 116/102, pulse 105, temperature 97.8 F (36.6 C), temperature source Oral, resp. rate 17, height 5\' 7"  (1.702 m), weight 95 lb (43.092 kg), SpO2 97.00%.  GENERAL:alert, no distress and comfortable; HOH; chronically ill appearing, alopecia.  SKIN: skin color, texture, turgor are normal, no rashes or significant lesions EYES: normal, Conjunctiva are pink and non-injected, sclera clear OROPHARYNX:no exudate, no erythema and lips, buccal mucosa, and tongue normal  NECK: supple, thyroid normal size, non-tender, without nodularity LYMPH:  no palpable lymphadenopathy in the cervical, axillary or supraclavicular LUNGS: clear to auscultation with normal breathing effort, no wheezes or rhonchi HEART: regular rate & rhythm and no murmurs and no lower extremity edema ABDOMEN:abdomen soft, non-tender and normal bowel sounds Musculoskeletal:no cyanosis of digits and no  clubbing  NEURO: alert & oriented x 3 with fluent speech, no focal motor/sensory deficits; Cranial nerves II - XII intact.  Negative rhomberge.  Strength 5/5 in upper  and lower extremities bilaterally.  Gait, normal.   LABS:    CMET PENDING  MRI BRAIN REVIEWED.  ASSESSMENT: Ricky Villanueva 63 y.o. male with a history of SCLC right lung with evidence of brain metastasis making it an Extensive stage which is not curable and treatment goal is "Palliative".   PLAN:  1. 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 Pablo Ledger completed whole brain XRT on 03/25. We started chemotherapy on 03/25 and he completed Cycle #1 On cisplatin 80 mg/m2 on day 1, etoposide 100 mg/m2 days 1,2,3. He had toxicity related to diminished hearing and hospitalization.  He has been evaluated by Dr. Constance Holster who also recommends hearing aides.    --Given his complains of hearing fullness and intermittent tinnitus, we discontinued cisplatin and substituted it with carboplatin AUC 3. In addition, we will decrease etoposide dose by 25% due to profound neutropenia. We will also continue neulasta of Day#4 of his chemotherapy each cycle due to febrile neutropenia. He received chemotherapy on 7/15 for his Cycle #4, day #1 of Carboplatin plus etoposide.  He completed last cycle  As listed above on 10/14/13. NCCN guidelines support 4-6 cycles of induction therapy followed by observation.   --The scan done for restaging is showing ongoing improvement with small tumor burden left. It would be very reasonable to give him a break from chemotherapy. We will repeat another scan in 6 months. I will see him back in 6 weeks. I explained to the patient and his family that the nature of this cancer is such that it usually relapses or recurs. We can hope for the best. His MRI of the brain on 12/05/2013 shows CR in brain.  2. Gastritis/esophagitis.  --Continue zegerid OTC.    Seen by GI with a normal barium swallow.   3. Anorexia.  --Likely secondary to #1. He reports not tolerating marinol so he was prescribed Remeron 30 mg qhs instead. His appetite is improving on  remeron.   4. PE (04/21/2013).  --CTA of chest noted above consistent with Nonocclusive left upper lobe, right lower lobe pulmonary emboli. Right heart strain. Continue xarelto 20 mg daily.   5. Anemia secondary to #1 plus/minus chemotherapy, symptomatic with fatigue and lightheadedness when standing.  --He is asymptomatic.  His hemoglobin is better.[name] He is on Iron and Vitamin C. Will also take oral B 12 now  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 --We DISCONTINUED cisplatin and now patient is on carboplatin.  He has been fitted and received his hearing aids.   7. Dehydration and hypotension.  --We will continue Normal saline q weekly and will plan to schedule it through home health services till end of January 2016.   8. Emphysema  --Continue spiriva hanihaler 18 mcg inhalation daily. Oxygen 2 liters by [place] prn for moderate exertion.   9. Follow-up. RTC in 6 weeks with labs and a PET scan. He did get a flu and pneumonia shot today in office.   All questions were answered. The patient knows to call the clinic with any problems, questions or concerns. We can certainly see the patient much sooner if necessary.  I spent 25 minutes counseling the patient face to face. The total time spent in the appointment was 40 minutes.    Bernadene Bell, MD Medical Hematologist/Oncologist  [place] Pager: [phone] Office No: [phone]

## 2013-12-14 NOTE — Patient Instructions (Signed)
Pneumococcal Vaccine, Polyvalent solution for injection What is this medicine? PNEUMOCOCCAL VACCINE, POLYVALENT (NEU mo KOK al vak SEEN, pol ee VEY luhnt) is a vaccine to prevent pneumococcus bacteria infection. These bacteria are a major cause of ear infections, Strep throat infections, and serious pneumonia, meningitis, or blood infections worldwide. These vaccines help the body to produce antibodies (protective substances) that help your body defend against these bacteria. This vaccine is recommended for people 61 years of age and older with health problems. It is also recommended for all adults over 73 years old. This vaccine will not treat an infection. This medicine may be used for other purposes; ask your health care provider or pharmacist if you have questions. COMMON BRAND NAME(S): Pneumovax 23 What should I tell my health care provider before I take this medicine? They need to know if you have any of these conditions: -bleeding problems -bone marrow or organ transplant -cancer, Hodgkin's disease -fever -infection -immune system problems -low platelet count in the blood -seizures -an unusual or allergic reaction to pneumococcal vaccine, diphtheria toxoid, other vaccines, latex, other medicines, foods, dyes, or preservatives -pregnant or trying to get pregnant -breast-feeding How should I use this medicine? This vaccine is for injection into a muscle or under the skin. It is given by a health care professional. A copy of Vaccine Information Statements will be given before each vaccination. Read this sheet carefully each time. The sheet may change frequently. Talk to your pediatrician regarding the use of this medicine in children. While this drug may be prescribed for children as young as 63 years of age for selected conditions, precautions do apply. Overdosage: If you think you have taken too much of this medicine contact a poison control center or emergency room at once. NOTE: This  medicine is only for you. Do not share this medicine with others. What if I miss a dose? It is important not to miss your dose. Call your doctor or health care professional if you are unable to keep an appointment. What may interact with this medicine? -medicines for cancer chemotherapy -medicines that suppress your immune function -medicines that treat or prevent blood clots like warfarin, enoxaparin, and dalteparin -steroid medicines like prednisone or cortisone This list may not describe all possible interactions. Give your health care provider a list of all the medicines, herbs, non-prescription drugs, or dietary supplements you use. Also tell them if you smoke, drink alcohol, or use illegal drugs. Some items may interact with your medicine. What should I watch for while using this medicine? Mild fever and pain should go away in 3 days or less. Report any unusual symptoms to your doctor or health care professional. What side effects may I notice from receiving this medicine? Side effects that you should report to your doctor or health care professional as soon as possible: -allergic reactions like skin rash, itching or hives, swelling of the face, lips, or tongue -breathing problems -confused -fever over 102 degrees F -pain, tingling, numbness in the hands or feet -seizures -unusual bleeding or bruising -unusual muscle weakness Side effects that usually do not require medical attention (report to your doctor or health care professional if they continue or are bothersome): -aches and pains -diarrhea -fever of 102 degrees F or less -headache -irritable -loss of appetite -pain, tender at site where injected -trouble sleeping This list may not describe all possible side effects. Call your doctor for medical advice about side effects. You may report side effects to FDA at 1-800-FDA-1088. Where should  I keep my medicine? This does not apply. This vaccine is given in a clinic, pharmacy,  doctor's office, or other health care setting and will not be stored at home. NOTE: This sheet is a summary. It may not cover all possible information. If you have questions about this medicine, talk to your doctor, pharmacist, or health care provider.  2015, Elsevier/Gold Standard. (2007-09-17 14:32:37) Influenza Virus Vaccine (Flucelvax) What is this medicine? INFLUENZA VIRUS VACCINE (in floo EN zuh VAHY ruhs vak SEEN) helps to reduce the risk of getting influenza also known as the flu. The vaccine only helps protect you against some strains of the flu. This medicine may be used for other purposes; ask your health care provider or pharmacist if you have questions. COMMON BRAND NAME(S): FLUCELVAX What should I tell my health care provider before I take this medicine? They need to know if you have any of these conditions: -bleeding disorder like hemophilia -fever or infection -Guillain-Barre syndrome or other neurological problems -immune system problems -infection with the human immunodeficiency virus (HIV) or AIDS -low blood platelet counts -multiple sclerosis -an unusual or allergic reaction to influenza virus vaccine, other medicines, foods, dyes or preservatives -pregnant or trying to get pregnant -breast-feeding How should I use this medicine? This vaccine is for injection into a muscle. It is given by a health care professional. A copy of Vaccine Information Statements will be given before each vaccination. Read this sheet carefully each time. The sheet may change frequently. Talk to your pediatrician regarding the use of this medicine in children. Special care may be needed. Overdosage: If you think you've taken too much of this medicine contact a poison control center or emergency room at once. Overdosage: If you think you have taken too much of this medicine contact a poison control center or emergency room at once. NOTE: This medicine is only for you. Do not share this medicine  with others. What if I miss a dose? This does not apply. What may interact with this medicine? -chemotherapy or radiation therapy -medicines that lower your immune system like etanercept, anakinra, infliximab, and adalimumab -medicines that treat or prevent blood clots like warfarin -phenytoin -steroid medicines like prednisone or cortisone -theophylline -vaccines This list may not describe all possible interactions. Give your health care provider a list of all the medicines, herbs, non-prescription drugs, or dietary supplements you use. Also tell them if you smoke, drink alcohol, or use illegal drugs. Some items may interact with your medicine. What should I watch for while using this medicine? Report any side effects that do not go away within 3 days to your doctor or health care professional. Call your health care provider if any unusual symptoms occur within 6 weeks of receiving this vaccine. You may still catch the flu, but the illness is not usually as bad. You cannot get the flu from the vaccine. The vaccine will not protect against colds or other illnesses that may cause fever. The vaccine is needed every year. What side effects may I notice from receiving this medicine? Side effects that you should report to your doctor or health care professional as soon as possible: -allergic reactions like skin rash, itching or hives, swelling of the face, lips, or tongue Side effects that usually do not require medical attention (Report these to your doctor or health care professional if they continue or are bothersome.): -fever -headache -muscle aches and pains -pain, tenderness, redness, or swelling at the injection site -tiredness This list may not describe  all possible side effects. Call your doctor for medical advice about side effects. You may report side effects to FDA at 1-800-FDA-1088. Where should I keep my medicine? The vaccine will be given by a health care professional in a clinic,  pharmacy, doctor's office, or other health care setting. You will not be given vaccine doses to store at home. NOTE: This sheet is a summary. It may not cover all possible information. If you have questions about this medicine, talk to your doctor, pharmacist, or health care provider.  2015, Elsevier/Gold Standard. (2011-01-22 14:06:47)

## 2013-12-14 NOTE — Telephone Encounter (Signed)
Gave avs & appt d/t for Jan.

## 2013-12-22 ENCOUNTER — Telehealth: Payer: Self-pay

## 2013-12-22 NOTE — Telephone Encounter (Signed)
S/w debbie, they are trying to get documentation of PAC placement for insurance purposes. Report printed and placed on wilma's desk for pickup.

## 2013-12-26 ENCOUNTER — Telehealth: Payer: Self-pay | Admitting: Family Medicine

## 2013-12-26 NOTE — Telephone Encounter (Signed)
appt given tomorrow with mmm @ 1

## 2013-12-27 ENCOUNTER — Ambulatory Visit (INDEPENDENT_AMBULATORY_CARE_PROVIDER_SITE_OTHER): Payer: BC Managed Care – PPO | Admitting: Nurse Practitioner

## 2013-12-27 ENCOUNTER — Encounter: Payer: Self-pay | Admitting: Nurse Practitioner

## 2013-12-27 VITALS — BP 108/82 | HR 94 | Temp 97.0°F | Ht 67.0 in | Wt 97.2 lb

## 2013-12-27 DIAGNOSIS — M79622 Pain in left upper arm: Secondary | ICD-10-CM

## 2013-12-27 NOTE — Progress Notes (Signed)
   Subjective:    Patient ID: Ricky Villanueva, male    DOB: [birth year], 63 y.o.   MRN: [id number]  HPI Patient in today c/o left arm pain- Patient says that he pulled a muscle in his arm several weeks ago trying  To pull hisself up. Only has pain when he moves arm. Has numbness in hand from chemotherapy for lung cancer.     Review of Systems  Constitutional: Negative.   HENT: Negative.   Respiratory: Negative.   Cardiovascular: Negative.   Genitourinary: Negative.   Neurological: Negative.   Psychiatric/Behavioral: Negative.   All other systems reviewed and are negative.      Objective:   Physical Exam  Constitutional: He is oriented to person, place, and time.  Musculoskeletal:  Pain in left bicep area- pain increases with internal and external rotation of left arm.  Neurological: He is alert and oriented to person, place, and time.  Skin: Skin is warm.  Psychiatric: He has a normal mood and affect. His behavior is normal. Judgment and thought content normal.   BP 108/82 mmHg  Pulse 94  Temp(Src) 97 F (36.1 C) (Oral)  Ht 5\' 7"  (1.702 m)  Wt 97 lb 3.2 oz (44.09 kg)  BMI 15.22 kg/m2        Assessment & Plan:   1. Pain of left upper arm    Ace wrap Motrin or tylenol OTC  Mary-Margaret Hassell Done, FNP

## 2014-01-02 ENCOUNTER — Other Ambulatory Visit: Payer: Self-pay | Admitting: Hematology

## 2014-01-02 DIAGNOSIS — C3480 Malignant neoplasm of overlapping sites of unspecified bronchus and lung: Secondary | ICD-10-CM

## 2014-01-02 MED ORDER — AZITHROMYCIN 250 MG PO TABS: ORAL_TABLET | ORAL | Status: DC

## 2014-01-02 NOTE — Telephone Encounter (Signed)
Wife called asking for lorazepam refil. [phone] called wife back that lorazepam was faxed. Wife stated Deagan has been coughing up thick yellow to green sputum. He is using Mucinex DM. Has had this since before 10/21 visit. He received flu and pneumonia shots on 10/21. S/w Dr Lona Kettle and he ordered z-pack. Wife informed.

## 2014-01-06 ENCOUNTER — Emergency Department (HOSPITAL_COMMUNITY)
Admission: EM | Admit: 2014-01-06 | Discharge: 2014-01-06 | Disposition: A | Discharge status: Home / Self Care | Payer: BC Managed Care – PPO | Attending: Emergency Medicine | Admitting: Emergency Medicine

## 2014-01-06 ENCOUNTER — Emergency Department (HOSPITAL_COMMUNITY): Payer: BC Managed Care – PPO

## 2014-01-06 ENCOUNTER — Encounter (HOSPITAL_COMMUNITY): Payer: Self-pay | Admitting: Radiology

## 2014-01-06 ENCOUNTER — Telehealth: Payer: Self-pay

## 2014-01-06 DIAGNOSIS — Z8589 Personal history of malignant neoplasm of other organs and systems: Secondary | ICD-10-CM | POA: Diagnosis not present

## 2014-01-06 DIAGNOSIS — Z8701 Personal history of pneumonia (recurrent): Secondary | ICD-10-CM | POA: Diagnosis not present

## 2014-01-06 DIAGNOSIS — Z923 Personal history of irradiation: Secondary | ICD-10-CM | POA: Diagnosis not present

## 2014-01-06 DIAGNOSIS — K219 Gastro-esophageal reflux disease without esophagitis: Secondary | ICD-10-CM | POA: Diagnosis not present

## 2014-01-06 DIAGNOSIS — J700 Acute pulmonary manifestations due to radiation: Secondary | ICD-10-CM | POA: Insufficient documentation

## 2014-01-06 DIAGNOSIS — Z86711 Personal history of pulmonary embolism: Secondary | ICD-10-CM | POA: Diagnosis not present

## 2014-01-06 DIAGNOSIS — I10 Essential (primary) hypertension: Secondary | ICD-10-CM | POA: Diagnosis not present

## 2014-01-06 DIAGNOSIS — Z792 Long term (current) use of antibiotics: Secondary | ICD-10-CM | POA: Insufficient documentation

## 2014-01-06 DIAGNOSIS — W888XXA Exposure to other ionizing radiation, initial encounter: Secondary | ICD-10-CM | POA: Diagnosis not present

## 2014-01-06 DIAGNOSIS — Z79899 Other long term (current) drug therapy: Secondary | ICD-10-CM | POA: Insufficient documentation

## 2014-01-06 DIAGNOSIS — Z880 Allergy status to penicillin: Secondary | ICD-10-CM | POA: Diagnosis not present

## 2014-01-06 DIAGNOSIS — R0602 Shortness of breath: Secondary | ICD-10-CM

## 2014-01-06 DIAGNOSIS — Z87891 Personal history of nicotine dependence: Secondary | ICD-10-CM | POA: Diagnosis not present

## 2014-01-06 LAB — CBC
HEMATOCRIT: 34.7 % — AB (ref 39.0–52.0)
HEMOGLOBIN: 11.8 g/dL — AB (ref 13.0–17.0)
MCH: 33.4 pg (ref 26.0–34.0)
MCHC: 34 g/dL (ref 30.0–36.0)
MCV: 98.3 fL (ref 78.0–100.0)
Platelets: 220 10*3/uL (ref 150–400)
RBC: 3.53 MIL/uL — ABNORMAL LOW (ref 4.22–5.81)
RDW: 12.3 % (ref 11.5–15.5)
WBC: 5.6 10*3/uL (ref 4.0–10.5)

## 2014-01-06 LAB — I-STAT TROPONIN, ED: Troponin i, poc: 0 ng/mL (ref 0.00–0.08)

## 2014-01-06 LAB — BASIC METABOLIC PANEL
Anion gap: 14 (ref 5–15)
BUN: 10 mg/dL (ref 6–23)
CO2: 25 mEq/L (ref 19–32)
CREATININE: 0.73 mg/dL (ref 0.50–1.35)
Calcium: 9.7 mg/dL (ref 8.4–10.5)
Chloride: 100 mEq/L (ref 96–112)
GLUCOSE: 123 mg/dL — AB (ref 70–99)
Potassium: 4.3 mEq/L (ref 3.7–5.3)
Sodium: 139 mEq/L (ref 137–147)

## 2014-01-06 LAB — PRO B NATRIURETIC PEPTIDE: Pro B Natriuretic peptide (BNP): 194 pg/mL — ABNORMAL HIGH (ref 0–125)

## 2014-01-06 MED ORDER — ONDANSETRON HCL 4 MG/2ML IJ SOLN
4.0000 mg | Freq: Once | INTRAMUSCULAR | Status: AC
  Administered 2014-01-06: 4 mg via INTRAVENOUS
  Filled 2014-01-06: qty 2

## 2014-01-06 MED ORDER — FLUTICASONE-SALMETEROL 250-50 MCG/DOSE IN AEPB: 1.0000 | INHALATION_SPRAY | Freq: Two times a day (BID) | RESPIRATORY_TRACT | Status: DC

## 2014-01-06 MED ORDER — PREDNISONE 20 MG PO TABS: 20.0000 mg | ORAL_TABLET | Freq: Every day | ORAL | Status: DC

## 2014-01-06 MED ORDER — ALBUTEROL SULFATE (2.5 MG/3ML) 0.083% IN NEBU
5.0000 mg | INHALATION_SOLUTION | Freq: Once | RESPIRATORY_TRACT | Status: AC
  Administered 2014-01-06: 5 mg via RESPIRATORY_TRACT
  Filled 2014-01-06: qty 6

## 2014-01-06 MED ORDER — HYDROMORPHONE HCL 1 MG/ML IJ SOLN
1.0000 mg | Freq: Once | INTRAMUSCULAR | Status: AC
  Administered 2014-01-06: 1 mg via INTRAVENOUS
  Filled 2014-01-06: qty 1

## 2014-01-06 MED ORDER — METHYLPREDNISOLONE SODIUM SUCC 125 MG IJ SOLR
125.0000 mg | Freq: Once | INTRAMUSCULAR | Status: AC
  Administered 2014-01-06: 125 mg via INTRAVENOUS
  Filled 2014-01-06: qty 2

## 2014-01-06 MED ORDER — IPRATROPIUM BROMIDE 0.02 % IN SOLN
0.5000 mg | Freq: Once | RESPIRATORY_TRACT | Status: AC
  Administered 2014-01-06: 0.5 mg via RESPIRATORY_TRACT
  Filled 2014-01-06: qty 2.5

## 2014-01-06 MED ORDER — SODIUM CHLORIDE 0.9 % IV BOLUS (SEPSIS)
1000.0000 mL | Freq: Once | INTRAVENOUS | Status: AC
Start: 2014-01-06 — End: 2014-01-06
  Administered 2014-01-06: 1000 mL via INTRAVENOUS

## 2014-01-06 MED ORDER — IOHEXOL 300 MG/ML  SOLN
100.0000 mL | Freq: Once | INTRAMUSCULAR | Status: AC | PRN
  Administered 2014-01-06: 100 mL via INTRAVENOUS

## 2014-01-06 MED ORDER — FENTANYL CITRATE 0.05 MG/ML IJ SOLN
50.0000 ug | Freq: Once | INTRAMUSCULAR | Status: AC
  Administered 2014-01-06: 50 ug via INTRAVENOUS
  Filled 2014-01-06: qty 2

## 2014-01-06 NOTE — ED Notes (Signed)
md at bedside

## 2014-01-06 NOTE — Telephone Encounter (Signed)
[name] called stating her father was not doing well and felt he needed to come to hospital. He is dizzy, nauseated and unable to stand. When asked if he has active chest pain or SOB, they described severe SOB. I told her to take him to ER per Drue Second NP.

## 2014-01-06 NOTE — ED Notes (Signed)
Pt requesting pain meds. Pt c/o slight nausea. States he doesn't feel much better after breathing tx.

## 2014-01-06 NOTE — ED Provider Notes (Addendum)
CSN: [id number]     Arrival date & time 01/06/14  1320 History   First MD Initiated Contact with Patient 01/06/14 1501     Chief Complaint  Patient presents with  . ca pt, SOB, fever      (Consider location/radiation/quality/duration/timing/severity/associated sxs/prior Treatment) Patient is a 63 y.o. male presenting with shortness of breath. The history is provided by the patient.  Shortness of Breath Severity:  Moderate Onset quality:  Sudden Duration:  1 day Timing:  Constant Progression:  Unchanged Chronicity:  Recurrent Context comment:  Started last night at rest.  states was having trouble breathing last night and worse today.   Relieved by:  Nothing Worsened by:  Activity Ineffective treatments:  Rest and position changes Associated symptoms: chest pain, cough, fever, sputum production and wheezing   Associated symptoms: no abdominal pain, no syncope and no vomiting   Associated symptoms comment:  Cough has been ongoing for about a month but more prominent now and yellow/green sputum.  Temp up to 98.0.  Has been on azithro for the last 4 days without improvement.  Off chemo for 2 months.  Does not use inhalers regularly.  Also states not drinking or eating much due to not feeling like it. Risk factors: hx of cancer and hx of PE/DVT   Risk factors: no recent alcohol use, no recent surgery and no tobacco use   Risk factors comment:  Currently on xarelto.  last scan clear for brain mets and only small nodule in RLL on 9/15   Past Medical History  Diagnosis Date  . Hyperlipidemia   . Hypertension   . GERD (gastroesophageal reflux disease)   . Respiratory failure with hypoxia 04/21/2013    secondary to pneumonia/notes 04/21/2013  . Pulmonary embolism     "got one in there now" (04/21/2013)  . COPD (chronic obstructive pulmonary disease)   . Pneumonia 2/?01/2014    "wouldn't get better; hospitalized 04/21/2013)  . Arthritis     "minor in my right hand" (04/21/2013)  . Cancer      lung ca dx'd 03/2013  . History of radiation therapy 05/03/13-05/18/13    chest & whole brain   Past Surgical History  Procedure Laterality Date  . Inguinal hernia repair Right ~ 2005  . Finger surgery Left ~ 1989    "crushed end of my middle finger off"   . Video bronchoscopy Bilateral 04/26/2013    Procedure: VIDEO BRONCHOSCOPY WITH FLUORO;  Surgeon: Wilhelmina Mcardle, MD;  Location: [facility] ENDOSCOPY;  Service: Cardiopulmonary;  Laterality: Bilateral;   Family History  Problem Relation Age of Onset  . Breast cancer Sister   . Emphysema Mother   . Diabetes Father    History  Substance Use Topics  . Smoking status: Former Smoker -- 2.00 packs/day for 47 years    Types: Cigarettes    Quit date: 12/25/2011  . Smokeless tobacco: Never Used  . Alcohol Use: No     Comment: 04/21/2013 "used to drink a little bit; very little; nothing in years"    Review of Systems  Constitutional: Positive for fever.  Respiratory: Positive for cough, sputum production, shortness of breath and wheezing.   Cardiovascular: Positive for chest pain. Negative for syncope.  Gastrointestinal: Negative for vomiting and abdominal pain.  All other systems reviewed and are negative.     Allergies  Decadron and Augmentin  Home Medications   Prior to Admission medications   Medication Sig Start Date End Date Taking? Authorizing Provider  albuterol (  PROVENTIL HFA;VENTOLIN HFA) 108 (90 BASE) MCG/ACT inhaler Inhale 2 puffs into the lungs every 6 (six) hours as needed for wheezing or shortness of breath. 04/29/13  Yes Orson Eva, MD  azithromycin (ZITHROMAX Z-PAK) 250 MG tablet Take per package directions Patient taking differently: Take 250-500 mg by mouth. Take 2 Tablet Daily X 1 Day then Take 1 Tablet Daily X 4 Days. 01/02/14  Yes Aasim Marla Roe, MD  docusate sodium (COLACE) 100 MG capsule Take 100 mg by mouth 2 (two) times daily.   Yes Historical Provider, MD  Iron-Vitamins (GERITOL PO) Take 1 tablet by mouth  daily.   Yes Historical Provider, MD  lidocaine-prilocaine (EMLA) cream Apply to port site one hour before treatment and cover with plastic wrap. 09/06/13  Yes Concha Norway, MD  LORazepam (ATIVAN) 0.5 MG tablet Take 0.5 mg by mouth 2 (two) times daily as needed for anxiety (anxiety).   Yes Historical Provider, MD  mirtazapine (REMERON) 30 MG tablet Take 30 mg by mouth at bedtime.   Yes Historical Provider, MD  morphine (MS CONTIN) 30 MG 12 hr tablet Take 1 tablet (30 mg total) by mouth every 12 (twelve) hours. 12/14/13  Yes Aasim Marla Roe, MD  nystatin (MYCOSTATIN) 100000 UNIT/ML suspension Use as directed 5 mLs in the mouth or throat 4 (four) times daily as needed (after chemo).  06/29/13  Yes Carlton Adam, PA-C  omeprazole (PRILOSEC) 40 MG capsule Take 40 mg by mouth daily.  11/22/13  Yes Historical Provider, MD  ondansetron (ZOFRAN) 8 MG tablet Take 8 mg by mouth every 8 (eight) hours as needed for nausea or vomiting (nausea).  06/02/13  Yes Historical Provider, MD  oxyCODONE (OXY IR/ROXICODONE) 5 MG immediate release tablet Take 1 tablet (5 mg total) by mouth every 4 (four) hours as needed for severe pain. 12/14/13  Yes Aasim Marla Roe, MD  polyethylene glycol powder (MIRALAX) powder Take 17 g by mouth daily. 08/20/13  Yes Hoy Morn, MD  prochlorperazine (COMPAZINE) 10 MG tablet Take 10 mg by mouth every 6 (six) hours as needed for nausea or vomiting (nausea).    Yes Historical Provider, MD  rivaroxaban (XARELTO) 20 MG TABS tablet Take 1 tablet (20 mg total) by mouth daily with supper. 11/21/13  Yes Aasim Marla Roe, MD  tiotropium (SPIRIVA HANDIHALER) 18 MCG inhalation capsule Place 1 capsule (18 mcg total) into inhaler and inhale daily. 08/02/13  Yes Concha Norway, MD  vitamin B-12 (CYANOCOBALAMIN) 100 MCG tablet Take 100 mcg by mouth daily.   Yes Historical Provider, MD  levofloxacin (LEVAQUIN) 750 MG tablet Take 1 tablet (750 mg total) by mouth daily. 10/07/13   Concha Norway, MD   LORazepam (ATIVAN) 0.5 MG tablet TAKE ONE TABLET BY MOUTH TWICE DAILY AS NEEDED FOR ANXIETY 01/02/14   Aasim Marla Roe, MD  mirtazapine (REMERON) 30 MG tablet TAKE ONE TABLET BY MOUTH AT BEDTIME    Concha Norway, MD  omeprazole-sodium bicarbonate (ZEGERID) 40-1100 MG per capsule Take 1 capsule by mouth daily before breakfast. 11/21/13   Aasim Marla Roe, MD   BP 109/82 mmHg  Pulse 90  Temp(Src) 98.4 F (36.9 C) (Oral)  Resp 12  SpO2 100% Physical Exam  Constitutional: He is oriented to person, place, and time. He appears well-developed. He appears cachectic. No distress.  HENT:  Head: Normocephalic and atraumatic.  Mouth/Throat: Oropharynx is clear and moist.  Eyes: Conjunctivae and EOM are normal. Pupils are equal, round, and reactive to light.  Neck: Normal range  of motion. Neck supple.  Cardiovascular: Normal rate, regular rhythm and intact distal pulses.   No murmur heard. Pulmonary/Chest: Effort normal. No respiratory distress. He has no wheezes. He has rhonchi. He has rales.  Abdominal: Soft. He exhibits no distension. There is no tenderness. There is no rebound and no guarding.  Musculoskeletal: Normal range of motion. He exhibits no edema or tenderness.  Neurological: He is alert and oriented to person, place, and time.  Skin: Skin is warm and dry. No rash noted. No erythema.  Psychiatric: He has a normal mood and affect. His behavior is normal.  Nursing note and vitals reviewed.   ED Course  Procedures (including critical care time) Labs Review Labs Reviewed  CBC - Abnormal; Notable for the following:    RBC 3.53 (*)    Hemoglobin 11.8 (*)    HCT 34.7 (*)    All other components within normal limits  BASIC METABOLIC PANEL - Abnormal; Notable for the following:    Glucose, Bld 123 (*)    All other components within normal limits  PRO B NATRIURETIC PEPTIDE - Abnormal; Notable for the following:    Pro B Natriuretic peptide (BNP) 194.0 (*)    All other  components within normal limits  I-STAT TROPOININ, ED    Imaging Review Dg Chest 2 View  01/06/2014   CLINICAL DATA:  Shortness of breath and dizziness today. History of lung cancer.  EXAM: CHEST  2 VIEW  COMPARISON:  Chest x-ray 10/05/2013 and chest CT 10/27/2013  FINDINGS: The power port is stable. The cardiac silhouette, mediastinal hilar contours are within normal limits and unchanged. Stable emphysematous changes. Increased airspace density in the right lower lung could be pneumonia or radiation change. The left lung is clear. The bony thorax is intact.  IMPRESSION: Right lower lobe process could be pneumonia or radiation change.  Stable emphysema.   Electronically Signed   By: Kalman Jewels M.D.   On: 01/06/2014 14:49   Ct Chest W Contrast  01/06/2014   CLINICAL DATA:  Shortness of breath, lung cancer in Feb 2015 post radiation therapy  EXAM: CT CHEST WITH CONTRAST  TECHNIQUE: Multidetector CT imaging of the chest was performed during intravenous contrast administration. Sagittal and coronal MPR images reconstructed from axial data set.  CONTRAST:  182mL OMNIPAQUE IOHEXOL 300 MG/ML  SOLN IV  COMPARISON:  10/27/2013  FINDINGS: Scattered atherosclerotic calcifications aorta and coronary arteries.  Vascular structures grossly patent on nondedicated exam.  No thoracic adenopathy ; a small amount of fluid attenuation is seen at subcarinal region of the site were lymph node has been seen previously though an enlarged lymph node is not identified on the current exam.  RIGHT jugular Port-A-Cath with tip in SVC.  Tiny nonspecific low-attenuation focus RIGHT lobe liver 4 mm diameter image 63 unchanged.  Remaining visualized upper abdomen unremarkable.  Severe emphysematous changes with central peribronchial thickening.  Increased infiltration in the RIGHT lower lobe extending into perihilar and paramediastinal regions may reflect radiation pneumonitis changes.  Interval decrease in size of RIGHT lower lobe  mass since previous study, 17 x 8 mm image 48 previously 18 x 10 mm by my measurement.  No additional mass, nodule, infiltrate, or pleural effusion.  Minimal dependent atelectasis in LEFT lower lobe.  No acute osseous findings.  IMPRESSION: Increased infiltrative changes in the RIGHT lower lobe and perihilar/paramediastinal regions of the RIGHT lung, suspect related to radiation pneumonitis. Bb  Minimal decrease in size of RIGHT lower lobe mass.  Underlying severe emphysematous and bronchitic changes.  No new intrathoracic abnormalities identified.   Electronically Signed   By: Lavonia Dana M.D.   On: 01/06/2014 16:42     EKG Interpretation   Date/Time:  Friday January 06 2014 13:32:57 EST Ventricular Rate:  91 PR Interval:  158 QRS Duration: 77 QT Interval:  337 QTC Calculation: 415 R Axis:   92 Text Interpretation:  Sinus rhythm Atrial premature complexes Right axis  deviation Probable anteroseptal infarct, old since last tracing no  significant change Confirmed by Eulis Foster  MD, ELLIOTT [phone]) on 01/06/2014  3:00:47 PM      MDM   Final diagnoses:  Post-radiation pneumonitis    Patient with a history of lung CA who has finished chemotherapy and radiation. Last scans done in September showed improvement in right pulmonary nodule and lymphadenopathy but possible inflammatory versus infectious component and MRI of the brain showed resolution of metastases.  Patient for approximately 1 month has had a persistent cough which she has taken azithromycin, Levaquin and most recently started back on azithromycin 4 days ago for sputum production. Yesterday he began having more shortness of breath worse at night and with lying down not improved with his home oxygen. Also states his temperature was running about 99 when it normally is 95-96.  He also has decreased by mouth intake and states he just doesn't feel hungry. He received IV fluids by home nursing yesterday but because of his worsening shortness  of breath came here today for evaluation. Patient has a history of COPD but does not use inhalers at home. On exam he has diffuse mild rhonchi and crackles but no definitive wheezing. He is not in any acute distress at this time and had received fentanyl and Zofran prior to being evaluated which she said improved his pain and nausea.  Low suspicion for cardiac cause at this time with a negative troponin, unchanged EKG and normal BNP. Concern for possible pneumonia versus COPD exacerbation. CBC and BMP are within normal limits. Chest x-ray shows a right lower lobe process which could be pneumonia versus radiation scarring. Because this is also seen on his September CT scan we'll repeat a CT to ensure no new symptoms  4:55 PM CT shows extensive pneumonitis which is worsening.  Will discuss with onc.  Pt does not have improvement in sx with albuterol/atrovent.  5:43 PM  Spoke with Dr. Lindi Adie and recommended steroids and advair.  Pt's O2 sats have been ok here and gave pt the option of admission vs home.  Pt chose to go home.  Will start meds and have him f/u with [facility] on Monday or tues.  Family and pt given strict return precautions.  Pt intially had normal HR and only became tachy after albuterol.  Blanchie Dessert, MD 01/06/14 [place], MD 01/06/14 [phone]

## 2014-01-06 NOTE — ED Notes (Signed)
Pt to xray

## 2014-01-06 NOTE — ED Notes (Signed)
Patient transported to CT 

## 2014-01-06 NOTE — ED Notes (Signed)
Pt reports dx with lung cancer 03/2013, has not had chemo and radiation in over 2 months. Wears 2 L [place] continously at home. Reports increased SOB last night. Lung pain 4/10. Able to speak in full sentences. Pt has been fever 99 degrees, which is very high for him. Reports diarrhea, nausea, and dizziness. Just finished a z pack abx and home health nurse gave IV fluids yesterday.

## 2014-01-09 ENCOUNTER — Telehealth: Payer: Self-pay

## 2014-01-09 ENCOUNTER — Telehealth: Payer: Self-pay | Admitting: Hematology

## 2014-01-09 NOTE — Telephone Encounter (Signed)
s.w. pt daughter and advised on 11.17 appt....ok adn aware

## 2014-01-09 NOTE — Telephone Encounter (Signed)
[name] called stating ED told them to f/u with Dr Lona Kettle this week. POF sent to schedule pt tomorrow.

## 2014-01-10 ENCOUNTER — Telehealth: Payer: Self-pay | Admitting: Family Medicine

## 2014-01-10 ENCOUNTER — Encounter: Payer: Self-pay | Admitting: Hematology

## 2014-01-10 ENCOUNTER — Telehealth: Payer: Self-pay | Admitting: Hematology

## 2014-01-10 ENCOUNTER — Ambulatory Visit (HOSPITAL_BASED_OUTPATIENT_CLINIC_OR_DEPARTMENT_OTHER): Payer: BC Managed Care – PPO | Admitting: Hematology

## 2014-01-10 ENCOUNTER — Other Ambulatory Visit: Payer: BC Managed Care – PPO

## 2014-01-10 VITALS — BP 115/79 | HR 74 | Resp 17 | Ht 67.0 in | Wt 94.6 lb

## 2014-01-10 DIAGNOSIS — C3490 Malignant neoplasm of unspecified part of unspecified bronchus or lung: Secondary | ICD-10-CM

## 2014-01-10 DIAGNOSIS — E860 Dehydration: Secondary | ICD-10-CM

## 2014-01-10 DIAGNOSIS — J700 Acute pulmonary manifestations due to radiation: Secondary | ICD-10-CM

## 2014-01-10 DIAGNOSIS — K209 Esophagitis, unspecified: Secondary | ICD-10-CM

## 2014-01-10 DIAGNOSIS — D630 Anemia in neoplastic disease: Secondary | ICD-10-CM

## 2014-01-10 DIAGNOSIS — C7A1 Malignant poorly differentiated neuroendocrine tumors: Secondary | ICD-10-CM

## 2014-01-10 DIAGNOSIS — I959 Hypotension, unspecified: Secondary | ICD-10-CM

## 2014-01-10 DIAGNOSIS — C7B8 Other secondary neuroendocrine tumors: Secondary | ICD-10-CM

## 2014-01-10 DIAGNOSIS — K2970 Gastritis, unspecified, without bleeding: Secondary | ICD-10-CM

## 2014-01-10 DIAGNOSIS — I2699 Other pulmonary embolism without acute cor pulmonale: Secondary | ICD-10-CM

## 2014-01-10 DIAGNOSIS — H9190 Unspecified hearing loss, unspecified ear: Secondary | ICD-10-CM

## 2014-01-10 DIAGNOSIS — H6120 Impacted cerumen, unspecified ear: Secondary | ICD-10-CM

## 2014-01-10 MED ORDER — PREDNISONE 20 MG PO TABS: 10.0000 mg | ORAL_TABLET | Freq: Every day | ORAL | Status: DC

## 2014-01-10 NOTE — Progress Notes (Signed)
[facility] PROGRESS NOTE DATE OF VISIT: 01/10/2014.  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 Follow up after and ED visit on 01/06/2014.  Chief Complaint  Patient presents with  . Follow-up    CURRENT CHEMOTHERAPY TREATMENT:    1. He completed First cycle of cisplatin (79 mg/m2 on day #1) plus etoposide (100 mg/m2 on 05/18/13, 05/19/13 and 05/20/13.   2. The change in drugs from Cisplatin to Carboplatin. Cycle # 1 of Carboplatin (on day#1 of 260 mg)  plus etoposide (75 mg/m2 daily on 06/22/13, 06/23/13 and 06/24/13) started on 06/22/13.  He completed neulasta on 06/25/13.  3. Cycle #2 of Carboplatin (on day#1 of 260 mg)  plus etoposide (75 mg/m2 daily on 07/13/13, 07/14/13 and 07/15/13).  He completed neulasta on 07/16/13. 4.  He completed cycle 3 of carboplatin (on day 1 of 260 mg) plus etoposide (75 mg/m2 daily on 08/10/13, 08/11/13, and 08/12/13) and receipt of neulasta on 08/13/13.  5. Cycle 4 Carboplatin and Etoposide given on 09/07/13, 09/08/13, and 09/09/13 and Neulasta given on 09/10/13. 6. Cycle 5 of Carboplatin plus etoposide given on 10/12/13, 10/13/13, 10/14/13. Neulasta given on 10/15/13  NCCN guidelines support 4-6 cycles of induction therapy followed by observation.    PRIOR RADIATION TREATMENT:  WB XRT given on 05/03/13 - 05/18/13 per Dr. Pablo Ledger.   INTERVAL HISTORY:  Ricky Villanueva 63 y.o. male with a history of small cell lung cancer is here for follow up. He was last seen by me on 12/14/2013. His past medical history also includes HTN/Hyperlipidemia and longstanding smoking history but quit 12/25/2011 presented to ER on 04/21/13 with worsening dyspnea, cough with streaks of blood over the past one week. In the ED, his oxygen saturation was 91% on 2 L nasal canula. CXR revealed a progressive increased density in the right lower lobe consistent with worsening  atelectasis or pneumonia and a soft tissue fullness in the hilar region suspicious for lymphadenopathy or mass.and follow-up CT of chest was recommended. It revealed a nonocclusive left upper lobe, right lower lobe pulmonary emboli with right heart strain. Bulky mediastinal and right hilar lymphadenopathy, encasing the of pulmonary arteries, pulmonary veins and bronchi, completely effacing right lower lobe segmental bronchi. Extensive consolidation in the right lung suggested postobstructive pneumonia, with interstitial prominence which may reflect superimposed lymphangitic spread of infection or neoplasm. He was admitted and started on antibiotics and heparin. Pulmonary was consulted and he had a video bronchoscopy on 03/03 with consistent with small cell ca of lung. 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 He had WBXRT as noted above. He then received his first cycle of chemotherapy on 03/25 thru 03/27 and was admitted 04/01 and discharged on 05/31/13 due to dehydration, acute renal failure, mucositis and subsequently had febrile neutropenia. We restarted the Xarelto which held due to profound thrombocytopenia.  Today, he is accompanied by his wife and daughter. He reports feeling fatigued and lightheaded when standing. He reports improved appetite on remeron.  He is still having some cough and mucus production and taking Mucinex D helps him.   Overall doing well. No headaches or vison changes. No SOB or hemoptysis. Uses oxygen on prn basis. He is on MS Contin for pain bid and oxycodone for breakthrough pain. Pain  control is adequate. He uses Boost for appetite. Have some gum sensitivity and I told him to use Vit C 500 mg once a day. Also will start taking Iron once a day for anemia. Because of his poor IV access, a port was placed in right internal jugular vein by IR.   CT scan Chest  08/03/2013 showed a positive response to therapy with REGRESSION of the previously noted right hilar mass, and decreased size of mediastinal lymphadenopathy.      MRI BRAIN 08/30/2013 Complete resolution of the tiny areas of enhancement in the right  parietal lobe and right paramedian brainstem. No mass effect or  significant edema. Mild increase in small vessel disease versus post  treatment effect.      CAT scan Chest, abdomen and pelvis done on 10/27/2013 was reviewed.   COMPARISON: 08/01/2013 FINDINGS: CT CHEST FINDINGS Mediastinum/Hilar Regions: Persistent subcarinal soft tissue density measuring 11 mm on image 32 remains stable. No other pathologically enlarged lymph nodes identified in mediastinum or hilar regions. Previously noted mild right hilar lymphadenopathy is no longer seen. Other Thoracic Lymphadenopathy: None. Lungs: Moderate emphysema again demonstrated. Pulmonary nodule in the posterior right lower lobe shows mild decrease in size,currently measuring 10 x 17 mm on image 47 compared to 13 x 19 mm previously. No new or enlarging pulmonary nodules or masses are identified. New patchy airspace opacity is seen in the posterior right lower lobe, likely inflammatory or infectious in etiology. Reticulonodular opacities in the peripheral left lower lobe remains stable. Pleura: No evidence of effusion or mass. Vascular/Cardiac: No thoracic aortic aneurysm or other significant abnormality identified. Musculoskeletal: No suspicious bone lesions identified. Other: None. CT ABDOMEN AND PELVIS FINDINGS Liver: Probable tiny sub-cm hepatic cysts remain stable. No definite liver masses are identified. Gallbladder/Biliary: Unremarkable. Mild dilatation of common bile duct remain stable. Pancreas: No mass, inflammatory changes, or other parenchymal abnormality identified. Spleen: Within normal limits in size and appearance. Adrenal Glands: No mass identified. Kidneys/Urinary Tract: No masses identified.  No evidence of hydronephrosis. Tiny left renal cysts remain stable. Lymph Nodes: No pathologically enlarged lymph nodes identified. Pelvic/Reproductive Organs: No mass or other significant abnormality identified. Bowel/Peritoneum: Large stool burden again demonstrated. No evidence of bowel obstruction or wall thickening. No inflammatory process or abnormal fluid collections identified. Vascular: No evidence of abdominal aortic aneurysm. Musculoskeletal: No suspicious bone lesions identified. Other: None.  IMPRESSION: Decreased size of right lower lobe pulmonary nodule and right hilar lymphadenopathy. Stable mild subcarinal lymphadenopathy. New patchy airspace disease in the posterior right lower lobe, likely inflammatory or infectious in etiology. Continued followup by CT recommended. No evidence of abdominal or pelvic metastatic disease. Large stool burden noted; suggest clinical correlation for possible constipation. Electronically Signed By: Earle Gell M.D. On: 10/27/2013 13:14  He does need a script for Lorazepam and requesting that if we can set up the IV fluids at home through home health services which I think is reasonable. He is still anemic, hypotensive BP 103/84 and mildly tachycardic at heart rate 105.   INTERVAL HISTORY:  Patient has been receiving a liter of fluid every Friday for 4 hours and that gives him the energy and prevent the dehydration. I will continue that weekly fluid resuscitation through end of January 2016. Patient denies any fever chills. Denies any GI symptoms. He is getting a flu and a pneumonia vaccine in the office today. He also got refill for his morphine and oxycodone. 2 weeks ago he pulled his left arm and still l has  some pain there in muscle. He will continue to use a heating pads and if his symptoms do not improve in another 2 weeks we will consider some kind of imaging to that area. Rest of the review systems negative.  Patient had.30 grays in 12 fractions to the  chest and brain completed 05/18/2013. MRI brain done on 12/05/2013 showed complete resolution of previously identified enhancing metastatic deposits. No metastatic deposits are identified. Mild progression of a stuffy and periventricular white matter changes were consistent with treatment effect the     ED Visit 01/06/2014  Patient for approximately 1 month has had a persistent cough which she has taken azithromycin, Levaquin and most recently started back on azithromycin 4 days ago for sputum production. Yesterday he began having more shortness of breath worse at night and with lying down not improved with his home oxygen. Also states his temperature was running about 99 when it normally is 95-96. He also has decreased by mouth intake and states he just doesn't feel hungry. He received IV fluids by home nursing yesterday but because of his worsening shortness of breath came to ED for evaluation. Patient has a history of COPD but does not use inhalers at home. On exam he has diffuse mild rhonchi and crackles but no definitive wheezing. He is not in any acute distress at this time and had received fentanyl and Zofran prior to being evaluated which she said improved his pain and nausea.  Low suspicion for cardiac cause at this time with a negative troponin, unchanged EKG and normal BNP. Concern for possible pneumonia versus COPD exacerbation. CBC and BMP are within normal limits. Chest x-ray shows a right lower lobe process which could be pneumonia versus radiation scarring. Because this is also seen on his September CT scan we'll repeat a CT to ensure no new symptoms. CT shows extensive pneumonitis which is worsening. Will discuss with onc. Pt does not have improvement in sx with albuterol/atrovent.  IMPRESSION ON CT CHEST 01/06/2014:  Increased infiltrative changes in the RIGHT lower lobe and perihilar/paramediastinal regions of the RIGHT lung, suspect related to radiation pneumonitis. Minimal  decrease in size of RIGHT lower lobe mass. Underlying severe emphysematous and bronchitic changes.  No new intrathoracic abnormalities identified.       Patient is currently on prednisone 20 mg daily and symptomatically feeling better. He did not get Advair as it was extensive. He does use Spiriva and also have an albuterol inhaler. I'm going to continue prednisone longer to treat the radiation pneumonitis. Once he completes the 20 mg pills, I ordered another prescription of 10 mg of prednisone which she will continue until his next visit in a month. He is also using home oxygen. He will continue to get hydration every Friday which seems to perk him up. 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  MEDICAL HISTORY: Past Medical History  Diagnosis Date  . Hyperlipidemia   . Hypertension   . GERD (gastroesophageal reflux disease)   . Respiratory failure with hypoxia 04/21/2013    secondary to pneumonia/notes 04/21/2013  . Pulmonary embolism     "got one in there now" (04/21/2013)  . COPD (chronic obstructive pulmonary disease)   . Pneumonia 2/?01/2014    "wouldn't get better; hospitalized 04/21/2013)  . Arthritis     "minor in my right hand" (04/21/2013)  . History of radiation therapy 05/03/13-05/18/13  chest & whole brain  . Cancer     lung ca dx'd 03/2013    INTERIM HISTORY: has GERD (gastroesophageal reflux disease); HCAP (healthcare-associated pneumonia); Respiratory failure with hypoxia; HTN (hypertension); Acute pulmonary embolism; Lung mass; Emphysema lung; Small cell carcinoma of lung; Acute renal failure; Protein-calorie malnutrition, severe; and Anorexia on his problem list.    ALLERGIES:  is allergic to decadron and augmentin.  MEDICATIONS: has a current medication list which includes the following prescription(s): albuterol, docusate sodium,  fluticasone-salmeterol, iron-vitamins, levofloxacin, lidocaine-prilocaine, lorazepam, mirtazapine, mirtazapine, morphine, nystatin, omeprazole, omeprazole-sodium bicarbonate, ondansetron, oxycodone, polyethylene glycol powder, prednisone, prochlorperazine, rivaroxaban, tiotropium, and vitamin b-12, and the following Facility-Administered Medications: sodium chloride and sodium chloride.  SURGICAL HISTORY:  Past Surgical History  Procedure Laterality Date  . Inguinal hernia repair Right ~ 2005  . Finger surgery Left ~ 1989    "crushed end of my middle finger off"   . Video bronchoscopy Bilateral 04/26/2013    Procedure: VIDEO BRONCHOSCOPY WITH FLUORO;  Surgeon: Wilhelmina Mcardle, MD;  Location: [facility] ENDOSCOPY;  Service: Cardiopulmonary;  Laterality: Bilateral;    REVIEW OF SYSTEMS:   Constitutional: Denies fevers, chills or abnormal weight loss,+fatigue Eyes: Denies blurriness of vision Ears, nose, mouth, throat, and face: Denies mucositis or sore throat, +vertigo and ear clogging sensation Respiratory: + cough, +dyspnea no wheezes Cardiovascular: Denies palpitation, chest discomfort or lower extremity swelling Gastrointestinal:  Denies nausea, heartburn or change in bowel habits Skin: Denies abnormal skin rashes Lymphatics: Denies new lymphadenopathy or easy bruising Neurological:Denies numbness, tingling or new weaknesses Behavioral/Psych: Mood is stable, no new changes  All other systems were reviewed with the patient and are negative.  PHYSICAL EXAMINATION: ECOG PERFORMANCE STATUS: 1-2 KPS 80  Blood pressure 115/79, pulse 74, temperature 0 F (-17.8 C), resp. rate 17, height 5\' 7"  (1.702 m), weight 94 lb 9.6 oz (42.91 kg), SpO2 98 %.  GENERAL:alert, no distress and comfortable; HOH; chronically ill appearing, alopecia.  SKIN: skin color, texture, turgor are normal, no rashes or significant lesions EYES: normal, Conjunctiva are pink and non-injected, sclera clear EARS: Bilateral cerumen  impaction noted. OROPHARYNX:no exudate, no erythema and lips, buccal mucosa, and tongue normal  NECK: supple, thyroid normal size, non-tender, without nodularity LYMPH:  no palpable lymphadenopathy in the cervical, axillary or supraclavicular LUNGS: clear to auscultation with normal breathing effort, no wheezes or rhonchi HEART: regular rate & rhythm and no murmurs and no lower extremity edema ABDOMEN:abdomen soft, non-tender and normal bowel sounds Musculoskeletal:no cyanosis of digits and no clubbing  NEURO: alert & oriented x 3 with fluent speech, no focal motor/sensory deficits; Cranial nerves II - XII intact.  Negative rhomberge.  Strength 5/5 in upper and lower extremities bilaterally.  Gait, normal.   LABS:           SCANS REVIEWED.  ASSESSMENT: Ricky Villanueva 63 y.o. male with a history of SCLC right lung with evidence of brain metastasis making it an Extensive stage which is not curable and treatment goal is "Palliative". He was recently evaluated in the ED on 01/23/2014 with shortness of breath and a CT scan showed Radiation Pneumonitis as seen above. Is currently being treated with the oral prednisone and seems to be responding well clinically. In terms of his the restaging, his next PET scan and MRI was ordered for 03/13/2014 and we will keep his appointments. I will recommend seeing him again in a month that is 02/07/2014 to review his labs and his symptoms. Once he completes the 20  mg tablets of prednisone, he will continue 10 mg prednisone daily until we see him back in a month.   PLAN:  1. 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 Pablo Ledger completed whole brain XRT on 03/25. We started chemotherapy on 03/25 and he completed Cycle #1 On cisplatin 80 mg/m2 on day 1, etoposide 100 mg/m2 days 1,2,3. He had toxicity related to diminished hearing and hospitalization.  He has been evaluated by Dr. Constance Holster who also recommends hearing aides.     --Given his complains of hearing fullness and intermittent tinnitus, we discontinued cisplatin and substituted it with carboplatin AUC 3. In addition, we will decrease etoposide dose by 25% due to profound neutropenia. We will also continue neulasta of Day#4 of his chemotherapy each cycle due to febrile neutropenia. He received chemotherapy on 7/15 for his Cycle #4, day #1 of Carboplatin plus etoposide.  He completed last cycle  As listed above on 10/14/13. NCCN guidelines support 4-6 cycles of induction therapy followed by observation.   --We will repeat another scan in January 2016. He will be seen back in 4 weeks by Dr Burr Medico. I explained to the patient and his family that the nature of this cancer is such that it usually relapses or recurs. We can hope for the best. His MRI of the brain on 12/05/2013 shows CR in brain.  2. Gastritis/esophagitis.  --Continue zegerid OTC.    Seen by GI with a normal barium swallow.   3. Anorexia.  --Likely secondary to #1. He reports not tolerating marinol so he was prescribed Remeron 30 mg qhs instead. His appetite is improving on remeron.   4. PE (04/21/2013).  --CTA of chest noted above consistent with Nonocclusive left upper lobe, right lower lobe pulmonary emboli. Right heart strain. Continue xarelto 20 mg daily.   5. Anemia secondary to #1 plus/minus chemotherapy, symptomatic with fatigue and lightheadedness when standing.  --He is asymptomatic.  His hemoglobin is better.[name] He is on Iron and Vitamin C. Will also take oral B 12 now  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 --We DISCONTINUED cisplatin and now patient is on carboplatin.  He has been fitted and received his hearing aids.   7. Dehydration and hypotension.  --We will continue Normal saline q weekly (Fridays) and will plan to schedule it through home health services till end of January 2016.   8. Emphysema  --Continue spiriva hanihaler 18 mcg inhalation daily. Oxygen 2 liters by [place] prn for  moderate exertion.   9. Wax Impaction in Ears --Daughter to call PCP to make an appointment for flushing his ears.  10. Follow-up. RTC in 4 weeks with labs. He did get a flu and pneumonia shot today in office on 12/14/2013. I wished him good luck in the future. Patient and his family were upset as I'm about to leave but I assured them that Dr.Feng will provide excellent care to them. The treatment goal remains palliative based on the stage of his cancer which was extensive stage small cell lung cancer.    All questions were answered. The patient knows to call the clinic with any problems, questions or concerns. We can certainly see the patient much sooner if necessary.  I spent 15 minutes counseling the patient face to face. The total time spent in the appointment was 30 minutes.    Bernadene Bell, MD Medical Hematologist/Oncologist [facility] Pager: [phone] Office No: [phone]

## 2014-01-10 NOTE — Telephone Encounter (Signed)
Pt given appt tomorrow @ 3:15 with MMM

## 2014-01-10 NOTE — Telephone Encounter (Signed)
lvm for pt regarding to Dec appts...mailed pt appt sched/avs and letter

## 2014-01-11 ENCOUNTER — Encounter: Payer: Self-pay | Admitting: Nurse Practitioner

## 2014-01-11 ENCOUNTER — Ambulatory Visit (INDEPENDENT_AMBULATORY_CARE_PROVIDER_SITE_OTHER): Payer: BC Managed Care – PPO | Admitting: Nurse Practitioner

## 2014-01-11 VITALS — BP 109/79 | HR 80 | Temp 97.5°F | Ht 67.0 in | Wt 97.8 lb

## 2014-01-11 DIAGNOSIS — H8113 Benign paroxysmal vertigo, bilateral: Secondary | ICD-10-CM

## 2014-01-11 DIAGNOSIS — H6123 Impacted cerumen, bilateral: Secondary | ICD-10-CM

## 2014-01-11 MED ORDER — MECLIZINE HCL 25 MG PO TABS: 25.0000 mg | ORAL_TABLET | Freq: Three times a day (TID) | ORAL | Status: DC | PRN

## 2014-01-11 NOTE — Patient Instructions (Signed)
Vertigo Vertigo means you feel like you or your surroundings are moving when they are not. Vertigo can be dangerous if it occurs when you are at work, driving, or performing difficult activities.  CAUSES  Vertigo occurs when there is a conflict of signals sent to your brain from the visual and sensory systems in your body. There are many different causes of vertigo, including:  Infections, especially in the inner ear.  A bad reaction to a drug or misuse of alcohol and medicines.  Withdrawal from drugs or alcohol.  Rapidly changing positions, such as lying down or rolling over in bed.  A migraine headache.  Decreased blood flow to the brain.  Increased pressure in the brain from a head injury, infection, tumor, or bleeding. SYMPTOMS  You may feel as though the world is spinning around or you are falling to the ground. Because your balance is upset, vertigo can cause nausea and vomiting. You may have involuntary eye movements (nystagmus). DIAGNOSIS  Vertigo is usually diagnosed by physical exam. If the cause of your vertigo is unknown, your caregiver may perform imaging tests, such as an MRI scan (magnetic resonance imaging). TREATMENT  Most cases of vertigo resolve on their own, without treatment. Depending on the cause, your caregiver may prescribe certain medicines. If your vertigo is related to body position issues, your caregiver may recommend movements or procedures to correct the problem. In rare cases, if your vertigo is caused by certain inner ear problems, you may need surgery. HOME CARE INSTRUCTIONS   Follow your caregiver's instructions.  Avoid driving.  Avoid operating heavy machinery.  Avoid performing any tasks that would be dangerous to you or others during a vertigo episode.  Tell your caregiver if you notice that certain medicines seem to be causing your vertigo. Some of the medicines used to treat vertigo episodes can actually make them worse in some people. SEEK  IMMEDIATE MEDICAL CARE IF:   Your medicines do not relieve your vertigo or are making it worse.  You develop problems with talking, walking, weakness, or using your arms, hands, or legs.  You develop severe headaches.  Your nausea or vomiting continues or gets worse.  You develop visual changes.  A family member notices behavioral changes.  Your condition gets worse. MAKE SURE YOU:  Understand these instructions.  Will watch your condition.  Will get help right away if you are not doing well or get worse. Document Released: 11/20/2004 Document Revised: 05/05/2011 Document Reviewed: 08/29/2010 [facility] Patient Information 2015 [place], [place]. This information is not intended to replace advice given to you by your health care provider. Make sure you discuss any questions you have with your health care provider. Cerumen Impaction A cerumen impaction is when the wax in your ear forms a plug. This plug usually causes reduced hearing. Sometimes it also causes an earache or dizziness. Removing a cerumen impaction can be difficult and painful. The wax sticks to the ear canal. The canal is sensitive and bleeds easily. If you try to remove a heavy wax buildup with a cotton tipped swab, you may push it in further. Irrigation with water, suction, and small ear curettes may be used to clear out the wax. If the impaction is fixed to the skin in the ear canal, ear drops may be needed for a few days to loosen the wax. People who build up a lot of wax frequently can use ear wax removal products available in your local drugstore. SEEK MEDICAL CARE IF:  You  develop an earache, increased hearing loss, or marked dizziness. Document Released: 03/20/2004 Document Revised: 05/05/2011 Document Reviewed: 05/10/2009 [facility] Patient Information 2015 [place], [place]. This information is not intended to replace advice given to you by your health care provider. Make sure you discuss any questions you have with your  health care provider.

## 2014-01-11 NOTE — Progress Notes (Signed)
   Subjective:    Patient ID: Ricky Villanueva, male    DOB: Jul 10, 1950, 63 y.o.   MRN: [id number]  HPI  Patient in c/o both ears stopped up- hearing decreased. Denies pain.   Review of Systems  Constitutional: Negative.   HENT: Negative.   Respiratory: Negative.   Cardiovascular: Negative.   Genitourinary: Negative.   Neurological: Negative.   Psychiatric/Behavioral: Negative.   All other systems reviewed and are negative.      Objective:   Physical Exam  Constitutional: He is oriented to person, place, and time. He appears well-developed and well-nourished.  HENT:  Right Ear: Tympanic membrane normal.  Left Ear: Tympanic membrane normal.  Bilateral cerumen impaction  Eyes: Pupils are equal, round, and reactive to light.  Neck: Normal range of motion.  Cardiovascular: Normal rate, regular rhythm and normal heart sounds.   Pulmonary/Chest: Effort normal and breath sounds normal.  Neurological: He is alert and oriented to person, place, and time.  Skin: Skin is warm.  Psychiatric: He has a normal mood and affect. His behavior is normal. Judgment and thought content normal.   BP 109/79 mmHg  Pulse 80  Temp(Src) 97.5 F (36.4 C) (Oral)  Ht 5\' 7"  (1.702 m)  Wt 97 lb 12.8 oz (44.362 kg)  BMI 15.31 kg/m2  currette and lavage used to clean out both ears      Assessment & Plan:  1. Cerumen impaction, bilateral Debrox OTC daily  2. Benign paroxysmal positional vertigo, bilateral Force fluids rto prn - meclizine (ANTIVERT) 25 MG tablet; Take 1 tablet (25 mg total) by mouth 3 (three) times daily as needed for dizziness.  Dispense: 30 tablet; Refill: [place], FNP

## 2014-01-13 ENCOUNTER — Other Ambulatory Visit: Payer: Self-pay | Admitting: *Deleted

## 2014-01-13 DIAGNOSIS — C3490 Malignant neoplasm of unspecified part of unspecified bronchus or lung: Secondary | ICD-10-CM

## 2014-01-13 DIAGNOSIS — M545 Low back pain, unspecified: Secondary | ICD-10-CM

## 2014-01-13 DIAGNOSIS — K1230 Oral mucositis (ulcerative), unspecified: Secondary | ICD-10-CM

## 2014-01-13 DIAGNOSIS — R918 Other nonspecific abnormal finding of lung field: Secondary | ICD-10-CM

## 2014-01-13 MED ORDER — MORPHINE SULFATE ER 30 MG PO TBCR: 30.0000 mg | EXTENDED_RELEASE_TABLET | Freq: Two times a day (BID) | ORAL | Status: DC

## 2014-01-16 ENCOUNTER — Other Ambulatory Visit: Payer: Self-pay

## 2014-01-16 DIAGNOSIS — C3410 Malignant neoplasm of upper lobe, unspecified bronchus or lung: Secondary | ICD-10-CM

## 2014-01-16 MED ORDER — PREDNISONE 20 MG PO TABS: 10.0000 mg | ORAL_TABLET | Freq: Every day | ORAL | Status: DC

## 2014-01-16 NOTE — Telephone Encounter (Signed)
Pt has one more 20 mg prednisone left for tomorrow, what is plan? Read note and s/w dr Lona Kettle, will decrease prednisone to 10 mg daily. rx escribed to pharmacy. Bonnie informed. Called bonnie and informed her the pill will be 20 mg and to take 1/2 tab.

## 2014-01-23 ENCOUNTER — Telehealth: Payer: Self-pay | Admitting: *Deleted

## 2014-01-23 NOTE — Telephone Encounter (Signed)
Spouse called asking if it "is okay for Mucinex DM for his congested cough".  Denies fever.  "Temp = 98.8.  Cough is yellow-green but this is not new. He is taking prednisone as ordered in ER a few weeks ago but he is still congested."  Informed her Mucinex DM is for cough and congestion.  His symptoms are common post radiation.  Mucinex DM will not interfere with current meds but if he has a fever or not improving he may need f/u.  Reports she will try Mucinex DM and call if needed.  Next F/u is 12- 15-2015 and is re-assigned to Dr. Burr Medico.

## 2014-01-26 ENCOUNTER — Other Ambulatory Visit: Payer: Self-pay | Admitting: Hematology

## 2014-01-26 NOTE — Telephone Encounter (Signed)
Received call from pt's wife asking for refill on lorazepam to be called to [facility].

## 2014-02-01 ENCOUNTER — Telehealth: Payer: Self-pay | Admitting: *Deleted

## 2014-02-01 NOTE — Telephone Encounter (Signed)
Called & left message for daughter that antibiotic OK to take for abscessed tooth.

## 2014-02-01 NOTE — Telephone Encounter (Signed)
Received call from daughter Clover Mealy wanting to inform Dr. Burr Medico re:  Pt saw his dentist yesterday for an abscessed tooth.  Pt was given Penicillin and Hydrocodone.  [name] wanted to know if it is ok with md for pt to take this antibiotic. [name]'s  Phone     [phone].

## 2014-02-03 ENCOUNTER — Telehealth: Payer: Self-pay | Admitting: *Deleted

## 2014-02-03 NOTE — Telephone Encounter (Signed)
Patient sees Ricky Villanueva for the first time on 02/07/14.  Dtr. Ricky Villanueva is calling.  Patient saw the dentist on 01/31/14 for an extraction.  He will have the stitches out on 02/08/14. The dentist told him that he had 3-5 other teeth with cavities. He now has another tooth that is feeling uncomfortable. The daughter would like the patient to see the dentist that we have here ASAP.

## 2014-02-05 ENCOUNTER — Other Ambulatory Visit: Payer: Self-pay | Admitting: Internal Medicine

## 2014-02-07 ENCOUNTER — Encounter: Payer: Self-pay | Admitting: Hematology

## 2014-02-07 ENCOUNTER — Other Ambulatory Visit: Payer: Self-pay | Admitting: Hematology

## 2014-02-07 ENCOUNTER — Ambulatory Visit (HOSPITAL_BASED_OUTPATIENT_CLINIC_OR_DEPARTMENT_OTHER): Payer: BC Managed Care – PPO | Admitting: Hematology

## 2014-02-07 ENCOUNTER — Other Ambulatory Visit (HOSPITAL_BASED_OUTPATIENT_CLINIC_OR_DEPARTMENT_OTHER): Payer: BC Managed Care – PPO

## 2014-02-07 ENCOUNTER — Other Ambulatory Visit: Payer: Self-pay | Admitting: Nurse Practitioner

## 2014-02-07 ENCOUNTER — Telehealth: Payer: Self-pay | Admitting: Hematology

## 2014-02-07 VITALS — BP 111/79 | HR 69 | Temp 97.8°F | Resp 18 | Ht 67.0 in | Wt 103.8 lb

## 2014-02-07 DIAGNOSIS — J700 Acute pulmonary manifestations due to radiation: Secondary | ICD-10-CM

## 2014-02-07 DIAGNOSIS — M545 Low back pain, unspecified: Secondary | ICD-10-CM

## 2014-02-07 DIAGNOSIS — C3490 Malignant neoplasm of unspecified part of unspecified bronchus or lung: Secondary | ICD-10-CM

## 2014-02-07 DIAGNOSIS — R918 Other nonspecific abnormal finding of lung field: Secondary | ICD-10-CM

## 2014-02-07 DIAGNOSIS — C7B8 Other secondary neuroendocrine tumors: Secondary | ICD-10-CM

## 2014-02-07 DIAGNOSIS — C7A1 Malignant poorly differentiated neuroendocrine tumors: Secondary | ICD-10-CM

## 2014-02-07 DIAGNOSIS — K1230 Oral mucositis (ulcerative), unspecified: Secondary | ICD-10-CM

## 2014-02-07 DIAGNOSIS — J439 Emphysema, unspecified: Secondary | ICD-10-CM

## 2014-02-07 LAB — CBC WITH DIFFERENTIAL/PLATELET
BASO%: 0.6 % (ref 0.0–2.0)
Basophils Absolute: 0.1 10*3/uL (ref 0.0–0.1)
EOS%: 0.7 % (ref 0.0–7.0)
Eosinophils Absolute: 0.1 10*3/uL (ref 0.0–0.5)
HCT: 34.7 % — ABNORMAL LOW (ref 38.4–49.9)
HGB: 11.3 g/dL — ABNORMAL LOW (ref 13.0–17.1)
LYMPH%: 5.3 % — ABNORMAL LOW (ref 14.0–49.0)
MCH: 33.4 pg (ref 27.2–33.4)
MCHC: 32.7 g/dL (ref 32.0–36.0)
MCV: 102.1 fL — AB (ref 79.3–98.0)
MONO#: 0.6 10*3/uL (ref 0.1–0.9)
MONO%: 7 % (ref 0.0–14.0)
NEUT#: 7.8 10*3/uL — ABNORMAL HIGH (ref 1.5–6.5)
NEUT%: 86.4 % — ABNORMAL HIGH (ref 39.0–75.0)
Platelets: 255 10*3/uL (ref 140–400)
RBC: 3.4 10*6/uL — AB (ref 4.20–5.82)
RDW: 13.6 % (ref 11.0–14.6)
WBC: 9 10*3/uL (ref 4.0–10.3)
lymph#: 0.5 10*3/uL — ABNORMAL LOW (ref 0.9–3.3)

## 2014-02-07 LAB — COMPREHENSIVE METABOLIC PANEL (CC13)
ALK PHOS: 60 U/L (ref 40–150)
ALT: 17 U/L (ref 0–55)
AST: 14 U/L (ref 5–34)
Albumin: 3.1 g/dL — ABNORMAL LOW (ref 3.5–5.0)
Anion Gap: 8 mEq/L (ref 3–11)
BUN: 11 mg/dL (ref 7.0–26.0)
CO2: 28 mEq/L (ref 22–29)
Calcium: 8.7 mg/dL (ref 8.4–10.4)
Chloride: 103 mEq/L (ref 98–109)
Creatinine: 0.8 mg/dL (ref 0.7–1.3)
EGFR: >90 mL/min/{1.73_m2} (ref >90)
Glucose: 137 mg/dl (ref 70–140)
Potassium: 4.2 mEq/L (ref 3.5–5.1)
SODIUM: 139 meq/L (ref 136–145)
TOTAL PROTEIN: 5.9 g/dL — AB (ref 6.4–8.3)

## 2014-02-07 MED ORDER — OXYCODONE HCL 5 MG PO TABS: 5.0000 mg | ORAL_TABLET | ORAL | Status: DC | PRN

## 2014-02-07 MED ORDER — PREDNISONE 10 MG PO TABS: 10.0000 mg | ORAL_TABLET | Freq: Every day | ORAL | Status: DC

## 2014-02-07 MED ORDER — MORPHINE SULFATE ER 30 MG PO TBCR: 30.0000 mg | EXTENDED_RELEASE_TABLET | Freq: Two times a day (BID) | ORAL | Status: DC

## 2014-02-07 MED ORDER — RIVAROXABAN 20 MG PO TABS: 20.0000 mg | ORAL_TABLET | Freq: Every day | ORAL | Status: DC

## 2014-02-07 MED ORDER — RIVAROXABAN 20 MG PO TABS
20.0000 mg | ORAL_TABLET | Freq: Every day | ORAL | Status: DC
Start: 2014-02-07 — End: 2014-04-10

## 2014-02-07 NOTE — Telephone Encounter (Signed)
LM to confirm appt for Jan. Mailed cal & Escript letter pt left in office.

## 2014-02-07 NOTE — Progress Notes (Signed)
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  PREVIOUS TREATMENT: 1. First-line chemotherapy with 5 cycles of carboplatin (cisplatin for the first cycle) and etoposide from 05/18/2013 to 10/15/2013.  2.  WB XRT and palliative chest radiation given on 05/03/13 - 05/18/13 per Dr. Pablo Ledger.   OTHER PROBLEMS 1. 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  2. COPD, on home oxygen 2 L/min most of time.  INTERVAL HISTORY: [name] returns for follow-up. He was under Dr. San Jetty care previously, who has left the practice.  He has ben on prednisone since 01/02/2014. His dyspnea has been much better since then, he was stareted on 20mg  daily for 2 weeks then 10mg  since then. His fatigue and anorexia has also been greatly improved since he started prednisone. His body pain is well controlled with MS Contin and oxycodone as needed (he takes about 2 tablets a day). He uses oxygen most of time, he functions better at home, he is able to do all ADLs and some light house work around his house. No fever chill cough or hemoptysis.  REVIEW OF SYSTEMS:   Constitutional: Denies fevers, chills or abnormal weight loss. He gained about 10 pounds in the past few months. Eyes: Denies blurriness of vision Ears, nose, mouth, throat, and face: Denies mucositis or sore throat Respiratory: Denies cough, (+)dyspnea, no wheezes Cardiovascular: Denies palpitation, chest discomfort or lower extremity swelling Gastrointestinal:  Denies nausea, heartburn or change in bowel habits Skin: Denies abnormal skin rashes Lymphatics: Denies new lymphadenopathy or easy bruising Neurological:Denies numbness, tingling or new weaknesses Behavioral/Psych: Mood is stable, no new changes  All  other systems were reviewed with the patient and are negative.  MEDICAL HISTORY:  Past Medical History  Diagnosis Date  . Hyperlipidemia   . Hypertension   . GERD (gastroesophageal reflux disease)   . Respiratory failure with hypoxia 04/21/2013    secondary to pneumonia/notes 04/21/2013  . Pulmonary embolism     "got one in there now" (04/21/2013)  . COPD (chronic obstructive pulmonary disease)   . Pneumonia 2/?01/2014    "wouldn't get better; hospitalized 04/21/2013)  . Arthritis     "minor in my right hand" (04/21/2013)  . History of radiation therapy 05/03/13-05/18/13    chest & whole brain  . Cancer     lung ca dx'd 03/2013    SURGICAL HISTORY: Past Surgical History  Procedure Laterality Date  . Inguinal hernia repair Right ~ 2005  . Finger surgery Left ~ 1989    "crushed end of my middle finger off"   . Video bronchoscopy Bilateral 04/26/2013    Procedure: VIDEO BRONCHOSCOPY WITH FLUORO;  Surgeon: Wilhelmina Mcardle, MD;  Location: [facility] ENDOSCOPY;  Service: Cardiopulmonary;  Laterality: Bilateral;    I have reviewed the social history and family history with the patient and they are unchanged from previous note.  ALLERGIES:  is allergic to decadron and augmentin.  MEDICATIONS:  Current Outpatient Prescriptions  Medication Sig Dispense Refill  . albuterol (PROVENTIL HFA;VENTOLIN HFA) 108 (90 BASE) MCG/ACT inhaler Inhale 2 puffs into the lungs every 6 (six) hours as needed for wheezing or shortness of breath. 1 Inhaler 2  . docusate sodium (COLACE) 100 MG capsule Take 100 mg by mouth 2 (two) times daily.    . Iron-Vitamins (GERITOL  PO) Take 1 tablet by mouth daily.    [name] lidocaine-prilocaine (EMLA) cream Apply to port site one hour before treatment and cover with plastic wrap. 1 each 2  . LORazepam (ATIVAN) 0.5 MG tablet TAKE ONE TABLET BY MOUTH TWICE DAILY AS NEEDED FOR ANXIETY 60 tablet 0  . meclizine (ANTIVERT) 25 MG tablet Take 1 tablet (25 mg total) by mouth 3 (three) times daily  as needed for dizziness. 30 tablet 1  . mirtazapine (REMERON) 30 MG tablet TAKE ONE TABLET BY MOUTH AT BEDTIME 30 tablet 0  . omeprazole (PRILOSEC) 40 MG capsule Take 40 mg by mouth daily.     . ondansetron (ZOFRAN) 8 MG tablet Take 8 mg by mouth every 8 (eight) hours as needed for nausea or vomiting (nausea).     . polyethylene glycol powder (MIRALAX) powder Take 17 g by mouth daily. 255 g 0  . predniSONE (DELTASONE) 20 MG tablet Take 0.5 tablets (10 mg total) by mouth daily. 30 tablet 5  . tiotropium (SPIRIVA HANDIHALER) 18 MCG inhalation capsule Place 1 capsule (18 mcg total) into inhaler and inhale daily. 90 capsule 1  . vitamin B-12 (CYANOCOBALAMIN) 100 MCG tablet Take 100 mcg by mouth daily.    [name] morphine (MS CONTIN) 30 MG 12 hr tablet Take 1 tablet (30 mg total) by mouth every 12 (twelve) hours. 60 tablet 0  . nystatin (MYCOSTATIN) 100000 UNIT/ML suspension Use as directed 5 mLs in the mouth or throat 4 (four) times daily as needed (after chemo).     [name] oxyCODONE (OXY IR/ROXICODONE) 5 MG immediate release tablet Take 1 tablet (5 mg total) by mouth every 4 (four) hours as needed for severe pain. 60 tablet 0  . prochlorperazine (COMPAZINE) 10 MG tablet Take 10 mg by mouth every 6 (six) hours as needed for nausea or vomiting (nausea).     Alveda Reasons 20 MG TABS tablet TAKE ONE TABLET BY MOUTH ONE TIME DAILY WITH SUPPER 30 tablet 1   No current facility-administered medications for this visit.   Facility-Administered Medications Ordered in Other Visits  Medication Dose Route Frequency Provider Last Rate Last Dose  . 0.9 %  sodium chloride infusion  1,000 mL Intravenous Once Concha Norway, MD      . 0.9 %  sodium chloride infusion   Intravenous Once Concha Norway, MD        PHYSICAL EXAMINATION: ECOG PERFORMANCE STATUS: 1 - Symptomatic but completely ambulatory  Filed Vitals:   02/07/14 1322  BP: 111/79  Pulse: 69  Temp: 97.8 F (36.6 C)  Resp: 18   Filed Weights   02/07/14 1322    Weight: 103 lb 12.8 oz (47.083 kg)    GENERAL: A thin Caucasian male, alert, no distress and comfortable, on nasal cannula oxygen.  SKIN: skin color, texture, turgor are normal, no rashes or significant lesions EYES: normal, Conjunctiva are pink and non-injected, sclera clear OROPHARYNX:no exudate, no erythema and lips, buccal mucosa, and tongue normal  NECK: supple, thyroid normal size, non-tender, without nodularity LYMPH:  no palpable lymphadenopathy in the cervical, axillary or inguinal LUNGS: clear to auscultation and percussion with normal breathing effort HEART: regular rate & rhythm and no murmurs and no lower extremity edema ABDOMEN:abdomen soft, non-tender and normal bowel sounds Musculoskeletal:no cyanosis of digits and no clubbing  NEURO: alert & oriented x 3 with fluent speech, no focal motor/sensory deficits  LABORATORY DATA:  I have reviewed the data as listed CBC Latest Ref Rng 02/07/2014 01/06/2014 12/14/2013  WBC 4.0 - 10.3 10e3/uL 9.0 5.6 6.1  Hemoglobin 13.0 - 17.1 g/dL 11.3(L) 11.8(L) 11.7(L)  Hematocrit 38.4 - 49.9 % 34.7(L) 34.7(L) 35.4(L)  Platelets 140 - 400 10e3/uL 255 220 180     CMP Latest Ref Rng 01/06/2014 12/14/2013 11/02/2013  Glucose 70 - 99 mg/dL 123(H) 148(H) 115  BUN 6 - 23 mg/dL 10 10.5 10.1  Creatinine 0.50 - 1.35 mg/dL 0.73 0.8 0.9  Sodium 137 - 147 mEq/L 139 140 141  Potassium 3.7 - 5.3 mEq/L 4.3 4.1 3.9  Chloride 96 - 112 mEq/L 100 - -  CO2 19 - 32 mEq/L 25 29 28   Calcium 8.4 - 10.5 mg/dL 9.7 9.7 9.3  Total Protein 6.4 - 8.3 g/dL - 6.5 6.6  Total Bilirubin 0.20 - 1.20 mg/dL - 0.46 0.30  Alkaline Phos 40 - 150 U/L - 72 105  AST 5 - 34 U/L - 14 16  ALT 0 - 55 U/L - 13 13      RADIOGRAPHIC STUDIES: I have personally reviewed the radiological images as listed and agreed with the findings in the report.   CT chest with contrast on 01/06/2014 IMPRESSION: Increased infiltrative changes in the RIGHT lower lobe  and perihilar/paramediastinal regions of the RIGHT lung, suspect related to radiation pneumonitis. Bb  Minimal decrease in size of RIGHT lower lobe mass.  Underlying severe emphysematous and bronchitic changes.  No new intrathoracic abnormalities identified.   ASSESSMENT & PLAN:   63 year old male with history of heavy smoking and COPD, was found to have metastatic small cell lung cancer to brain.   1. 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 -He is status post first line chemotherapy and whole brain radiation. Currently on chemotherapy break.  -He is scheduled to have a restaging PET/CT scan on 03/13/2014.  -I'll see him back after his restaging PET/CT scan.   2. Radiation related pneumonitis -His symptom has much improved with low-dose prednisone. -I'll continue prednisone at 10 mg once daily for 1 more months and tapered off in a few weeks. I called in the refilled today. -He is on PPI when he is taking prednisone.  3. Anorexia  -March Improved since he has been off chemotherapy and taking steroids.  4. COPD -Continue current metastases and oxygen as needed.  Plan: 1. Continue prednisone at 10mg  for now, refilled today 2. You have a PET scan scheduled for 1/18, I will see you back on 1/21 3. Dental referral for tooth infection  4. I refilled you pain meds and Xarelto today   All questions were answered. The patient knows to call the clinic with any problems, questions or concerns. No barriers to learning was detected. I spent 30 minutes counseling the patient face to face. The total time spent in the appointment was 40 minutes and more than 50% was on counseling and review of test results     Truitt Merle, MD 02/07/2014 1:49 PM

## 2014-02-10 ENCOUNTER — Other Ambulatory Visit: Payer: Self-pay | Admitting: Nurse Practitioner

## 2014-02-28 ENCOUNTER — Other Ambulatory Visit: Payer: Self-pay | Admitting: Hematology

## 2014-02-28 ENCOUNTER — Telehealth: Payer: Self-pay | Admitting: *Deleted

## 2014-02-28 NOTE — Telephone Encounter (Signed)
Received call from daughter Clover Mealy wanted to know about referral to dentist at [facility].  Was informed by Lenna Sciara, scheduler that Dr. Enrique Sack does not do general dentistry.  MD was specialized in cardiac, Head and Neck, and will evaluate cancer pts prior to starting treatments.  Dr. Burr Medico notified. Spoke with [name] and informed her of above info.  Instructed [name] that pt will need to follow up with his regular dentist for tooth extraction.  [name] voiced understanding.

## 2014-03-01 ENCOUNTER — Telehealth: Payer: Self-pay | Admitting: *Deleted

## 2014-03-01 NOTE — Telephone Encounter (Signed)
Reached pt's wife today & questioned about letter written 01/13/14 regarding no coverage for xarelto.  She reports that she received another letter in Dec from [facility] stating that this letter was sent in error & pt will cont to have coverage with no copay fo xarelto.  Pt should have script for this drug at [facility] she will call if there are any questions.

## 2014-03-01 NOTE — Telephone Encounter (Signed)
Message left at home # to call back to discuss letter received from [place] regarding xarelto coverage.

## 2014-03-02 ENCOUNTER — Telehealth: Payer: Self-pay | Admitting: *Deleted

## 2014-03-02 MED ORDER — LORAZEPAM 0.5 MG PO TABS: 0.5000 mg | ORAL_TABLET | Freq: Two times a day (BID) | ORAL | Status: DC | PRN

## 2014-03-02 NOTE — Telephone Encounter (Signed)
Received call from pt's wife stating that [facility] pharmacy has been requesting refill for ativan but hasn't heard from [place].  Will refill ativan today.

## 2014-03-02 NOTE — Telephone Encounter (Signed)
Received call from [facility] RN/Tina asking if IVF are to be cont on this pt.  Note to Dr Burr Medico.

## 2014-03-03 NOTE — Telephone Encounter (Signed)
Called [facility] RN & informed per Dr. Burr Medico, [place] to cont 1 L LR weekly for now & Dr Burr Medico will evaluate when she sees the pt.

## 2014-03-13 ENCOUNTER — Other Ambulatory Visit: Payer: Self-pay | Admitting: Radiation Oncology

## 2014-03-13 ENCOUNTER — Ambulatory Visit (HOSPITAL_COMMUNITY): Payer: BC Managed Care – PPO

## 2014-03-13 ENCOUNTER — Telehealth: Payer: Self-pay | Admitting: *Deleted

## 2014-03-13 ENCOUNTER — Encounter (HOSPITAL_COMMUNITY)
Admission: RE | Admit: 2014-03-13 | Discharge: 2014-03-13 | Disposition: A | Discharge status: Home / Self Care | Payer: BLUE CROSS/BLUE SHIELD | Source: Ambulatory Visit | Attending: Hematology | Admitting: Hematology

## 2014-03-13 ENCOUNTER — Ambulatory Visit (HOSPITAL_COMMUNITY)
Admission: RE | Admit: 2014-03-13 | Discharge: 2014-03-13 | Disposition: A | Discharge status: Home / Self Care | Payer: BLUE CROSS/BLUE SHIELD | Source: Ambulatory Visit | Attending: Radiation Oncology | Admitting: Radiation Oncology

## 2014-03-13 DIAGNOSIS — C3490 Malignant neoplasm of unspecified part of unspecified bronchus or lung: Secondary | ICD-10-CM

## 2014-03-13 DIAGNOSIS — M545 Low back pain, unspecified: Secondary | ICD-10-CM

## 2014-03-13 DIAGNOSIS — R918 Other nonspecific abnormal finding of lung field: Secondary | ICD-10-CM

## 2014-03-13 DIAGNOSIS — C3491 Malignant neoplasm of unspecified part of right bronchus or lung: Secondary | ICD-10-CM | POA: Diagnosis present

## 2014-03-13 DIAGNOSIS — K1230 Oral mucositis (ulcerative), unspecified: Secondary | ICD-10-CM

## 2014-03-13 DIAGNOSIS — Z923 Personal history of irradiation: Secondary | ICD-10-CM | POA: Diagnosis not present

## 2014-03-13 LAB — GLUCOSE, CAPILLARY: Glucose-Capillary: 110 mg/dL — ABNORMAL HIGH (ref 70–99)

## 2014-03-13 MED ORDER — GADOBENATE DIMEGLUMINE 529 MG/ML IV SOLN
10.0000 mL | Freq: Once | INTRAVENOUS | Status: AC | PRN
  Administered 2014-03-13: 10 mL via INTRAVENOUS

## 2014-03-13 MED ORDER — MORPHINE SULFATE ER 30 MG PO TBCR: 30.0000 mg | EXTENDED_RELEASE_TABLET | Freq: Two times a day (BID) | ORAL | Status: DC

## 2014-03-13 MED ORDER — FLUDEOXYGLUCOSE F - 18 (FDG) INJECTION
5.1000 | Freq: Once | INTRAVENOUS | Status: AC | PRN
  Administered 2014-03-13: 5.1 via INTRAVENOUS

## 2014-03-13 MED ORDER — OXYCODONE HCL 5 MG PO TABS: 5.0000 mg | ORAL_TABLET | ORAL | Status: DC | PRN

## 2014-03-13 NOTE — Telephone Encounter (Signed)
Received vm call from pt stating that pt need refill on his morphine & oxycodone & will come by after pt has PET to p/u.

## 2014-03-16 ENCOUNTER — Ambulatory Visit
Admission: RE | Admit: 2014-03-16 | Discharge: 2014-03-16 | Disposition: A | Discharge status: Home / Self Care | Payer: BLUE CROSS/BLUE SHIELD | Source: Ambulatory Visit | Attending: Radiation Oncology | Admitting: Radiation Oncology

## 2014-03-16 ENCOUNTER — Encounter: Payer: Self-pay | Admitting: Hematology

## 2014-03-16 ENCOUNTER — Ambulatory Visit: Payer: BC Managed Care – PPO

## 2014-03-16 ENCOUNTER — Other Ambulatory Visit (HOSPITAL_BASED_OUTPATIENT_CLINIC_OR_DEPARTMENT_OTHER): Payer: BLUE CROSS/BLUE SHIELD

## 2014-03-16 ENCOUNTER — Ambulatory Visit (HOSPITAL_BASED_OUTPATIENT_CLINIC_OR_DEPARTMENT_OTHER): Payer: BLUE CROSS/BLUE SHIELD | Admitting: Hematology

## 2014-03-16 ENCOUNTER — Other Ambulatory Visit: Payer: BC Managed Care – PPO

## 2014-03-16 ENCOUNTER — Telehealth: Payer: Self-pay | Admitting: Hematology

## 2014-03-16 ENCOUNTER — Encounter: Payer: Self-pay | Admitting: Radiation Oncology

## 2014-03-16 VITALS — BP 102/73 | HR 67 | Temp 97.8°F | Resp 18 | Wt 105.5 lb

## 2014-03-16 VITALS — BP 102/73 | HR 67 | Temp 97.8°F | Resp 18 | Ht 67.0 in | Wt 105.5 lb

## 2014-03-16 DIAGNOSIS — C3490 Malignant neoplasm of unspecified part of unspecified bronchus or lung: Secondary | ICD-10-CM

## 2014-03-16 DIAGNOSIS — M545 Low back pain, unspecified: Secondary | ICD-10-CM

## 2014-03-16 DIAGNOSIS — C3491 Malignant neoplasm of unspecified part of right bronchus or lung: Secondary | ICD-10-CM

## 2014-03-16 DIAGNOSIS — J449 Chronic obstructive pulmonary disease, unspecified: Secondary | ICD-10-CM

## 2014-03-16 DIAGNOSIS — J700 Acute pulmonary manifestations due to radiation: Secondary | ICD-10-CM

## 2014-03-16 DIAGNOSIS — R918 Other nonspecific abnormal finding of lung field: Secondary | ICD-10-CM

## 2014-03-16 DIAGNOSIS — K1230 Oral mucositis (ulcerative), unspecified: Secondary | ICD-10-CM

## 2014-03-16 DIAGNOSIS — C7A1 Malignant poorly differentiated neuroendocrine tumors: Secondary | ICD-10-CM

## 2014-03-16 DIAGNOSIS — C7B8 Other secondary neuroendocrine tumors: Secondary | ICD-10-CM

## 2014-03-16 DIAGNOSIS — R630 Anorexia: Secondary | ICD-10-CM

## 2014-03-16 LAB — COMPREHENSIVE METABOLIC PANEL (CC13)
ALT: 8 U/L (ref 0–55)
ANION GAP: 6 meq/L (ref 3–11)
AST: 13 U/L (ref 5–34)
Albumin: 3.4 g/dL — ABNORMAL LOW (ref 3.5–5.0)
Alkaline Phosphatase: 75 U/L (ref 40–150)
BILIRUBIN TOTAL: 0.2 mg/dL (ref 0.20–1.20)
BUN: 8.4 mg/dL (ref 7.0–26.0)
CHLORIDE: 103 meq/L (ref 98–109)
CO2: 30 mEq/L — ABNORMAL HIGH (ref 22–29)
CREATININE: 0.7 mg/dL (ref 0.7–1.3)
Calcium: 9.3 mg/dL (ref 8.4–10.4)
EGFR: >90 mL/min/{1.73_m2} (ref >90)
Glucose: 106 mg/dl (ref 70–140)
POTASSIUM: 4.2 meq/L (ref 3.5–5.1)
Sodium: 139 mEq/L (ref 136–145)
TOTAL PROTEIN: 6.9 g/dL (ref 6.4–8.3)

## 2014-03-16 LAB — CBC WITH DIFFERENTIAL/PLATELET
BASO%: 0.2 % (ref 0.0–2.0)
Basophils Absolute: 0 10*3/uL (ref 0.0–0.1)
EOS ABS: 0.2 10*3/uL (ref 0.0–0.5)
EOS%: 2.4 % (ref 0.0–7.0)
HEMATOCRIT: 36.1 % — AB (ref 38.4–49.9)
HGB: 11.9 g/dL — ABNORMAL LOW (ref 13.0–17.1)
LYMPH%: 2.5 % — AB (ref 14.0–49.0)
MCH: 32.3 pg (ref 27.2–33.4)
MCHC: 33 g/dL (ref 32.0–36.0)
MCV: 98.1 fL — AB (ref 79.3–98.0)
MONO#: 0.7 10*3/uL (ref 0.1–0.9)
MONO%: 7.8 % (ref 0.0–14.0)
NEUT#: 7.9 10*3/uL — ABNORMAL HIGH (ref 1.5–6.5)
NEUT%: 87.1 % — ABNORMAL HIGH (ref 39.0–75.0)
Platelets: 244 10*3/uL (ref 140–400)
RBC: 3.68 10*6/uL — ABNORMAL LOW (ref 4.20–5.82)
RDW: 12.5 % (ref 11.0–14.6)
WBC: 9.1 10*3/uL (ref 4.0–10.3)
lymph#: 0.2 10*3/uL — ABNORMAL LOW (ref 0.9–3.3)

## 2014-03-16 MED ORDER — OXYCODONE HCL 5 MG PO TABS: 5.0000 mg | ORAL_TABLET | Freq: Four times a day (QID) | ORAL | Status: DC | PRN

## 2014-03-16 MED ORDER — MIRTAZAPINE 30 MG PO TABS: 30.0000 mg | ORAL_TABLET | Freq: Every day | ORAL | Status: DC

## 2014-03-16 MED ORDER — MORPHINE SULFATE ER 30 MG PO TBCR: 30.0000 mg | EXTENDED_RELEASE_TABLET | Freq: Two times a day (BID) | ORAL | Status: DC

## 2014-03-16 NOTE — Progress Notes (Signed)
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  PREVIOUS TREATMENT: 1. First-line chemotherapy with 5 cycles of carboplatin (cisplatin for the first cycle) and etoposide from 05/18/2013 to 10/15/2013.  2.  WB XRT and palliative chest radiation given on 05/03/13 - 05/18/13 per Dr. Pablo Ledger.   OTHER PROBLEMS 1. 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  2. COPD, on home oxygen 2 L/min most of time.  INTERVAL HISTORY: Mr. Petta returns for follow-up. He is doing well overall. He denies any new pain or other new symptoms. His pain at the right low chest is stable, well controlled by MS Contin and oxycodone 2-3 tablets a day. He has good appetite and eats well. He uses his oxygen at night and half of his daytime. He is able to walk up and down his stairs without oxygen, able to do all his ADLs and light activities. He is still on prednisone 10 g daily, his shortness breath on exertion is stable in the past one months.  REVIEW OF SYSTEMS:   Constitutional: Denies fevers, chills or abnormal weight loss.  Eyes: Denies blurriness of vision Ears, nose, mouth, throat, and face: Denies mucositis or sore throat Respiratory: Denies cough, (+)dyspnea, no wheezes Cardiovascular: Denies palpitation, chest discomfort or lower extremity swelling Gastrointestinal:  Denies nausea, heartburn or change in bowel habits Skin: Denies abnormal skin rashes Lymphatics: Denies new lymphadenopathy or easy bruising Neurological:Denies numbness, tingling or new weaknesses Behavioral/Psych: Mood is stable, no new changes  All other systems were reviewed with the patient and are negative.  MEDICAL HISTORY:  Past Medical History  Diagnosis Date  . Hyperlipidemia   .  Hypertension   . GERD (gastroesophageal reflux disease)   . Respiratory failure with hypoxia 04/21/2013    secondary to pneumonia/notes 04/21/2013  . Pulmonary embolism     "got one in there now" (04/21/2013)  . COPD (chronic obstructive pulmonary disease)   . Pneumonia 2/?01/2014    "wouldn't get better; hospitalized 04/21/2013)  . Arthritis     "minor in my right hand" (04/21/2013)  . History of radiation therapy 05/03/13-05/18/13    chest & whole brain  . Cancer     lung ca dx'd 03/2013    SURGICAL HISTORY: Past Surgical History  Procedure Laterality Date  . Inguinal hernia repair Right ~ 2005  . Finger surgery Left ~ 1989    "crushed end of my middle finger off"   . Video bronchoscopy Bilateral 04/26/2013    Procedure: VIDEO BRONCHOSCOPY WITH FLUORO;  Surgeon: Wilhelmina Mcardle, MD;  Location: [facility] ENDOSCOPY;  Service: Cardiopulmonary;  Laterality: Bilateral;    I have reviewed the social history and family history with the patient and they are unchanged from previous note.  ALLERGIES:  is allergic to decadron and augmentin.  MEDICATIONS:  Current Outpatient Prescriptions  Medication Sig Dispense Refill  . albuterol (PROVENTIL HFA;VENTOLIN HFA) 108 (90 BASE) MCG/ACT inhaler Inhale 2 puffs into the lungs every 6 (six) hours as needed for wheezing or shortness of breath. 1 Inhaler 2  . docusate sodium (COLACE) 100 MG capsule Take 100 mg by mouth 2 (two) times daily.    . Iron-Vitamins (GERITOL PO) Take 1 tablet by mouth daily.    [name] lidocaine-prilocaine (EMLA) cream Apply to port site one hour before treatment  and cover with plastic wrap. 1 each 2  . LORazepam (ATIVAN) 0.5 MG tablet Take 1 tablet (0.5 mg total) by mouth 2 (two) times daily as needed. for anxiety 60 tablet 0  . meclizine (ANTIVERT) 25 MG tablet TAKE ONE TABLET BY MOUTH THREE TIMES DAILY AS NEEDED FOR DIZZINESS 30 tablet 1  . mirtazapine (REMERON) 30 MG tablet TAKE ONE TABLET BY MOUTH AT BEDTIME 30 tablet 0  . morphine (MS  CONTIN) 30 MG 12 hr tablet Take 1 tablet (30 mg total) by mouth every 12 (twelve) hours. 60 tablet 0  . nystatin (MYCOSTATIN) 100000 UNIT/ML suspension Use as directed 5 mLs in the mouth or throat 4 (four) times daily as needed (after chemo).     [name] omeprazole (PRILOSEC) 40 MG capsule Take 40 mg by mouth daily.     . ondansetron (ZOFRAN) 8 MG tablet Take 8 mg by mouth every 8 (eight) hours as needed for nausea or vomiting (nausea).     [name] oxyCODONE (OXY IR/ROXICODONE) 5 MG immediate release tablet Take 1 tablet (5 mg total) by mouth every 4 (four) hours as needed for severe pain. 60 tablet 0  . polyethylene glycol powder (MIRALAX) powder Take 17 g by mouth daily. 255 g 0  . predniSONE (DELTASONE) 10 MG tablet Take 1 tablet (10 mg total) by mouth daily with breakfast. 30 tablet 1  . prochlorperazine (COMPAZINE) 10 MG tablet Take 10 mg by mouth every 6 (six) hours as needed for nausea or vomiting (nausea).     . rivaroxaban (XARELTO) 20 MG TABS tablet Take 1 tablet (20 mg total) by mouth daily with supper. 30 tablet 1  . tiotropium (SPIRIVA HANDIHALER) 18 MCG inhalation capsule Place 1 capsule (18 mcg total) into inhaler and inhale daily. 90 capsule 1  . vitamin B-12 (CYANOCOBALAMIN) 100 MCG tablet Take 100 mcg by mouth daily.     No current facility-administered medications for this visit.   Facility-Administered Medications Ordered in Other Visits  Medication Dose Route Frequency Provider Last Rate Last Dose  . 0.9 %  sodium chloride infusion  1,000 mL Intravenous Once Concha Norway, MD      . 0.9 %  sodium chloride infusion   Intravenous Once Concha Norway, MD        PHYSICAL EXAMINATION: ECOG PERFORMANCE STATUS: 1 - Symptomatic but completely ambulatory  Filed Vitals:   03/16/14 1315  BP: 102/73  Pulse: 67  Temp: 97.8 F (36.6 C)  Resp: 18   Filed Weights   03/16/14 1315  Weight: 105 lb 8 oz (47.854 kg)    GENERAL: A thin Caucasian male, alert, no distress and comfortable, on nasal  cannula oxygen.  SKIN: skin color, texture, turgor are normal, no rashes or significant lesions EYES: normal, Conjunctiva are pink and non-injected, sclera clear OROPHARYNX:no exudate, no erythema and lips, buccal mucosa, and tongue normal  NECK: supple, thyroid normal size, non-tender, without nodularity LYMPH:  no palpable lymphadenopathy in the cervical, axillary or inguinal LUNGS: clear to auscultation and percussion with normal breathing effort HEART: regular rate & rhythm and no murmurs and no lower extremity edema ABDOMEN:abdomen soft, non-tender and normal bowel sounds Musculoskeletal:no cyanosis of digits and no clubbing  NEURO: alert & oriented x 3 with fluent speech, no focal motor/sensory deficits  LABORATORY DATA:  I have reviewed the data as listed CBC Latest Ref Rng 03/16/2014 02/07/2014 01/06/2014  WBC 4.0 - 10.3 10e3/uL 9.1 9.0 5.6  Hemoglobin 13.0 - 17.1 g/dL 11.9(L) 11.3(L) 11.8(L)  Hematocrit 38.4 - 49.9 % 36.1(L) 34.7(L) 34.7(L)  Platelets 140 - 400 10e3/uL 244 255 220     CMP Latest Ref Rng 03/16/2014 02/07/2014 01/06/2014  Glucose 70 - 140 mg/dl 106 137 123(H)  BUN 7.0 - 26.0 mg/dL 8.4 11.0 10  Creatinine 0.7 - 1.3 mg/dL 0.7 0.8 0.73  Sodium 136 - 145 mEq/L 139 139 139  Potassium 3.5 - 5.1 mEq/L 4.2 4.2 4.3  Chloride 96 - 112 mEq/L - - 100  CO2 22 - 29 mEq/L 30(H) 28 25  Calcium 8.4 - 10.4 mg/dL 9.3 8.7 9.7  Total Protein 6.4 - 8.3 g/dL 6.9 5.9(L) -  Total Bilirubin 0.20 - 1.20 mg/dL 0.20 <0.20 -  Alkaline Phos 40 - 150 U/L 75 60 -  AST 5 - 34 U/L 13 14 -  ALT 0 - 55 U/L 8 17 -      RADIOGRAPHIC STUDIES: I have personally reviewed the radiological images as listed and agreed with the findings in the report.   CT chest with contrast on 01/06/2014 IMPRESSION: Increased infiltrative changes in the RIGHT lower lobe and perihilar/paramediastinal regions of the RIGHT lung, suspect related to radiation pneumonitis. Bb  Minimal decrease in size of RIGHT  lower lobe mass.  Underlying severe emphysematous and bronchitic changes.  No new intrathoracic abnormalities identified.  PET 03/13/14 IMPRESSION: 1. No evidence of residual metabolically active tumor in the right hemithorax or metastatic disease. 2. Progressive radiation changes throughout the right lower lobe with associated low level hypermetabolic activity. 3. Emphysema and atherosclerosis noted.   ASSESSMENT & PLAN:   64 year old male with history of heavy smoking and COPD, was found to have metastatic small cell lung cancer to brain.   1. 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 -He is status post first line chemotherapy and whole brain radiation. Currently on chemotherapy break.  -I reviewed his restaging PET scan with patient and his family. It showed excellent response. There is only some residual FDG uptake at the primary tumor site, which also could be related to his radiation pneumonitis. No other evidence of disease. -His recent restaging brain MRI also showed excellent response without new lesions. -I will continue surveillance, repeat CT scan in 3-4 months.  2. Radiation related pneumonitis -His symptom has much improved with low-dose prednisone, he has been stable in the past months. -I told him to taper off the prednisone in the next 3 weeks. Please see the instruction below   3. Anorexia  -March Improved since he has been off chemotherapy and taking steroids and a mirtazapine. -I reviewed his mirtazapine today  4. COPD -Continue current metastases and oxygen as needed.  Plan: 1. Change to prednisone 7.5mg  daily for 7 days, then 5mg  dialy for 7 days, then 5mg  every other day for one more week. Then stop.  - RTC in 6 weeks With lab and exam.  All questions were answered. The patient knows to call the clinic with any problems, questions or concerns. No barriers to learning  was detected.  I spent 20 minutes counseling the patient face to face. The total time spent in the appointment was 30 minutes and more than 50% was on counseling and review of test results     Truitt Merle, MD 03/16/2014  1:30 PM

## 2014-03-16 NOTE — Telephone Encounter (Signed)
No voice mail box on cell phone. Home number would not go through as well. Mailed calendar.

## 2014-03-16 NOTE — Progress Notes (Signed)
He is currently in no pain. Reports intermittent "stabbing" pain over his right lower lobe (lung), he takes Morphine 30mg  every 12hrs, and oxycodone 5mg  for break through pain. Pt complains of poor appetite. Wheezing, Shortness of Breath on exertion and Coughing  Dry, Productive and Color of Phlegm  yellow. Pt is on 2 liters/min via Patient connected to nasal cannula oxygen. Pt denies dysphagia. Altered taste, reports 2 cans a week of boost.  BP 102/73 mmHg  Pulse 67  Temp(Src) 97.8 F (36.6 C) (Oral)  Resp 18  Wt 105 lb 8 oz (47.854 kg)  SpO2 94%

## 2014-03-16 NOTE — Progress Notes (Signed)
Department of Radiation Oncology  Phone:  [phone] Fax:        [phone]   Name: Ricky Villanueva MRN: [id number]  DOB: 1950/09/12  Date: 03/16/2014  Follow Up Visit Note  Diagnosis: Extensive Stage Small Cell Lung Cancer  Summary and Interval since last radiation: 30 gy in 12 fractions to chest and brain completed 3/25/215  Interval History: Ricky Villanueva presents today for routine followup.  He denies headaches or visual change.His tinnitus has resolved. He has benign positional vertigo when he moves from lying to standing or from sitting to standing too quickly. He has a good appetite. He had a brain MRI which showed no evidence of disease and a PET scan which was negative as well. He is still on Prednisone for his radiation pneumonitis and is feeling good from that standpoint. He is no longer receiving weekly IV fluids. He spends most of his time in a chair but enjoys seeing his grandson. [name]  He still has diminished taste and hearing. He has a stable cough but no fever or chills. He takes morphine for right sided lung pain. He is interested in seeing a physician who specializes in lung cancer.   Allergies:  Allergies  Allergen Reactions  . Decadron [Dexamethasone] Other (See Comments)    Slurred speech  . Augmentin [Amoxicillin-Pot Clavulanate] Itching and Rash    rash    Medications:  Current Outpatient Prescriptions  Medication Sig Dispense Refill  . albuterol (PROVENTIL HFA;VENTOLIN HFA) 108 (90 BASE) MCG/ACT inhaler Inhale 2 puffs into the lungs every 6 (six) hours as needed for wheezing or shortness of breath. 1 Inhaler 2  . docusate sodium (COLACE) 100 MG capsule Take 100 mg by mouth 2 (two) times daily.    . Iron-Vitamins (GERITOL PO) Take 1 tablet by mouth daily.    [name] lidocaine-prilocaine (EMLA) cream Apply to port site one hour before treatment and cover with plastic wrap. 1 each 2  . LORazepam (ATIVAN) 0.5 MG tablet Take 1 tablet (0.5 mg total) by mouth 2 (two) times  daily as needed. for anxiety 60 tablet 0  . meclizine (ANTIVERT) 25 MG tablet TAKE ONE TABLET BY MOUTH THREE TIMES DAILY AS NEEDED FOR DIZZINESS 30 tablet 1  . mirtazapine (REMERON) 30 MG tablet Take 1 tablet (30 mg total) by mouth at bedtime. 90 tablet 1  . morphine (MS CONTIN) 30 MG 12 hr tablet Take 1 tablet (30 mg total) by mouth every 12 (twelve) hours. 60 tablet 0  . nystatin (MYCOSTATIN) 100000 UNIT/ML suspension Use as directed 5 mLs in the mouth or throat 4 (four) times daily as needed (after chemo).     [name] omeprazole (PRILOSEC) 40 MG capsule Take 40 mg by mouth daily.     . ondansetron (ZOFRAN) 8 MG tablet Take 8 mg by mouth every 8 (eight) hours as needed for nausea or vomiting (nausea).     [name] oxyCODONE (OXY IR/ROXICODONE) 5 MG immediate release tablet Take 1 tablet (5 mg total) by mouth every 6 (six) hours as needed for severe pain. 120 tablet 0  . polyethylene glycol powder (MIRALAX) powder Take 17 g by mouth daily. 255 g 0  . predniSONE (DELTASONE) 10 MG tablet Take 1 tablet (10 mg total) by mouth daily with breakfast. 30 tablet 1  . prochlorperazine (COMPAZINE) 10 MG tablet Take 10 mg by mouth every 6 (six) hours as needed for nausea or vomiting (nausea).     . rivaroxaban (XARELTO) 20 MG TABS tablet Take 1  tablet (20 mg total) by mouth daily with supper. 30 tablet 1  . tiotropium (SPIRIVA HANDIHALER) 18 MCG inhalation capsule Place 1 capsule (18 mcg total) into inhaler and inhale daily. 90 capsule 1  . vitamin B-12 (CYANOCOBALAMIN) 100 MCG tablet Take 100 mcg by mouth daily.     No current facility-administered medications for this encounter.   Facility-Administered Medications Ordered in Other Encounters  Medication Dose Route Frequency Provider Last Rate Last Dose  . 0.9 %  sodium chloride infusion  1,000 mL Intravenous Once Concha Norway, MD      . 0.9 %  sodium chloride infusion   Intravenous Once Concha Norway, MD        Physical Exam:  Filed Vitals:   03/16/14 1407  BP:  102/73  Pulse: 67  Temp: 97.8 F (36.6 C)  TempSrc: Oral  Resp: 18  Weight: 105 lb 8 oz (47.854 kg)  SpO2: 94%   Appears cachetic but alert. Normal respiratory effort. Nasal cannula in place.   IMPRESSION: Ricky Villanueva is a 64 y.o. male s/p palliative RT to the chest and brain with NED  PLAN:  Doing well. RTC in 3 months with repeat brain MRI and CT scan which I have ordered. I will again attempt to refer him to Dr. [name] at his request. He will continue the prednisone for now as he still has some respiratory symptoms.  I encouraged him to join our living with cancer support group.     [name], MD

## 2014-03-20 ENCOUNTER — Telehealth: Payer: Self-pay | Admitting: *Deleted

## 2014-03-20 NOTE — Telephone Encounter (Signed)
CALLED PATIENT TO INFORM OF LAB, CT AND MRI AND FU FOR 05-2014, SPOKE WITH PATIENT'S WIFE BONNIE AND SHE IS AWARE OF THESE APPTS.

## 2014-03-27 ENCOUNTER — Telehealth: Payer: Self-pay | Admitting: *Deleted

## 2014-03-27 NOTE — Telephone Encounter (Signed)
Received vm call from pt's wife stating that pt needed a refill on his lorazepam called to [facility].

## 2014-03-28 MED ORDER — LORAZEPAM 0.5 MG PO TABS: 0.5000 mg | ORAL_TABLET | Freq: Two times a day (BID) | ORAL | Status: DC | PRN

## 2014-03-28 NOTE — Telephone Encounter (Signed)
Script will be called in today.

## 2014-04-10 ENCOUNTER — Other Ambulatory Visit: Payer: Self-pay | Admitting: *Deleted

## 2014-04-10 DIAGNOSIS — M545 Low back pain, unspecified: Secondary | ICD-10-CM

## 2014-04-10 DIAGNOSIS — K1230 Oral mucositis (ulcerative), unspecified: Secondary | ICD-10-CM

## 2014-04-10 DIAGNOSIS — C3490 Malignant neoplasm of unspecified part of unspecified bronchus or lung: Secondary | ICD-10-CM

## 2014-04-10 DIAGNOSIS — R918 Other nonspecific abnormal finding of lung field: Secondary | ICD-10-CM

## 2014-04-10 MED ORDER — RIVAROXABAN 20 MG PO TABS: 20.0000 mg | ORAL_TABLET | Freq: Every day | ORAL | Status: DC

## 2014-04-19 ENCOUNTER — Other Ambulatory Visit: Payer: Self-pay | Admitting: *Deleted

## 2014-05-01 ENCOUNTER — Other Ambulatory Visit (HOSPITAL_BASED_OUTPATIENT_CLINIC_OR_DEPARTMENT_OTHER): Payer: BLUE CROSS/BLUE SHIELD

## 2014-05-01 ENCOUNTER — Encounter: Payer: Self-pay | Admitting: Hematology

## 2014-05-01 ENCOUNTER — Ambulatory Visit (HOSPITAL_BASED_OUTPATIENT_CLINIC_OR_DEPARTMENT_OTHER): Payer: BLUE CROSS/BLUE SHIELD | Admitting: Hematology

## 2014-05-01 VITALS — BP 122/77 | HR 75 | Temp 97.7°F | Resp 18 | Ht 67.0 in | Wt 105.5 lb

## 2014-05-01 DIAGNOSIS — R630 Anorexia: Secondary | ICD-10-CM

## 2014-05-01 DIAGNOSIS — C3491 Malignant neoplasm of unspecified part of right bronchus or lung: Secondary | ICD-10-CM

## 2014-05-01 DIAGNOSIS — C3490 Malignant neoplasm of unspecified part of unspecified bronchus or lung: Secondary | ICD-10-CM

## 2014-05-01 DIAGNOSIS — K1230 Oral mucositis (ulcerative), unspecified: Secondary | ICD-10-CM

## 2014-05-01 DIAGNOSIS — R918 Other nonspecific abnormal finding of lung field: Secondary | ICD-10-CM

## 2014-05-01 DIAGNOSIS — G893 Neoplasm related pain (acute) (chronic): Secondary | ICD-10-CM

## 2014-05-01 DIAGNOSIS — J700 Acute pulmonary manifestations due to radiation: Secondary | ICD-10-CM

## 2014-05-01 DIAGNOSIS — C7931 Secondary malignant neoplasm of brain: Secondary | ICD-10-CM

## 2014-05-01 DIAGNOSIS — M545 Low back pain, unspecified: Secondary | ICD-10-CM

## 2014-05-01 DIAGNOSIS — F419 Anxiety disorder, unspecified: Secondary | ICD-10-CM

## 2014-05-01 LAB — CBC WITH DIFFERENTIAL/PLATELET
BASO%: 0.3 % (ref 0.0–2.0)
Basophils Absolute: 0 10*3/uL (ref 0.0–0.1)
EOS%: 1.6 % (ref 0.0–7.0)
Eosinophils Absolute: 0.1 10*3/uL (ref 0.0–0.5)
HEMATOCRIT: 39.1 % (ref 38.4–49.9)
HGB: 12.6 g/dL — ABNORMAL LOW (ref 13.0–17.1)
LYMPH%: 8.4 % — ABNORMAL LOW (ref 14.0–49.0)
MCH: 31.8 pg (ref 27.2–33.4)
MCHC: 32.1 g/dL (ref 32.0–36.0)
MCV: 99.1 fL — AB (ref 79.3–98.0)
MONO#: 0.7 10*3/uL (ref 0.1–0.9)
MONO%: 11.1 % (ref 0.0–14.0)
NEUT#: 5 10*3/uL (ref 1.5–6.5)
NEUT%: 78.6 % — ABNORMAL HIGH (ref 39.0–75.0)
Platelets: 229 10*3/uL (ref 140–400)
RBC: 3.94 10*6/uL — ABNORMAL LOW (ref 4.20–5.82)
RDW: 13.4 % (ref 11.0–14.6)
WBC: 6.4 10*3/uL (ref 4.0–10.3)
lymph#: 0.5 10*3/uL — ABNORMAL LOW (ref 0.9–3.3)

## 2014-05-01 LAB — COMPREHENSIVE METABOLIC PANEL (CC13)
ALBUMIN: 3.3 g/dL — AB (ref 3.5–5.0)
ALT: 7 U/L (ref 0–55)
ANION GAP: 7 meq/L (ref 3–11)
AST: 14 U/L (ref 5–34)
Alkaline Phosphatase: 60 U/L (ref 40–150)
BUN: 12.9 mg/dL (ref 7.0–26.0)
CALCIUM: 9.1 mg/dL (ref 8.4–10.4)
CHLORIDE: 103 meq/L (ref 98–109)
CO2: 30 meq/L — AB (ref 22–29)
CREATININE: 0.8 mg/dL (ref 0.7–1.3)
Glucose: 98 mg/dl (ref 70–140)
Potassium: 4.2 mEq/L (ref 3.5–5.1)
SODIUM: 141 meq/L (ref 136–145)
TOTAL PROTEIN: 6.4 g/dL (ref 6.4–8.3)
Total Bilirubin: 0.45 mg/dL (ref 0.20–1.20)

## 2014-05-01 MED ORDER — LORAZEPAM 0.5 MG PO TABS: 0.5000 mg | ORAL_TABLET | Freq: Two times a day (BID) | ORAL | Status: DC | PRN

## 2014-05-01 MED ORDER — MORPHINE SULFATE ER 30 MG PO TBCR
30.0000 mg | EXTENDED_RELEASE_TABLET | Freq: Two times a day (BID) | ORAL | Status: DC
Start: 2014-05-01 — End: 2014-06-14

## 2014-05-01 MED ORDER — OXYCODONE HCL 5 MG PO TABS
5.0000 mg | ORAL_TABLET | Freq: Four times a day (QID) | ORAL | Status: DC | PRN
Start: 2014-05-01 — End: 2014-06-16

## 2014-05-01 NOTE — Progress Notes (Addendum)
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  PREVIOUS TREATMENT: 1. First-line chemotherapy with 5 cycles of carboplatin (cisplatin for the first cycle) and etoposide from 05/18/2013 to 10/15/2013.  2.  WB XRT and palliative chest radiation given on 05/03/13 - 05/18/13 per Dr. Pablo Ledger.   OTHER PROBLEMS 1. 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  2. COPD, on home oxygen 2 L/min most of time.  INTERVAL HISTORY: [name] returns for follow-up. He is doing well overall.  His appetite and energy level did drop after he weaned off prednisone 3 weeks ago. He eats 2 meals a day, he does not like nutrition supplement. His weight is stable. His cough is much better, dyspnea on exertion is stable, no chest pain or other new symptoms. He is on oxygen continuously, able to take care of himself at home.   REVIEW OF SYSTEMS:   Constitutional: Denies fevers, chills or abnormal weight loss.  Eyes: Denies blurriness of vision Ears, nose, mouth, throat, and face: Denies mucositis or sore throat Respiratory: Denies cough, (+)dyspnea, no wheezes Cardiovascular: Denies palpitation, chest discomfort or lower extremity swelling Gastrointestinal:  Denies nausea, heartburn or change in bowel habits Skin: Denies abnormal skin rashes Lymphatics: Denies new lymphadenopathy or easy bruising Neurological:Denies numbness, tingling or new weaknesses Behavioral/Psych: Mood is stable, no new changes  All other systems were reviewed with the patient and are negative.  MEDICAL HISTORY:  Past Medical History  Diagnosis Date  . Hyperlipidemia   . Hypertension   . GERD (gastroesophageal reflux disease)   . Respiratory failure with hypoxia 04/21/2013    secondary to  pneumonia/notes 04/21/2013  . Pulmonary embolism     "got one in there now" (04/21/2013)  . COPD (chronic obstructive pulmonary disease)   . Pneumonia 2/?01/2014    "wouldn't get better; hospitalized 04/21/2013)  . Arthritis     "minor in my right hand" (04/21/2013)  . History of radiation therapy 05/03/13-05/18/13    chest & whole brain  . Cancer     lung ca dx'd 03/2013    SURGICAL HISTORY: Past Surgical History  Procedure Laterality Date  . Inguinal hernia repair Right ~ 2005  . Finger surgery Left ~ 1989    "crushed end of my middle finger off"   . Video bronchoscopy Bilateral 04/26/2013    Procedure: VIDEO BRONCHOSCOPY WITH FLUORO;  Surgeon: Wilhelmina Mcardle, MD;  Location: [facility] ENDOSCOPY;  Service: Cardiopulmonary;  Laterality: Bilateral;    I have reviewed the social history and family history with the patient and they are unchanged from previous note.  ALLERGIES:  is allergic to decadron and augmentin.  MEDICATIONS:  Current Outpatient Prescriptions  Medication Sig Dispense Refill  . albuterol (PROVENTIL HFA;VENTOLIN HFA) 108 (90 BASE) MCG/ACT inhaler Inhale 2 puffs into the lungs every 6 (six) hours as needed for wheezing or shortness of breath. 1 Inhaler 2  . docusate sodium (COLACE) 100 MG capsule Take 100 mg by mouth 2 (two) times daily.    . Iron-Vitamins (GERITOL PO) Take 1 tablet by mouth daily.    [name] lidocaine-prilocaine (EMLA) cream Apply to port site one hour before treatment and cover with plastic wrap. 1 each 2  . LORazepam (ATIVAN) 0.5 MG tablet Take 1 tablet (0.5 mg total) by mouth 2 (two)  times daily as needed. for anxiety 60 tablet 0  . meclizine (ANTIVERT) 25 MG tablet TAKE ONE TABLET BY MOUTH THREE TIMES DAILY AS NEEDED FOR DIZZINESS 30 tablet 1  . mirtazapine (REMERON) 30 MG tablet Take 1 tablet (30 mg total) by mouth at bedtime. 90 tablet 1  . morphine (MS CONTIN) 30 MG 12 hr tablet Take 1 tablet (30 mg total) by mouth every 12 (twelve) hours. 60 tablet 0  .  nystatin (MYCOSTATIN) 100000 UNIT/ML suspension Use as directed 5 mLs in the mouth or throat 4 (four) times daily as needed (after chemo).     [name] omeprazole (PRILOSEC) 40 MG capsule Take 40 mg by mouth daily.     . ondansetron (ZOFRAN) 8 MG tablet Take 8 mg by mouth every 8 (eight) hours as needed for nausea or vomiting (nausea).     [name] oxyCODONE (OXY IR/ROXICODONE) 5 MG immediate release tablet Take 1 tablet (5 mg total) by mouth every 6 (six) hours as needed for severe pain. 120 tablet 0  . polyethylene glycol powder (MIRALAX) powder Take 17 g by mouth daily. 255 g 0  . predniSONE (DELTASONE) 10 MG tablet Take 1 tablet (10 mg total) by mouth daily with breakfast. 30 tablet 1  . prochlorperazine (COMPAZINE) 10 MG tablet Take 10 mg by mouth every 6 (six) hours as needed for nausea or vomiting (nausea).     . rivaroxaban (XARELTO) 20 MG TABS tablet Take 1 tablet (20 mg total) by mouth daily with supper. 30 tablet 1  . tiotropium (SPIRIVA HANDIHALER) 18 MCG inhalation capsule Place 1 capsule (18 mcg total) into inhaler and inhale daily. 90 capsule 1  . vitamin B-12 (CYANOCOBALAMIN) 100 MCG tablet Take 100 mcg by mouth daily.     No current facility-administered medications for this visit.   Facility-Administered Medications Ordered in Other Visits  Medication Dose Route Frequency Provider Last Rate Last Dose  . 0.9 %  sodium chloride infusion  1,000 mL Intravenous Once Concha Norway, MD      . 0.9 %  sodium chloride infusion   Intravenous Once Concha Norway, MD        PHYSICAL EXAMINATION: ECOG PERFORMANCE STATUS: 1 - Symptomatic but completely ambulatory  Filed Vitals:   05/01/14 1319  BP: 122/77  Pulse: 75  Temp: 97.7 F (36.5 C)  Resp: 18   Filed Weights   05/01/14 1319  Weight: 105 lb 8 oz (47.854 kg)    GENERAL: A thin Caucasian male, alert, no distress and comfortable, on nasal cannula oxygen.  SKIN: skin color, texture, turgor are normal, no rashes or significant lesions EYES:  normal, Conjunctiva are pink and non-injected, sclera clear OROPHARYNX:no exudate, no erythema and lips, buccal mucosa, and tongue normal  NECK: supple, thyroid normal size, non-tender, without nodularity LYMPH:  no palpable lymphadenopathy in the cervical, axillary or inguinal LUNGS: clear to auscultation and percussion with normal breathing effort HEART: regular rate & rhythm and no murmurs and no lower extremity edema ABDOMEN:abdomen soft, non-tender and normal bowel sounds Musculoskeletal:no cyanosis of digits and no clubbing  NEURO: alert & oriented x 3 with fluent speech, no focal motor/sensory deficits  LABORATORY DATA:  I have reviewed the data as listed CBC Latest Ref Rng 05/01/2014 03/16/2014 02/07/2014  WBC 4.0 - 10.3 10e3/uL 6.4 9.1 9.0  Hemoglobin 13.0 - 17.1 g/dL 12.6(L) 11.9(L) 11.3(L)  Hematocrit 38.4 - 49.9 % 39.1 36.1(L) 34.7(L)  Platelets 140 - 400 10e3/uL 229 244 255     CMP  Latest Ref Rng 05/01/2014 03/16/2014 02/07/2014  Glucose 70 - 140 mg/dl 98 106 137  BUN 7.0 - 26.0 mg/dL 12.9 8.4 11.0  Creatinine 0.7 - 1.3 mg/dL 0.8 0.7 0.8  Sodium 136 - 145 mEq/L 141 139 139  Potassium 3.5 - 5.1 mEq/L 4.2 4.2 4.2  Chloride 96 - 112 mEq/L - - -  CO2 22 - 29 mEq/L 30(H) 30(H) 28  Calcium 8.4 - 10.4 mg/dL 9.1 9.3 8.7  Total Protein 6.4 - 8.3 g/dL 6.4 6.9 5.9(L)  Total Bilirubin 0.20 - 1.20 mg/dL 0.45 0.20 <0.20  Alkaline Phos 40 - 150 U/L 60 75 60  AST 5 - 34 U/L 14 13 14   ALT 0 - 55 U/L 7 8 17       RADIOGRAPHIC STUDIES: I have personally reviewed the radiological images as listed and agreed with the findings in the report.   CT chest with contrast on 01/06/2014 IMPRESSION: Increased infiltrative changes in the RIGHT lower lobe and perihilar/paramediastinal regions of the RIGHT lung, suspect related to radiation pneumonitis.   Minimal decrease in size of RIGHT lower lobe mass.  Underlying severe emphysematous and bronchitic changes.  No new intrathoracic  abnormalities identified.  PET 03/13/14 IMPRESSION: 1. No evidence of residual metabolically active tumor in the right hemithorax or metastatic disease. 2. Progressive radiation changes throughout the right lower lobe with associated low level hypermetabolic activity. 3. Emphysema and atherosclerosis noted.   ASSESSMENT & PLAN:   64 year old male with history of heavy smoking and COPD, was found to have metastatic small cell lung cancer to brain.   1. Metastatic small cell lung cancer to brain - Unfortunately this is not curable disease, and his treatment goal is palliative. -He is status post first line chemotherapy and whole brain radiation. Currently on chemotherapy break.  -I reviewed his restaging PET scan with patient and his family. It showed excellent response. There is only some residual FDG uptake at the primary tumor site, which also could be related to his radiation pneumonitis. No other evidence of disease. -His recent restaging brain MRI also showed excellent response without new lesions. -I will continue surveillance, repeat CT scan and brain MRI on 06/16/2014.   2. Radiation related pneumonitis -His symptom has much improved with low-dose prednisone, which was tapered off 3 weeks ago. -His respiration symptoms are stable.  3. Anorexia, pain and anxiety -His appetite got Improved after he came off chemotherapy, slightly worse again when his steroids was tapered off  -Continue mirtazapine, we discussed nutrition supplement. -Continue weekly IV fluids by home care nurse. -I refilled his Ativan, morphine and oxycodone today.  4. PE (04/21/2013).  --CTA of chest noted above consistent with Nonocclusive left upper lobe, right lower lobe pulmonary emboli. Right heart strain. Continue xarelto 20 mg daily.   5.COPD -Continue current metastases and oxygen as needed.  Plan: -Return to clinic in April after his CT and brain MRI scan. -He would like to switch to Dr.  [name]. He hears better from man than woman.   All questions were answered. The patient knows to call the clinic with any problems, questions or concerns. No barriers to learning was detected.  I spent 20 minutes counseling the patient face to face. The total time spent in the appointment was 30 minutes and more than 50% was on counseling and review of test results     Truitt Merle, MD 05/01/2014  1:44 PM

## 2014-05-02 ENCOUNTER — Telehealth: Payer: Self-pay | Admitting: Hematology

## 2014-05-02 ENCOUNTER — Telehealth: Payer: Self-pay | Admitting: *Deleted

## 2014-05-02 NOTE — Telephone Encounter (Signed)
Received call from [place], [place] @ [facility] re:  Needs order to continue weekly IVF by [place]. [facility]  Phone   [phone].

## 2014-05-02 NOTE — Telephone Encounter (Signed)
Ronni Rumble, RN @ [facility] and gave her order for continuing IVF by [facility] nurse - as per Dr. Ernestina Penna notes  05/01/14.

## 2014-05-02 NOTE — Telephone Encounter (Signed)
Left message to confirm April. Mailed calendar

## 2014-05-02 NOTE — Addendum Note (Signed)
Addended by: Truitt Merle on: 05/02/2014 08:06 AM   Modules accepted: Orders

## 2014-05-11 ENCOUNTER — Telehealth: Payer: Self-pay | Admitting: *Deleted

## 2014-05-11 NOTE — Telephone Encounter (Signed)
Patient's wife called requesting order for "Simply Go Mini" small concentrated oxygen tank.  What he currently uses is heavy, cumbersome to use as it swings and bruises him.  Order needs to go to [facility] [phone].  Oxygen was ordered by Dr. Juliann Mule.  Last visit was 05-01-2014 with DR. Feng.  Will notify Dr. Burr Medico of this request.

## 2014-05-11 NOTE — Telephone Encounter (Signed)
Faxed order for " Simply Go Mini " small concentrated oxygen tank to [facility] as per wife's request. [facility]  Fax    [phone].

## 2014-05-22 ENCOUNTER — Other Ambulatory Visit: Payer: Self-pay | Admitting: Hematology

## 2014-05-22 ENCOUNTER — Telehealth: Payer: Self-pay | Admitting: *Deleted

## 2014-05-22 NOTE — Telephone Encounter (Signed)
Pt called requesting a refill on lorazepam to be phoned into [facility] in [place]. Has a few tablets left.

## 2014-05-23 ENCOUNTER — Other Ambulatory Visit: Payer: Self-pay

## 2014-05-23 DIAGNOSIS — C3490 Malignant neoplasm of unspecified part of unspecified bronchus or lung: Secondary | ICD-10-CM

## 2014-05-23 MED ORDER — LORAZEPAM 0.5 MG PO TABS: 0.5000 mg | ORAL_TABLET | Freq: Two times a day (BID) | ORAL | Status: DC | PRN

## 2014-05-23 NOTE — Telephone Encounter (Signed)
lvm that we refilled #20 lorazepam at [facility] in [place]. Dr Burr Medico is out of office this week and she will have to reevaluate his use of lorazepam b/c he received #30 on 3/7 with BID prn. It looks like he may be using too many or needs a change in Rx.

## 2014-05-29 ENCOUNTER — Other Ambulatory Visit: Payer: Self-pay | Admitting: *Deleted

## 2014-05-29 ENCOUNTER — Telehealth: Payer: Self-pay | Admitting: *Deleted

## 2014-05-29 DIAGNOSIS — K1230 Oral mucositis (ulcerative), unspecified: Secondary | ICD-10-CM

## 2014-05-29 DIAGNOSIS — C3490 Malignant neoplasm of unspecified part of unspecified bronchus or lung: Secondary | ICD-10-CM

## 2014-05-29 DIAGNOSIS — M545 Low back pain, unspecified: Secondary | ICD-10-CM

## 2014-05-29 DIAGNOSIS — R918 Other nonspecific abnormal finding of lung field: Secondary | ICD-10-CM

## 2014-05-29 MED ORDER — RIVAROXABAN 20 MG PO TABS: 20.0000 mg | ORAL_TABLET | Freq: Every day | ORAL | Status: DC

## 2014-05-29 NOTE — Telephone Encounter (Signed)
Spouse called asking for Xarelto refill.  Would like to get full prescription of lorazepam instead of the twenty pills sent in on 05-23-2014 they have not picked up yet.  Will send in xarelto but informed her the lorazepam cannot be increased until further evaluation with next F/U visit with Dr. [name].

## 2014-05-29 NOTE — Telephone Encounter (Signed)
Spouse called reporting Xarelto requires the doctor to call to obtain permission for [facility] to fill this medicine.  Called [facility] requesting Prior authorization be sent to [phone].

## 2014-05-30 ENCOUNTER — Telehealth: Payer: Self-pay | Admitting: *Deleted

## 2014-05-30 ENCOUNTER — Encounter: Payer: Self-pay | Admitting: Hematology

## 2014-05-30 NOTE — Telephone Encounter (Signed)
CALLED PT.'S PHARMACY. PRIOR AUTHORIZATION HAS BEEN APPROVED. MEDICATION IS READY WITH A COPAY OF $50.00. NOTIFIED PT.'S DAUGHTER.

## 2014-05-30 NOTE — Progress Notes (Signed)
Per [facility] Xarelto approved 04/30/14-05/30/15. Case [id number]

## 2014-06-13 ENCOUNTER — Other Ambulatory Visit: Payer: Self-pay | Admitting: *Deleted

## 2014-06-14 ENCOUNTER — Telehealth: Payer: Self-pay | Admitting: *Deleted

## 2014-06-14 ENCOUNTER — Other Ambulatory Visit: Payer: Self-pay | Admitting: *Deleted

## 2014-06-14 DIAGNOSIS — M545 Low back pain, unspecified: Secondary | ICD-10-CM

## 2014-06-14 DIAGNOSIS — R918 Other nonspecific abnormal finding of lung field: Secondary | ICD-10-CM

## 2014-06-14 DIAGNOSIS — K1230 Oral mucositis (ulcerative), unspecified: Secondary | ICD-10-CM

## 2014-06-14 DIAGNOSIS — C3490 Malignant neoplasm of unspecified part of unspecified bronchus or lung: Secondary | ICD-10-CM

## 2014-06-14 MED ORDER — MORPHINE SULFATE ER 30 MG PO TBCR: 30.0000 mg | EXTENDED_RELEASE_TABLET | Freq: Two times a day (BID) | ORAL | Status: DC

## 2014-06-14 NOTE — Progress Notes (Signed)
Department of Radiation Oncology  Phone:  [phone] Fax:        [phone]   Name: Ricky Villanueva MRN: [id number]  DOB: 11-23-1950  Date: 06/16/2014  Follow Up Visit Note  Diagnosis: Extensive Stage Small Cell Lung Cancer  Summary and Interval since last radiation: 30 gy in 12 fractions to chest and brain completed 3/25/215  Interval History: Ricky Villanueva presents today for routine followup. He had a CT of the chest which showed some increased consolidation in the right lung at the edge of the radiated field as well as some questionable changes in the brainstem which were subtle.  He is scheduled to see Dr. [name] on Monday.   He is feeling well and looks the best I've ever seen him. His appetite is good. He is working in the yard with his daughter taking down a tree and doing some plumbing work. He energy levels and attitude are great.   Allergies:  Allergies  Allergen Reactions  . Decadron [Dexamethasone] Other (See Comments)    Slurred speech  . Augmentin [Amoxicillin-Pot Clavulanate] Itching and Rash    rash    Medications:  Current Outpatient Prescriptions  Medication Sig Dispense Refill  . albuterol (PROVENTIL HFA;VENTOLIN HFA) 108 (90 BASE) MCG/ACT inhaler Inhale 2 puffs into the lungs every 6 (six) hours as needed for wheezing or shortness of breath. 1 Inhaler 2  . docusate sodium (COLACE) 100 MG capsule Take 100 mg by mouth 2 (two) times daily.    . Iron-Vitamins (GERITOL PO) Take 1 tablet by mouth daily.    [name] lidocaine-prilocaine (EMLA) cream Apply to port site one hour before treatment and cover with plastic wrap. 1 each 2  . LORazepam (ATIVAN) 0.5 MG tablet Take 1 tablet (0.5 mg total) by mouth 2 (two) times daily. for anxiety 60 tablet 3  . mirtazapine (REMERON) 30 MG tablet Take 1 tablet (30 mg total) by mouth at bedtime. 90 tablet 1  . morphine (MS CONTIN) 30 MG 12 hr tablet Take 1 tablet (30 mg total) by mouth every 12 (twelve) hours. 60 tablet 0  . omeprazole  (PRILOSEC) 40 MG capsule Take 40 mg by mouth daily.     . ondansetron (ZOFRAN) 8 MG tablet Take 8 mg by mouth every 8 (eight) hours as needed for nausea or vomiting (nausea).     [name] oxyCODONE (OXY IR/ROXICODONE) 5 MG immediate release tablet Take 1 tablet (5 mg total) by mouth every 6 (six) hours as needed for severe pain. 120 tablet 0  . polyethylene glycol powder (MIRALAX) powder Take 17 g by mouth daily. 255 g 0  . rivaroxaban (XARELTO) 20 MG TABS tablet Take 1 tablet (20 mg total) by mouth daily with supper. 30 tablet 1  . tiotropium (SPIRIVA HANDIHALER) 18 MCG inhalation capsule Place 1 capsule (18 mcg total) into inhaler and inhale daily. 90 capsule 1  . vitamin B-12 (CYANOCOBALAMIN) 100 MCG tablet Take 100 mcg by mouth daily.    . meclizine (ANTIVERT) 25 MG tablet TAKE ONE TABLET BY MOUTH THREE TIMES DAILY AS NEEDED FOR DIZZINESS (Patient not taking: Reported on 06/16/2014) 30 tablet 1  . nystatin (MYCOSTATIN) 100000 UNIT/ML suspension Use as directed 5 mLs in the mouth or throat 4 (four) times daily as needed (after chemo).     . predniSONE (DELTASONE) 10 MG tablet Take 1 tablet (10 mg total) by mouth daily with breakfast. (Patient not taking: Reported on 05/01/2014) 30 tablet 1  . prochlorperazine (COMPAZINE) 10 MG tablet Take  10 mg by mouth every 6 (six) hours as needed for nausea or vomiting (nausea).      No current facility-administered medications for this encounter.   Facility-Administered Medications Ordered in Other Encounters  Medication Dose Route Frequency Provider Last Rate Last Dose  . 0.9 %  sodium chloride infusion  1,000 mL Intravenous Once Concha Norway, MD      . 0.9 %  sodium chloride infusion   Intravenous Once Concha Norway, MD        Physical Exam:  Filed Vitals:   06/16/14 1642  BP: 106/73  Pulse: 65  Resp: 16  Weight: 107 lb 1.6 oz (48.58 kg)  SpO2: 98%   Appears cachetic but alert. Normal respiratory effort. Nasal cannula in place.   IMPRESSION: Ricky Villanueva is a 64  y.o. male s/p palliative RT to the chest and brain with possible progression in the chest  PLAN:   I discussed his scans with him.  The chest consolidation is worrisome for progression but whether we should attempt re-irradiation versus further systemic therapy versus observation is unknown.  I would favor local radiation if we did anything and I discussed that with him.  I will wait to discuss with Dr. [name]. He is currently asymptomatic and holding his own. I would like to see what Dr. [name] thinks after meeting with them on Monday. I will follow up this brain lesion with an MRI in 6 weeks.  I refilled his oxycodone and his ativan which he is taking twice a day for right chest pain as well as his MSContin.   This document serves as a record of services personally performed by [name] , MD. It was created on her behalf by Pearlie Oyster, a trained medical scribe. The creation of this record is based on the scribe's personal observations and the provider's statements to them. This document has been checked and approved by the attending provider.      [name], MD

## 2014-06-14 NOTE — Telephone Encounter (Signed)
TC from pt;s wife, [name], requesting refill on MSContin 30 mg 1 every 12 hours. Has enough through Saturday. Will pick up script before 4pm tomorrow, 06/15/14

## 2014-06-15 ENCOUNTER — Encounter (HOSPITAL_COMMUNITY): Payer: Self-pay

## 2014-06-15 ENCOUNTER — Ambulatory Visit
Admission: RE | Admit: 2014-06-15 | Discharge: 2014-06-15 | Disposition: A | Discharge status: Home / Self Care | Payer: BLUE CROSS/BLUE SHIELD | Source: Ambulatory Visit | Attending: Radiation Oncology | Admitting: Radiation Oncology

## 2014-06-15 ENCOUNTER — Ambulatory Visit (HOSPITAL_COMMUNITY)
Admission: RE | Admit: 2014-06-15 | Discharge: 2014-06-15 | Disposition: A | Discharge status: Home / Self Care | Payer: BLUE CROSS/BLUE SHIELD | Source: Ambulatory Visit | Attending: Radiation Oncology | Admitting: Radiation Oncology

## 2014-06-15 DIAGNOSIS — C7931 Secondary malignant neoplasm of brain: Secondary | ICD-10-CM | POA: Diagnosis not present

## 2014-06-15 DIAGNOSIS — Z923 Personal history of irradiation: Secondary | ICD-10-CM | POA: Diagnosis not present

## 2014-06-15 DIAGNOSIS — Z9221 Personal history of antineoplastic chemotherapy: Secondary | ICD-10-CM | POA: Diagnosis not present

## 2014-06-15 DIAGNOSIS — C3490 Malignant neoplasm of unspecified part of unspecified bronchus or lung: Secondary | ICD-10-CM

## 2014-06-15 LAB — BUN AND CREATININE (CC13)
BUN: 6.9 mg/dL — ABNORMAL LOW (ref 7.0–26.0)
CREATININE: 0.8 mg/dL (ref 0.7–1.3)
EGFR: >90 mL/min/{1.73_m2} (ref >90)

## 2014-06-15 MED ORDER — IOHEXOL 300 MG/ML  SOLN
100.0000 mL | Freq: Once | INTRAMUSCULAR | Status: AC | PRN
  Administered 2014-06-15: 100 mL via INTRAVENOUS

## 2014-06-15 MED ORDER — IOHEXOL 300 MG/ML  SOLN
50.0000 mL | Freq: Once | INTRAMUSCULAR | Status: AC | PRN
  Administered 2014-06-15: 50 mL via ORAL

## 2014-06-15 MED ORDER — GADOBENATE DIMEGLUMINE 529 MG/ML IV SOLN
10.0000 mL | Freq: Once | INTRAVENOUS | Status: AC | PRN
  Administered 2014-06-15: 9 mL via INTRAVENOUS

## 2014-06-15 NOTE — Telephone Encounter (Signed)
This was written & wife notified.

## 2014-06-16 ENCOUNTER — Ambulatory Visit
Admission: RE | Admit: 2014-06-16 | Discharge: 2014-06-16 | Disposition: A | Discharge status: Home / Self Care | Payer: BLUE CROSS/BLUE SHIELD | Source: Ambulatory Visit | Attending: Radiation Oncology | Admitting: Radiation Oncology

## 2014-06-16 ENCOUNTER — Encounter: Payer: Self-pay | Admitting: Radiation Oncology

## 2014-06-16 VITALS — BP 106/73 | HR 65 | Resp 16 | Wt 107.1 lb

## 2014-06-16 DIAGNOSIS — K1230 Oral mucositis (ulcerative), unspecified: Secondary | ICD-10-CM

## 2014-06-16 DIAGNOSIS — R918 Other nonspecific abnormal finding of lung field: Secondary | ICD-10-CM

## 2014-06-16 DIAGNOSIS — C3490 Malignant neoplasm of unspecified part of unspecified bronchus or lung: Secondary | ICD-10-CM

## 2014-06-16 DIAGNOSIS — M545 Low back pain, unspecified: Secondary | ICD-10-CM

## 2014-06-16 MED ORDER — OXYCODONE HCL 5 MG PO TABS: 5.0000 mg | ORAL_TABLET | Freq: Four times a day (QID) | ORAL | Status: DC | PRN

## 2014-06-16 MED ORDER — LORAZEPAM 0.5 MG PO TABS: 0.5000 mg | ORAL_TABLET | Freq: Two times a day (BID) | ORAL | Status: DC

## 2014-06-16 NOTE — Progress Notes (Signed)
Pain well managed with morphine and oxycodone. Denies pain at this time. Oxygen therapy 2 liters via nasal cannula noted. Reports at rest he doesn't require oxygen therapy. Reports vertigo is seldom and mild. 2 lb weight gain noted since January. Reports appetite has improved but, taste for most foods has not returned. Reports productive cough with thick yellow sputum. Denies hemoptysis. Denies difficulty swallowing. Reports he has more days now he has energy to do the things he wants to than he once did. Vitals stable.

## 2014-06-19 ENCOUNTER — Ambulatory Visit (HOSPITAL_BASED_OUTPATIENT_CLINIC_OR_DEPARTMENT_OTHER): Payer: BLUE CROSS/BLUE SHIELD | Admitting: Internal Medicine

## 2014-06-19 ENCOUNTER — Telehealth: Payer: Self-pay | Admitting: Internal Medicine

## 2014-06-19 ENCOUNTER — Encounter: Payer: Self-pay | Admitting: Internal Medicine

## 2014-06-19 VITALS — BP 101/75 | HR 63 | Temp 98.0°F | Resp 18 | Ht 67.0 in | Wt 105.4 lb

## 2014-06-19 DIAGNOSIS — Q441 Other congenital malformations of gallbladder: Secondary | ICD-10-CM | POA: Diagnosis not present

## 2014-06-19 DIAGNOSIS — J984 Other disorders of lung: Secondary | ICD-10-CM | POA: Diagnosis not present

## 2014-06-19 DIAGNOSIS — C7A1 Malignant poorly differentiated neuroendocrine tumors: Secondary | ICD-10-CM

## 2014-06-19 DIAGNOSIS — R630 Anorexia: Secondary | ICD-10-CM

## 2014-06-19 DIAGNOSIS — C3491 Malignant neoplasm of unspecified part of right bronchus or lung: Secondary | ICD-10-CM

## 2014-06-19 DIAGNOSIS — C7B8 Other secondary neuroendocrine tumors: Secondary | ICD-10-CM

## 2014-06-19 NOTE — Progress Notes (Signed)
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  PRIOR THERAPY: 1. First-line chemotherapy with 5 cycles of carboplatin (cisplatin for the first cycle) and etoposide from 05/18/2013 to 10/15/2013.  2. WB XRT and palliative chest radiation given on 05/03/13 - 05/18/13 per Dr. Pablo Ledger.  CURRENT THERAPY: Observation.   INTERVAL HISTORY: Ricky Villanueva 64 y.o. male returns to the clinic today for follow-up visit accompanied by his wife and daughter. He is a former patient of Dr. Juliann Mule, Dr. Lona Kettle and Dr. Burr Medico. He came today to establish care with me. The patient was diagnosed with a small cell lung cancer in March 2015 and received 6 cycles of systemic chemotherapy as well as palliative radiotherapy to the chest and brain under the care of Dr. Pablo Ledger. His chemotherapy was completed in August 2015 and he has been observation since that time. He continues to complain of lack of taste and poor appetite. She currently received IV hydration at home once weekly. He denied having any significant chest pain, shortness of breath, cough or hemoptysis. The patient has no nausea or vomiting, no fever or chills. He lost a lot of weight initially but not over the last few months. He had a recent MRI of the brain as well as CT scan of the chest, abdomen and pelvis and the patient is here today for evaluation and discussion of his scan results.  MEDICAL HISTORY: Past Medical History  Diagnosis Date  . Hyperlipidemia   . Hypertension   . GERD (gastroesophageal reflux disease)   . Respiratory failure with hypoxia 04/21/2013    secondary to pneumonia/notes 04/21/2013  . Pulmonary embolism     "got one in there now" (04/21/2013)  . COPD (chronic obstructive pulmonary disease)   . Pneumonia  2/?01/2014    "wouldn't get better; hospitalized 04/21/2013)  . Arthritis     "minor in my right hand" (04/21/2013)  . History of radiation therapy 05/03/13-05/18/13    chest & whole brain  . Cancer     lung ca dx'd 03/2013    ALLERGIES:  is allergic to decadron and augmentin.  MEDICATIONS:  Current Outpatient Prescriptions  Medication Sig Dispense Refill  . docusate sodium (COLACE) 100 MG capsule Take 100 mg by mouth 2 (two) times daily.    . Iron-Vitamins (GERITOL PO) Take 1 tablet by mouth daily.    [name] lidocaine-prilocaine (EMLA) cream Apply to port site one hour before treatment and cover with plastic wrap. 1 each 2  . LORazepam (ATIVAN) 0.5 MG tablet Take 1 tablet (0.5 mg total) by mouth 2 (two) times daily. for anxiety 60 tablet 3  . mirtazapine (REMERON) 30 MG tablet Take 1 tablet (30 mg total) by mouth at bedtime. 90 tablet 1  . morphine (MS CONTIN) 30 MG 12 hr tablet Take 1 tablet (30 mg total) by mouth every 12 (twelve) hours. 60 tablet 0  . omeprazole (PRILOSEC) 40 MG capsule Take 40 mg by mouth daily.     . ondansetron (ZOFRAN) 8 MG tablet Take 8 mg by mouth every 8 (eight) hours as needed for nausea or vomiting (nausea).     [name] oxyCODONE (OXY IR/ROXICODONE) 5 MG immediate release tablet Take 1 tablet (5 mg total) by mouth every 6 (six) hours as needed for severe pain. 120 tablet  0  . polyethylene glycol powder (MIRALAX) powder Take 17 g by mouth daily. 255 g 0  . prochlorperazine (COMPAZINE) 10 MG tablet Take 10 mg by mouth every 6 (six) hours as needed for nausea or vomiting (nausea).     . rivaroxaban (XARELTO) 20 MG TABS tablet Take 1 tablet (20 mg total) by mouth daily with supper. 30 tablet 1  . tiotropium (SPIRIVA HANDIHALER) 18 MCG inhalation capsule Place 1 capsule (18 mcg total) into inhaler and inhale daily. 90 capsule 1  . vitamin B-12 (CYANOCOBALAMIN) 100 MCG tablet Take 100 mcg by mouth daily.    [name] albuterol (PROVENTIL HFA;VENTOLIN HFA) 108 (90 BASE) MCG/ACT inhaler  Inhale 2 puffs into the lungs every 6 (six) hours as needed for wheezing or shortness of breath. (Patient not taking: Reported on 06/19/2014) 1 Inhaler 2   No current facility-administered medications for this visit.   Facility-Administered Medications Ordered in Other Visits  Medication Dose Route Frequency Provider Last Rate Last Dose  . 0.9 %  sodium chloride infusion  1,000 mL Intravenous Once Concha Norway, MD      . 0.9 %  sodium chloride infusion   Intravenous Once Concha Norway, MD        SURGICAL HISTORY:  Past Surgical History  Procedure Laterality Date  . Inguinal hernia repair Right ~ 2005  . Finger surgery Left ~ 1989    "crushed end of my middle finger off"   . Video bronchoscopy Bilateral 04/26/2013    Procedure: VIDEO BRONCHOSCOPY WITH FLUORO;  Surgeon: Wilhelmina Mcardle, MD;  Location: [facility] ENDOSCOPY;  Service: Cardiopulmonary;  Laterality: Bilateral;    REVIEW OF SYSTEMS:  Constitutional: positive for anorexia and fatigue Eyes: negative Ears, nose, mouth, throat, and face: negative Respiratory: positive for dyspnea on exertion Cardiovascular: negative Gastrointestinal: negative Genitourinary:negative Integument/breast: negative Hematologic/lymphatic: negative Musculoskeletal:negative Neurological: negative Behavioral/Psych: negative Endocrine: negative Allergic/Immunologic: negative   PHYSICAL EXAMINATION: General appearance: alert, cooperative, fatigued and no distress Head: Normocephalic, without obvious abnormality, atraumatic Neck: no adenopathy, no JVD, supple, symmetrical, trachea midline and thyroid not enlarged, symmetric, no tenderness/mass/nodules Lymph nodes: Cervical, supraclavicular, and axillary nodes normal. Resp: clear to auscultation bilaterally Back: symmetric, no curvature. ROM normal. No CVA tenderness. Cardio: regular rate and rhythm, S1, S2 normal, no murmur, click, rub or gallop GI: soft, non-tender; bowel sounds normal; no masses,  no  organomegaly Extremities: extremities normal, atraumatic, no cyanosis or edema Neurologic: Alert and oriented X 3, normal strength and tone. Normal symmetric reflexes. Normal coordination and gait  ECOG PERFORMANCE STATUS: 1 - Symptomatic but completely ambulatory  Blood pressure 101/75, pulse 63, temperature 98 F (36.7 C), temperature source Oral, resp. rate 18, height '5\' 7"'$  (1.702 m), weight 105 lb 6.4 oz (47.809 kg), SpO2 96 %.  LABORATORY DATA: Lab Results  Component Value Date   WBC 6.4 05/01/2014   HGB 12.6* 05/01/2014   HCT 39.1 05/01/2014   MCV 99.1* 05/01/2014   PLT 229 05/01/2014      Chemistry      Component Value Date/Time   NA 141 05/01/2014 1257   NA 139 01/06/2014 1352   K 4.2 05/01/2014 1257   K 4.3 01/06/2014 1352   CL 100 01/06/2014 1352   CO2 30* 05/01/2014 1257   CO2 25 01/06/2014 1352   BUN 6.9* 06/15/2014 0923   BUN 10 01/06/2014 1352   CREATININE 0.8 06/15/2014 0923   CREATININE 0.73 01/06/2014 1352      Component Value Date/Time   CALCIUM 9.1 05/01/2014  1257   CALCIUM 9.7 01/06/2014 1352   ALKPHOS 60 05/01/2014 1257   ALKPHOS 70 05/26/2013 0405   AST 14 05/01/2014 1257   AST 12 05/26/2013 0405   ALT 7 05/01/2014 1257   ALT 12 05/26/2013 0405   BILITOT 0.45 05/01/2014 1257   BILITOT 0.5 05/26/2013 0405       RADIOGRAPHIC STUDIES: Ct Chest W Contrast  06/15/2014   CLINICAL DATA:  Restaging lung cancer. History of brain metastasis. Patient is status post chemotherapy and radiation therapy.  EXAM: CT CHEST, ABDOMEN, AND PELVIS WITH CONTRAST  TECHNIQUE: Multidetector CT imaging of the chest, abdomen and pelvis was performed following the standard protocol during bolus administration of intravenous contrast.  CONTRAST:  53m OMNIPAQUE IOHEXOL 300 MG/ML SOLN, 1067mOMNIPAQUE IOHEXOL 300 MG/ML SOLN  COMPARISON:  PET-CT 03/13/2014 in chest CT 01/06/2014  FINDINGS: CT CHEST FINDINGS  Chest wall: No chest wall mass, supraclavicular or axillary  lymphadenopathy. The thyroid gland appears normal. The right Port-A-Cath is stable. No destructive bone lesions or spinal canal compromise.  Mediastinum: The heart is normal in size. No pericardial effusion. No mediastinal or hilar mass or lymphadenopathy. The aorta is normal in caliber. No dissection. Coronary artery calcifications are noted. The esophagus is grossly normal. No measurable lymphadenopathy. This is a matted soft tissue density in the sub carinal area measuring a maximum of 14 mm. This appears relatively stable.  Lungs/ pleura: Extensive radiation changes again demonstrated in the right paramediastinal along with areas of subpleural atelectasis and scarring. The right lower lobe pulmonary lesion appears larger than on the prior PET-CT and chest CT. It previously measured 22 x 7 mm and now measures 26 x 14.5 mm. This is worrisome for recurrent tumor. There is also another nodular density slightly more superiorly and anteriorly on image number 46 which measures 19.5 x 13.5 mm. This measured 17 x 17.5 mm on the prior study. Stable advanced emphysematous changes. No metastatic pulmonary nodules are identified.  CT ABDOMEN AND PELVIS FINDINGS  Hepatobiliary: No focal hepatic lesions or intrahepatic biliary dilatation. The gallbladder is normal. The gallbladder is normal. There is persistent common bile duct dilatation without obvious cause. Possible distal common bile duct stricture. No obvious pancreatic head mass. Recommend correlation with liver function studies. I do not see any biliary dilatation on the study from March 2015. ERCP or MRCP may be helpful for further evaluation.  Pancreas: No pancreatic mass or acute inflammatory changes. Moderate atrophy.  Spleen:  Normal size.  No splenic lesions.  Adrenal/kidneys: The adrenal glands and kidneys are unremarkable. Small renal cysts are noted. No worrisome renal lesions. No hydronephrosis.  Stomach/bowel: The stomach, duodenum, small bowel and colon are  unremarkable. No inflammatory changes, mass lesions or obstructive findings. The terminal ileum is normal. The appendix is normal.  Vascular/ lymphatic: The aorta and branch vessels are patent. Moderate atherosclerotic calcifications. The major venous structures are patent. No mesenteric or retroperitoneal mass or adenopathy. Small scattered lymph nodes are stable.  Pelvis: The bladder is mildly distended. The prostate gland and seminal vesicles are normal. No pelvic mass or adenopathy. No free pelvic fluid collections. No inguinal mass or adenopathy.  Musculoskeletal: No lytic or sclerotic bone lesions to suggest metastatic disease.  IMPRESSION: 1. Enlarging right lower lobe lung lesions worrisome for recurrent tumor. The repeat PET-CT may be helpful for further evaluation. 2. Stable emphysematous changes and right lung radiation fibrosis. 3. No enlarged mediastinal or hilar lymph nodes. Stable low-attenuation material in the subcarinal space.  4. Unexplained common bowel duct dilatation. Could not exclude a distal common bile duct stone or ampullary lesion. Recommend correlation with liver function studies. ERCP or MRCP may be helpful for further evaluation.   Electronically Signed   By: Marijo Sanes M.D.   On: 06/15/2014 15:23   Mr Jeri Cos BU Contrast  06/15/2014   CLINICAL DATA:  Small cell carcinoma lung. Rule out metastatic disease.  EXAM: MRI HEAD WITHOUT AND WITH CONTRAST  TECHNIQUE: Multiplanar, multiecho pulse sequences of the brain and surrounding structures were obtained without and with intravenous contrast.  CONTRAST:  17m MULTIHANCE GADOBENATE DIMEGLUMINE 529 MG/ML IV SOLN  COMPARISON:  MRI head 03/13/2014, 12/05/2013  FINDINGS: Previously noted right pontine lesion had resolved on prior studies. On today's study there is a small amount of hemosiderin in the right ventral midbrain which shows slight enhancement. This area of enhancement is subtle and measures approximately 2 x 4 mm. This is in the  area of the enhancing mass on 04/29/2013 and could be recurrent tumor. This could also be treated stable tumor.  No other enhancing lesions identified to suggest recurrence.  Generalized atrophy. Chronic white matter hyperintensity is stable compatible with radiation change and possibly microvascular ischemia. Negative for acute infarct.  Paranasal sinuses are clear.  IMPRESSION: Questionable area of enhancement in the right anterior midbrain measuring 2 x 4 mm with some mild associated hemorrhage. This is the site of a previously noted metastatic deposit which had resolved on prior MRI scans. This could be local recurrence however this is a subtle finding and will require close followup. Consider stereotactic radiosurgery protocol at 3 Tesla to evaluate this in more detail, versus short-term interval follow-up. No other new lesions identified.   Electronically Signed   By: CFranchot GalloM.D.   On: 06/15/2014 13:12   Ct Abdomen Pelvis W Contrast  06/15/2014   CLINICAL DATA:  Restaging lung cancer. History of brain metastasis. Patient is status post chemotherapy and radiation therapy.  EXAM: CT CHEST, ABDOMEN, AND PELVIS WITH CONTRAST  TECHNIQUE: Multidetector CT imaging of the chest, abdomen and pelvis was performed following the standard protocol during bolus administration of intravenous contrast.  CONTRAST:  558mOMNIPAQUE IOHEXOL 300 MG/ML SOLN, 10048mMNIPAQUE IOHEXOL 300 MG/ML SOLN  COMPARISON:  PET-CT 03/13/2014 in chest CT 01/06/2014  FINDINGS: CT CHEST FINDINGS  Chest wall: No chest wall mass, supraclavicular or axillary lymphadenopathy. The thyroid gland appears normal. The right Port-A-Cath is stable. No destructive bone lesions or spinal canal compromise.  Mediastinum: The heart is normal in size. No pericardial effusion. No mediastinal or hilar mass or lymphadenopathy. The aorta is normal in caliber. No dissection. Coronary artery calcifications are noted. The esophagus is grossly normal. No  measurable lymphadenopathy. This is a matted soft tissue density in the sub carinal area measuring a maximum of 14 mm. This appears relatively stable.  Lungs/ pleura: Extensive radiation changes again demonstrated in the right paramediastinal along with areas of subpleural atelectasis and scarring. The right lower lobe pulmonary lesion appears larger than on the prior PET-CT and chest CT. It previously measured 22 x 7 mm and now measures 26 x 14.5 mm. This is worrisome for recurrent tumor. There is also another nodular density slightly more superiorly and anteriorly on image number 46 which measures 19.5 x 13.5 mm. This measured 17 x 17.5 mm on the prior study. Stable advanced emphysematous changes. No metastatic pulmonary nodules are identified.  CT ABDOMEN AND PELVIS FINDINGS  Hepatobiliary: No focal hepatic  lesions or intrahepatic biliary dilatation. The gallbladder is normal. The gallbladder is normal. There is persistent common bile duct dilatation without obvious cause. Possible distal common bile duct stricture. No obvious pancreatic head mass. Recommend correlation with liver function studies. I do not see any biliary dilatation on the study from March 2015. ERCP or MRCP may be helpful for further evaluation.  Pancreas: No pancreatic mass or acute inflammatory changes. Moderate atrophy.  Spleen:  Normal size.  No splenic lesions.  Adrenal/kidneys: The adrenal glands and kidneys are unremarkable. Small renal cysts are noted. No worrisome renal lesions. No hydronephrosis.  Stomach/bowel: The stomach, duodenum, small bowel and colon are unremarkable. No inflammatory changes, mass lesions or obstructive findings. The terminal ileum is normal. The appendix is normal.  Vascular/ lymphatic: The aorta and branch vessels are patent. Moderate atherosclerotic calcifications. The major venous structures are patent. No mesenteric or retroperitoneal mass or adenopathy. Small scattered lymph nodes are stable.  Pelvis: The  bladder is mildly distended. The prostate gland and seminal vesicles are normal. No pelvic mass or adenopathy. No free pelvic fluid collections. No inguinal mass or adenopathy.  Musculoskeletal: No lytic or sclerotic bone lesions to suggest metastatic disease.  IMPRESSION: 1. Enlarging right lower lobe lung lesions worrisome for recurrent tumor. The repeat PET-CT may be helpful for further evaluation. 2. Stable emphysematous changes and right lung radiation fibrosis. 3. No enlarged mediastinal or hilar lymph nodes. Stable low-attenuation material in the subcarinal space. 4. Unexplained common bowel duct dilatation. Could not exclude a distal common bile duct stone or ampullary lesion. Recommend correlation with liver function studies. ERCP or MRCP may be helpful for further evaluation.   Electronically Signed   By: Marijo Sanes M.D.   On: 06/15/2014 15:23    ASSESSMENT AND PLAN: This is a very pleasant 64 years old white male with extensive stage small cell lung cancer status post palliative radiotherapy to the brain and chest as well as 6 cycles of systemic chemotherapy with carboplatin and etoposide completed in August 2015. The patient has been observation for the last 8 months. His recent MRI of the brain showed questionable area of enhancement in the right anterior midbrain measuring 2 x 4 mm. he was seen by Dr. Pablo Ledger and currently on observation. The CT scan of the chest, abdomen and pelvis showed mild enlargement of a right lower lobe lesion concerning for recurrent disease but it's also located in the area of radiation fibrosis. I discussed the scan results and showed the images to the patient and his family. I gave him the option of resuming systemic chemotherapy versus close monitoring and repeat CT scan of the chest, abdomen and pelvis in 2 months for reevaluation of this lesion and if it continues to shows evidence for progression, I would start the patient on systemic chemotherapy  again. The patient and his family would like to continue on observation for now. He was advised to call immediately if he has any worsening symptoms especially chest pain, shortness of breath, cough or hemoptysis. For the bile duct dilatation, I would refer the patient back to his gastroenterologist Dr. Henrene Pastor for reevaluation. The patient was advised to call if he has any concerning symptoms in the interval. The patient voices understanding of current disease status and treatment options and is in agreement with the current care plan.  All questions were answered. The patient knows to call the clinic with any problems, questions or concerns. We can certainly see the patient much sooner if necessary.  I spent 20 minutes counseling the patient face to face. The total time spent in the appointment was 30 minutes.  Disclaimer: This note was dictated with voice recognition software. Similar sounding words can inadvertently be transcribed and may not be corrected upon review.

## 2014-06-19 NOTE — Telephone Encounter (Signed)
s.w. pt wife and advised on June appt....ok and aware °

## 2014-06-20 ENCOUNTER — Encounter: Payer: Self-pay | Admitting: Internal Medicine

## 2014-06-28 ENCOUNTER — Telehealth: Payer: Self-pay | Admitting: *Deleted

## 2014-06-28 NOTE — Telephone Encounter (Signed)
Received message from [place], [place] @ [facility] needing order for recertification ( 60 day recertification ) for pt to receive weekly IVF at home. [facility]  Phone   [phone].

## 2014-07-02 ENCOUNTER — Other Ambulatory Visit: Payer: Self-pay | Admitting: Internal Medicine

## 2014-07-12 ENCOUNTER — Telehealth: Payer: Self-pay | Admitting: *Deleted

## 2014-07-12 ENCOUNTER — Other Ambulatory Visit: Payer: Self-pay | Admitting: Medical Oncology

## 2014-07-12 ENCOUNTER — Telehealth: Payer: Self-pay | Admitting: Medical Oncology

## 2014-07-12 NOTE — Telephone Encounter (Signed)
I told wife to call PCP for Spiriva refill.

## 2014-07-12 NOTE — Telephone Encounter (Signed)
VM @ 1:05 pm received from daughter, [name] regarding spiriva refills.  Call back and spoke with [name]. She states she has already spoken with another nurse here and they are working on getting these refills. Pt's PCP is no longer at the practice.

## 2014-07-13 ENCOUNTER — Telehealth: Payer: Self-pay | Admitting: Medical Oncology

## 2014-07-13 ENCOUNTER — Other Ambulatory Visit: Payer: Self-pay | Admitting: Medical Oncology

## 2014-07-13 ENCOUNTER — Telehealth: Payer: Self-pay | Admitting: *Deleted

## 2014-07-13 DIAGNOSIS — M545 Low back pain, unspecified: Secondary | ICD-10-CM

## 2014-07-13 DIAGNOSIS — K1230 Oral mucositis (ulcerative), unspecified: Secondary | ICD-10-CM

## 2014-07-13 DIAGNOSIS — R918 Other nonspecific abnormal finding of lung field: Secondary | ICD-10-CM

## 2014-07-13 DIAGNOSIS — C3490 Malignant neoplasm of unspecified part of unspecified bronchus or lung: Secondary | ICD-10-CM

## 2014-07-13 MED ORDER — TIOTROPIUM BROMIDE MONOHYDRATE 18 MCG IN CAPS: 18.0000 ug | ORAL_CAPSULE | Freq: Every day | RESPIRATORY_TRACT | Status: DC

## 2014-07-13 MED ORDER — OXYCODONE HCL 5 MG PO TABS: 5.0000 mg | ORAL_TABLET | Freq: Four times a day (QID) | ORAL | Status: DC | PRN

## 2014-07-13 MED ORDER — MORPHINE SULFATE ER 30 MG PO TBCR: 30.0000 mg | EXTENDED_RELEASE_TABLET | Freq: Two times a day (BID) | ORAL | Status: DC

## 2014-07-13 NOTE — Telephone Encounter (Signed)
[name] notified that Spiriva called in and to establish with PCP.

## 2014-07-13 NOTE — Telephone Encounter (Signed)
Pt.notified

## 2014-07-13 NOTE — Telephone Encounter (Signed)
-----   Message from Curt Bears, MD sent at 07/12/2014  5:50 PM EDT ----- [place] with one time refill until he establish care with PCP.  ----- Message -----    From: Ardeen Garland, RN    Sent: 07/12/2014   2:50 PM      To: Curt Bears, MD  He needs refill on spirva. Last filled by [facility]. I recommended they contact PCP for refill but will he will not be seeing him.  Do you recommend he get a PCP ? If so they want a recommendation.   Can I refill spiriva this time?

## 2014-07-13 NOTE — Telephone Encounter (Signed)
Call received requesting pain and anxiety medicine refills.  Last refills were received from [facility] while in transition to Dr. [name].  [name] asked to pick these up today or tomorrow. He will run out tomorrow.  Morphine, lorazepam were filled 06-14-2014 and oxycodone filled 06-16-2014.  First visit with Dr, [name] was 06-19-2014.  Please call Ricky Villanueva [phone] when ready for pick up.

## 2014-07-13 NOTE — Telephone Encounter (Signed)
RX locked up and daughter notified. I told her he had refill son lorazepam.

## 2014-07-17 ENCOUNTER — Other Ambulatory Visit (INDEPENDENT_AMBULATORY_CARE_PROVIDER_SITE_OTHER): Payer: BLUE CROSS/BLUE SHIELD

## 2014-07-17 ENCOUNTER — Ambulatory Visit (INDEPENDENT_AMBULATORY_CARE_PROVIDER_SITE_OTHER): Payer: BLUE CROSS/BLUE SHIELD | Admitting: Internal Medicine

## 2014-07-17 ENCOUNTER — Encounter: Payer: Self-pay | Admitting: Internal Medicine

## 2014-07-17 VITALS — BP 108/66 | HR 67 | Ht 67.0 in | Wt 108.0 lb

## 2014-07-17 DIAGNOSIS — K838 Other specified diseases of biliary tract: Secondary | ICD-10-CM | POA: Diagnosis not present

## 2014-07-17 DIAGNOSIS — K5901 Slow transit constipation: Secondary | ICD-10-CM

## 2014-07-17 DIAGNOSIS — R935 Abnormal findings on diagnostic imaging of other abdominal regions, including retroperitoneum: Secondary | ICD-10-CM | POA: Diagnosis not present

## 2014-07-17 DIAGNOSIS — K219 Gastro-esophageal reflux disease without esophagitis: Secondary | ICD-10-CM

## 2014-07-17 LAB — HEPATIC FUNCTION PANEL
ALBUMIN: 3.8 g/dL (ref 3.5–5.2)
ALK PHOS: 64 U/L (ref 39–117)
ALT: 8 U/L (ref 0–53)
AST: 15 U/L (ref 0–37)
BILIRUBIN TOTAL: 0.2 mg/dL (ref 0.2–1.2)
Bilirubin, Direct: 0.1 mg/dL (ref 0.0–0.3)
Total Protein: 6.7 g/dL (ref 6.0–8.3)

## 2014-07-17 NOTE — Progress Notes (Signed)
HISTORY OF PRESENT ILLNESS:  Ricky Villanueva is a 64 y.o. 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 He is sent today by Dr. Earlie Server regarding abnormal CT scan of the abdomen. Outside x-rays, laboratories, and office notes reviewed personally. CT of the chest abdomen and pelvis was performed 06/15/2014 for restaging of his cancer. Area of concern was the right lower lung which is being followed currently. He was also noted to have common bile duct dilation without intrahepatic ductal dilation. He is sent for this reason. Patient denies abdominal pain or jaundice. Liver function tests have been normal. Most recent liver tests 05/01/2014. Since his visit 1 year ago he has gained several pounds. He is accompanied today by multiple family members. His GI review of systems is remarkable for chronic constipation related to narcotics. He does take MiraLAX. Sometimes not effective. Also, occasionally noticed some red blood on the tissue when passing a hard stool. He did have colonoscopy June 2014 elsewhere with diminutive rectal polyp only. He continues on PPI for GERD. No dysphagia  REVIEW OF SYSTEMS:  All non-GI ROS negative except for anxiety, arthritis, cough, shortness of breath, voice change  Past Medical History  Diagnosis Date  . Hyperlipidemia   . Hypertension   . GERD (gastroesophageal reflux disease)   . Respiratory failure with hypoxia 04/21/2013    secondary to pneumonia/notes 04/21/2013  . Pulmonary embolism     "got one in there now" (04/21/2013)  . COPD (chronic obstructive pulmonary disease)   . Pneumonia 2/?01/2014    "wouldn't get better; hospitalized 04/21/2013)  . Arthritis     "minor in my right hand" (04/21/2013)  . History of radiation  therapy 05/03/13-05/18/13    chest & whole brain  . Cancer     lung ca dx'd 03/2013  . Colon polyps     hyperplastic    Past Surgical History  Procedure Laterality Date  . Inguinal hernia repair Right ~ 2005  . Finger surgery Left ~ 1989    "crushed end of my middle finger off"   . Video bronchoscopy Bilateral 04/26/2013    Procedure: VIDEO BRONCHOSCOPY WITH FLUORO;  Surgeon: Wilhelmina Mcardle, MD;  Location: [facility] ENDOSCOPY;  Service: Cardiopulmonary;  Laterality: Bilateral;    Social History Ricky Villanueva  reports that he quit smoking about 2 years ago. His smoking use included Cigarettes. He has a 94 pack-year smoking history. He has never used smokeless tobacco. He reports that he does not drink alcohol or use illicit drugs.  family history includes Breast cancer in his sister; Diabetes in his father; Emphysema in his mother.  Allergies  Allergen Reactions  . Decadron [Dexamethasone] Other (See Comments)    Slurred speech  . Augmentin [Amoxicillin-Pot Clavulanate] Itching and Rash    rash       PHYSICAL EXAMINATION: Vital signs: BP 108/66 mmHg  Pulse 67  Ht '5\' 7"'$  (1.702 m)  Wt 108 lb (48.988 kg)  BMI 16.91 kg/m2 General: Chronically ill thin male, no acute distress. Nasal cannula oxygen in place HEENT: Sclerae are anicteric, conjunctiva pink. Oral mucosa intact Lungs: Clear Heart: Regular Abdomen: soft, nontender, ticklish, nondistended, no obvious ascites, no peritoneal signs, normal bowel sounds. No organomegaly. Extremities: No edema Psychiatric: alert and oriented x3. Cooperative   ASSESSMENT:  #1. Abnormal CT  scan of the abdomen. Isolated extrahepatic common duct dilation of uncertain clinical significance. Asymptomatic with normal liver tests. #2. Narcotic related constipation #3. Multiple significant medical problems including a history of metastatic lung cancer, end-stage COPD, and chronic anticoagulation #4. GERD. Stable on PPI   PLAN:  #1. Repeat liver  tests today. We would only intervene or further evaluate the bile duct should the patient develop significant liver test abnormalities or jaundice. #2. Counseled regarding titration of MiraLAX for constipation #3. Continue PPI for GERD and protection of upper GI mucosa on xeralto #4. Ongoing oncologic care with Dr. Earlie Server  A copy has been sent to Dr. Earlie Server

## 2014-07-17 NOTE — Patient Instructions (Signed)
Your physician has requested that you go to the basement for the following lab work before leaving today: Hepatic function panel Continue your miralax as directed. CC:  Orpah Melter MD

## 2014-07-27 ENCOUNTER — Telehealth: Payer: Self-pay | Admitting: *Deleted

## 2014-07-27 NOTE — Telephone Encounter (Signed)
VM message from [facility] @ [facility] requesting new order for Lactated Ringers -weekly infusion.  Please call @ [phone] or fax order to [phone]

## 2014-07-27 NOTE — Telephone Encounter (Signed)
Called Coretta about reason for LR. Coretta will call home health nurse to evaluate pt need for LR.

## 2014-08-01 ENCOUNTER — Encounter: Payer: Self-pay | Admitting: Radiation Oncology

## 2014-08-01 NOTE — Progress Notes (Signed)
Received [employer] form, forwarded to RN for processing

## 2014-08-04 ENCOUNTER — Telehealth: Payer: Self-pay | Admitting: Radiation Oncology

## 2014-08-04 NOTE — Telephone Encounter (Signed)
Mailing originals of the [employer] Accelerated Death Benefit Assignee consent form to Mr. Lanum. I faxed them to [place] # [phone] and rec'd a confirmation. Copies will be scanned into EPIC.

## 2014-08-07 ENCOUNTER — Telehealth: Payer: Self-pay

## 2014-08-07 ENCOUNTER — Telehealth: Payer: Self-pay | Admitting: Medical Oncology

## 2014-08-07 NOTE — Telephone Encounter (Signed)
Prior auth  For CT- left message for linda to see if I need to do anything.

## 2014-08-08 NOTE — Progress Notes (Signed)
Accelerated benefit form completed.page 12 signed and faxed to [facility] to attention of Lieutenant Diego [phone].

## 2014-08-10 ENCOUNTER — Telehealth: Payer: Self-pay | Admitting: *Deleted

## 2014-08-10 NOTE — Telephone Encounter (Signed)
Spouse [name] called requesting "prescriptions for Morphine and Oxycodone.  Would like return call from nurse tomorrow.  Ricky Villanueva is scheduled for scans Monday and can pick prescriptions up Monday."

## 2014-08-10 NOTE — Telephone Encounter (Signed)
Please f/u

## 2014-08-11 ENCOUNTER — Other Ambulatory Visit: Payer: Self-pay | Admitting: *Deleted

## 2014-08-11 DIAGNOSIS — M545 Low back pain, unspecified: Secondary | ICD-10-CM

## 2014-08-11 DIAGNOSIS — R918 Other nonspecific abnormal finding of lung field: Secondary | ICD-10-CM

## 2014-08-11 DIAGNOSIS — K1230 Oral mucositis (ulcerative), unspecified: Secondary | ICD-10-CM

## 2014-08-11 DIAGNOSIS — C3490 Malignant neoplasm of unspecified part of unspecified bronchus or lung: Secondary | ICD-10-CM

## 2014-08-11 MED ORDER — OXYCODONE HCL 5 MG PO TABS: 5.0000 mg | ORAL_TABLET | Freq: Four times a day (QID) | ORAL | Status: DC | PRN

## 2014-08-11 MED ORDER — MORPHINE SULFATE ER 30 MG PO TBCR: 30.0000 mg | EXTENDED_RELEASE_TABLET | Freq: Two times a day (BID) | ORAL | Status: DC

## 2014-08-11 NOTE — Telephone Encounter (Signed)
Pt wife [name] notified Rx for pain meds ready for pick up on Monday after CT. [name] verbalized understanding.

## 2014-08-14 ENCOUNTER — Other Ambulatory Visit (HOSPITAL_BASED_OUTPATIENT_CLINIC_OR_DEPARTMENT_OTHER): Payer: BLUE CROSS/BLUE SHIELD

## 2014-08-14 ENCOUNTER — Ambulatory Visit (HOSPITAL_COMMUNITY)
Admission: RE | Admit: 2014-08-14 | Discharge: 2014-08-14 | Disposition: A | Discharge status: Home / Self Care | Payer: BLUE CROSS/BLUE SHIELD | Source: Ambulatory Visit | Attending: Internal Medicine | Admitting: Internal Medicine

## 2014-08-14 DIAGNOSIS — C7B8 Other secondary neuroendocrine tumors: Secondary | ICD-10-CM

## 2014-08-14 DIAGNOSIS — C3491 Malignant neoplasm of unspecified part of right bronchus or lung: Secondary | ICD-10-CM

## 2014-08-14 DIAGNOSIS — C7A1 Malignant poorly differentiated neuroendocrine tumors: Secondary | ICD-10-CM

## 2014-08-14 LAB — CBC WITH DIFFERENTIAL/PLATELET
BASO%: 0.5 % (ref 0.0–2.0)
Basophils Absolute: 0 10*3/uL (ref 0.0–0.1)
EOS%: 3.2 % (ref 0.0–7.0)
Eosinophils Absolute: 0.1 10*3/uL (ref 0.0–0.5)
HCT: 40.3 % (ref 38.4–49.9)
HGB: 13.6 g/dL (ref 13.0–17.1)
LYMPH#: 0.7 10*3/uL — AB (ref 0.9–3.3)
LYMPH%: 16.2 % (ref 14.0–49.0)
MCH: 32.5 pg (ref 27.2–33.4)
MCHC: 33.9 g/dL (ref 32.0–36.0)
MCV: 96.1 fL (ref 79.3–98.0)
MONO#: 0.6 10*3/uL (ref 0.1–0.9)
MONO%: 12.8 % (ref 0.0–14.0)
NEUT#: 3 10*3/uL (ref 1.5–6.5)
NEUT%: 67.3 % (ref 39.0–75.0)
PLATELETS: 252 10*3/uL (ref 140–400)
RBC: 4.19 10*6/uL — AB (ref 4.20–5.82)
RDW: 12.7 % (ref 11.0–14.6)
WBC: 4.5 10*3/uL (ref 4.0–10.3)

## 2014-08-14 LAB — COMPREHENSIVE METABOLIC PANEL (CC13)
ALBUMIN: 3.6 g/dL (ref 3.5–5.0)
ALT: 11 U/L (ref 0–55)
ANION GAP: 7 meq/L (ref 3–11)
AST: 18 U/L (ref 5–34)
Alkaline Phosphatase: 72 U/L (ref 40–150)
BILIRUBIN TOTAL: 0.34 mg/dL (ref 0.20–1.20)
BUN: 15.6 mg/dL (ref 7.0–26.0)
CO2: 30 mEq/L — ABNORMAL HIGH (ref 22–29)
Calcium: 9.6 mg/dL (ref 8.4–10.4)
Chloride: 103 mEq/L (ref 98–109)
Creatinine: 0.9 mg/dL (ref 0.7–1.3)
EGFR: 87 mL/min/{1.73_m2} — AB (ref >90)
Glucose: 96 mg/dl (ref 70–140)
Potassium: 4.1 mEq/L (ref 3.5–5.1)
Sodium: 140 mEq/L (ref 136–145)
TOTAL PROTEIN: 6.8 g/dL (ref 6.4–8.3)

## 2014-08-14 MED ORDER — IOHEXOL 300 MG/ML  SOLN
50.0000 mL | Freq: Once | INTRAMUSCULAR | Status: AC | PRN
  Administered 2014-08-14: 50 mL via ORAL

## 2014-08-14 MED ORDER — IOHEXOL 300 MG/ML  SOLN
100.0000 mL | Freq: Once | INTRAMUSCULAR | Status: AC | PRN
  Administered 2014-08-14: 100 mL via INTRAVENOUS

## 2014-08-20 ENCOUNTER — Other Ambulatory Visit: Payer: Self-pay | Admitting: Hematology

## 2014-08-21 ENCOUNTER — Telehealth: Payer: Self-pay | Admitting: Internal Medicine

## 2014-08-21 ENCOUNTER — Encounter: Payer: Self-pay | Admitting: Internal Medicine

## 2014-08-21 ENCOUNTER — Ambulatory Visit (HOSPITAL_BASED_OUTPATIENT_CLINIC_OR_DEPARTMENT_OTHER): Payer: BLUE CROSS/BLUE SHIELD | Admitting: Internal Medicine

## 2014-08-21 VITALS — BP 108/78 | HR 82 | Temp 97.9°F | Resp 18 | Ht 67.0 in | Wt 108.9 lb

## 2014-08-21 DIAGNOSIS — C7A1 Malignant poorly differentiated neuroendocrine tumors: Secondary | ICD-10-CM | POA: Diagnosis not present

## 2014-08-21 DIAGNOSIS — R918 Other nonspecific abnormal finding of lung field: Secondary | ICD-10-CM

## 2014-08-21 DIAGNOSIS — C7B8 Other secondary neuroendocrine tumors: Secondary | ICD-10-CM

## 2014-08-21 DIAGNOSIS — R599 Enlarged lymph nodes, unspecified: Secondary | ICD-10-CM | POA: Diagnosis not present

## 2014-08-21 DIAGNOSIS — C3491 Malignant neoplasm of unspecified part of right bronchus or lung: Secondary | ICD-10-CM

## 2014-08-21 DIAGNOSIS — R630 Anorexia: Secondary | ICD-10-CM

## 2014-08-21 DIAGNOSIS — M79601 Pain in right arm: Secondary | ICD-10-CM | POA: Insufficient documentation

## 2014-08-21 HISTORY — DX: Pain in right arm: M79.601

## 2014-08-21 MED ORDER — METHYLPREDNISOLONE 4 MG PO TBPK: ORAL_TABLET | ORAL | Status: DC

## 2014-08-21 NOTE — Telephone Encounter (Signed)
s.w. pt wife and advised on July and Aug appt....ok and aware....she will pick up new sched at visit

## 2014-08-21 NOTE — Progress Notes (Signed)
Ricky Villanueva:([phone]) [phone]   Fax:(336) [place] [address] [place] [address]  DIAGNOSIS: Small cell lung cancer SCLC (Extensive Stage) with brain metastasis diagnosed in March 2015.  PRIOR THERAPY: 1. First-line chemotherapy with 5 cycles of carboplatin (cisplatin for the first cycle) and etoposide from 05/18/2013 to 10/15/2013.  2. WB XRT and palliative chest radiation given on 05/03/13 - 05/18/13 per Dr. Pablo Ledger.  CURRENT THERAPY: Observation.   INTERVAL HISTORY: Ricky Villanueva 64 y.o. male returns to the clinic today for follow-up visit accompanied by his wife, son and daughter. His chemotherapy was completed in August 2015 and he has been observation since that time. The patient is feeling much better today and gained few pounds since his last visit. He has occasional cough productive of brownish sputum but no hemoptysis. He also has right arm pain and inability to raise his arm above the shoulder. He denied having any significant chest pain but has mild shortness of breath. He denied having any nausea or vomiting. He has no fever or chills. The patient had repeat CT scan of the chest, abdomen and pelvis performed recently and he is here for evaluation and discussion of his scan results.  MEDICAL HISTORY: Past Medical History  Diagnosis Date  . Hyperlipidemia   . Hypertension   . GERD (gastroesophageal reflux disease)   . Respiratory failure with hypoxia 04/21/2013    secondary to pneumonia/notes 04/21/2013  . Pulmonary embolism     "got one in there now" (04/21/2013)  . COPD (chronic obstructive pulmonary disease)   . Pneumonia 2/?01/2014    "wouldn't get better; hospitalized 04/21/2013)  . Arthritis     "minor in my right hand" (04/21/2013)  . History of radiation therapy 05/03/13-05/18/13    chest & whole brain  . Cancer     lung ca dx'd 03/2013  . Colon polyps     hyperplastic     ALLERGIES:  is allergic to decadron and augmentin.  MEDICATIONS:  Current Outpatient Prescriptions  Medication Sig Dispense Refill  . docusate sodium (COLACE) 100 MG capsule Take 100 mg by mouth 2 (two) times daily.    . Iron-Vitamins (GERITOL PO) Take 1 tablet by mouth daily.    [name] lidocaine-prilocaine (EMLA) cream Apply to port site one hour before treatment and cover with plastic wrap. 1 each 2  . LORazepam (ATIVAN) 0.5 MG tablet Take 1 tablet (0.5 mg total) by mouth 2 (two) times daily. for anxiety 60 tablet 3  . magnesium hydroxide (MILK OF MAGNESIA) 400 MG/5ML suspension Take 5 mLs by mouth daily as needed for mild constipation.    . mirtazapine (REMERON) 30 MG tablet Take 1 tablet (30 mg total) by mouth at bedtime. 90 tablet 1  . morphine (MS CONTIN) 30 MG 12 hr tablet Take 1 tablet (30 mg total) by mouth every 12 (twelve) hours. 60 tablet 0  . omeprazole (PRILOSEC) 40 MG capsule Take 40 mg by mouth daily.     [name] oxyCODONE (OXY IR/ROXICODONE) 5 MG immediate release tablet Take 1 tablet (5 mg total) by mouth every 6 (six) hours as needed for severe pain. 120 tablet 0  . polyethylene glycol powder (MIRALAX) powder Take 17 g by mouth daily. 255 g 0  . rivaroxaban (XARELTO) 20 MG TABS tablet Take 1 tablet (20 mg total) by mouth daily with supper. 30 tablet 1  . tiotropium (SPIRIVA HANDIHALER) 18 MCG  inhalation capsule Place 1 capsule (18 mcg total) into inhaler and inhale daily. 90 capsule 1  . vitamin B-12 (CYANOCOBALAMIN) 100 MCG tablet Take 100 mcg by mouth daily.    [name] albuterol (PROVENTIL HFA;VENTOLIN HFA) 108 (90 BASE) MCG/ACT inhaler Inhale 2 puffs into the lungs every 6 (six) hours as needed for wheezing or shortness of breath. (Patient not taking: Reported on 08/21/2014) 1 Inhaler 2  . ondansetron (ZOFRAN) 8 MG tablet Take 8 mg by mouth every 8 (eight) hours as needed for nausea or vomiting (nausea).     . prochlorperazine (COMPAZINE) 10 MG tablet Take 10 mg by mouth every 6 (six)  hours as needed for nausea or vomiting (nausea).      No current facility-administered medications for this visit.   Facility-Administered Medications Ordered in Other Visits  Medication Dose Route Frequency Provider Last Rate Last Dose  . 0.9 %  sodium chloride infusion  1,000 mL Intravenous Once Concha Norway, MD      . 0.9 %  sodium chloride infusion   Intravenous Once Concha Norway, MD        SURGICAL HISTORY:  Past Surgical History  Procedure Laterality Date  . Inguinal hernia repair Right ~ 2005  . Finger surgery Left ~ 1989    "crushed end of my middle finger off"   . Video bronchoscopy Bilateral 04/26/2013    Procedure: VIDEO BRONCHOSCOPY WITH FLUORO;  Surgeon: Wilhelmina Mcardle, MD;  Location: [facility] ENDOSCOPY;  Service: Cardiopulmonary;  Laterality: Bilateral;    REVIEW OF SYSTEMS:  Constitutional: positive for fatigue Eyes: negative Ears, nose, mouth, throat, and face: negative Respiratory: positive for cough and dyspnea on exertion Cardiovascular: negative Gastrointestinal: negative Genitourinary:negative Integument/breast: negative Hematologic/lymphatic: negative Musculoskeletal:negative Neurological: negative Behavioral/Psych: negative Endocrine: negative Allergic/Immunologic: negative   PHYSICAL EXAMINATION: General appearance: alert, cooperative, fatigued and no distress Head: Normocephalic, without obvious abnormality, atraumatic Neck: no adenopathy, no JVD, supple, symmetrical, trachea midline and thyroid not enlarged, symmetric, no tenderness/mass/nodules Lymph nodes: Cervical, supraclavicular, and axillary nodes normal. Resp: clear to auscultation bilaterally Back: symmetric, no curvature. ROM normal. No CVA tenderness. Cardio: regular rate and rhythm, S1, S2 normal, no murmur, click, rub or gallop GI: soft, non-tender; bowel sounds normal; no masses,  no organomegaly Extremities: extremities normal, atraumatic, no cyanosis or edema Neurologic: Alert and oriented X  3, normal strength and tone. Normal symmetric reflexes. Normal coordination and gait  ECOG PERFORMANCE STATUS: 1 - Symptomatic but completely ambulatory  Blood pressure 108/78, pulse 82, temperature 97.9 F (36.6 C), temperature source Oral, resp. rate 18, height '5\' 7"'$  (1.702 m), weight 108 lb 14.4 oz (49.397 kg), SpO2 96 %.  LABORATORY DATA: Lab Results  Component Value Date   WBC 4.5 08/14/2014   HGB 13.6 08/14/2014   HCT 40.3 08/14/2014   MCV 96.1 08/14/2014   PLT 252 08/14/2014      Chemistry      Component Value Date/Time   NA 140 08/14/2014 0917   NA 139 01/06/2014 1352   K 4.1 08/14/2014 0917   K 4.3 01/06/2014 1352   CL 100 01/06/2014 1352   CO2 30* 08/14/2014 0917   CO2 25 01/06/2014 1352   BUN 15.6 08/14/2014 0917   BUN 10 01/06/2014 1352   CREATININE 0.9 08/14/2014 0917   CREATININE 0.73 01/06/2014 1352      Component Value Date/Time   CALCIUM 9.6 08/14/2014 0917   CALCIUM 9.7 01/06/2014 1352   ALKPHOS 72 08/14/2014 0917   ALKPHOS 64 07/17/2014 0902  AST 18 08/14/2014 0917   AST 15 07/17/2014 0902   ALT 11 08/14/2014 0917   ALT 8 07/17/2014 0902   BILITOT 0.34 08/14/2014 0917   BILITOT 0.2 07/17/2014 0902       RADIOGRAPHIC STUDIES: Ct Chest W Contrast  08/14/2014   CLINICAL DATA:  Restaging small cell lung cancer  EXAM: CT CHEST, ABDOMEN, AND PELVIS WITH CONTRAST  TECHNIQUE: Multidetector CT imaging of the chest, abdomen and pelvis was performed following the standard protocol during bolus administration of intravenous contrast.  CONTRAST:  74m OMNIPAQUE IOHEXOL 300 MG/ML SOLN, 1057mOMNIPAQUE IOHEXOL 300 MG/ML SOLN  COMPARISON:  06/15/2014  FINDINGS: CT CHEST FINDINGS  Chest wall: No chest wall mass, supraclavicular or axillary lymphadenopathy. The thyroid gland appears normal. The bony thorax is intact. No destructive bone lesions or spinal canal compromise.  Mediastinum: The heart is normal in size. No pericardial effusion. Stable aortic and coronary  artery calcifications. No focal aneurysm or dissection. The esophagus is grossly normal. Stable ill-defined low-attenuation tissue in the subcarinal space which could be treated tumor and/or fluid. I do not see a discrete mass or nodule. Small right hilar lymph nodes appears stable.  Lungs/pleura: Progressive right lower lobe masslike densities with a somewhat branching pattern most likely branching endobronchial tumor with significant surrounding inflammation or interstitial spread of tumor. Radiation fibrosis is also likely contributory. The larger more proximal masslike lesion in the right lower lobe on image number 42 measures 32 x 19 mm. This previously measured 19.5 x 13.5 mm. The more inferior nodular density on image number 48 measures approximately 43 x 28 mm and previously measured 26 x 14.5 mm.  Increasing nodular density in the paramediastinal right upper lobe. Could not exclude tumor. The nodularity is atypical for radiation change.  Stable underlying emphysematous changes.  No left lung pulmonary lesions.  CT ABDOMEN AND PELVIS FINDINGS  Two new vague right hepatic lobe liver lesions are worrisome for metastatic disease. The larger lesion measures 11 mm on image number 65. There is an enlarging subdiaphragmatic lymph node adjacent to the liver on image number 54 which measures 10 mm.  The spleen, adrenal glands and kidneys are unremarkable and stable. Stable small renal cysts.  The gallbladder is unremarkable. There is persistent common bile duct dilatation of uncertain etiology. No obvious distal common bile duct stone, ampullary lesion or pancreatic head mass. There is moderate pancreatic atrophy and a slightly prominent main pancreatic duct. ERCP or MRCP may be helpful for further evaluation.  The stomach, duodenum, small bowel and colon are grossly normal. No inflammatory changes, mass lesions or obstructive findings.  Stable aortic atherosclerotic calcifications but no aneurysm or dissection.  There are small scattered mesenteric and retroperitoneal lymph nodes but no mass or adenopathy.  The bladder, prostate gland and seminal vesicles are unremarkable. No pelvic mass or adenopathy. No free pelvic fluid collections. No inguinal mass or adenopathy.  No significant bony findings. No obvious osseous metastatic disease.  IMPRESSION: 1. Progression of multiple ill-defined soft tissue masses in the right lower lobe and extensive interstitial thickening. Findings suggest progressive tumor and radiation change versus interstitial spread of tumor. 2. Progressive nodularity in the right upper lobe worrisome for tumor. 3. No enlarging mediastinal or hilar lymph nodes. Stable sub carinal low-attenuation soft tissue density and/or fluid. 4. Enlarging subdiaphragmatic lymph node and findings suspicious for new hepatic metastatic disease. 5. Stable common bile duct dilatation.   Electronically Signed   By: P.Marijo Sanes.D.   On:  08/14/2014 13:53   Ct Abdomen Pelvis W Contrast  08/14/2014   CLINICAL DATA:  Restaging small cell lung cancer  EXAM: CT CHEST, ABDOMEN, AND PELVIS WITH CONTRAST  TECHNIQUE: Multidetector CT imaging of the chest, abdomen and pelvis was performed following the standard protocol during bolus administration of intravenous contrast.  CONTRAST:  93m OMNIPAQUE IOHEXOL 300 MG/ML SOLN, 1062mOMNIPAQUE IOHEXOL 300 MG/ML SOLN  COMPARISON:  06/15/2014  FINDINGS: CT CHEST FINDINGS  Chest wall: No chest wall mass, supraclavicular or axillary lymphadenopathy. The thyroid gland appears normal. The bony thorax is intact. No destructive bone lesions or spinal canal compromise.  Mediastinum: The heart is normal in size. No pericardial effusion. Stable aortic and coronary artery calcifications. No focal aneurysm or dissection. The esophagus is grossly normal. Stable ill-defined low-attenuation tissue in the subcarinal space which could be treated tumor and/or fluid. I do not see a discrete mass or nodule.  Small right hilar lymph nodes appears stable.  Lungs/pleura: Progressive right lower lobe masslike densities with a somewhat branching pattern most likely branching endobronchial tumor with significant surrounding inflammation or interstitial spread of tumor. Radiation fibrosis is also likely contributory. The larger more proximal masslike lesion in the right lower lobe on image number 42 measures 32 x 19 mm. This previously measured 19.5 x 13.5 mm. The more inferior nodular density on image number 48 measures approximately 43 x 28 mm and previously measured 26 x 14.5 mm.  Increasing nodular density in the paramediastinal right upper lobe. Could not exclude tumor. The nodularity is atypical for radiation change.  Stable underlying emphysematous changes.  No left lung pulmonary lesions.  CT ABDOMEN AND PELVIS FINDINGS  Two new vague right hepatic lobe liver lesions are worrisome for metastatic disease. The larger lesion measures 11 mm on image number 65. There is an enlarging subdiaphragmatic lymph node adjacent to the liver on image number 54 which measures 10 mm.  The spleen, adrenal glands and kidneys are unremarkable and stable. Stable small renal cysts.  The gallbladder is unremarkable. There is persistent common bile duct dilatation of uncertain etiology. No obvious distal common bile duct stone, ampullary lesion or pancreatic head mass. There is moderate pancreatic atrophy and a slightly prominent main pancreatic duct. ERCP or MRCP may be helpful for further evaluation.  The stomach, duodenum, small bowel and colon are grossly normal. No inflammatory changes, mass lesions or obstructive findings.  Stable aortic atherosclerotic calcifications but no aneurysm or dissection. There are small scattered mesenteric and retroperitoneal lymph nodes but no mass or adenopathy.  The bladder, prostate gland and seminal vesicles are unremarkable. No pelvic mass or adenopathy. No free pelvic fluid collections. No inguinal  mass or adenopathy.  No significant bony findings. No obvious osseous metastatic disease.  IMPRESSION: 1. Progression of multiple ill-defined soft tissue masses in the right lower lobe and extensive interstitial thickening. Findings suggest progressive tumor and radiation change versus interstitial spread of tumor. 2. Progressive nodularity in the right upper lobe worrisome for tumor. 3. No enlarging mediastinal or hilar lymph nodes. Stable sub carinal low-attenuation soft tissue density and/or fluid. 4. Enlarging subdiaphragmatic lymph node and findings suspicious for new hepatic metastatic disease. 5. Stable common bile duct dilatation.   Electronically Signed   By: P.Marijo Sanes.D.   On: 08/14/2014 13:53    ASSESSMENT AND PLAN: This is a very pleasant [age] old white male with extensive stage small cell lung cancer status post palliative radiotherapy to the brain and chest as well as  6 cycles of systemic chemotherapy with carboplatin and etoposide completed in August 2015. The patient has been observation for the last 10 months. The recent CT scan of the chest, abdomen and pelvis showed evidence for disease progression with multiple ill-defined soft tissue masses in the right lower lobe and an enlarging subdiaphragmatic lymph nodes and a new suspicious hepatic metastasis. I had a lengthy discussion with the patient his family about his current condition and treatment options. I the patient the option of palliative care versus resuming systemic chemotherapy with carboplatin for AUC of 5 on day 1 and etoposide 100 MG/M2 on days 1, 2 and 3 with Neulasta support on day 4. The patient is interested in resuming systemic chemotherapy. I discussed with him the adverse effect of this treatment including but not limited to alopecia, myelosuppression, nausea and vomiting, peripheral neuropathy, liver or renal dysfunction. He is expected to start the first cycle of this treatment on 09/04/2014. For the right  arm pain, I will order CT of the right arm for evaluation and to rule out any metastatic disease in this area. For the lack of appetite, I will start the patient on Medrol Dosepak. The patient was advised to call if he has any concerning symptoms in the interval. The patient voices understanding of current disease status and treatment options and is in agreement with the current care plan.  All questions were answered. The patient knows to call the clinic with any problems, questions or concerns. We can certainly see the patient much sooner if necessary.  I spent 15 minutes counseling the patient face to face. The total time spent in the appointment was 25 minutes.  Disclaimer: This note was dictated with voice recognition software. Similar sounding words can inadvertently be transcribed and may not be corrected upon review.

## 2014-08-22 ENCOUNTER — Other Ambulatory Visit: Payer: Self-pay | Admitting: *Deleted

## 2014-08-22 DIAGNOSIS — C3491 Malignant neoplasm of unspecified part of right bronchus or lung: Secondary | ICD-10-CM

## 2014-08-22 MED ORDER — METHYLPREDNISOLONE 4 MG PO TBPK: ORAL_TABLET | ORAL | Status: DC

## 2014-08-22 NOTE — Telephone Encounter (Signed)
Medrol escbribed to [facility] not [facility] in [place]. Will have filled at [facility] at wife's request.

## 2014-08-23 ENCOUNTER — Telehealth: Payer: Self-pay | Admitting: Medical Oncology

## 2014-08-23 ENCOUNTER — Telehealth: Payer: Self-pay | Admitting: *Deleted

## 2014-08-23 MED ORDER — MIRTAZAPINE 30 MG PO TABS: 30.0000 mg | ORAL_TABLET | Freq: Every day | ORAL | Status: DC

## 2014-08-23 NOTE — Telephone Encounter (Signed)
TC from [place] requesting an order be fax'd to them for Lactated Ringers 1 liter over 4 hours weekly. Their fax # is [phone]

## 2014-08-23 NOTE — Addendum Note (Signed)
Addended by: Ardeen Garland on: 08/23/2014 03:21 PM   Modules accepted: Orders, Medications

## 2014-08-23 NOTE — Telephone Encounter (Signed)
Wife calling and adamant that pt receive IVF . I called [facility] and spoke to nurse and told her Dr [name] is not going to order home IVF at this time but will give him IVF if needed while he is here for chemo. Wife informed via voice message of same. Home RN will see pt tomorrow. RN stated pts BP was 110/52 prior to IVF last week with normal pulse.

## 2014-08-25 ENCOUNTER — Encounter (HOSPITAL_COMMUNITY): Payer: Self-pay

## 2014-08-25 ENCOUNTER — Ambulatory Visit (HOSPITAL_COMMUNITY)
Admission: RE | Admit: 2014-08-25 | Discharge: 2014-08-25 | Disposition: A | Discharge status: Home / Self Care | Payer: BLUE CROSS/BLUE SHIELD | Source: Ambulatory Visit | Attending: Internal Medicine | Admitting: Internal Medicine

## 2014-08-25 DIAGNOSIS — J439 Emphysema, unspecified: Secondary | ICD-10-CM | POA: Insufficient documentation

## 2014-08-25 DIAGNOSIS — R2231 Localized swelling, mass and lump, right upper limb: Secondary | ICD-10-CM | POA: Diagnosis not present

## 2014-08-25 DIAGNOSIS — C3491 Malignant neoplasm of unspecified part of right bronchus or lung: Secondary | ICD-10-CM

## 2014-08-25 DIAGNOSIS — C3431 Malignant neoplasm of lower lobe, right bronchus or lung: Secondary | ICD-10-CM | POA: Diagnosis not present

## 2014-08-25 DIAGNOSIS — M79601 Pain in right arm: Secondary | ICD-10-CM | POA: Insufficient documentation

## 2014-08-25 MED ORDER — IOHEXOL 300 MG/ML  SOLN
100.0000 mL | Freq: Once | INTRAMUSCULAR | Status: AC | PRN
  Administered 2014-08-25: 100 mL via INTRAVENOUS

## 2014-08-29 ENCOUNTER — Telehealth: Payer: Self-pay | Admitting: *Deleted

## 2014-08-29 ENCOUNTER — Other Ambulatory Visit: Payer: Self-pay | Admitting: Hematology

## 2014-08-29 NOTE — Telephone Encounter (Signed)
VM message from pt's wife requesting refill on Xarelto.  Ok to refill?

## 2014-08-30 ENCOUNTER — Other Ambulatory Visit: Payer: Self-pay | Admitting: *Deleted

## 2014-08-30 DIAGNOSIS — C3490 Malignant neoplasm of unspecified part of unspecified bronchus or lung: Secondary | ICD-10-CM

## 2014-08-30 DIAGNOSIS — M545 Low back pain, unspecified: Secondary | ICD-10-CM

## 2014-08-30 DIAGNOSIS — R918 Other nonspecific abnormal finding of lung field: Secondary | ICD-10-CM

## 2014-08-30 DIAGNOSIS — K1230 Oral mucositis (ulcerative), unspecified: Secondary | ICD-10-CM

## 2014-08-30 MED ORDER — RIVAROXABAN 20 MG PO TABS: 20.0000 mg | ORAL_TABLET | Freq: Every day | ORAL | Status: DC

## 2014-09-01 ENCOUNTER — Emergency Department (HOSPITAL_COMMUNITY)
Admission: EM | Admit: 2014-09-01 | Discharge: 2014-09-01 | Disposition: A | Discharge status: Home / Self Care | Payer: BLUE CROSS/BLUE SHIELD | Attending: Emergency Medicine | Admitting: Emergency Medicine

## 2014-09-01 ENCOUNTER — Emergency Department (HOSPITAL_COMMUNITY): Payer: BLUE CROSS/BLUE SHIELD

## 2014-09-01 ENCOUNTER — Encounter (HOSPITAL_COMMUNITY): Payer: Self-pay | Admitting: Emergency Medicine

## 2014-09-01 DIAGNOSIS — Z86010 Personal history of colonic polyps: Secondary | ICD-10-CM | POA: Diagnosis not present

## 2014-09-01 DIAGNOSIS — K219 Gastro-esophageal reflux disease without esophagitis: Secondary | ICD-10-CM | POA: Diagnosis not present

## 2014-09-01 DIAGNOSIS — I10 Essential (primary) hypertension: Secondary | ICD-10-CM | POA: Diagnosis not present

## 2014-09-01 DIAGNOSIS — Z9889 Other specified postprocedural states: Secondary | ICD-10-CM | POA: Diagnosis not present

## 2014-09-01 DIAGNOSIS — E785 Hyperlipidemia, unspecified: Secondary | ICD-10-CM | POA: Diagnosis not present

## 2014-09-01 DIAGNOSIS — Z7901 Long term (current) use of anticoagulants: Secondary | ICD-10-CM | POA: Insufficient documentation

## 2014-09-01 DIAGNOSIS — J449 Chronic obstructive pulmonary disease, unspecified: Secondary | ICD-10-CM | POA: Diagnosis not present

## 2014-09-01 DIAGNOSIS — R0602 Shortness of breath: Secondary | ICD-10-CM | POA: Diagnosis not present

## 2014-09-01 DIAGNOSIS — M1990 Unspecified osteoarthritis, unspecified site: Secondary | ICD-10-CM | POA: Insufficient documentation

## 2014-09-01 DIAGNOSIS — Z85118 Personal history of other malignant neoplasm of bronchus and lung: Secondary | ICD-10-CM | POA: Insufficient documentation

## 2014-09-01 DIAGNOSIS — Z8701 Personal history of pneumonia (recurrent): Secondary | ICD-10-CM | POA: Insufficient documentation

## 2014-09-01 DIAGNOSIS — R042 Hemoptysis: Secondary | ICD-10-CM | POA: Diagnosis present

## 2014-09-01 DIAGNOSIS — Z87891 Personal history of nicotine dependence: Secondary | ICD-10-CM | POA: Insufficient documentation

## 2014-09-01 DIAGNOSIS — Z79899 Other long term (current) drug therapy: Secondary | ICD-10-CM | POA: Diagnosis not present

## 2014-09-01 DIAGNOSIS — Z86711 Personal history of pulmonary embolism: Secondary | ICD-10-CM | POA: Insufficient documentation

## 2014-09-01 LAB — CBC
HEMATOCRIT: 40.6 % (ref 39.0–52.0)
HEMOGLOBIN: 14 g/dL (ref 13.0–17.0)
MCH: 32.6 pg (ref 26.0–34.0)
MCHC: 34.5 g/dL (ref 30.0–36.0)
MCV: 94.6 fL (ref 78.0–100.0)
PLATELETS: 255 10*3/uL (ref 150–400)
RBC: 4.29 MIL/uL (ref 4.22–5.81)
RDW: 12 % (ref 11.5–15.5)
WBC: 7.9 10*3/uL (ref 4.0–10.5)

## 2014-09-01 LAB — BRAIN NATRIURETIC PEPTIDE: B Natriuretic Peptide: 46.9 pg/mL (ref 0.0–100.0)

## 2014-09-01 LAB — COMPREHENSIVE METABOLIC PANEL
ALT: 14 U/L — ABNORMAL LOW (ref 17–63)
AST: 20 U/L (ref 15–41)
Albumin: 4.2 g/dL (ref 3.5–5.0)
Alkaline Phosphatase: 67 U/L (ref 38–126)
Anion gap: 9 (ref 5–15)
BUN: 11 mg/dL (ref 6–20)
CALCIUM: 9.5 mg/dL (ref 8.9–10.3)
CHLORIDE: 103 mmol/L (ref 101–111)
CO2: 27 mmol/L (ref 22–32)
CREATININE: 0.92 mg/dL (ref 0.61–1.24)
GFR calc non Af Amer: >60 mL/min (ref >60)
Glucose, Bld: 126 mg/dL — ABNORMAL HIGH (ref 65–99)
Potassium: 4.5 mmol/L (ref 3.5–5.1)
Sodium: 139 mmol/L (ref 135–145)
Total Bilirubin: 0.9 mg/dL (ref 0.3–1.2)
Total Protein: 7.4 g/dL (ref 6.5–8.1)

## 2014-09-01 LAB — I-STAT TROPONIN, ED: Troponin i, poc: 0.01 ng/mL (ref 0.00–0.08)

## 2014-09-01 LAB — PROTIME-INR
INR: 1.06 (ref 0.00–1.49)
PROTHROMBIN TIME: 14 s (ref 11.6–15.2)

## 2014-09-01 MED ORDER — HYDROCOD POLST-CPM POLST ER 10-8 MG/5ML PO SUER: 5.0000 mL | Freq: Two times a day (BID) | ORAL | Status: DC | PRN

## 2014-09-01 MED ORDER — IOHEXOL 350 MG/ML SOLN
100.0000 mL | Freq: Once | INTRAVENOUS | Status: AC | PRN
  Administered 2014-09-01: 100 mL via INTRAVENOUS

## 2014-09-01 NOTE — ED Provider Notes (Signed)
Patient sent to me by Dr. Tomi Bamberger in CT results reviewed with him. Will give patient prescriptions for cough medication he will keep his appointment at the cancer center on Monday return precautions given  Lacretia Leigh, MD 09/01/14 1724

## 2014-09-01 NOTE — Discharge Instructions (Signed)
Hemoptysis  Hemoptysis, which means coughing up blood, can be a sign of a minor problem or a serious medical condition. The blood that is coughed up may come from the lungs and airways. Coughed-up blood can also come from bleeding that occurs outside the lungs and airways. Blood can drain into the windpipe during a severe nosebleed or when blood is vomited from the stomach. Because hemoptysis can be a sign of something serious, a medical evaluation is required. For some people with hemoptysis, no definite cause is ever identified.  CAUSES   The most common cause of hemoptysis is bronchitis. Some other common causes include:    A ruptured blood vessel caused by coughing or an infection.    A medical condition that causes damage to the large air passageways (bronchiectasis).    A blood clot in the lungs (pulmonary embolism).    Pneumonia.    Tuberculosis.    Breathing in a small foreign object.    Cancer.  For some people with hemoptysis, no definite cause is ever identified.   HOME CARE INSTRUCTIONS   Only take over-the-counter or prescription medicines as directed by your caregiver. Do not use cough suppressants unless your caregiver approves.   If your caregiver prescribes antibiotic medicines, take them as directed. Finish them even if you start to feel better.   Do not smoke. Also avoid secondhand smoke.   Follow up with your caregiver as directed.  SEEK IMMEDIATE MEDICAL CARE IF:    You cough up bloody mucus for longer than a week.   You have a blood-producing cough that is severe or getting worse.   You have a blood-producing cough thatcomes and goes over time.   You develop problems with your breathing.    You vomit blood.   You develop bloody or black-colored stools.   You have chest pain.    You develop night sweats.   You feel faint or pass out.    You have a fever or persistent symptoms for more than 2-3 days.   You have a fever and your symptoms suddenly get worse.  MAKE  SURE YOU:   Understand these instructions.   Will watch your condition.   Will get help right away if you are not doing well or get worse.  Document Released: 04/21/2001 Document Revised: 01/28/2012 Document Reviewed: 11/28/2011  Exit[employer] Patient Information 2015 [employer]. This information is not intended to replace advice given to you by your health care provider. Make sure you discuss any questions you have with your health care provider.

## 2014-09-01 NOTE — ED Notes (Signed)
Questions r/t dc were denied. Pt ambulatory but will use wheel chair for transport. Pt a&ox4, pt provided his own oxygen

## 2014-09-01 NOTE — ED Notes (Signed)
Per pt, states he is coughing up blood, history on lung cancer-states he feels like he has fluid in lungs

## 2014-09-01 NOTE — ED Notes (Signed)
Peak Flow = 130

## 2014-09-01 NOTE — ED Notes (Signed)
Bed: UR42 Expected date:  Expected time:  Means of arrival:  Comments: hold

## 2014-09-01 NOTE — ED Provider Notes (Signed)
CSN: [id number]     Arrival date & time 09/01/14  1228 History   First MD Initiated Contact with Patient 09/01/14 1232     Chief Complaint  Patient presents with  . Shortness of Breath  . Hemoptysis    HPI Pt has history of stage 4 lung ca that was in remission but has returned with mets to the liver.  He started having coughing last night.  He is scheduled to start chemo again. He noticed some blood in the mucus he was coughing up.  He also felt some gurgling in his lungs.  NO fevers.  No vomiting or diarrhea.  He is normally on oxygen 2l at home but he has been feeling more short of breath and that is a new issue even with his cancer. Past Medical History  Diagnosis Date  . Hyperlipidemia   . Hypertension   . GERD (gastroesophageal reflux disease)   . Respiratory failure with hypoxia 04/21/2013    secondary to pneumonia/notes 04/21/2013  . Pulmonary embolism     "got one in there now" (04/21/2013)  . COPD (chronic obstructive pulmonary disease)   . Pneumonia 2/?01/2014    "wouldn't get better; hospitalized 04/21/2013)  . Arthritis     "minor in my right hand" (04/21/2013)  . History of radiation therapy 05/03/13-05/18/13    chest & whole brain  . Cancer     lung ca dx'd 03/2013  . Colon polyps     hyperplastic  . Right arm pain 08/21/2014   Past Surgical History  Procedure Laterality Date  . Inguinal hernia repair Right ~ 2005  . Finger surgery Left ~ 1989    "crushed end of my middle finger off"   . Video bronchoscopy Bilateral 04/26/2013    Procedure: VIDEO BRONCHOSCOPY WITH FLUORO;  Surgeon: Wilhelmina Mcardle, MD;  Location: [facility] END[facility];  Service: Cardiopulmonary;  Laterality: Bilateral;   Family History  Problem Relation Age of Onset  . Breast cancer Sister   . Emphysema Mother   . Diabetes Father    History  Substance Use Topics  . Smoking status: Former Smoker -- 2.00 packs/day for 47 years    Types: Cigarettes    Quit date: 12/25/2011  . Smokeless tobacco: Never Used   . Alcohol Use: No     Comment: 04/21/2013 "used to drink a little bit; very little; nothing in years"    Review of Systems  All other systems reviewed and are negative.     Allergies  Decadron and Augmentin  Home Medications   Prior to Admission medications   Medication Sig Start Date End Date Taking? Authorizing Provider  albuterol (PROVENTIL HFA;VENTOLIN HFA) 108 (90 BASE) MCG/ACT inhaler Inhale 2 puffs into the lungs every 6 (six) hours as needed for wheezing or shortness of breath. Patient not taking: Reported on 08/21/2014 04/29/13   Orson Eva, MD  docusate sodium (COLACE) 100 MG capsule Take 100 mg by mouth 2 (two) times daily.    Historical Provider, MD  Iron-Vitamins (GERITOL PO) Take 1 tablet by mouth daily.    Historical Provider, MD  lidocaine-prilocaine (EMLA) cream Apply to port site one hour before treatment and cover with plastic wrap. 09/06/13   Concha Norway, MD  LORazepam (ATIVAN) 0.5 MG tablet Take 1 tablet (0.5 mg total) by mouth 2 (two) times daily. for anxiety 06/16/14   [name], MD  magnesium hydroxide (MILK OF MAGNESIA) 400 MG/5ML suspension Take 5 mLs by mouth daily as needed for mild constipation.  Historical Provider, MD  methylPREDNISolone (MEDROL DOSEPAK) 4 MG TBPK tablet Use as instructed 08/22/14   Curt Bears, MD  mirtazapine (REMERON) 30 MG tablet Take 1 tablet (30 mg total) by mouth at bedtime. 08/23/14   Curt Bears, MD  morphine (MS CONTIN) 30 MG 12 hr tablet Take 1 tablet (30 mg total) by mouth every 12 (twelve) hours. 08/13/14   Curt Bears, MD  omeprazole (PRILOSEC) 40 MG capsule Take 40 mg by mouth daily.  11/22/13   Historical Provider, MD  ondansetron (ZOFRAN) 8 MG tablet Take 8 mg by mouth every 8 (eight) hours as needed for nausea or vomiting (nausea).  06/02/13   Historical Provider, MD  oxyCODONE (OXY IR/ROXICODONE) 5 MG immediate release tablet Take 1 tablet (5 mg total) by mouth every 6 (six) hours as needed for severe pain.  08/13/14   Curt Bears, MD  polyethylene glycol powder (MIRALAX) powder Take 17 g by mouth daily. 08/20/13   Jola Schmidt, MD  prochlorperazine (COMPAZINE) 10 MG tablet Take 10 mg by mouth every 6 (six) hours as needed for nausea or vomiting (nausea).     Historical Provider, MD  rivaroxaban (XARELTO) 20 MG TABS tablet Take 1 tablet (20 mg total) by mouth daily with supper. 08/30/14   Curt Bears, MD  tiotropium (SPIRIVA HANDIHALER) 18 MCG inhalation capsule Place 1 capsule (18 mcg total) into inhaler and inhale daily. 07/13/14   Curt Bears, MD  vitamin B-12 (CYANOCOBALAMIN) 100 MCG tablet Take 100 mcg by mouth daily.    Historical Provider, MD   There were no vitals taken for this visit. Physical Exam  ED Course  Procedures (including critical care time) Labs Review Labs Reviewed  COMPREHENSIVE METABOLIC PANEL - Abnormal; Notable for the following:    Glucose, Bld 126 (*)    ALT 14 (*)    All other components within normal limits  CBC  BRAIN NATRIURETIC PEPTIDE  PROTIME-INR  I-STAT TROPOININ, ED  I-STAT TROPOININ, ED    Imaging Review CXR imaging reviewed   EKG Interpretation   Date/Time:  Friday September 01 2014 12:39:48 EDT Ventricular Rate:  70 PR Interval:  143 QRS Duration: 79 QT Interval:  366 QTC Calculation: 395 R Axis:   86 Text Interpretation:  Sinus rhythm Atrial premature complexes Borderline  right axis deviation Minimal ST elevation, inferior leads No significant  change since last tracing Confirmed by Chardonnay Holzmann  MD-J, Kamela Blansett ([id number]) on  09/01/2014 12:54:30 PM      MDM   Final diagnoses:  Hemoptysis    Pt complaining of worsening shortness of breath and hemoptysis.  CXR shows large mass lesion.  Suspect his hemoptysis was related to the tumor.  CT scan ordered to rule out PE and further characterize the tumor.  Dr Zenia Resides followed up on the CT scan results which revealed worsening tumor.  No PE.  NO gross hemoptysis in the ED.  Dc with outpatient follow  up    Dorie Rank, MD 09/04/14 1105

## 2014-09-01 NOTE — ED Notes (Signed)
MD at bedside. 

## 2014-09-04 ENCOUNTER — Ambulatory Visit (HOSPITAL_BASED_OUTPATIENT_CLINIC_OR_DEPARTMENT_OTHER): Payer: BLUE CROSS/BLUE SHIELD

## 2014-09-04 ENCOUNTER — Encounter: Payer: Self-pay | Admitting: Physician Assistant

## 2014-09-04 ENCOUNTER — Telehealth: Payer: Self-pay | Admitting: Physician Assistant

## 2014-09-04 ENCOUNTER — Ambulatory Visit: Payer: BLUE CROSS/BLUE SHIELD

## 2014-09-04 ENCOUNTER — Ambulatory Visit (HOSPITAL_BASED_OUTPATIENT_CLINIC_OR_DEPARTMENT_OTHER): Payer: BLUE CROSS/BLUE SHIELD | Admitting: Physician Assistant

## 2014-09-04 ENCOUNTER — Other Ambulatory Visit (HOSPITAL_BASED_OUTPATIENT_CLINIC_OR_DEPARTMENT_OTHER): Payer: BLUE CROSS/BLUE SHIELD

## 2014-09-04 VITALS — BP 112/85 | HR 79 | Temp 97.8°F | Resp 17 | Ht 67.0 in | Wt 107.6 lb

## 2014-09-04 DIAGNOSIS — C3490 Malignant neoplasm of unspecified part of unspecified bronchus or lung: Secondary | ICD-10-CM

## 2014-09-04 DIAGNOSIS — C7A1 Malignant poorly differentiated neuroendocrine tumors: Secondary | ICD-10-CM

## 2014-09-04 DIAGNOSIS — R042 Hemoptysis: Secondary | ICD-10-CM

## 2014-09-04 DIAGNOSIS — Z5111 Encounter for antineoplastic chemotherapy: Secondary | ICD-10-CM

## 2014-09-04 DIAGNOSIS — C3491 Malignant neoplasm of unspecified part of right bronchus or lung: Secondary | ICD-10-CM

## 2014-09-04 DIAGNOSIS — C7B8 Other secondary neuroendocrine tumors: Secondary | ICD-10-CM

## 2014-09-04 DIAGNOSIS — R0602 Shortness of breath: Secondary | ICD-10-CM

## 2014-09-04 DIAGNOSIS — R918 Other nonspecific abnormal finding of lung field: Secondary | ICD-10-CM | POA: Diagnosis not present

## 2014-09-04 DIAGNOSIS — R059 Cough, unspecified: Secondary | ICD-10-CM

## 2014-09-04 DIAGNOSIS — M79601 Pain in right arm: Secondary | ICD-10-CM

## 2014-09-04 DIAGNOSIS — Z95828 Presence of other vascular implants and grafts: Secondary | ICD-10-CM

## 2014-09-04 DIAGNOSIS — R599 Enlarged lymph nodes, unspecified: Secondary | ICD-10-CM

## 2014-09-04 DIAGNOSIS — R05 Cough: Secondary | ICD-10-CM

## 2014-09-04 LAB — CBC WITH DIFFERENTIAL/PLATELET
BASO%: 0.5 % (ref 0.0–2.0)
Basophils Absolute: 0 10*3/uL (ref 0.0–0.1)
EOS ABS: 0.1 10*3/uL (ref 0.0–0.5)
EOS%: 2.2 % (ref 0.0–7.0)
HEMATOCRIT: 37.8 % — AB (ref 38.4–49.9)
HGB: 12.9 g/dL — ABNORMAL LOW (ref 13.0–17.1)
LYMPH%: 13.7 % — AB (ref 14.0–49.0)
MCH: 32.8 pg (ref 27.2–33.4)
MCHC: 34.1 g/dL (ref 32.0–36.0)
MCV: 96.2 fL (ref 79.3–98.0)
MONO#: 0.7 10*3/uL (ref 0.1–0.9)
MONO%: 13.1 % (ref 0.0–14.0)
NEUT#: 4 10*3/uL (ref 1.5–6.5)
NEUT%: 70.5 % (ref 39.0–75.0)
PLATELETS: 231 10*3/uL (ref 140–400)
RBC: 3.92 10*6/uL — ABNORMAL LOW (ref 4.20–5.82)
RDW: 12.4 % (ref 11.0–14.6)
WBC: 5.6 10*3/uL (ref 4.0–10.3)
lymph#: 0.8 10*3/uL — ABNORMAL LOW (ref 0.9–3.3)

## 2014-09-04 LAB — COMPREHENSIVE METABOLIC PANEL (CC13)
ALK PHOS: 68 U/L (ref 40–150)
ALT: 10 U/L (ref 0–55)
AST: 14 U/L (ref 5–34)
Albumin: 3.4 g/dL — ABNORMAL LOW (ref 3.5–5.0)
Anion Gap: 8 mEq/L (ref 3–11)
BUN: 9 mg/dL (ref 7.0–26.0)
CHLORIDE: 105 meq/L (ref 98–109)
CO2: 27 meq/L (ref 22–29)
Calcium: 9 mg/dL (ref 8.4–10.4)
Creatinine: 0.8 mg/dL (ref 0.7–1.3)
EGFR: >90 mL/min/{1.73_m2} (ref >90)
GLUCOSE: 103 mg/dL (ref 70–140)
Potassium: 3.9 mEq/L (ref 3.5–5.1)
Sodium: 140 mEq/L (ref 136–145)
Total Bilirubin: 0.31 mg/dL (ref 0.20–1.20)
Total Protein: 6.4 g/dL (ref 6.4–8.3)

## 2014-09-04 MED ORDER — SODIUM CHLORIDE 0.9 % IV SOLN
Freq: Once | INTRAVENOUS | Status: AC
  Administered 2014-09-04: 11:00:00 via INTRAVENOUS
  Filled 2014-09-04: qty 8

## 2014-09-04 MED ORDER — SODIUM CHLORIDE 0.9 % IJ SOLN
10.0000 mL | INTRAMUSCULAR | Status: DC | PRN
  Administered 2014-09-04: 10 mL via INTRAVENOUS
  Filled 2014-09-04: qty 10

## 2014-09-04 MED ORDER — HEPARIN SOD (PORK) LOCK FLUSH 100 UNIT/ML IV SOLN
500.0000 [IU] | Freq: Once | INTRAVENOUS | Status: AC | PRN
  Administered 2014-09-04: 500 [IU]
  Filled 2014-09-04: qty 5

## 2014-09-04 MED ORDER — SODIUM CHLORIDE 0.9 % IV SOLN
422.5000 mg | Freq: Once | INTRAVENOUS | Status: AC
  Administered 2014-09-04: 420 mg via INTRAVENOUS
  Filled 2014-09-04: qty 42

## 2014-09-04 MED ORDER — ONDANSETRON HCL 8 MG PO TABS: 8.0000 mg | ORAL_TABLET | Freq: Three times a day (TID) | ORAL | Status: DC | PRN

## 2014-09-04 MED ORDER — HYDROCOD POLST-CPM POLST ER 10-8 MG/5ML PO SUER: 5.0000 mL | Freq: Two times a day (BID) | ORAL | Status: DC | PRN

## 2014-09-04 MED ORDER — SODIUM CHLORIDE 0.9 % IV SOLN
Freq: Once | INTRAVENOUS | Status: AC
  Administered 2014-09-04: 11:00:00 via INTRAVENOUS

## 2014-09-04 MED ORDER — SODIUM CHLORIDE 0.9 % IV SOLN
100.0000 mg/m2 | Freq: Once | INTRAVENOUS | Status: AC
  Administered 2014-09-04: 150 mg via INTRAVENOUS
  Filled 2014-09-04: qty 7.5

## 2014-09-04 MED ORDER — PROCHLORPERAZINE MALEATE 10 MG PO TABS: 10.0000 mg | ORAL_TABLET | Freq: Four times a day (QID) | ORAL | Status: AC | PRN

## 2014-09-04 MED ORDER — SODIUM CHLORIDE 0.9 % IJ SOLN
10.0000 mL | INTRAMUSCULAR | Status: DC | PRN
  Administered 2014-09-04: 10 mL
  Filled 2014-09-04: qty 10

## 2014-09-04 NOTE — Telephone Encounter (Signed)
per pof to sch pt appt-gave pt avs-sent Adrena email to adv pt ach for 7/15 not 7/18

## 2014-09-04 NOTE — Patient Instructions (Signed)

## 2014-09-04 NOTE — Patient Instructions (Signed)
Etoposide, VP-16 injection  What is this medicine?  ETOPOSIDE, VP-16 (e toe POE side) is a chemotherapy drug. It is used to treat testicular cancer, lung cancer, and other cancers.  This medicine may be used for other purposes; ask your health care provider or pharmacist if you have questions.  COMMON BRAND NAME(S): Etopophos, Toposar, VePesid  What should I tell my health care provider before I take this medicine?  They need to know if you have any of these conditions:  -infection  -kidney disease  -low blood counts, like low white cell, platelet, or red cell counts  -an unusual or allergic reaction to etoposide, other chemotherapeutic agents, other medicines, foods, dyes, or preservatives  -pregnant or trying to get pregnant  -breast-feeding  How should I use this medicine?  This medicine is for infusion into a vein. It is administered in a hospital or clinic by a specially trained health care professional.  Talk to your pediatrician regarding the use of this medicine in children. Special care may be needed.  Overdosage: If you think you have taken too much of this medicine contact a poison control center or emergency room at once.  NOTE: This medicine is only for you. Do not share this medicine with others.  What if I miss a dose?  It is important not to miss your dose. Call your doctor or health care professional if you are unable to keep an appointment.  What may interact with this medicine?  -cyclosporine  -medicines to increase blood counts like filgrastim, pegfilgrastim, sargramostim  -vaccines  This list may not describe all possible interactions. Give your health care provider a list of all the medicines, herbs, non-prescription drugs, or dietary supplements you use. Also tell them if you smoke, drink alcohol, or use illegal drugs. Some items may interact with your medicine.  What should I watch for while using this medicine?  Visit your doctor for checks on your progress. This drug may make you feel  generally unwell. This is not uncommon, as chemotherapy can affect healthy cells as well as cancer cells. Report any side effects. Continue your course of treatment even though you feel ill unless your doctor tells you to stop.  In some cases, you may be given additional medicines to help with side effects. Follow all directions for their use.  Call your doctor or health care professional for advice if you get a fever, chills or sore throat, or other symptoms of a cold or flu. Do not treat yourself. This drug decreases your body's ability to fight infections. Try to avoid being around people who are sick.  This medicine may increase your risk to bruise or bleed. Call your doctor or health care professional if you notice any unusual bleeding.  Be careful brushing and flossing your teeth or using a toothpick because you may get an infection or bleed more easily. If you have any dental work done, tell your dentist you are receiving this medicine.  Avoid taking products that contain aspirin, acetaminophen, ibuprofen, naproxen, or ketoprofen unless instructed by your doctor. These medicines may hide a fever.  Do not become pregnant while taking this medicine. Women should inform their doctor if they wish to become pregnant or think they might be pregnant. There is a potential for serious side effects to an unborn child. Talk to your health care professional or pharmacist for more information. Do not breast-feed an infant while taking this medicine.  What side effects may I notice from receiving this   medicine?  Side effects that you should report to your doctor or health care professional as soon as possible:  -allergic reactions like skin rash, itching or hives, swelling of the face, lips, or tongue  -low blood counts - this medicine may decrease the number of white blood cells, red blood cells and platelets. You may be at increased risk for infections and bleeding.  -signs of infection - fever or chills, cough, sore  throat, pain or difficulty passing urine  -signs of decreased platelets or bleeding - bruising, pinpoint red spots on the skin, black, tarry stools, blood in the urine  -signs of decreased red blood cells - unusually weak or tired, fainting spells, lightheadedness  -breathing problems  -changes in vision  -mouth or throat sores or ulcers  -pain, redness, swelling or irritation at the injection site  -pain, tingling, numbness in the hands or feet  -redness, blistering, peeling or loosening of the skin, including inside the mouth  -seizures  -vomiting  Side effects that usually do not require medical attention (report to your doctor or health care professional if they continue or are bothersome):  -diarrhea  -hair loss  -loss of appetite  -nausea  -stomach pain  This list may not describe all possible side effects. Call your doctor for medical advice about side effects. You may report side effects to FDA at 1-800-FDA-1088.  Where should I keep my medicine?  This drug is given in a hospital or clinic and will not be stored at home.  NOTE: This sheet is a summary. It may not cover all possible information. If you have questions about this medicine, talk to your doctor, pharmacist, or health care provider.   2015, [employer]. (2007-06-14 17:24:12)  Carboplatin injection  What is this medicine?  CARBOPLATIN (KAR boe pla tin) is a chemotherapy drug. It targets fast dividing cells, like cancer cells, and causes these cells to die. This medicine is used to treat ovarian cancer and many other cancers.  This medicine may be used for other purposes; ask your health care provider or pharmacist if you have questions.  COMMON BRAND NAME(S): Paraplatin  What should I tell my health care provider before I take this medicine?  They need to know if you have any of these conditions:  -blood disorders  -hearing problems  -kidney disease  -recent or ongoing radiation therapy  -an unusual or allergic reaction to carboplatin,  cisplatin, other chemotherapy, other medicines, foods, dyes, or preservatives  -pregnant or trying to get pregnant  -breast-feeding  How should I use this medicine?  This drug is usually given as an infusion into a vein. It is administered in a hospital or clinic by a specially trained health care professional.  Talk to your pediatrician regarding the use of this medicine in children. Special care may be needed.  Overdosage: If you think you have taken too much of this medicine contact a poison control center or emergency room at once.  NOTE: This medicine is only for you. Do not share this medicine with others.  What if I miss a dose?  It is important not to miss a dose. Call your doctor or health care professional if you are unable to keep an appointment.  What may interact with this medicine?  -medicines for seizures  -medicines to increase blood counts like filgrastim, pegfilgrastim, sargramostim  -some antibiotics like amikacin, gentamicin, neomycin, streptomycin, tobramycin  -vaccines  Talk to your doctor or health care professional before taking any of these   medicines:  -acetaminophen  -aspirin  -ibuprofen  -ketoprofen  -naproxen  This list may not describe all possible interactions. Give your health care provider a list of all the medicines, herbs, non-prescription drugs, or dietary supplements you use. Also tell them if you smoke, drink alcohol, or use illegal drugs. Some items may interact with your medicine.  What should I watch for while using this medicine?  Your condition will be monitored carefully while you are receiving this medicine. You will need important blood work done while you are taking this medicine.  This drug may make you feel generally unwell. This is not uncommon, as chemotherapy can affect healthy cells as well as cancer cells. Report any side effects. Continue your course of treatment even though you feel ill unless your doctor tells you to stop.  In some cases, you may be given  additional medicines to help with side effects. Follow all directions for their use.  Call your doctor or health care professional for advice if you get a fever, chills or sore throat, or other symptoms of a cold or flu. Do not treat yourself. This drug decreases your body's ability to fight infections. Try to avoid being around people who are sick.  This medicine may increase your risk to bruise or bleed. Call your doctor or health care professional if you notice any unusual bleeding.  Be careful brushing and flossing your teeth or using a toothpick because you may get an infection or bleed more easily. If you have any dental work done, tell your dentist you are receiving this medicine.  Avoid taking products that contain aspirin, acetaminophen, ibuprofen, naproxen, or ketoprofen unless instructed by your doctor. These medicines may hide a fever.  Do not become pregnant while taking this medicine. Women should inform their doctor if they wish to become pregnant or think they might be pregnant. There is a potential for serious side effects to an unborn child. Talk to your health care professional or pharmacist for more information. Do not breast-feed an infant while taking this medicine.  What side effects may I notice from receiving this medicine?  Side effects that you should report to your doctor or health care professional as soon as possible:  -allergic reactions like skin rash, itching or hives, swelling of the face, lips, or tongue  -signs of infection - fever or chills, cough, sore throat, pain or difficulty passing urine  -signs of decreased platelets or bleeding - bruising, pinpoint red spots on the skin, black, tarry stools, nosebleeds  -signs of decreased red blood cells - unusually weak or tired, fainting spells, lightheadedness  -breathing problems  -changes in hearing  -changes in vision  -chest pain  -high blood pressure  -low blood counts - This drug may decrease the number of white blood cells, red  blood cells and platelets. You may be at increased risk for infections and bleeding.  -nausea and vomiting  -pain, swelling, redness or irritation at the injection site  -pain, tingling, numbness in the hands or feet  -problems with balance, talking, walking  -trouble passing urine or change in the amount of urine  Side effects that usually do not require medical attention (report to your doctor or health care professional if they continue or are bothersome):  -hair loss  -loss of appetite  -metallic taste in the mouth or changes in taste  This list may not describe all possible side effects. Call your doctor for medical advice about side effects. You may report side

## 2014-09-04 NOTE — Progress Notes (Addendum)
[place] [phone]   Fax:([phone]) [place], MD [address] 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  PRIOR THERAPY: 1. First-line chemotherapy with 5 cycles of carboplatin (cisplatin for the first cycle) and etoposide from 05/18/2013 to 10/15/2013.  2. WB XRT and palliative chest radiation given on 05/03/13 - 05/18/13 per Dr. Pablo Ledger.  CURRENT THERAPY: Systemic chemotherapy with carboplatin for an AUC of 5 given on day 1 and etoposide 100 mg/m given on days 1, 2 and 3 with Neulasta support on day 4. First cycle expected 09/04/2014 INTERVAL HISTORY: Ricky Villanueva 64 y.o. male returns to the clinic today for follow-up visit accompanied by his wife, son and daughter. His chemotherapy was completed in August 2015 and he has been observation since that time. His recent restaging CT scan of the chest, abdomen and pelvis revealed evidence for disease progression. He presents today to proceed with systemic chemotherapy with carboplatin and etoposide with Neulasta support. Cycle 1 of this systemic chemotherapy regimen is due today. He requests prescriptions for Zofran and Compazine as the previous prescriptions that he had when he was treated in 2015 are out of date. He reports some occasional dizziness but denies syncopal episode or falls. His appetite is decreased but he is eating a bit better lately. He continues to have an occasional cough but denies any hemoptysis.  He also had right arm pain and inability to raise his arm above the shoulder. A CT of the right upper extremity performed on 08/24/2004 was negative for metastatic disease in the right upper extremity. There was mild acromioclavicular osteoarthritis present.  He denied having any significant chest pain but has mild shortness of breath. He denied having any nausea or  vomiting. He has no fever or chills.   MEDICAL HISTORY: Past Medical History  Diagnosis Date  . Hyperlipidemia   . Hypertension   . GERD (gastroesophageal reflux disease)   . Respiratory failure with hypoxia 04/21/2013    secondary to pneumonia/notes 04/21/2013  . Pulmonary embolism     "got one in there now" (04/21/2013)  . COPD (chronic obstructive pulmonary disease)   . Pneumonia 2/?01/2014    "wouldn't get better; hospitalized 04/21/2013)  . Arthritis     "minor in my right hand" (04/21/2013)  . History of radiation therapy 05/03/13-05/18/13    chest & whole brain  . Cancer     lung ca dx'd 03/2013  . Colon polyps     hyperplastic  . Right arm pain 08/21/2014    ALLERGIES:  is allergic to decadron and augmentin.  MEDICATIONS:  Current Outpatient Prescriptions  Medication Sig Dispense Refill  . albuterol (PROVENTIL HFA;VENTOLIN HFA) 108 (90 BASE) MCG/ACT inhaler Inhale 2 puffs into the lungs every 6 (six) hours as needed for wheezing or shortness of breath. 1 Inhaler 2  . chlorpheniramine-HYDROcodone (TUSSIONEX PENNKINETIC ER) 10-8 MG/5ML SUER Take 5 mLs by mouth every 12 (twelve) hours as needed for cough. 140 mL 0  . docusate sodium (COLACE) 100 MG capsule Take 300 mg by mouth 2 (two) times daily as needed for mild constipation.     . Iron-Vitamins (GERITOL PO) Take 1 tablet by mouth daily.    [name] lidocaine-prilocaine (EMLA) cream Apply to port site one hour before treatment and cover with plastic wrap. 1 each 2  . LORazepam (ATIVAN) 0.5 MG tablet  Take 1 tablet (0.5 mg total) by mouth 2 (two) times daily. for anxiety (Patient taking differently: Take 0.5 mg by mouth 2 (two) times daily. ) 60 tablet 3  . magnesium hydroxide (MILK OF MAGNESIA) 400 MG/5ML suspension Take 5 mLs by mouth daily as needed for mild constipation.    . methylPREDNISolone (MEDROL DOSEPAK) 4 MG TBPK tablet Use as instructed 21 tablet 0  . mirtazapine (REMERON) 30 MG tablet Take 1 tablet (30 mg total) by mouth at  bedtime. 90 tablet 1  . morphine (MS CONTIN) 30 MG 12 hr tablet Take 1 tablet (30 mg total) by mouth every 12 (twelve) hours. 60 tablet 0  . omeprazole (PRILOSEC) 40 MG capsule Take 40 mg by mouth daily.     . ondansetron (ZOFRAN) 8 MG tablet Take 1 tablet (8 mg total) by mouth every 8 (eight) hours as needed for nausea or vomiting (nausea). 20 tablet 3  . oxyCODONE (OXY IR/ROXICODONE) 5 MG immediate release tablet Take 1 tablet (5 mg total) by mouth every 6 (six) hours as needed for severe pain. 120 tablet 0  . polyethylene glycol powder (MIRALAX) powder Take 17 g by mouth daily. (Patient taking differently: Take 17 g by mouth daily as needed for mild constipation or moderate constipation. ) 255 g 0  . prochlorperazine (COMPAZINE) 10 MG tablet Take 1 tablet (10 mg total) by mouth every 6 (six) hours as needed for nausea or vomiting (nausea). 30 tablet 3  . rivaroxaban (XARELTO) 20 MG TABS tablet Take 1 tablet (20 mg total) by mouth daily with supper. 30 tablet 1  . tiotropium (SPIRIVA HANDIHALER) 18 MCG inhalation capsule Place 1 capsule (18 mcg total) into inhaler and inhale daily. 90 capsule 1  . vitamin B-12 (CYANOCOBALAMIN) 100 MCG tablet Take 100 mcg by mouth daily.    [name] dextromethorphan 15 MG/5ML syrup Take 10 mLs by mouth as needed for cough.     No current facility-administered medications for this visit.   Facility-Administered Medications Ordered in Other Visits  Medication Dose Route Frequency Provider Last Rate Last Dose  . 0.9 %  sodium chloride infusion  1,000 mL Intravenous Once Concha Norway, MD      . 0.9 %  sodium chloride infusion   Intravenous Once Concha Norway, MD      . etoposide (VEPESID) 150 mg in sodium chloride 0.9 % 500 mL chemo infusion  100 mg/m2 (Treatment Plan Actual) Intravenous Once Curt Bears, MD 508 mL/hr at 09/04/14 1242 150 mg at 09/04/14 1242  . heparin lock flush 100 unit/mL  500 Units Intracatheter Once PRN Curt Bears, MD      . sodium chloride 0.9  % injection 10 mL  10 mL Intracatheter PRN Curt Bears, MD        SURGICAL HISTORY:  Past Surgical History  Procedure Laterality Date  . Inguinal hernia repair Right ~ 2005  . Finger surgery Left ~ 1989    "crushed end of my middle finger off"   . Video bronchoscopy Bilateral 04/26/2013    Procedure: VIDEO BRONCHOSCOPY WITH FLUORO;  Surgeon: Wilhelmina Mcardle, MD;  Location: [facility] ENDOSCOPY;  Service: Cardiopulmonary;  Laterality: Bilateral;    REVIEW OF SYSTEMS:  Constitutional: positive for fatigue Eyes: negative Ears, nose, mouth, throat, and face: negative Respiratory: positive for cough and dyspnea on exertion Cardiovascular: negative Gastrointestinal: negative Genitourinary:negative Integument/breast: negative Hematologic/lymphatic: negative Musculoskeletal:negative Neurological: negative Behavioral/Psych: negative Endocrine: negative Allergic/Immunologic: negative   PHYSICAL EXAMINATION: General appearance: alert, cooperative, fatigued and no  distress Head: Normocephalic, without obvious abnormality, atraumatic Neck: no adenopathy, no JVD, supple, symmetrical, trachea midline and thyroid not enlarged, symmetric, no tenderness/mass/nodules Lymph nodes: Cervical, supraclavicular, and axillary nodes normal. Resp: clear to auscultation bilaterally Back: symmetric, no curvature. ROM normal. No CVA tenderness. Cardio: regular rate and rhythm, S1, S2 normal, no murmur, click, rub or gallop GI: soft, non-tender; bowel sounds normal; no masses,  no organomegaly Extremities: extremities normal, atraumatic, no cyanosis or edema Neurologic: Alert and oriented X 3, normal strength and tone. Normal symmetric reflexes. Normal coordination and gait  ECOG PERFORMANCE STATUS: 1 - Symptomatic but completely ambulatory  Blood pressure 112/85, pulse 79, temperature 97.8 F (36.6 C), temperature source Oral, resp. rate 17, height '5\' 7"'$  (1.702 m), weight 107 lb 9.6 oz (48.807 kg), SpO2 96  %.  LABORATORY DATA: Lab Results  Component Value Date   WBC 5.6 09/04/2014   HGB 12.9* 09/04/2014   HCT 37.8* 09/04/2014   MCV 96.2 09/04/2014   PLT 231 09/04/2014      Chemistry      Component Value Date/Time   NA 140 09/04/2014 0852   NA 139 09/01/2014 1313   K 3.9 09/04/2014 0852   K 4.5 09/01/2014 1313   CL 103 09/01/2014 1313   CO2 27 09/04/2014 0852   CO2 27 09/01/2014 1313   BUN 9.0 09/04/2014 0852   BUN 11 09/01/2014 1313   CREATININE 0.8 09/04/2014 0852   CREATININE 0.92 09/01/2014 1313      Component Value Date/Time   CALCIUM 9.0 09/04/2014 0852   CALCIUM 9.5 09/01/2014 1313   ALKPHOS 68 09/04/2014 0852   ALKPHOS 67 09/01/2014 1313   AST 14 09/04/2014 0852   AST 20 09/01/2014 1313   ALT 10 09/04/2014 0852   ALT 14* 09/01/2014 1313   BILITOT 0.31 09/04/2014 0852   BILITOT 0.9 09/01/2014 1313       RADIOGRAPHIC STUDIES: Dg Chest 2 View  09/01/2014   CLINICAL DATA:  Hemoptysis. Fever and cough for 2 days. Right small cell lung carcinoma.  EXAM: CHEST  2 VIEW  COMPARISON:  Recent chest CT on 08/14/2014  FINDINGS: Right lower lobe airspace disease shows no significant interval change. Left lung is clear . Pulmonary emphysema again noted. No evidence of pleural effusion. Heart size is normal. Right-sided Port-A-Cath remains in appropriate position.  IMPRESSION: Right lower lobe airspace disease, without significant change compared to recent chest CT.  Emphysema.   Electronically Signed   By: Earle Gell M.D.   On: 09/01/2014 14:28   Ct Chest W Contrast  08/14/2014   CLINICAL DATA:  Restaging small cell lung cancer  EXAM: CT CHEST, ABDOMEN, AND PELVIS WITH CONTRAST  TECHNIQUE: Multidetector CT imaging of the chest, abdomen and pelvis was performed following the standard protocol during bolus administration of intravenous contrast.  CONTRAST:  73m OMNIPAQUE IOHEXOL 300 MG/ML SOLN, 1057mOMNIPAQUE IOHEXOL 300 MG/ML SOLN  COMPARISON:  06/15/2014  FINDINGS: CT CHEST  FINDINGS  Chest wall: No chest wall mass, supraclavicular or axillary lymphadenopathy. The thyroid gland appears normal. The bony thorax is intact. No destructive bone lesions or spinal canal compromise.  Mediastinum: The heart is normal in size. No pericardial effusion. Stable aortic and coronary artery calcifications. No focal aneurysm or dissection. The esophagus is grossly normal. Stable ill-defined low-attenuation tissue in the subcarinal space which could be treated tumor and/or fluid. I do not see a discrete mass or nodule. Small right hilar lymph nodes appears stable.  Lungs/pleura: Progressive right  lower lobe masslike densities with a somewhat branching pattern most likely branching endobronchial tumor with significant surrounding inflammation or interstitial spread of tumor. Radiation fibrosis is also likely contributory. The larger more proximal masslike lesion in the right lower lobe on image number 42 measures 32 x 19 mm. This previously measured 19.5 x 13.5 mm. The more inferior nodular density on image number 48 measures approximately 43 x 28 mm and previously measured 26 x 14.5 mm.  Increasing nodular density in the paramediastinal right upper lobe. Could not exclude tumor. The nodularity is atypical for radiation change.  Stable underlying emphysematous changes.  No left lung pulmonary lesions.  CT ABDOMEN AND PELVIS FINDINGS  Two new vague right hepatic lobe liver lesions are worrisome for metastatic disease. The larger lesion measures 11 mm on image number 65. There is an enlarging subdiaphragmatic lymph node adjacent to the liver on image number 54 which measures 10 mm.  The spleen, adrenal glands and kidneys are unremarkable and stable. Stable small renal cysts.  The gallbladder is unremarkable. There is persistent common bile duct dilatation of uncertain etiology. No obvious distal common bile duct stone, ampullary lesion or pancreatic head mass. There is moderate pancreatic atrophy and a  slightly prominent main pancreatic duct. ERCP or MRCP may be helpful for further evaluation.  The stomach, duodenum, small bowel and colon are grossly normal. No inflammatory changes, mass lesions or obstructive findings.  Stable aortic atherosclerotic calcifications but no aneurysm or dissection. There are small scattered mesenteric and retroperitoneal lymph nodes but no mass or adenopathy.  The bladder, prostate gland and seminal vesicles are unremarkable. No pelvic mass or adenopathy. No free pelvic fluid collections. No inguinal mass or adenopathy.  No significant bony findings. No obvious osseous metastatic disease.  IMPRESSION: 1. Progression of multiple ill-defined soft tissue masses in the right lower lobe and extensive interstitial thickening. Findings suggest progressive tumor and radiation change versus interstitial spread of tumor. 2. Progressive nodularity in the right upper lobe worrisome for tumor. 3. No enlarging mediastinal or hilar lymph nodes. Stable sub carinal low-attenuation soft tissue density and/or fluid. 4. Enlarging subdiaphragmatic lymph node and findings suspicious for new hepatic metastatic disease. 5. Stable common bile duct dilatation.   Electronically Signed   By: Marijo Sanes M.D.   On: 08/14/2014 13:53   Ct Angio Chest Pe W/cm &/or Wo Cm  09/01/2014   CLINICAL DATA:  Patient with increasing shortness of breath. Hemoptysis. History of lung cancer.  EXAM: CT ANGIOGRAPHY CHEST WITH CONTRAST  TECHNIQUE: Multidetector CT imaging of the chest was performed using the standard protocol during bolus administration of intravenous contrast. Multiplanar CT image reconstructions and MIPs were obtained to evaluate the vascular anatomy.  CONTRAST:  163m OMNIPAQUE IOHEXOL 350 MG/ML SOLN  COMPARISON:  Chest CT 08/14/2014  FINDINGS: Mediastinum/Nodes: Right anterior chest wall Port-A-Cath is present. Interval development of a 1.6 cm right hilar lymph node. Visualized thyroid is unremarkable.  Heart is normal in size.  Extensive motion artifact particularly within the lower lobe limits evaluation. Nondiagnostic exam within the right lower lobe pulmonary arteries due to motion artifact. Within the above limitation no definite evidence for pulmonary embolism. The distal segmental and subsegmental pulmonary arteries are poorly evaluated.  Lungs/Pleura: Central airways are patent. When compared to prior evaluation there has been interval increase in size of masslike opacification within the right lower hemi thorax measuring 3.8 x 2.4 cm, previously 3.2 x 1.9 cm (image 68; series 7). There has been interval increase in  surrounding ground-glass and consolidative pulmonary opacities. Additional right lower lobe mass measuring 3.3 x 3.7 cm (image 74; series 7) appears increased in size. Interval increase in size of nodularity within the paramediastinal right upper lobe with a reference nodule measuring 1.5 x 0.9 cm (image 57; series 7, previously 0.6 x 0.7 cm. Additional adjacent nodules have increased in size. Extensive centrilobular and paraseptal emphysematous change. Stable subpleural opacity within the left lower lobe (image 63; series 7). No definite pleural effusion or pneumothorax.  Upper abdomen: Unchanged 10 mm supradiaphragmatic node (image 85; series 4). Interval increase in size of lateral left hepatic lobe lesion measuring 2.0 x 1.7 cm, previously 1.2 x 1.0 cm (image 96; series 4).  Musculoskeletal: No aggressive or acute appearing osseous lesions.  Review of the MIP images confirms the above findings.  IMPRESSION: Limited exam for pulmonary embolism given motion artifact. Nondiagnostic exam to evaluate the right lower lobe pulmonary arteries due to motion artifact. Within the above limitation no definite pulmonary embolism identified. Limited evaluation of the distal segmental and subsegmental pulmonary arteries.  When compared to recent prior evaluation there has been subsequent interval increase  in size of large masslike areas of consolidation within the right lower lobe concerning for disease progression. Interval increase in surrounding ground-glass nodularity concerning for the possibility of lymphangitic spread of tumor. Additionally a portion of this opacity may be secondary to hemorrhage.  Interval development of an enlarged right hilar lymph node.  Interval increase in right upper lobe nodularity compatible with disease progression.  Interval increase in size of hepatic lesion, compatible with disease progression.   Electronically Signed   By: Lovey Newcomer M.D.   On: 09/01/2014 16:05   Ct Abdomen Pelvis W Contrast  08/14/2014   CLINICAL DATA:  Restaging small cell lung cancer  EXAM: CT CHEST, ABDOMEN, AND PELVIS WITH CONTRAST  TECHNIQUE: Multidetector CT imaging of the chest, abdomen and pelvis was performed following the standard protocol during bolus administration of intravenous contrast.  CONTRAST:  78m OMNIPAQUE IOHEXOL 300 MG/ML SOLN, 1046mOMNIPAQUE IOHEXOL 300 MG/ML SOLN  COMPARISON:  06/15/2014  FINDINGS: CT CHEST FINDINGS  Chest wall: No chest wall mass, supraclavicular or axillary lymphadenopathy. The thyroid gland appears normal. The bony thorax is intact. No destructive bone lesions or spinal canal compromise.  Mediastinum: The heart is normal in size. No pericardial effusion. Stable aortic and coronary artery calcifications. No focal aneurysm or dissection. The esophagus is grossly normal. Stable ill-defined low-attenuation tissue in the subcarinal space which could be treated tumor and/or fluid. I do not see a discrete mass or nodule. Small right hilar lymph nodes appears stable.  Lungs/pleura: Progressive right lower lobe masslike densities with a somewhat branching pattern most likely branching endobronchial tumor with significant surrounding inflammation or interstitial spread of tumor. Radiation fibrosis is also likely contributory. The larger more proximal masslike lesion in  the right lower lobe on image number 42 measures 32 x 19 mm. This previously measured 19.5 x 13.5 mm. The more inferior nodular density on image number 48 measures approximately 43 x 28 mm and previously measured 26 x 14.5 mm.  Increasing nodular density in the paramediastinal right upper lobe. Could not exclude tumor. The nodularity is atypical for radiation change.  Stable underlying emphysematous changes.  No left lung pulmonary lesions.  CT ABDOMEN AND PELVIS FINDINGS  Two new vague right hepatic lobe liver lesions are worrisome for metastatic disease. The larger lesion measures 11 mm on image number 65. There is an  enlarging subdiaphragmatic lymph node adjacent to the liver on image number 54 which measures 10 mm.  The spleen, adrenal glands and kidneys are unremarkable and stable. Stable small renal cysts.  The gallbladder is unremarkable. There is persistent common bile duct dilatation of uncertain etiology. No obvious distal common bile duct stone, ampullary lesion or pancreatic head mass. There is moderate pancreatic atrophy and a slightly prominent main pancreatic duct. ERCP or MRCP may be helpful for further evaluation.  The stomach, duodenum, small bowel and colon are grossly normal. No inflammatory changes, mass lesions or obstructive findings.  Stable aortic atherosclerotic calcifications but no aneurysm or dissection. There are small scattered mesenteric and retroperitoneal lymph nodes but no mass or adenopathy.  The bladder, prostate gland and seminal vesicles are unremarkable. No pelvic mass or adenopathy. No free pelvic fluid collections. No inguinal mass or adenopathy.  No significant bony findings. No obvious osseous metastatic disease.  IMPRESSION: 1. Progression of multiple ill-defined soft tissue masses in the right lower lobe and extensive interstitial thickening. Findings suggest progressive tumor and radiation change versus interstitial spread of tumor. 2. Progressive nodularity in the  right upper lobe worrisome for tumor. 3. No enlarging mediastinal or hilar lymph nodes. Stable sub carinal low-attenuation soft tissue density and/or fluid. 4. Enlarging subdiaphragmatic lymph node and findings suspicious for new hepatic metastatic disease. 5. Stable common bile duct dilatation.   Electronically Signed   By: Marijo Sanes M.D.   On: 08/14/2014 13:53   Ct Extrem Up Entire Arm R W/cm  08/25/2014   CLINICAL DATA:  Right upper arm pain and swelling for 2 months, worsening. History of metastatic lung carcinoma on the right diagnosed March, 2015. Question metastatic disease to the right arm.  EXAM: CT OF THE UPPER RIGHT ARM WITH CONTRAST  TECHNIQUE: Contiguous axial CT images were taken through the right arm after the administration of IV contrast material. Coronal and sagittal reconstruction images are provided.  CONTRAST:  100 mL OMNIPAQUE IOHEXOL 300 MG/ML  SOLN  COMPARISON:  CT chest 08/14/2014.  FINDINGS: No soft tissue mass or fluid collection is identified. No lytic or sclerotic bony lesion is seen. There is no fracture. Major caliber vascular structures enhance normally. All imaged muscles and tendons appear intact. Mild appearing acromioclavicular degenerative change is noted. There is partial visualization of dense airspace opacity in the right lower lobe as seen on the patient's recent chest CT. The lungs are emphysematous.  IMPRESSION: Negative for metastatic disease the right arm. No finding to explain the patient's symptoms is identified.  Mild acromioclavicular osteoarthritis.  Dense right lower lobe airspace opacity consistent with lung carcinoma is partially visualized but not grossly changed compared to the patient's recent chest CT.  Emphysema.   Electronically Signed   By: Inge Rise M.D.   On: 08/25/2014 09:38    ASSESSMENT AND PLAN: This is a very pleasant 64 years old white male with extensive stage small cell lung cancer status post palliative radiotherapy to the brain  and chest as well as 6 cycles of systemic chemotherapy with carboplatin and etoposide completed in August 2015. The patient has been observation for the last 10 months. The recent CT scan of the chest, abdomen and pelvis showed evidence for disease progression with multiple ill-defined soft tissue masses in the right lower lobe and an enlarging subdiaphragmatic lymph nodes and a new suspicious hepatic metastasis. He presents to proceed with  systemic chemotherapy with carboplatin for AUC of 5 on day 1 and etoposide 100  MG/M2 on days 1, 2 and 3 with Neulasta support on day 4. Prescriptions for Zofran and Compazine were sent to his pharmacy of record via E scribe. Patient was discussed with and also seen by Dr. [name]. He'll proceed with day 1 of cycle #1 of his systemic chemotherapy with carboplatin and etoposide today as scheduled. We will plan to see him in one week for symptom management visit to make sure he is tolerating his systemic chemotherapy. He will continue with weekly labs as scheduled. We'll continue to monitor his complaints of the right upper extremity pain.  The patient was advised to call if he has any concerning symptoms in the interval. The patient voices understanding of current disease status and treatment options and is in agreement with the current care plan.  All questions were answered. The patient knows to call the clinic with any problems, questions or concerns. We can certainly see the patient much sooner if necessary.  Carlton Adam, PA-C 09/04/2014   ADDENDUM: Hematology/Oncology Attending: I had a face to face encounter with the patient today. I recommended his care plan. This is a very pleasant 64 years old white male with recently progressive small cell lung cancer. I recommended for the patient to start systemic chemotherapy with carboplatin and etoposide. He is scheduled to receive the first dose of his treatment today. He is feeling much better these days and  drinking more fluids. He was seen recently at the emergency department for evaluation of mild hemoptysis at was secondary to severe cough. He is feeling much better with no more hemoptysis. I recommended for the patient to proceed with the treatment today as a scheduled. He would come back for follow-up visit in one week for reevaluation and management of any adverse effect of his treatment. The patient was advised to call immediately if he has any concerning symptoms in the interval.  Disclaimer: This note was dictated with voice recognition software. Similar sounding words can inadvertently be transcribed and may be missed upon review. Eilleen Kempf., MD 09/04/2014

## 2014-09-04 NOTE — Patient Instructions (Signed)
Continue wekly labs as scheduled Follow in 1 week

## 2014-09-05 ENCOUNTER — Ambulatory Visit (HOSPITAL_BASED_OUTPATIENT_CLINIC_OR_DEPARTMENT_OTHER): Payer: BLUE CROSS/BLUE SHIELD

## 2014-09-05 ENCOUNTER — Telehealth: Payer: Self-pay | Admitting: Internal Medicine

## 2014-09-05 ENCOUNTER — Encounter: Payer: Self-pay | Admitting: General Practice

## 2014-09-05 VITALS — BP 103/74 | HR 68 | Temp 97.7°F | Resp 18

## 2014-09-05 DIAGNOSIS — C7A1 Malignant poorly differentiated neuroendocrine tumors: Secondary | ICD-10-CM | POA: Diagnosis not present

## 2014-09-05 DIAGNOSIS — C3491 Malignant neoplasm of unspecified part of right bronchus or lung: Secondary | ICD-10-CM

## 2014-09-05 DIAGNOSIS — C7B8 Other secondary neuroendocrine tumors: Secondary | ICD-10-CM

## 2014-09-05 DIAGNOSIS — Z5111 Encounter for antineoplastic chemotherapy: Secondary | ICD-10-CM | POA: Diagnosis not present

## 2014-09-05 MED ORDER — PROCHLORPERAZINE MALEATE 10 MG PO TABS
ORAL_TABLET | ORAL | Status: AC
  Filled 2014-09-05: qty 1

## 2014-09-05 MED ORDER — SODIUM CHLORIDE 0.9 % IV SOLN
100.0000 mg/m2 | Freq: Once | INTRAVENOUS | Status: AC
  Administered 2014-09-05: 150 mg via INTRAVENOUS
  Filled 2014-09-05: qty 7.5

## 2014-09-05 MED ORDER — SODIUM CHLORIDE 0.9 % IJ SOLN
10.0000 mL | INTRAMUSCULAR | Status: DC | PRN
  Administered 2014-09-05: 10 mL
  Filled 2014-09-05: qty 10

## 2014-09-05 MED ORDER — PROCHLORPERAZINE MALEATE 10 MG PO TABS
10.0000 mg | ORAL_TABLET | Freq: Once | ORAL | Status: AC
  Administered 2014-09-05: 10 mg via ORAL

## 2014-09-05 MED ORDER — HEPARIN SOD (PORK) LOCK FLUSH 100 UNIT/ML IV SOLN
500.0000 [IU] | Freq: Once | INTRAVENOUS | Status: AC | PRN
  Administered 2014-09-05: 500 [IU]
  Filled 2014-09-05: qty 5

## 2014-09-05 MED ORDER — SODIUM CHLORIDE 0.9 % IV SOLN
Freq: Once | INTRAVENOUS | Status: AC
  Administered 2014-09-05: 09:00:00 via INTRAVENOUS

## 2014-09-05 NOTE — Progress Notes (Signed)
Spiritual Care Note  Received phone call from Mr Maalouf spouse [name], requesting assistance with Advance Directives.  Met couple in infusion room, reviewing documents with them.  Consulted with Polo Riley, LCSW, who will serve as notary to complete documents.  Family expressed thanks and is aware of ongoing chaplain availability for spiritual/emotional support as desired.  Please also page as needs arise.  Thank you.  [place], [place] Pager [phone] VM [phone]

## 2014-09-05 NOTE — Telephone Encounter (Signed)
pt called with concerns about a difference in prescriptions....transferred to desk nurse

## 2014-09-05 NOTE — Patient Instructions (Addendum)
[facility] Discharge Instructions for Patients Receiving Chemotherapy  Today you received the following chemotherapy agents: Etoposide.   To help prevent nausea and vomiting after your treatment, we encourage you to take your nausea medication as directed.   If you develop nausea and vomiting that is not controlled by your nausea medication, call the clinic.   BELOW ARE SYMPTOMS THAT SHOULD BE REPORTED IMMEDIATELY:  *FEVER GREATER THAN 100.5 F  *CHILLS WITH OR WITHOUT FEVER  NAUSEA AND VOMITING THAT IS NOT CONTROLLED WITH YOUR NAUSEA MEDICATION  *UNUSUAL SHORTNESS OF BREATH  *UNUSUAL BRUISING OR BLEEDING  TENDERNESS IN MOUTH AND THROAT WITH OR WITHOUT PRESENCE OF ULCERS  *URINARY PROBLEMS  *BOWEL PROBLEMS  UNUSUAL RASH Items with * indicate a potential emergency and should be followed up as soon as possible.  Feel free to call the clinic you have any questions or concerns. The clinic phone number is [phone].  Please show the [facility] at check-in to the Emergency Department and triage nurse.  Etoposide, VP-16 injection What is this medicine? ETOPOSIDE, VP-16 (e toe POE side) is a chemotherapy drug. It is used to treat testicular cancer, lung cancer, and other cancers. This medicine may be used for other purposes; ask your health care provider or pharmacist if you have questions. COMMON BRAND NAME(S): Etopophos, Toposar, VePesid What should I tell my health care provider before I take this medicine? They need to know if you have any of these conditions: -infection -kidney disease -low blood counts, like low white cell, platelet, or red cell counts -an unusual or allergic reaction to etoposide, other chemotherapeutic agents, other medicines, foods, dyes, or preservatives -pregnant or trying to get pregnant -breast-feeding How should I use this medicine? This medicine is for infusion into a vein. It is administered in a hospital or clinic by a  specially trained health care professional. Talk to your pediatrician regarding the use of this medicine in children. Special care may be needed. Overdosage: If you think you have taken too much of this medicine contact a poison control center or emergency room at once. NOTE: This medicine is only for you. Do not share this medicine with others. What if I miss a dose? It is important not to miss your dose. Call your doctor or health care professional if you are unable to keep an appointment. What may interact with this medicine? -cyclosporine -medicines to increase blood counts like filgrastim, pegfilgrastim, sargramostim -vaccines This list may not describe all possible interactions. Give your health care provider a list of all the medicines, herbs, non-prescription drugs, or dietary supplements you use. Also tell them if you smoke, drink alcohol, or use illegal drugs. Some items may interact with your medicine. What should I watch for while using this medicine? Visit your doctor for checks on your progress. This drug may make you feel generally unwell. This is not uncommon, as chemotherapy can affect healthy cells as well as cancer cells. Report any side effects. Continue your course of treatment even though you feel ill unless your doctor tells you to stop. In some cases, you may be given additional medicines to help with side effects. Follow all directions for their use. Call your doctor or health care professional for advice if you get a fever, chills or sore throat, or other symptoms of a cold or flu. Do not treat yourself. This drug decreases your body's ability to fight infections. Try to avoid being around people who are sick. This medicine may  increase your risk to bruise or bleed. Call your doctor or health care professional if you notice any unusual bleeding. Be careful brushing and flossing your teeth or using a toothpick because you may get an infection or bleed more easily. If you have  any dental work done, tell your dentist you are receiving this medicine. Avoid taking products that contain aspirin, acetaminophen, ibuprofen, naproxen, or ketoprofen unless instructed by your doctor. These medicines may hide a fever. Do not become pregnant while taking this medicine. Women should inform their doctor if they wish to become pregnant or think they might be pregnant. There is a potential for serious side effects to an unborn child. Talk to your health care professional or pharmacist for more information. Do not breast-feed an infant while taking this medicine. What side effects may I notice from receiving this medicine? Side effects that you should report to your doctor or health care professional as soon as possible: -allergic reactions like skin rash, itching or hives, swelling of the face, lips, or tongue -low blood counts - this medicine may decrease the number of white blood cells, red blood cells and platelets. You may be at increased risk for infections and bleeding. -signs of infection - fever or chills, cough, sore throat, pain or difficulty passing urine -signs of decreased platelets or bleeding - bruising, pinpoint red spots on the skin, black, tarry stools, blood in the urine -signs of decreased red blood cells - unusually weak or tired, fainting spells, lightheadedness -breathing problems -changes in vision -mouth or throat sores or ulcers -pain, redness, swelling or irritation at the injection site -pain, tingling, numbness in the hands or feet -redness, blistering, peeling or loosening of the skin, including inside the mouth -seizures -vomiting Side effects that usually do not require medical attention (report to your doctor or health care professional if they continue or are bothersome): -diarrhea -hair loss -loss of appetite -nausea -stomach pain This list may not describe all possible side effects. Call your doctor for medical advice about side effects. You may  report side effects to FDA at 1-800-FDA-1088. Where should I keep my medicine? This drug is given in a hospital or clinic and will not be stored at home. NOTE: This sheet is a summary. It may not cover all possible information. If you have questions about this medicine, talk to your doctor, pharmacist, or health care provider.  2015, [employer]. (2007-06-14 17:24:12)

## 2014-09-06 ENCOUNTER — Ambulatory Visit (HOSPITAL_BASED_OUTPATIENT_CLINIC_OR_DEPARTMENT_OTHER): Payer: BLUE CROSS/BLUE SHIELD

## 2014-09-06 VITALS — BP 120/87 | HR 74 | Temp 97.7°F

## 2014-09-06 DIAGNOSIS — Z5111 Encounter for antineoplastic chemotherapy: Secondary | ICD-10-CM

## 2014-09-06 DIAGNOSIS — C7B8 Other secondary neuroendocrine tumors: Secondary | ICD-10-CM

## 2014-09-06 DIAGNOSIS — C7A1 Malignant poorly differentiated neuroendocrine tumors: Secondary | ICD-10-CM | POA: Diagnosis not present

## 2014-09-06 DIAGNOSIS — C3491 Malignant neoplasm of unspecified part of right bronchus or lung: Secondary | ICD-10-CM

## 2014-09-06 MED ORDER — PROCHLORPERAZINE MALEATE 10 MG PO TABS
10.0000 mg | ORAL_TABLET | Freq: Once | ORAL | Status: AC
  Administered 2014-09-06: 10 mg via ORAL

## 2014-09-06 MED ORDER — SODIUM CHLORIDE 0.9 % IJ SOLN
10.0000 mL | INTRAMUSCULAR | Status: DC | PRN
  Administered 2014-09-06: 10 mL
  Filled 2014-09-06: qty 10

## 2014-09-06 MED ORDER — SODIUM CHLORIDE 0.9 % IV SOLN
Freq: Once | INTRAVENOUS | Status: AC
  Administered 2014-09-06: 09:00:00 via INTRAVENOUS

## 2014-09-06 MED ORDER — PROCHLORPERAZINE MALEATE 10 MG PO TABS
ORAL_TABLET | ORAL | Status: AC
  Filled 2014-09-06: qty 1

## 2014-09-06 MED ORDER — HEPARIN SOD (PORK) LOCK FLUSH 100 UNIT/ML IV SOLN
500.0000 [IU] | Freq: Once | INTRAVENOUS | Status: AC | PRN
  Administered 2014-09-06: 500 [IU]
  Filled 2014-09-06: qty 5

## 2014-09-06 MED ORDER — SODIUM CHLORIDE 0.9 % IV SOLN
100.0000 mg/m2 | Freq: Once | INTRAVENOUS | Status: AC
  Administered 2014-09-06: 150 mg via INTRAVENOUS
  Filled 2014-09-06: qty 7.5

## 2014-09-06 NOTE — Patient Instructions (Signed)
[facility] Discharge Instructions for Patients Receiving Chemotherapy  Today you received the following chemotherapy agents VP16  To help prevent nausea and vomiting after your treatment, we encourage you to take your nausea medication    If you develop nausea and vomiting that is not controlled by your nausea medication, call the clinic.   BELOW ARE SYMPTOMS THAT SHOULD BE REPORTED IMMEDIATELY:  *FEVER GREATER THAN 100.5 F  *CHILLS WITH OR WITHOUT FEVER  NAUSEA AND VOMITING THAT IS NOT CONTROLLED WITH YOUR NAUSEA MEDICATION  *UNUSUAL SHORTNESS OF BREATH  *UNUSUAL BRUISING OR BLEEDING  TENDERNESS IN MOUTH AND THROAT WITH OR WITHOUT PRESENCE OF ULCERS  *URINARY PROBLEMS  *BOWEL PROBLEMS  UNUSUAL RASH Items with * indicate a potential emergency and should be followed up as soon as possible.  Feel free to call the clinic you have any questions or concerns. The clinic phone number is [phone].  Please show the CHEMO ALERT CARD at check-in to the Emergency Department and triage nurse.   

## 2014-09-08 ENCOUNTER — Telehealth: Payer: Self-pay | Admitting: Physician Assistant

## 2014-09-08 ENCOUNTER — Telehealth: Payer: Self-pay | Admitting: *Deleted

## 2014-09-08 ENCOUNTER — Encounter: Payer: Self-pay | Admitting: Physician Assistant

## 2014-09-08 ENCOUNTER — Other Ambulatory Visit (HOSPITAL_BASED_OUTPATIENT_CLINIC_OR_DEPARTMENT_OTHER): Payer: BLUE CROSS/BLUE SHIELD

## 2014-09-08 ENCOUNTER — Ambulatory Visit (HOSPITAL_BASED_OUTPATIENT_CLINIC_OR_DEPARTMENT_OTHER): Payer: BLUE CROSS/BLUE SHIELD | Admitting: Physician Assistant

## 2014-09-08 ENCOUNTER — Ambulatory Visit (HOSPITAL_BASED_OUTPATIENT_CLINIC_OR_DEPARTMENT_OTHER): Payer: BLUE CROSS/BLUE SHIELD

## 2014-09-08 ENCOUNTER — Ambulatory Visit: Payer: BLUE CROSS/BLUE SHIELD

## 2014-09-08 VITALS — BP 117/80 | HR 79 | Temp 97.7°F

## 2014-09-08 VITALS — HR 79 | Temp 98.1°F | Wt 109.5 lb

## 2014-09-08 DIAGNOSIS — M79601 Pain in right arm: Secondary | ICD-10-CM

## 2014-09-08 DIAGNOSIS — Z95828 Presence of other vascular implants and grafts: Secondary | ICD-10-CM

## 2014-09-08 DIAGNOSIS — C3491 Malignant neoplasm of unspecified part of right bronchus or lung: Secondary | ICD-10-CM

## 2014-09-08 DIAGNOSIS — C7B8 Other secondary neuroendocrine tumors: Secondary | ICD-10-CM | POA: Diagnosis not present

## 2014-09-08 DIAGNOSIS — C7A1 Malignant poorly differentiated neuroendocrine tumors: Secondary | ICD-10-CM

## 2014-09-08 DIAGNOSIS — Z5189 Encounter for other specified aftercare: Secondary | ICD-10-CM | POA: Diagnosis not present

## 2014-09-08 DIAGNOSIS — C3490 Malignant neoplasm of unspecified part of unspecified bronchus or lung: Secondary | ICD-10-CM

## 2014-09-08 LAB — COMPREHENSIVE METABOLIC PANEL (CC13)
ALK PHOS: 74 U/L (ref 40–150)
ALT: 16 U/L (ref 0–55)
ANION GAP: 5 meq/L (ref 3–11)
AST: 22 U/L (ref 5–34)
Albumin: 3.3 g/dL — ABNORMAL LOW (ref 3.5–5.0)
BUN: 10.6 mg/dL (ref 7.0–26.0)
CO2: 29 meq/L (ref 22–29)
CREATININE: 0.8 mg/dL (ref 0.7–1.3)
Calcium: 9.5 mg/dL (ref 8.4–10.4)
Chloride: 102 mEq/L (ref 98–109)
EGFR: >90 mL/min/{1.73_m2} (ref >90)
GLUCOSE: 124 mg/dL (ref 70–140)
POTASSIUM: 3.9 meq/L (ref 3.5–5.1)
Sodium: 136 mEq/L (ref 136–145)
Total Bilirubin: 0.49 mg/dL (ref 0.20–1.20)
Total Protein: 6.5 g/dL (ref 6.4–8.3)

## 2014-09-08 LAB — CBC WITH DIFFERENTIAL/PLATELET
BASO%: 0.4 % (ref 0.0–2.0)
Basophils Absolute: 0 10*3/uL (ref 0.0–0.1)
EOS ABS: 0.1 10*3/uL (ref 0.0–0.5)
EOS%: 2.7 % (ref 0.0–7.0)
HEMATOCRIT: 35.1 % — AB (ref 38.4–49.9)
HGB: 11.9 g/dL — ABNORMAL LOW (ref 13.0–17.1)
LYMPH#: 0.4 10*3/uL — AB (ref 0.9–3.3)
LYMPH%: 9.5 % — ABNORMAL LOW (ref 14.0–49.0)
MCH: 32.6 pg (ref 27.2–33.4)
MCHC: 33.9 g/dL (ref 32.0–36.0)
MCV: 96.2 fL (ref 79.3–98.0)
MONO#: 0.1 10*3/uL (ref 0.1–0.9)
MONO%: 2.8 % (ref 0.0–14.0)
NEUT#: 3.5 10*3/uL (ref 1.5–6.5)
NEUT%: 84.6 % — ABNORMAL HIGH (ref 39.0–75.0)
Platelets: 209 10*3/uL (ref 140–400)
RBC: 3.65 10*6/uL — AB (ref 4.20–5.82)
RDW: 12.3 % (ref 11.0–14.6)
WBC: 4.1 10*3/uL (ref 4.0–10.3)

## 2014-09-08 MED ORDER — PEGFILGRASTIM INJECTION 6 MG/0.6ML ~~LOC~~
6.0000 mg | PREFILLED_SYRINGE | Freq: Once | SUBCUTANEOUS | Status: AC
  Administered 2014-09-08: 6 mg via SUBCUTANEOUS
  Filled 2014-09-08: qty 0.6

## 2014-09-08 MED ORDER — SODIUM CHLORIDE 0.9 % IJ SOLN
10.0000 mL | INTRAMUSCULAR | Status: DC | PRN
  Administered 2014-09-08: 10 mL via INTRAVENOUS
  Filled 2014-09-08: qty 10

## 2014-09-08 MED ORDER — HEPARIN SOD (PORK) LOCK FLUSH 100 UNIT/ML IV SOLN
500.0000 [IU] | Freq: Once | INTRAVENOUS | Status: AC
  Administered 2014-09-08: 500 [IU] via INTRAVENOUS
  Filled 2014-09-08: qty 5

## 2014-09-08 NOTE — Progress Notes (Signed)
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  PRIOR THERAPY: 1. First-line chemotherapy with 5 cycles of carboplatin (cisplatin for the first cycle) and etoposide from 05/18/2013 to 10/15/2013.  2. WB XRT and palliative chest radiation given on 05/03/13 - 05/18/13 per Dr. Pablo Ledger.  CURRENT THERAPY: Systemic chemotherapy with carboplatin for an AUC of 5 given on day 1 and etoposide 100 mg/m given on days 1, 2 and 3 with Neulasta support on day 4. First cycle expected 09/04/2014.  INTERVAL HISTORY: Ricky Villanueva 64 y.o. male returns to the clinic today for follow-up visit accompanied by his wife. He completed his first cycle of systemic chemotherapy with carboplatin and etoposide with Neulasta support. He did have some nausea that is controlled with his current antiemetics. He did not take the Medrol Dosepak to help stimulate his appetite. His appetite is improved and is gained a little weight. He continues to have an occasional cough but denies any hemoptysis.    He denied having any significant chest pain but has mild shortness of breath. He has no fever or chills.   MEDICAL HISTORY: Past Medical History  Diagnosis Date  . Hyperlipidemia   . Hypertension   . GERD (gastroesophageal reflux disease)   . Respiratory failure with hypoxia 04/21/2013    secondary to pneumonia/notes 04/21/2013  . Pulmonary embolism     "got one in there now" (04/21/2013)  . COPD (chronic obstructive pulmonary disease)   . Pneumonia 2/?01/2014    "wouldn't get better; hospitalized 04/21/2013)  . Arthritis     "minor in my right hand" (04/21/2013)  . History of radiation therapy 05/03/13-05/18/13    chest & whole brain  . Cancer     lung ca dx'd 03/2013  . Colon polyps    hyperplastic  . Right arm pain 08/21/2014    ALLERGIES:  is allergic to decadron and augmentin.  MEDICATIONS:  Current Outpatient Prescriptions  Medication Sig Dispense Refill  . albuterol (PROVENTIL HFA;VENTOLIN HFA) 108 (90 BASE) MCG/ACT inhaler Inhale 2 puffs into the lungs every 6 (six) hours as needed for wheezing or shortness of breath. 1 Inhaler 2  . chlorpheniramine-HYDROcodone (TUSSIONEX PENNKINETIC ER) 10-8 MG/5ML SUER Take 5 mLs by mouth every 12 (twelve) hours as needed for cough. 140 mL 0  . dextromethorphan 15 MG/5ML syrup Take 10 mLs by mouth as needed for cough.    . docusate sodium (COLACE) 100 MG capsule Take 300 mg by mouth 2 (two) times daily as needed for mild constipation.     . Iron-Vitamins (GERITOL PO) Take 1 tablet by mouth daily.    [name] lidocaine-prilocaine (EMLA) cream Apply to port site one hour before treatment and cover with plastic wrap. 1 each 2  . LORazepam (ATIVAN) 0.5 MG tablet Take 1 tablet (0.5 mg total) by mouth 2 (two) times daily. for anxiety (Patient taking differently: Take 0.5 mg by mouth 2 (two) times daily. ) 60 tablet 3  . magnesium hydroxide (MILK OF MAGNESIA) 400 MG/5ML suspension Take 5 mLs by mouth daily as needed for mild constipation.    . mirtazapine (REMERON) 30 MG tablet Take 1 tablet (30 mg total) by mouth at bedtime. 90 tablet 1  . morphine (MS CONTIN) 30 MG 12 hr tablet Take 1 tablet (30 mg  total) by mouth every 12 (twelve) hours. 60 tablet 0  . omeprazole (PRILOSEC) 40 MG capsule Take 40 mg by mouth daily.     . ondansetron (ZOFRAN) 8 MG tablet Take 1 tablet (8 mg total) by mouth every 8 (eight) hours as needed for nausea or vomiting (nausea). 20 tablet 3  . oxyCODONE (OXY IR/ROXICODONE) 5 MG immediate release tablet Take 1 tablet (5 mg total) by mouth every 6 (six) hours as needed for severe pain. 120 tablet 0  . polyethylene glycol powder (MIRALAX) powder Take 17 g by mouth daily. (Patient taking differently: Take 17 g by mouth daily as  needed for mild constipation or moderate constipation. ) 255 g 0  . prochlorperazine (COMPAZINE) 10 MG tablet Take 1 tablet (10 mg total) by mouth every 6 (six) hours as needed for nausea or vomiting (nausea). 30 tablet 3  . rivaroxaban (XARELTO) 20 MG TABS tablet Take 1 tablet (20 mg total) by mouth daily with supper. 30 tablet 1  . tiotropium (SPIRIVA HANDIHALER) 18 MCG inhalation capsule Place 1 capsule (18 mcg total) into inhaler and inhale daily. 90 capsule 1  . vitamin B-12 (CYANOCOBALAMIN) 100 MCG tablet Take 100 mcg by mouth daily.     No current facility-administered medications for this visit.   Facility-Administered Medications Ordered in Other Visits  Medication Dose Route Frequency Provider Last Rate Last Dose  . 0.9 %  sodium chloride infusion  1,000 mL Intravenous Once Concha Norway, MD      . 0.9 %  sodium chloride infusion   Intravenous Once Concha Norway, MD        SURGICAL HISTORY:  Past Surgical History  Procedure Laterality Date  . Inguinal hernia repair Right ~ 2005  . Finger surgery Left ~ 1989    "crushed end of my middle finger off"   . Video bronchoscopy Bilateral 04/26/2013    Procedure: VIDEO BRONCHOSCOPY WITH FLUORO;  Surgeon: Wilhelmina Mcardle, MD;  Location: [facility] ENDOSCOPY;  Service: Cardiopulmonary;  Laterality: Bilateral;    REVIEW OF SYSTEMS:  Constitutional: positive for fatigue Eyes: negative Ears, nose, mouth, throat, and face: negative Respiratory: positive for cough and dyspnea on exertion Cardiovascular: negative Gastrointestinal: negative Genitourinary:negative Integument/breast: negative Hematologic/lymphatic: negative Musculoskeletal:negative Neurological: negative Behavioral/Psych: negative Endocrine: negative Allergic/Immunologic: negative   PHYSICAL EXAMINATION: General appearance: alert, cooperative, fatigued and no distress Head: Normocephalic, without obvious abnormality, atraumatic Neck: no adenopathy, no JVD, supple, symmetrical,  trachea midline and thyroid not enlarged, symmetric, no tenderness/mass/nodules Lymph nodes: Cervical, supraclavicular, and axillary nodes normal. Resp: clear to auscultation bilaterally Back: symmetric, no curvature. ROM normal. No CVA tenderness. Cardio: regular rate and rhythm, S1, S2 normal, no murmur, click, rub or gallop GI: soft, non-tender; bowel sounds normal; no masses,  no organomegaly Extremities: extremities normal, atraumatic, no cyanosis or edema Neurologic: Alert and oriented X 3, normal strength and tone. Normal symmetric reflexes. Normal coordination and gait  ECOG PERFORMANCE STATUS: 1 - Symptomatic but completely ambulatory  Pulse 79, temperature 98.1 F (36.7 C), temperature source Oral, weight 109 lb 8 oz (49.669 kg), SpO2 91 %.  LABORATORY DATA: Lab Results  Component Value Date   WBC 4.1 09/08/2014   HGB 11.9* 09/08/2014   HCT 35.1* 09/08/2014   MCV 96.2 09/08/2014   PLT 209 09/08/2014      Chemistry      Component Value Date/Time   NA 136 09/08/2014 0831   NA 139 09/01/2014 1313   K 3.9 09/08/2014 0831   K 4.5 09/01/2014  1313   CL 103 09/01/2014 1313   CO2 29 09/08/2014 0831   CO2 27 09/01/2014 1313   BUN 10.6 09/08/2014 0831   BUN 11 09/01/2014 1313   CREATININE 0.8 09/08/2014 0831   CREATININE 0.92 09/01/2014 1313      Component Value Date/Time   CALCIUM 9.5 09/08/2014 0831   CALCIUM 9.5 09/01/2014 1313   ALKPHOS 74 09/08/2014 0831   ALKPHOS 67 09/01/2014 1313   AST 22 09/08/2014 0831   AST 20 09/01/2014 1313   ALT 16 09/08/2014 0831   ALT 14* 09/01/2014 1313   BILITOT 0.49 09/08/2014 0831   BILITOT 0.9 09/01/2014 1313       RADIOGRAPHIC STUDIES: Dg Chest 2 View  09/01/2014   CLINICAL DATA:  Hemoptysis. Fever and cough for 2 days. Right small cell lung carcinoma.  EXAM: CHEST  2 VIEW  COMPARISON:  Recent chest CT on 08/14/2014  FINDINGS: Right lower lobe airspace disease shows no significant interval change. Left lung is clear .  Pulmonary emphysema again noted. No evidence of pleural effusion. Heart size is normal. Right-sided Port-A-Cath remains in appropriate position.  IMPRESSION: Right lower lobe airspace disease, without significant change compared to recent chest CT.  Emphysema.   Electronically Signed   By: Earle Gell M.D.   On: 09/01/2014 14:28   Ct Chest W Contrast  08/14/2014   CLINICAL DATA:  Restaging small cell lung cancer  EXAM: CT CHEST, ABDOMEN, AND PELVIS WITH CONTRAST  TECHNIQUE: Multidetector CT imaging of the chest, abdomen and pelvis was performed following the standard protocol during bolus administration of intravenous contrast.  CONTRAST:  84m OMNIPAQUE IOHEXOL 300 MG/ML SOLN, 1014mOMNIPAQUE IOHEXOL 300 MG/ML SOLN  COMPARISON:  06/15/2014  FINDINGS: CT CHEST FINDINGS  Chest wall: No chest wall mass, supraclavicular or axillary lymphadenopathy. The thyroid gland appears normal. The bony thorax is intact. No destructive bone lesions or spinal canal compromise.  Mediastinum: The heart is normal in size. No pericardial effusion. Stable aortic and coronary artery calcifications. No focal aneurysm or dissection. The esophagus is grossly normal. Stable ill-defined low-attenuation tissue in the subcarinal space which could be treated tumor and/or fluid. I do not see a discrete mass or nodule. Small right hilar lymph nodes appears stable.  Lungs/pleura: Progressive right lower lobe masslike densities with a somewhat branching pattern most likely branching endobronchial tumor with significant surrounding inflammation or interstitial spread of tumor. Radiation fibrosis is also likely contributory. The larger more proximal masslike lesion in the right lower lobe on image number 42 measures 32 x 19 mm. This previously measured 19.5 x 13.5 mm. The more inferior nodular density on image number 48 measures approximately 43 x 28 mm and previously measured 26 x 14.5 mm.  Increasing nodular density in the paramediastinal right  upper lobe. Could not exclude tumor. The nodularity is atypical for radiation change.  Stable underlying emphysematous changes.  No left lung pulmonary lesions.  CT ABDOMEN AND PELVIS FINDINGS  Two new vague right hepatic lobe liver lesions are worrisome for metastatic disease. The larger lesion measures 11 mm on image number 65. There is an enlarging subdiaphragmatic lymph node adjacent to the liver on image number 54 which measures 10 mm.  The spleen, adrenal glands and kidneys are unremarkable and stable. Stable small renal cysts.  The gallbladder is unremarkable. There is persistent common bile duct dilatation of uncertain etiology. No obvious distal common bile duct stone, ampullary lesion or pancreatic head mass. There is moderate pancreatic atrophy and  a slightly prominent main pancreatic duct. ERCP or MRCP may be helpful for further evaluation.  The stomach, duodenum, small bowel and colon are grossly normal. No inflammatory changes, mass lesions or obstructive findings.  Stable aortic atherosclerotic calcifications but no aneurysm or dissection. There are small scattered mesenteric and retroperitoneal lymph nodes but no mass or adenopathy.  The bladder, prostate gland and seminal vesicles are unremarkable. No pelvic mass or adenopathy. No free pelvic fluid collections. No inguinal mass or adenopathy.  No significant bony findings. No obvious osseous metastatic disease.  IMPRESSION: 1. Progression of multiple ill-defined soft tissue masses in the right lower lobe and extensive interstitial thickening. Findings suggest progressive tumor and radiation change versus interstitial spread of tumor. 2. Progressive nodularity in the right upper lobe worrisome for tumor. 3. No enlarging mediastinal or hilar lymph nodes. Stable sub carinal low-attenuation soft tissue density and/or fluid. 4. Enlarging subdiaphragmatic lymph node and findings suspicious for new hepatic metastatic disease. 5. Stable common bile duct  dilatation.   Electronically Signed   By: Marijo Sanes M.D.   On: 08/14/2014 13:53   Ct Angio Chest Pe W/cm &/or Wo Cm  09/01/2014   CLINICAL DATA:  Patient with increasing shortness of breath. Hemoptysis. History of lung cancer.  EXAM: CT ANGIOGRAPHY CHEST WITH CONTRAST  TECHNIQUE: Multidetector CT imaging of the chest was performed using the standard protocol during bolus administration of intravenous contrast. Multiplanar CT image reconstructions and MIPs were obtained to evaluate the vascular anatomy.  CONTRAST:  154m OMNIPAQUE IOHEXOL 350 MG/ML SOLN  COMPARISON:  Chest CT 08/14/2014  FINDINGS: Mediastinum/Nodes: Right anterior chest wall Port-A-Cath is present. Interval development of a 1.6 cm right hilar lymph node. Visualized thyroid is unremarkable. Heart is normal in size.  Extensive motion artifact particularly within the lower lobe limits evaluation. Nondiagnostic exam within the right lower lobe pulmonary arteries due to motion artifact. Within the above limitation no definite evidence for pulmonary embolism. The distal segmental and subsegmental pulmonary arteries are poorly evaluated.  Lungs/Pleura: Central airways are patent. When compared to prior evaluation there has been interval increase in size of masslike opacification within the right lower hemi thorax measuring 3.8 x 2.4 cm, previously 3.2 x 1.9 cm (image 68; series 7). There has been interval increase in surrounding ground-glass and consolidative pulmonary opacities. Additional right lower lobe mass measuring 3.3 x 3.7 cm (image 74; series 7) appears increased in size. Interval increase in size of nodularity within the paramediastinal right upper lobe with a reference nodule measuring 1.5 x 0.9 cm (image 57; series 7, previously 0.6 x 0.7 cm. Additional adjacent nodules have increased in size. Extensive centrilobular and paraseptal emphysematous change. Stable subpleural opacity within the left lower lobe (image 63; series 7). No definite  pleural effusion or pneumothorax.  Upper abdomen: Unchanged 10 mm supradiaphragmatic node (image 85; series 4). Interval increase in size of lateral left hepatic lobe lesion measuring 2.0 x 1.7 cm, previously 1.2 x 1.0 cm (image 96; series 4).  Musculoskeletal: No aggressive or acute appearing osseous lesions.  Review of the MIP images confirms the above findings.  IMPRESSION: Limited exam for pulmonary embolism given motion artifact. Nondiagnostic exam to evaluate the right lower lobe pulmonary arteries due to motion artifact. Within the above limitation no definite pulmonary embolism identified. Limited evaluation of the distal segmental and subsegmental pulmonary arteries.  When compared to recent prior evaluation there has been subsequent interval increase in size of large masslike areas of consolidation within the right lower  lobe concerning for disease progression. Interval increase in surrounding ground-glass nodularity concerning for the possibility of lymphangitic spread of tumor. Additionally a portion of this opacity may be secondary to hemorrhage.  Interval development of an enlarged right hilar lymph node.  Interval increase in right upper lobe nodularity compatible with disease progression.  Interval increase in size of hepatic lesion, compatible with disease progression.   Electronically Signed   By: Lovey Newcomer M.D.   On: 09/01/2014 16:05   Ct Abdomen Pelvis W Contrast  08/14/2014   CLINICAL DATA:  Restaging small cell lung cancer  EXAM: CT CHEST, ABDOMEN, AND PELVIS WITH CONTRAST  TECHNIQUE: Multidetector CT imaging of the chest, abdomen and pelvis was performed following the standard protocol during bolus administration of intravenous contrast.  CONTRAST:  73m OMNIPAQUE IOHEXOL 300 MG/ML SOLN, 1071mOMNIPAQUE IOHEXOL 300 MG/ML SOLN  COMPARISON:  06/15/2014  FINDINGS: CT CHEST FINDINGS  Chest wall: No chest wall mass, supraclavicular or axillary lymphadenopathy. The thyroid gland appears normal.  The bony thorax is intact. No destructive bone lesions or spinal canal compromise.  Mediastinum: The heart is normal in size. No pericardial effusion. Stable aortic and coronary artery calcifications. No focal aneurysm or dissection. The esophagus is grossly normal. Stable ill-defined low-attenuation tissue in the subcarinal space which could be treated tumor and/or fluid. I do not see a discrete mass or nodule. Small right hilar lymph nodes appears stable.  Lungs/pleura: Progressive right lower lobe masslike densities with a somewhat branching pattern most likely branching endobronchial tumor with significant surrounding inflammation or interstitial spread of tumor. Radiation fibrosis is also likely contributory. The larger more proximal masslike lesion in the right lower lobe on image number 42 measures 32 x 19 mm. This previously measured 19.5 x 13.5 mm. The more inferior nodular density on image number 48 measures approximately 43 x 28 mm and previously measured 26 x 14.5 mm.  Increasing nodular density in the paramediastinal right upper lobe. Could not exclude tumor. The nodularity is atypical for radiation change.  Stable underlying emphysematous changes.  No left lung pulmonary lesions.  CT ABDOMEN AND PELVIS FINDINGS  Two new vague right hepatic lobe liver lesions are worrisome for metastatic disease. The larger lesion measures 11 mm on image number 65. There is an enlarging subdiaphragmatic lymph node adjacent to the liver on image number 54 which measures 10 mm.  The spleen, adrenal glands and kidneys are unremarkable and stable. Stable small renal cysts.  The gallbladder is unremarkable. There is persistent common bile duct dilatation of uncertain etiology. No obvious distal common bile duct stone, ampullary lesion or pancreatic head mass. There is moderate pancreatic atrophy and a slightly prominent main pancreatic duct. ERCP or MRCP may be helpful for further evaluation.  The stomach, duodenum, small  bowel and colon are grossly normal. No inflammatory changes, mass lesions or obstructive findings.  Stable aortic atherosclerotic calcifications but no aneurysm or dissection. There are small scattered mesenteric and retroperitoneal lymph nodes but no mass or adenopathy.  The bladder, prostate gland and seminal vesicles are unremarkable. No pelvic mass or adenopathy. No free pelvic fluid collections. No inguinal mass or adenopathy.  No significant bony findings. No obvious osseous metastatic disease.  IMPRESSION: 1. Progression of multiple ill-defined soft tissue masses in the right lower lobe and extensive interstitial thickening. Findings suggest progressive tumor and radiation change versus interstitial spread of tumor. 2. Progressive nodularity in the right upper lobe worrisome for tumor. 3. No enlarging mediastinal or hilar lymph nodes.  Stable sub carinal low-attenuation soft tissue density and/or fluid. 4. Enlarging subdiaphragmatic lymph node and findings suspicious for new hepatic metastatic disease. 5. Stable common bile duct dilatation.   Electronically Signed   By: Marijo Sanes M.D.   On: 08/14/2014 13:53   Ct Extrem Up Entire Arm R W/cm  08/25/2014   CLINICAL DATA:  Right upper arm pain and swelling for 2 months, worsening. History of metastatic lung carcinoma on the right diagnosed March, 2015. Question metastatic disease to the right arm.  EXAM: CT OF THE UPPER RIGHT ARM WITH CONTRAST  TECHNIQUE: Contiguous axial CT images were taken through the right arm after the administration of IV contrast material. Coronal and sagittal reconstruction images are provided.  CONTRAST:  100 mL OMNIPAQUE IOHEXOL 300 MG/ML  SOLN  COMPARISON:  CT chest 08/14/2014.  FINDINGS: No soft tissue mass or fluid collection is identified. No lytic or sclerotic bony lesion is seen. There is no fracture. Major caliber vascular structures enhance normally. All imaged muscles and tendons appear intact. Mild appearing  acromioclavicular degenerative change is noted. There is partial visualization of dense airspace opacity in the right lower lobe as seen on the patient's recent chest CT. The lungs are emphysematous.  IMPRESSION: Negative for metastatic disease the right arm. No finding to explain the patient's symptoms is identified.  Mild acromioclavicular osteoarthritis.  Dense right lower lobe airspace opacity consistent with lung carcinoma is partially visualized but not grossly changed compared to the patient's recent chest CT.  Emphysema.   Electronically Signed   By: Inge Rise M.D.   On: 08/25/2014 09:38    ASSESSMENT AND PLAN: This is a very pleasant 64 years old white male with extensive stage small cell lung cancer status post palliative radiotherapy to the brain and chest as well as 6 cycles of systemic chemotherapy with carboplatin and etoposide completed in August 2015. The patient had been observation for the last 10 months. The recent CT scan of the chest, abdomen and pelvis showed evidence for disease progression with multiple ill-defined soft tissue masses in the right lower lobe and an enlarging subdiaphragmatic lymph nodes and a new suspicious hepatic metastasis. He is status post 1 cycle of  systemic chemotherapy with carboplatin for AUC of 5 on day 1 and etoposide 100 MG/M2 on days 1, 2 and 3 with Neulasta support on day 4. He tolerated this resumption of systemic chemotherapy relatively well with the exception of some mild nausea. His appetite is improved without the Medrol Dosepak and he has gained a little weight. He will continue with weekly labs as scheduled and return in 2 weeks prior to the start of cycle #2.  The patient was advised to call if he has any concerning symptoms in the interval. The patient voices understanding of current disease status and treatment options and is in agreement with the current care plan.  All questions were answered. The patient knows to call the clinic  with any problems, questions or concerns. We can certainly see the patient much sooner if necessary.  Carlton Adam, PA-C 09/08/2014

## 2014-09-08 NOTE — Patient Instructions (Signed)

## 2014-09-08 NOTE — Telephone Encounter (Signed)
-----   Message from Azzie Glatter, RN sent at 09/04/2014  2:01 PM EDT ----- Regarding: '1st time chemotherapy' Patient received Etoposide/Carbo today, per Dr. [name].  Patient has had treatment in the past.  Please call patient on Friday 09/08/14.  Tolerated treatment well.

## 2014-09-08 NOTE — Telephone Encounter (Signed)
per pof to sch pt appt-gave pt avs °

## 2014-09-11 ENCOUNTER — Telehealth: Payer: Self-pay | Admitting: Medical Oncology

## 2014-09-11 ENCOUNTER — Other Ambulatory Visit (HOSPITAL_BASED_OUTPATIENT_CLINIC_OR_DEPARTMENT_OTHER): Payer: BLUE CROSS/BLUE SHIELD

## 2014-09-11 ENCOUNTER — Ambulatory Visit: Payer: BLUE CROSS/BLUE SHIELD

## 2014-09-11 DIAGNOSIS — C3490 Malignant neoplasm of unspecified part of unspecified bronchus or lung: Secondary | ICD-10-CM

## 2014-09-11 DIAGNOSIS — M79601 Pain in right arm: Secondary | ICD-10-CM

## 2014-09-11 DIAGNOSIS — M545 Low back pain, unspecified: Secondary | ICD-10-CM

## 2014-09-11 DIAGNOSIS — R918 Other nonspecific abnormal finding of lung field: Secondary | ICD-10-CM

## 2014-09-11 DIAGNOSIS — K1230 Oral mucositis (ulcerative), unspecified: Secondary | ICD-10-CM

## 2014-09-11 DIAGNOSIS — C7B8 Other secondary neuroendocrine tumors: Secondary | ICD-10-CM | POA: Diagnosis not present

## 2014-09-11 DIAGNOSIS — C7A1 Malignant poorly differentiated neuroendocrine tumors: Secondary | ICD-10-CM

## 2014-09-11 DIAGNOSIS — Z95828 Presence of other vascular implants and grafts: Secondary | ICD-10-CM

## 2014-09-11 DIAGNOSIS — C3491 Malignant neoplasm of unspecified part of right bronchus or lung: Secondary | ICD-10-CM

## 2014-09-11 LAB — COMPREHENSIVE METABOLIC PANEL (CC13)
ALK PHOS: 96 U/L (ref 40–150)
ALT: 12 U/L (ref 0–55)
AST: 17 U/L (ref 5–34)
Albumin: 3.2 g/dL — ABNORMAL LOW (ref 3.5–5.0)
Anion Gap: 6 mEq/L (ref 3–11)
BUN: 11.7 mg/dL (ref 7.0–26.0)
CALCIUM: 9.1 mg/dL (ref 8.4–10.4)
CO2: 29 mEq/L (ref 22–29)
Chloride: 103 mEq/L (ref 98–109)
Creatinine: 0.8 mg/dL (ref 0.7–1.3)
EGFR: >90 mL/min/{1.73_m2} (ref >90)
GLUCOSE: 116 mg/dL (ref 70–140)
POTASSIUM: 4.2 meq/L (ref 3.5–5.1)
Sodium: 138 mEq/L (ref 136–145)
Total Bilirubin: 0.41 mg/dL (ref 0.20–1.20)
Total Protein: 6.2 g/dL — ABNORMAL LOW (ref 6.4–8.3)

## 2014-09-11 LAB — CBC WITH DIFFERENTIAL/PLATELET
BASO%: 0.3 % (ref 0.0–2.0)
BASOS ABS: 0 10*3/uL (ref 0.0–0.1)
EOS%: 0.9 % (ref 0.0–7.0)
Eosinophils Absolute: 0.1 10*3/uL (ref 0.0–0.5)
HEMATOCRIT: 31.9 % — AB (ref 38.4–49.9)
HGB: 11 g/dL — ABNORMAL LOW (ref 13.0–17.1)
LYMPH#: 0.6 10*3/uL — AB (ref 0.9–3.3)
LYMPH%: 7 % — ABNORMAL LOW (ref 14.0–49.0)
MCH: 32.6 pg (ref 27.2–33.4)
MCHC: 34.5 g/dL (ref 32.0–36.0)
MCV: 94.7 fL (ref 79.3–98.0)
MONO#: 0 10*3/uL — ABNORMAL LOW (ref 0.1–0.9)
MONO%: 0.3 % (ref 0.0–14.0)
NEUT%: 91.5 % — ABNORMAL HIGH (ref 39.0–75.0)
NEUTROS ABS: 7.9 10*3/uL — AB (ref 1.5–6.5)
NRBC: 0 % (ref 0–0)
Platelets: 74 10*3/uL — ABNORMAL LOW (ref 140–400)
RBC: 3.37 10*6/uL — AB (ref 4.20–5.82)
RDW: 11.6 % (ref 11.0–14.6)
WBC: 8.6 10*3/uL (ref 4.0–10.3)

## 2014-09-11 MED ORDER — SODIUM CHLORIDE 0.9 % IJ SOLN
10.0000 mL | INTRAMUSCULAR | Status: DC | PRN
  Administered 2014-09-11: 10 mL via INTRAVENOUS
  Filled 2014-09-11: qty 10

## 2014-09-11 MED ORDER — OXYCODONE HCL 5 MG PO TABS: 5.0000 mg | ORAL_TABLET | Freq: Four times a day (QID) | ORAL | Status: DC | PRN

## 2014-09-11 MED ORDER — HEPARIN SOD (PORK) LOCK FLUSH 100 UNIT/ML IV SOLN
500.0000 [IU] | Freq: Once | INTRAVENOUS | Status: AC
  Administered 2014-09-11: 500 [IU] via INTRAVENOUS
  Filled 2014-09-11: qty 5

## 2014-09-11 MED ORDER — MORPHINE SULFATE ER 30 MG PO TBCR: 30.0000 mg | EXTENDED_RELEASE_TABLET | Freq: Two times a day (BID) | ORAL | Status: DC

## 2014-09-11 NOTE — Telephone Encounter (Signed)
Narcotic refills ready for pick up -wife notified.

## 2014-09-11 NOTE — Patient Instructions (Signed)

## 2014-09-14 NOTE — Patient Instructions (Signed)
Continue weekly labs as scheduled Followup in 2 weeks prior to the start of your next scheduled cycle of chemotherapy 

## 2014-09-18 ENCOUNTER — Other Ambulatory Visit: Payer: BLUE CROSS/BLUE SHIELD

## 2014-09-18 ENCOUNTER — Other Ambulatory Visit (HOSPITAL_BASED_OUTPATIENT_CLINIC_OR_DEPARTMENT_OTHER): Payer: BLUE CROSS/BLUE SHIELD

## 2014-09-18 ENCOUNTER — Other Ambulatory Visit: Payer: Self-pay | Admitting: Medical Oncology

## 2014-09-18 ENCOUNTER — Ambulatory Visit (HOSPITAL_COMMUNITY)
Admission: RE | Admit: 2014-09-18 | Discharge: 2014-09-18 | Disposition: A | Discharge status: Home / Self Care | Payer: BLUE CROSS/BLUE SHIELD | Source: Ambulatory Visit | Attending: Internal Medicine | Admitting: Internal Medicine

## 2014-09-18 ENCOUNTER — Telehealth: Payer: Self-pay | Admitting: Medical Oncology

## 2014-09-18 ENCOUNTER — Ambulatory Visit (HOSPITAL_BASED_OUTPATIENT_CLINIC_OR_DEPARTMENT_OTHER): Payer: BLUE CROSS/BLUE SHIELD

## 2014-09-18 ENCOUNTER — Ambulatory Visit: Payer: BLUE CROSS/BLUE SHIELD

## 2014-09-18 VITALS — BP 119/67 | HR 65 | Temp 97.7°F | Resp 18

## 2014-09-18 DIAGNOSIS — C3491 Malignant neoplasm of unspecified part of right bronchus or lung: Secondary | ICD-10-CM

## 2014-09-18 DIAGNOSIS — C7A1 Malignant poorly differentiated neuroendocrine tumors: Secondary | ICD-10-CM | POA: Diagnosis not present

## 2014-09-18 DIAGNOSIS — C7B8 Other secondary neuroendocrine tumors: Secondary | ICD-10-CM | POA: Diagnosis not present

## 2014-09-18 DIAGNOSIS — D696 Thrombocytopenia, unspecified: Secondary | ICD-10-CM | POA: Diagnosis present

## 2014-09-18 DIAGNOSIS — C3490 Malignant neoplasm of unspecified part of unspecified bronchus or lung: Secondary | ICD-10-CM

## 2014-09-18 DIAGNOSIS — M79601 Pain in right arm: Secondary | ICD-10-CM

## 2014-09-18 LAB — COMPREHENSIVE METABOLIC PANEL (CC13)
ALBUMIN: 3.2 g/dL — AB (ref 3.5–5.0)
ALK PHOS: 102 U/L (ref 40–150)
ALT: 14 U/L (ref 0–55)
AST: 14 U/L (ref 5–34)
Anion Gap: 9 mEq/L (ref 3–11)
BUN: 8.1 mg/dL (ref 7.0–26.0)
CO2: 30 meq/L — AB (ref 22–29)
Calcium: 9.2 mg/dL (ref 8.4–10.4)
Chloride: 102 mEq/L (ref 98–109)
Creatinine: 0.8 mg/dL (ref 0.7–1.3)
GLUCOSE: 104 mg/dL (ref 70–140)
Potassium: 4 mEq/L (ref 3.5–5.1)
Sodium: 140 mEq/L (ref 136–145)
Total Bilirubin: 0.26 mg/dL (ref 0.20–1.20)
Total Protein: 6 g/dL — ABNORMAL LOW (ref 6.4–8.3)

## 2014-09-18 LAB — CBC WITH DIFFERENTIAL/PLATELET
BASO%: 0.7 % (ref 0.0–2.0)
Basophils Absolute: 0 10*3/uL (ref 0.0–0.1)
EOS%: 1.5 % (ref 0.0–7.0)
Eosinophils Absolute: 0 10*3/uL (ref 0.0–0.5)
HCT: 26.3 % — ABNORMAL LOW (ref 38.4–49.9)
HEMOGLOBIN: 9 g/dL — AB (ref 13.0–17.1)
LYMPH%: 22.6 % (ref 14.0–49.0)
MCH: 32.1 pg (ref 27.2–33.4)
MCHC: 34.3 g/dL (ref 32.0–36.0)
MCV: 93.6 fL (ref 79.3–98.0)
MONO#: 0.2 10*3/uL (ref 0.1–0.9)
MONO%: 11.4 % (ref 0.0–14.0)
NEUT#: 1.4 10*3/uL — ABNORMAL LOW (ref 1.5–6.5)
NEUT%: 63.8 % (ref 39.0–75.0)
PLATELETS: 8 10*3/uL — AB (ref 140–400)
RBC: 2.81 10*6/uL — ABNORMAL LOW (ref 4.20–5.82)
RDW: 11.5 % (ref 11.0–14.6)
WBC: 2.2 10*3/uL — ABNORMAL LOW (ref 4.0–10.3)
lymph#: 0.5 10*3/uL — ABNORMAL LOW (ref 0.9–3.3)

## 2014-09-18 MED ORDER — DIPHENHYDRAMINE HCL 25 MG PO CAPS
ORAL_CAPSULE | ORAL | Status: AC
  Filled 2014-09-18: qty 1

## 2014-09-18 MED ORDER — SODIUM CHLORIDE 0.9 % IV SOLN
250.0000 mL | Freq: Once | INTRAVENOUS | Status: AC
  Administered 2014-09-18: 250 mL via INTRAVENOUS

## 2014-09-18 MED ORDER — ACETAMINOPHEN 325 MG PO TABS
650.0000 mg | ORAL_TABLET | Freq: Once | ORAL | Status: AC
  Administered 2014-09-18: 650 mg via ORAL

## 2014-09-18 MED ORDER — HEPARIN SOD (PORK) LOCK FLUSH 100 UNIT/ML IV SOLN
500.0000 [IU] | Freq: Once | INTRAVENOUS | Status: AC
  Administered 2014-09-18: 500 [IU] via INTRAVENOUS
  Filled 2014-09-18: qty 5

## 2014-09-18 MED ORDER — DIPHENHYDRAMINE HCL 25 MG PO CAPS
25.0000 mg | ORAL_CAPSULE | Freq: Once | ORAL | Status: AC
  Administered 2014-09-18: 25 mg via ORAL

## 2014-09-18 MED ORDER — HEPARIN SOD (PORK) LOCK FLUSH 100 UNIT/ML IV SOLN
500.0000 [IU] | Freq: Every day | INTRAVENOUS | Status: AC | PRN
  Administered 2014-09-18: 500 [IU]
  Filled 2014-09-18: qty 5

## 2014-09-18 MED ORDER — SODIUM CHLORIDE 0.9 % IJ SOLN
10.0000 mL | INTRAMUSCULAR | Status: DC | PRN
  Administered 2014-09-18: 10 mL via INTRAVENOUS
  Filled 2014-09-18: qty 10

## 2014-09-18 MED ORDER — SODIUM CHLORIDE 0.9 % IJ SOLN
10.0000 mL | INTRAMUSCULAR | Status: AC | PRN
  Administered 2014-09-18: 10 mL
  Filled 2014-09-18: qty 10

## 2014-09-18 MED ORDER — ACETAMINOPHEN 325 MG PO TABS
ORAL_TABLET | ORAL | Status: AC
  Filled 2014-09-18: qty 2

## 2014-09-18 NOTE — Patient Instructions (Signed)

## 2014-09-18 NOTE — Telephone Encounter (Signed)
har done 

## 2014-09-18 NOTE — Patient Instructions (Signed)
Platelet Transfusion Information °This is information about transfusions of platelets. Platelets are tiny cells made by the bone marrow and found in the blood. When a blood vessel is damaged, platelets rush to the damaged area to help form a clot. This begins the healing process. When platelets get very low, your blood may have trouble clotting. This may be from: °· Illness. °· Blood disorder. °· Chemotherapy to treat cancer. °Often, lower platelet counts do not cause problems.  °Platelets usually last for 7 to 10 days. If they are not used in an injury, they are broken down by the liver or spleen. °Symptoms of low platelet count include: °· Nosebleeds. °· Bleeding gums. °· Heavy periods. °· Bruising and tiny blood spots in the skin. °¨ Pinpoint spots of bleeding (petechiae). °¨ Larger bruises (purpura). °· Bleeding can be more serious if it happens in the brain or bowel. °Platelet transfusions are often used to keep the platelet count at an acceptable level. Serious bleeding due to low platelets is uncommon. °RISKS AND COMPLICATIONS °Severe side effects from platelet transfusions are uncommon. Minor reactions may include: °· Itching. °· Rashes. °· High temperature and shivering. °Medications are available to stop transfusion reactions. Let your health care provider know if you develop any of the above problems.  °If you are having platelet transfusions frequently, they may get less effective. This is called becoming refractory to platelets. It is uncommon. This can happen from non-immune causes and immune causes. Non-immune causes include: °· High temperatures. °· Some medications. °· An enlarged spleen. °Immune causes happen when your body discovers the platelets are not your own and begins making antibodies against them. The antibodies kill the platelets quickly. Even with platelet transfusions, you may still notice problems with bleeding or bruising. Let your health care providers know about this. Other things  can be done to help if this happens.  °BEFORE THE PROCEDURE  °· Your health care provider will check your platelet count regularly. °· If the platelet count is too low, it may be necessary to have a platelet transfusion. °· This is more important before certain procedures with a risk of bleeding, such as a spinal tap. °· Platelet transfusion reduces the risk of bleeding during or after the procedure. °· Except in emergencies, giving a transfusion requires a written consent. °Before blood is taken from a donor, a complete history is taken to make sure the person has no history of previous diseases, nor engages in risky social behavior. Examples of this are intravenous drug use or sexual activity with multiple partners. This could lead to infected blood or blood products being used. This history is taken in spite of the extensive testing to make sure the blood is safe. All blood products transfused are tested to make sure it is a match for the person getting the blood. It is also checked for infections. Blood is the safest it has ever been. The risk of getting an infection is very low. °PROCEDURE °· The platelets are stored in small plastic bags that are kept at a low temperature. °· Each bag is called a unit and sometimes two units are given. They are given through an intravenous line by drip infusion over about one-half hour. °· Usually blood is collected from multiple people to get enough to transfuse. °· Sometimes, the platelets are collected from a single person. This is done using a special machine that separates the platelets from the blood. The machine is called an apheresis machine. Platelets collected in this   way are called apheresed platelets. Apheresed platelets reduce the risk of becoming sensitive to the platelets. This lowers the chances of having a transfusion reaction. °· As it only takes a short time to give the platelets, this treatment can be given in an outpatient department. Platelets can also be  given before or after other treatments. °SEEK IMMEDIATE MEDICAL CARE IF: °You have any of the following symptoms over the next 12 hours or several days: °· Shaking chills. °· Fever with a temperature greater than 102°F (38.9°C) develops. °· Back pain or muscle pain. °· People around you feel you are not acting correctly, or you are confused. °· Blood in the urine or bowel movements, or bleeding from any place in your body. °· Shortness of breath, or difficulty breathing. °· Dizziness. °· Fainting. °· You break out in a rash or develop hives. °· Decrease in the amount of urine you are putting out, or the urine turns a dark color or changes to pink, red, or brown. °· A severe headache or stiff neck. °· Bruising more easily. °Document Released: 12/08/2006 Document Revised: 06/27/2013 Document Reviewed: 12/08/2006 °ExitCare® Patient Information ©2015 [employer]. This information is not intended to replace advice given to you by your health care provider. Make sure you discuss any questions you have with your health care provider. ° °

## 2014-09-18 NOTE — Progress Notes (Signed)
HAR done

## 2014-09-19 LAB — PREPARE PLATELET PHERESIS: Unit division: 0

## 2014-09-19 NOTE — Addendum Note (Signed)
Addended by: Adalberto Cole on: 09/19/2014 05:48 PM   Modules accepted: Orders

## 2014-09-25 ENCOUNTER — Telehealth: Payer: Self-pay | Admitting: *Deleted

## 2014-09-25 ENCOUNTER — Telehealth: Payer: Self-pay | Admitting: Internal Medicine

## 2014-09-25 ENCOUNTER — Encounter: Payer: Self-pay | Admitting: Internal Medicine

## 2014-09-25 ENCOUNTER — Other Ambulatory Visit (HOSPITAL_BASED_OUTPATIENT_CLINIC_OR_DEPARTMENT_OTHER): Payer: BLUE CROSS/BLUE SHIELD

## 2014-09-25 ENCOUNTER — Ambulatory Visit (HOSPITAL_BASED_OUTPATIENT_CLINIC_OR_DEPARTMENT_OTHER): Payer: BLUE CROSS/BLUE SHIELD | Admitting: Internal Medicine

## 2014-09-25 ENCOUNTER — Ambulatory Visit (HOSPITAL_BASED_OUTPATIENT_CLINIC_OR_DEPARTMENT_OTHER): Payer: BLUE CROSS/BLUE SHIELD

## 2014-09-25 VITALS — BP 128/91 | HR 103 | Temp 98.4°F | Resp 17 | Ht 67.0 in | Wt 109.7 lb

## 2014-09-25 DIAGNOSIS — C7B8 Other secondary neuroendocrine tumors: Secondary | ICD-10-CM | POA: Diagnosis not present

## 2014-09-25 DIAGNOSIS — M79601 Pain in right arm: Secondary | ICD-10-CM | POA: Diagnosis not present

## 2014-09-25 DIAGNOSIS — C3491 Malignant neoplasm of unspecified part of right bronchus or lung: Secondary | ICD-10-CM

## 2014-09-25 DIAGNOSIS — C3490 Malignant neoplasm of unspecified part of unspecified bronchus or lung: Secondary | ICD-10-CM

## 2014-09-25 DIAGNOSIS — C7A1 Malignant poorly differentiated neuroendocrine tumors: Secondary | ICD-10-CM

## 2014-09-25 DIAGNOSIS — R0602 Shortness of breath: Secondary | ICD-10-CM | POA: Diagnosis not present

## 2014-09-25 DIAGNOSIS — Z5111 Encounter for antineoplastic chemotherapy: Secondary | ICD-10-CM | POA: Diagnosis not present

## 2014-09-25 DIAGNOSIS — E43 Unspecified severe protein-calorie malnutrition: Secondary | ICD-10-CM

## 2014-09-25 DIAGNOSIS — R630 Anorexia: Secondary | ICD-10-CM

## 2014-09-25 LAB — COMPREHENSIVE METABOLIC PANEL (CC13)
ALBUMIN: 3.4 g/dL — AB (ref 3.5–5.0)
ALK PHOS: 124 U/L (ref 40–150)
ALT: 16 U/L (ref 0–55)
AST: 18 U/L (ref 5–34)
Anion Gap: 8 mEq/L (ref 3–11)
BUN: 9.1 mg/dL (ref 7.0–26.0)
CALCIUM: 9.6 mg/dL (ref 8.4–10.4)
CHLORIDE: 101 meq/L (ref 98–109)
CO2: 30 meq/L — AB (ref 22–29)
Creatinine: 0.9 mg/dL (ref 0.7–1.3)
Glucose: 122 mg/dl (ref 70–140)
POTASSIUM: 3.8 meq/L (ref 3.5–5.1)
Sodium: 139 mEq/L (ref 136–145)
TOTAL PROTEIN: 6.9 g/dL (ref 6.4–8.3)
Total Bilirubin: <0.2 mg/dL (ref 0.20–1.20)

## 2014-09-25 LAB — CBC WITH DIFFERENTIAL/PLATELET
BASO%: 0.1 % (ref 0.0–2.0)
Basophils Absolute: 0 10*3/uL (ref 0.0–0.1)
EOS ABS: 0 10*3/uL (ref 0.0–0.5)
EOS%: 0.1 % (ref 0.0–7.0)
HEMATOCRIT: 27.8 % — AB (ref 38.4–49.9)
HEMOGLOBIN: 9.6 g/dL — AB (ref 13.0–17.1)
LYMPH#: 0.8 10*3/uL — AB (ref 0.9–3.3)
LYMPH%: 8.1 % — ABNORMAL LOW (ref 14.0–49.0)
MCH: 32 pg (ref 27.2–33.4)
MCHC: 34.5 g/dL (ref 32.0–36.0)
MCV: 92.7 fL (ref 79.3–98.0)
MONO#: 1.3 10*3/uL — ABNORMAL HIGH (ref 0.1–0.9)
MONO%: 13.8 % (ref 0.0–14.0)
NEUT#: 7.4 10*3/uL — ABNORMAL HIGH (ref 1.5–6.5)
NEUT%: 77.9 % — AB (ref 39.0–75.0)
NRBC: 1 % — AB (ref 0–0)
PLATELETS: 159 10*3/uL (ref 140–400)
RBC: 3 10*6/uL — AB (ref 4.20–5.82)
RDW: 11.4 % (ref 11.0–14.6)
WBC: 9.4 10*3/uL (ref 4.0–10.3)

## 2014-09-25 MED ORDER — CARBOPLATIN CHEMO INTRADERMAL TEST DOSE 100MCG/0.02ML
100.0000 ug | Freq: Once | INTRADERMAL | Status: AC
  Administered 2014-09-25: 100 ug via INTRADERMAL
  Filled 2014-09-25: qty 0.01

## 2014-09-25 MED ORDER — SODIUM CHLORIDE 0.9 % IV SOLN
460.0000 mg | Freq: Once | INTRAVENOUS | Status: AC
  Administered 2014-09-25: 460 mg via INTRAVENOUS
  Filled 2014-09-25: qty 46

## 2014-09-25 MED ORDER — SODIUM CHLORIDE 0.9 % IV SOLN
Freq: Once | INTRAVENOUS | Status: AC
  Administered 2014-09-25: 11:00:00 via INTRAVENOUS
  Filled 2014-09-25: qty 8

## 2014-09-25 MED ORDER — SODIUM CHLORIDE 0.9 % IJ SOLN
10.0000 mL | INTRAMUSCULAR | Status: DC | PRN
  Administered 2014-09-25: 10 mL
  Filled 2014-09-25: qty 10

## 2014-09-25 MED ORDER — HEPARIN SOD (PORK) LOCK FLUSH 100 UNIT/ML IV SOLN
500.0000 [IU] | Freq: Once | INTRAVENOUS | Status: AC | PRN
  Administered 2014-09-25: 500 [IU]
  Filled 2014-09-25: qty 5

## 2014-09-25 MED ORDER — ETOPOSIDE CHEMO INJECTION 1 GM/50ML
100.0000 mg/m2 | Freq: Once | INTRAVENOUS | Status: AC
  Administered 2014-09-25: 150 mg via INTRAVENOUS
  Filled 2014-09-25: qty 7.5

## 2014-09-25 MED ORDER — SODIUM CHLORIDE 0.9 % IV SOLN
Freq: Once | INTRAVENOUS | Status: AC
  Administered 2014-09-25: 10:00:00 via INTRAVENOUS

## 2014-09-25 NOTE — Telephone Encounter (Signed)
Appointments verified with patients daughter abut per daughter he needed to see dr [name] in three [facility] sent a message to [facility] to adjust 8/22

## 2014-09-25 NOTE — Addendum Note (Signed)
Addended by: Ardeen Garland on: 09/25/2014 09:07 AM   Modules accepted: Medications

## 2014-09-25 NOTE — Telephone Encounter (Signed)
I have adjusted 8/22

## 2014-09-25 NOTE — Progress Notes (Signed)
[place] [phone]   Fax:(336) [place], [place] [address] 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  PRIOR THERAPY: 1. First-line chemotherapy with 5 cycles of carboplatin (cisplatin for the first cycle) and etoposide from 05/18/2013 to 10/15/2013.  2. WB XRT and palliative chest radiation given on 05/03/13 - 05/18/13 per Dr. Pablo Ledger.  CURRENT THERAPY: Systemic chemotherapy with carboplatin for AUC of 5 on day 1 and etoposide 100 MG/M2 on days 1, 2 and 3 with Neulasta support on day 4. Status post one cycle.   INTERVAL HISTORY: Ricky Villanueva 64 y.o. male returns to the clinic today for follow-up visit accompanied by his wife, son and daughter. The patient tolerated the first cycle of his systemic chemotherapy fairly well with no significant adverse effects except for mild nausea. He also continues to have right arm pain and inability to raise his arm above the shoulder. CT of the right arm showed no significant abnormalities. He denied having any significant chest pain but has mild shortness of breath. He denied having any vomiting. He has no fever or chills. He is here today to start cycle #2 of his systemic chemotherapy.   MEDICAL HISTORY: Past Medical History  Diagnosis Date  . Hyperlipidemia   . Hypertension   . GERD (gastroesophageal reflux disease)   . Respiratory failure with hypoxia 04/21/2013    secondary to pneumonia/notes 04/21/2013  . Pulmonary embolism     "got one in there now" (04/21/2013)  . COPD (chronic obstructive pulmonary disease)   . Pneumonia 2/?01/2014    "wouldn't get better; hospitalized 04/21/2013)  . Arthritis     "minor in my right hand" (04/21/2013)  . History of radiation therapy 05/03/13-05/18/13    chest & whole brain  . Cancer     lung ca dx'd 03/2013  . Colon polyps    hyperplastic  . Right arm pain 08/21/2014    ALLERGIES:  is allergic to decadron and augmentin.  MEDICATIONS:  Current Outpatient Prescriptions  Medication Sig Dispense Refill  . albuterol (PROVENTIL HFA;VENTOLIN HFA) 108 (90 BASE) MCG/ACT inhaler Inhale 2 puffs into the lungs every 6 (six) hours as needed for wheezing or shortness of breath. 1 Inhaler 2  . chlorpheniramine-HYDROcodone (TUSSIONEX PENNKINETIC ER) 10-8 MG/5ML SUER Take 5 mLs by mouth every 12 (twelve) hours as needed for cough. 140 mL 0  . dextromethorphan 15 MG/5ML syrup Take 10 mLs by mouth as needed for cough.    . docusate sodium (COLACE) 100 MG capsule Take 300 mg by mouth 2 (two) times daily as needed for mild constipation.     . Iron-Vitamins (GERITOL PO) Take 1 tablet by mouth daily.    [name] lidocaine-prilocaine (EMLA) cream Apply to port site one hour before treatment and cover with plastic wrap. 1 each 2  . LORazepam (ATIVAN) 0.5 MG tablet Take 1 tablet (0.5 mg total) by mouth 2 (two) times daily. for anxiety (Patient taking differently: Take 0.5 mg by mouth 2 (two) times daily. ) 60 tablet 3  . magnesium hydroxide (MILK OF MAGNESIA) 400 MG/5ML suspension Take 5 mLs by mouth daily as needed for mild constipation.    . mirtazapine (REMERON) 30 MG tablet Take 1 tablet (30 mg total) by mouth at bedtime. 90 tablet 1  . morphine (MS CONTIN) 30 MG 12 hr tablet Take 1 tablet (  30 mg total) by mouth every 12 (twelve) hours. 60 tablet 0  . omeprazole (PRILOSEC) 40 MG capsule Take 40 mg by mouth daily.     . ondansetron (ZOFRAN) 8 MG tablet Take 1 tablet (8 mg total) by mouth every 8 (eight) hours as needed for nausea or vomiting (nausea). 20 tablet 3  . oxyCODONE (OXY IR/ROXICODONE) 5 MG immediate release tablet Take 1 tablet (5 mg total) by mouth every 6 (six) hours as needed for severe pain. 120 tablet 0  . polyethylene glycol powder (MIRALAX) powder Take 17 g by mouth daily. (Patient taking differently: Take 17 g by mouth daily as  needed for mild constipation or moderate constipation. ) 255 g 0  . prochlorperazine (COMPAZINE) 10 MG tablet Take 1 tablet (10 mg total) by mouth every 6 (six) hours as needed for nausea or vomiting (nausea). 30 tablet 3  . rivaroxaban (XARELTO) 20 MG TABS tablet Take 1 tablet (20 mg total) by mouth daily with supper. 30 tablet 1  . tiotropium (SPIRIVA HANDIHALER) 18 MCG inhalation capsule Place 1 capsule (18 mcg total) into inhaler and inhale daily. 90 capsule 1  . vitamin B-12 (CYANOCOBALAMIN) 100 MCG tablet Take 100 mcg by mouth daily.     No current facility-administered medications for this visit.   Facility-Administered Medications Ordered in Other Visits  Medication Dose Route Frequency Provider Last Rate Last Dose  . 0.9 %  sodium chloride infusion  1,000 mL Intravenous Once Concha Norway, MD      . 0.9 %  sodium chloride infusion   Intravenous Once Concha Norway, MD        SURGICAL HISTORY:  Past Surgical History  Procedure Laterality Date  . Inguinal hernia repair Right ~ 2005  . Finger surgery Left ~ 1989    "crushed end of my middle finger off"   . Video bronchoscopy Bilateral 04/26/2013    Procedure: VIDEO BRONCHOSCOPY WITH FLUORO;  Surgeon: Wilhelmina Mcardle, MD;  Location: [facility] ENDOSCOPY;  Service: Cardiopulmonary;  Laterality: Bilateral;    REVIEW OF SYSTEMS:  Constitutional: positive for fatigue Eyes: negative Ears, nose, mouth, throat, and face: negative Respiratory: positive for cough and dyspnea on exertion Cardiovascular: negative Gastrointestinal: negative Genitourinary:negative Integument/breast: negative Hematologic/lymphatic: negative Musculoskeletal:negative Neurological: negative Behavioral/Psych: negative Endocrine: negative Allergic/Immunologic: negative   PHYSICAL EXAMINATION: General appearance: alert, cooperative, fatigued and no distress Head: Normocephalic, without obvious abnormality, atraumatic Neck: no adenopathy, no JVD, supple, symmetrical,  trachea midline and thyroid not enlarged, symmetric, no tenderness/mass/nodules Lymph nodes: Cervical, supraclavicular, and axillary nodes normal. Resp: clear to auscultation bilaterally Back: symmetric, no curvature. ROM normal. No CVA tenderness. Cardio: regular rate and rhythm, S1, S2 normal, no murmur, click, rub or gallop GI: soft, non-tender; bowel sounds normal; no masses,  no organomegaly Extremities: extremities normal, atraumatic, no cyanosis or edema Neurologic: Alert and oriented X 3, normal strength and tone. Normal symmetric reflexes. Normal coordination and gait  ECOG PERFORMANCE STATUS: 1 - Symptomatic but completely ambulatory  Blood pressure 128/91, pulse 103, temperature 98.4 F (36.9 C), temperature source Oral, resp. rate 17, height '5\' 7"'$  (1.702 m), weight 109 lb 11.2 oz (49.76 kg), SpO2 92 %.  LABORATORY DATA: Lab Results  Component Value Date   WBC 2.2* 09/18/2014   HGB 9.0* 09/18/2014   HCT 26.3* 09/18/2014   MCV 93.6 09/18/2014   PLT 8* 09/18/2014      Chemistry      Component Value Date/Time   NA 140 09/18/2014 0935   NA 139  09/01/2014 1313   K 4.0 09/18/2014 0935   K 4.5 09/01/2014 1313   CL 103 09/01/2014 1313   CO2 30* 09/18/2014 0935   CO2 27 09/01/2014 1313   BUN 8.1 09/18/2014 0935   BUN 11 09/01/2014 1313   CREATININE 0.8 09/18/2014 0935   CREATININE 0.92 09/01/2014 1313      Component Value Date/Time   CALCIUM 9.2 09/18/2014 0935   CALCIUM 9.5 09/01/2014 1313   ALKPHOS 102 09/18/2014 0935   ALKPHOS 67 09/01/2014 1313   AST 14 09/18/2014 0935   AST 20 09/01/2014 1313   ALT 14 09/18/2014 0935   ALT 14* 09/01/2014 1313   BILITOT 0.26 09/18/2014 0935   BILITOT 0.9 09/01/2014 1313       RADIOGRAPHIC STUDIES: Dg Chest 2 View  09/01/2014   CLINICAL DATA:  Hemoptysis. Fever and cough for 2 days. Right small cell lung carcinoma.  EXAM: CHEST  2 VIEW  COMPARISON:  Recent chest CT on 08/14/2014  FINDINGS: Right lower lobe airspace disease  shows no significant interval change. Left lung is clear . Pulmonary emphysema again noted. No evidence of pleural effusion. Heart size is normal. Right-sided Port-A-Cath remains in appropriate position.  IMPRESSION: Right lower lobe airspace disease, without significant change compared to recent chest CT.  Emphysema.   Electronically Signed   By: Earle Gell M.D.   On: 09/01/2014 14:28   Ct Angio Chest Pe W/cm &/or Wo Cm  09/01/2014   CLINICAL DATA:  Patient with increasing shortness of breath. Hemoptysis. History of lung cancer.  EXAM: CT ANGIOGRAPHY CHEST WITH CONTRAST  TECHNIQUE: Multidetector CT imaging of the chest was performed using the standard protocol during bolus administration of intravenous contrast. Multiplanar CT image reconstructions and MIPs were obtained to evaluate the vascular anatomy.  CONTRAST:  126m OMNIPAQUE IOHEXOL 350 MG/ML SOLN  COMPARISON:  Chest CT 08/14/2014  FINDINGS: Mediastinum/Nodes: Right anterior chest wall Port-A-Cath is present. Interval development of a 1.6 cm right hilar lymph node. Visualized thyroid is unremarkable. Heart is normal in size.  Extensive motion artifact particularly within the lower lobe limits evaluation. Nondiagnostic exam within the right lower lobe pulmonary arteries due to motion artifact. Within the above limitation no definite evidence for pulmonary embolism. The distal segmental and subsegmental pulmonary arteries are poorly evaluated.  Lungs/Pleura: Central airways are patent. When compared to prior evaluation there has been interval increase in size of masslike opacification within the right lower hemi thorax measuring 3.8 x 2.4 cm, previously 3.2 x 1.9 cm (image 68; series 7). There has been interval increase in surrounding ground-glass and consolidative pulmonary opacities. Additional right lower lobe mass measuring 3.3 x 3.7 cm (image 74; series 7) appears increased in size. Interval increase in size of nodularity within the paramediastinal  right upper lobe with a reference nodule measuring 1.5 x 0.9 cm (image 57; series 7, previously 0.6 x 0.7 cm. Additional adjacent nodules have increased in size. Extensive centrilobular and paraseptal emphysematous change. Stable subpleural opacity within the left lower lobe (image 63; series 7). No definite pleural effusion or pneumothorax.  Upper abdomen: Unchanged 10 mm supradiaphragmatic node (image 85; series 4). Interval increase in size of lateral left hepatic lobe lesion measuring 2.0 x 1.7 cm, previously 1.2 x 1.0 cm (image 96; series 4).  Musculoskeletal: No aggressive or acute appearing osseous lesions.  Review of the MIP images confirms the above findings.  IMPRESSION: Limited exam for pulmonary embolism given motion artifact. Nondiagnostic exam to evaluate the right lower  lobe pulmonary arteries due to motion artifact. Within the above limitation no definite pulmonary embolism identified. Limited evaluation of the distal segmental and subsegmental pulmonary arteries.  When compared to recent prior evaluation there has been subsequent interval increase in size of large masslike areas of consolidation within the right lower lobe concerning for disease progression. Interval increase in surrounding ground-glass nodularity concerning for the possibility of lymphangitic spread of tumor. Additionally a portion of this opacity may be secondary to hemorrhage.  Interval development of an enlarged right hilar lymph node.  Interval increase in right upper lobe nodularity compatible with disease progression.  Interval increase in size of hepatic lesion, compatible with disease progression.   Electronically Signed   By: Lovey Newcomer M.D.   On: 09/01/2014 16:05    ASSESSMENT AND PLAN: This is a very pleasant 64 years old white male with extensive stage small cell lung cancer status post palliative radiotherapy to the brain and chest as well as 6 cycles of systemic chemotherapy with carboplatin and etoposide  completed in August 2015. The patient has been observation for the last 10 months. The recent CT scan of the chest, abdomen and pelvis showed evidence for disease progression with multiple ill-defined soft tissue masses in the right lower lobe and an enlarging subdiaphragmatic lymph nodes and a new suspicious hepatic metastasis. He is currently on systemic chemotherapy with carboplatin and etoposide, status post 1 cycle and tolerated the first dose of his treatment fairly well. His lab work is still pending and if appropriate will proceed with cycle #2 today as scheduled. The patient would come back for follow-up visit in 3 weeks for reevaluation before starting cycle #3. He will have repeat CT scan of the chest, abdomen and pelvis after cycle #3. The patient was advised to call if he has any concerning symptoms in the interval. The patient voices understanding of current disease status and treatment options and is in agreement with the current care plan.  All questions were answered. The patient knows to call the clinic with any problems, questions or concerns. We can certainly see the patient much sooner if necessary.  Disclaimer: This note was dictated with voice recognition software. Similar sounding words can inadvertently be transcribed and may not be corrected upon review.

## 2014-09-25 NOTE — Patient Instructions (Signed)
[facility] Discharge Instructions for Patients Receiving Chemotherapy  Today you received the following chemotherapy agents: Carboplatin and Etoposide   To help prevent nausea and vomiting after your treatment, we encourage you to take your nausea medication as prescribed.    If you develop nausea and vomiting that is not controlled by your nausea medication, call the clinic.   BELOW ARE SYMPTOMS THAT SHOULD BE REPORTED IMMEDIATELY:  *FEVER GREATER THAN 100.5 F  *CHILLS WITH OR WITHOUT FEVER  NAUSEA AND VOMITING THAT IS NOT CONTROLLED WITH YOUR NAUSEA MEDICATION  *UNUSUAL SHORTNESS OF BREATH  *UNUSUAL BRUISING OR BLEEDING  TENDERNESS IN MOUTH AND THROAT WITH OR WITHOUT PRESENCE OF ULCERS  *URINARY PROBLEMS  *BOWEL PROBLEMS  UNUSUAL RASH Items with * indicate a potential emergency and should be followed up as soon as possible.  Feel free to call the clinic you have any questions or concerns. The clinic phone number is [phone].  Please show the [place] at check-in to the Emergency Department and triage nurse.

## 2014-09-26 ENCOUNTER — Ambulatory Visit (HOSPITAL_BASED_OUTPATIENT_CLINIC_OR_DEPARTMENT_OTHER): Payer: BLUE CROSS/BLUE SHIELD

## 2014-09-26 VITALS — BP 116/71 | HR 75 | Temp 97.7°F | Resp 17

## 2014-09-26 DIAGNOSIS — C7B8 Other secondary neuroendocrine tumors: Secondary | ICD-10-CM | POA: Diagnosis not present

## 2014-09-26 DIAGNOSIS — C3491 Malignant neoplasm of unspecified part of right bronchus or lung: Secondary | ICD-10-CM

## 2014-09-26 DIAGNOSIS — Z5111 Encounter for antineoplastic chemotherapy: Secondary | ICD-10-CM

## 2014-09-26 DIAGNOSIS — C7A1 Malignant poorly differentiated neuroendocrine tumors: Secondary | ICD-10-CM | POA: Diagnosis not present

## 2014-09-26 MED ORDER — PROCHLORPERAZINE MALEATE 10 MG PO TABS
ORAL_TABLET | ORAL | Status: AC
  Filled 2014-09-26: qty 1

## 2014-09-26 MED ORDER — SODIUM CHLORIDE 0.9 % IJ SOLN
10.0000 mL | INTRAMUSCULAR | Status: DC | PRN
  Administered 2014-09-26: 10 mL
  Filled 2014-09-26: qty 10

## 2014-09-26 MED ORDER — SODIUM CHLORIDE 0.9 % IV SOLN
Freq: Once | INTRAVENOUS | Status: AC
  Administered 2014-09-26: 09:00:00 via INTRAVENOUS

## 2014-09-26 MED ORDER — PROCHLORPERAZINE MALEATE 10 MG PO TABS
10.0000 mg | ORAL_TABLET | Freq: Once | ORAL | Status: AC
  Administered 2014-09-26: 10 mg via ORAL

## 2014-09-26 MED ORDER — HEPARIN SOD (PORK) LOCK FLUSH 100 UNIT/ML IV SOLN
500.0000 [IU] | Freq: Once | INTRAVENOUS | Status: AC | PRN
  Administered 2014-09-26: 500 [IU]
  Filled 2014-09-26: qty 5

## 2014-09-26 MED ORDER — SODIUM CHLORIDE 0.9 % IV SOLN
100.0000 mg/m2 | Freq: Once | INTRAVENOUS | Status: AC
  Administered 2014-09-26: 150 mg via INTRAVENOUS
  Filled 2014-09-26: qty 7.5

## 2014-09-26 NOTE — Patient Instructions (Signed)
[facility] Discharge Instructions for Patients Receiving Chemotherapy  Today you received the following chemotherapy agents: Etoposide   To help prevent nausea and vomiting after your treatment, we encourage you to take your nausea medication as directed.    If you develop nausea and vomiting that is not controlled by your nausea medication, call the clinic.   BELOW ARE SYMPTOMS THAT SHOULD BE REPORTED IMMEDIATELY:  *FEVER GREATER THAN 100.5 F  *CHILLS WITH OR WITHOUT FEVER  NAUSEA AND VOMITING THAT IS NOT CONTROLLED WITH YOUR NAUSEA MEDICATION  *UNUSUAL SHORTNESS OF BREATH  *UNUSUAL BRUISING OR BLEEDING  TENDERNESS IN MOUTH AND THROAT WITH OR WITHOUT PRESENCE OF ULCERS  *URINARY PROBLEMS  *BOWEL PROBLEMS  UNUSUAL RASH Items with * indicate a potential emergency and should be followed up as soon as possible.  Feel free to call the clinic you have any questions or concerns. The clinic phone number is [phone].  Please show the CHEMO ALERT CARD at check-in to the Emergency Department and triage nurse.   

## 2014-09-27 ENCOUNTER — Ambulatory Visit (HOSPITAL_BASED_OUTPATIENT_CLINIC_OR_DEPARTMENT_OTHER): Payer: BLUE CROSS/BLUE SHIELD

## 2014-09-27 VITALS — BP 124/72 | HR 80 | Temp 97.6°F

## 2014-09-27 DIAGNOSIS — C7A1 Malignant poorly differentiated neuroendocrine tumors: Secondary | ICD-10-CM | POA: Diagnosis not present

## 2014-09-27 DIAGNOSIS — C7B8 Other secondary neuroendocrine tumors: Secondary | ICD-10-CM

## 2014-09-27 DIAGNOSIS — C3491 Malignant neoplasm of unspecified part of right bronchus or lung: Secondary | ICD-10-CM

## 2014-09-27 DIAGNOSIS — Z5111 Encounter for antineoplastic chemotherapy: Secondary | ICD-10-CM | POA: Diagnosis not present

## 2014-09-27 MED ORDER — HEPARIN SOD (PORK) LOCK FLUSH 100 UNIT/ML IV SOLN
500.0000 [IU] | Freq: Once | INTRAVENOUS | Status: AC | PRN
  Administered 2014-09-27: 500 [IU]
  Filled 2014-09-27: qty 5

## 2014-09-27 MED ORDER — PROCHLORPERAZINE MALEATE 10 MG PO TABS
ORAL_TABLET | ORAL | Status: AC
  Filled 2014-09-27: qty 1

## 2014-09-27 MED ORDER — SODIUM CHLORIDE 0.9 % IJ SOLN
10.0000 mL | INTRAMUSCULAR | Status: DC | PRN
  Administered 2014-09-27: 10 mL
  Filled 2014-09-27: qty 10

## 2014-09-27 MED ORDER — SODIUM CHLORIDE 0.9 % IV SOLN
100.0000 mg/m2 | Freq: Once | INTRAVENOUS | Status: AC
  Administered 2014-09-27: 150 mg via INTRAVENOUS
  Filled 2014-09-27: qty 7.5

## 2014-09-27 MED ORDER — PROCHLORPERAZINE MALEATE 10 MG PO TABS
10.0000 mg | ORAL_TABLET | Freq: Once | ORAL | Status: AC
  Administered 2014-09-27: 10 mg via ORAL

## 2014-09-27 MED ORDER — SODIUM CHLORIDE 0.9 % IV SOLN
Freq: Once | INTRAVENOUS | Status: AC
  Administered 2014-09-27: 09:00:00 via INTRAVENOUS

## 2014-09-27 NOTE — Patient Instructions (Signed)
[facility] Discharge Instructions for Patients Receiving Chemotherapy  Today you received the following chemotherapy agents Etoposide.   To help prevent nausea and vomiting after your treatment, we encourage you to take your nausea medication as prescribed.   If you develop nausea and vomiting that is not controlled by your nausea medication, call the clinic.   BELOW ARE SYMPTOMS THAT SHOULD BE REPORTED IMMEDIATELY:  *FEVER GREATER THAN 100.5 F  *CHILLS WITH OR WITHOUT FEVER  NAUSEA AND VOMITING THAT IS NOT CONTROLLED WITH YOUR NAUSEA MEDICATION  *UNUSUAL SHORTNESS OF BREATH  *UNUSUAL BRUISING OR BLEEDING  TENDERNESS IN MOUTH AND THROAT WITH OR WITHOUT PRESENCE OF ULCERS  *URINARY PROBLEMS  *BOWEL PROBLEMS  UNUSUAL RASH Items with * indicate a potential emergency and should be followed up as soon as possible.  Feel free to call the clinic you have any questions or concerns. The clinic phone number is [phone].  Please show the CHEMO ALERT CARD at check-in to the Emergency Department and triage nurse.   

## 2014-09-29 ENCOUNTER — Ambulatory Visit (HOSPITAL_BASED_OUTPATIENT_CLINIC_OR_DEPARTMENT_OTHER): Payer: BLUE CROSS/BLUE SHIELD

## 2014-09-29 VITALS — BP 114/95 | HR 95 | Temp 98.3°F

## 2014-09-29 DIAGNOSIS — C7B8 Other secondary neuroendocrine tumors: Secondary | ICD-10-CM

## 2014-09-29 DIAGNOSIS — C7A1 Malignant poorly differentiated neuroendocrine tumors: Secondary | ICD-10-CM

## 2014-09-29 DIAGNOSIS — Z5189 Encounter for other specified aftercare: Secondary | ICD-10-CM | POA: Diagnosis not present

## 2014-09-29 DIAGNOSIS — C3491 Malignant neoplasm of unspecified part of right bronchus or lung: Secondary | ICD-10-CM

## 2014-09-29 MED ORDER — PEGFILGRASTIM INJECTION 6 MG/0.6ML ~~LOC~~
6.0000 mg | PREFILLED_SYRINGE | Freq: Once | SUBCUTANEOUS | Status: AC
  Administered 2014-09-29: 6 mg via SUBCUTANEOUS
  Filled 2014-09-29: qty 0.6

## 2014-10-02 ENCOUNTER — Other Ambulatory Visit (HOSPITAL_BASED_OUTPATIENT_CLINIC_OR_DEPARTMENT_OTHER): Payer: BLUE CROSS/BLUE SHIELD

## 2014-10-02 ENCOUNTER — Ambulatory Visit: Payer: BLUE CROSS/BLUE SHIELD

## 2014-10-02 ENCOUNTER — Telehealth: Payer: Self-pay | Admitting: *Deleted

## 2014-10-02 VITALS — BP 115/92 | HR 90 | Temp 97.8°F | Resp 16

## 2014-10-02 DIAGNOSIS — Z95828 Presence of other vascular implants and grafts: Secondary | ICD-10-CM

## 2014-10-02 DIAGNOSIS — C7A1 Malignant poorly differentiated neuroendocrine tumors: Secondary | ICD-10-CM | POA: Diagnosis not present

## 2014-10-02 DIAGNOSIS — C7B8 Other secondary neuroendocrine tumors: Secondary | ICD-10-CM | POA: Diagnosis not present

## 2014-10-02 DIAGNOSIS — M79601 Pain in right arm: Secondary | ICD-10-CM

## 2014-10-02 DIAGNOSIS — C3491 Malignant neoplasm of unspecified part of right bronchus or lung: Secondary | ICD-10-CM

## 2014-10-02 LAB — COMPREHENSIVE METABOLIC PANEL (CC13)
ALK PHOS: 114 U/L (ref 40–150)
ALT: 13 U/L (ref 0–55)
ANION GAP: 8 meq/L (ref 3–11)
AST: 16 U/L (ref 5–34)
Albumin: 3.2 g/dL — ABNORMAL LOW (ref 3.5–5.0)
BUN: 13.7 mg/dL (ref 7.0–26.0)
CALCIUM: 8.9 mg/dL (ref 8.4–10.4)
CHLORIDE: 104 meq/L (ref 98–109)
CO2: 28 meq/L (ref 22–29)
Creatinine: 0.8 mg/dL (ref 0.7–1.3)
Glucose: 126 mg/dl (ref 70–140)
Potassium: 4.1 mEq/L (ref 3.5–5.1)
SODIUM: 141 meq/L (ref 136–145)
TOTAL PROTEIN: 6.5 g/dL (ref 6.4–8.3)
Total Bilirubin: 0.51 mg/dL (ref 0.20–1.20)

## 2014-10-02 LAB — CBC WITH DIFFERENTIAL/PLATELET
BASO%: 0.4 % (ref 0.0–2.0)
Basophils Absolute: 0 10*3/uL (ref 0.0–0.1)
EOS%: 0 % (ref 0.0–7.0)
Eosinophils Absolute: 0 10*3/uL (ref 0.0–0.5)
HCT: 24.5 % — ABNORMAL LOW (ref 38.4–49.9)
HEMOGLOBIN: 8.5 g/dL — AB (ref 13.0–17.1)
LYMPH%: 9.5 % — ABNORMAL LOW (ref 14.0–49.0)
MCH: 32.3 pg (ref 27.2–33.4)
MCHC: 34.7 g/dL (ref 32.0–36.0)
MCV: 93.2 fL (ref 79.3–98.0)
MONO#: 0.1 10*3/uL (ref 0.1–0.9)
MONO%: 2.4 % (ref 0.0–14.0)
NEUT#: 4.1 10*3/uL (ref 1.5–6.5)
NEUT%: 87.7 % — AB (ref 39.0–75.0)
PLATELETS: 189 10*3/uL (ref 140–400)
RBC: 2.63 10*6/uL — AB (ref 4.20–5.82)
RDW: 11.7 % (ref 11.0–14.6)
WBC: 4.6 10*3/uL (ref 4.0–10.3)
lymph#: 0.4 10*3/uL — ABNORMAL LOW (ref 0.9–3.3)

## 2014-10-02 MED ORDER — HEPARIN SOD (PORK) LOCK FLUSH 100 UNIT/ML IV SOLN
500.0000 [IU] | Freq: Once | INTRAVENOUS | Status: AC
  Administered 2014-10-02: 500 [IU] via INTRAVENOUS
  Filled 2014-10-02: qty 5

## 2014-10-02 MED ORDER — SODIUM CHLORIDE 0.9 % IJ SOLN
10.0000 mL | INTRAMUSCULAR | Status: DC | PRN
Start: 2014-10-02 — End: 2014-10-02
  Administered 2014-10-02: 10 mL via INTRAVENOUS
  Filled 2014-10-02: qty 10

## 2014-10-02 NOTE — Patient Instructions (Signed)

## 2014-10-02 NOTE — Telephone Encounter (Signed)
Spouse called requesting "advice for constipation.  Last bowel movement was early last week.  He is passing a little gas.  He drinks 2 Liters daily.  Takes stool softener.  Took Milk of Mag last week.  He is a little nauseated.  His abdomen is not swollen or bloated.  I don't think he needs to come in, he was there for blood work already today."  This nurse asked if he's tried Mira lax and he hasn't recently.  He also hasn't tried Dulcolax tablets or senokot.  She says he will try these things.  Advised that they call with results and due to his ise of pain medicines he needs to take laxatives daily.

## 2014-10-02 NOTE — Telephone Encounter (Signed)
Called [name] to check status of Mr. Kliebert.  "He is no longer nauseated.  Increase in gas production after drinking half bottle of Magnesium citrate. He said he'll take something else at 5:00 if bowels don't move.  He doesn't drink hot tea or coffee but did drink warm milk.  He is better than he was this morning.  I bought suppositories and dulcolax too but don't want to give him too much.  Says she will call sometime tomorrow with an update.

## 2014-10-09 ENCOUNTER — Ambulatory Visit: Payer: BLUE CROSS/BLUE SHIELD

## 2014-10-09 ENCOUNTER — Other Ambulatory Visit: Payer: Self-pay | Admitting: Medical Oncology

## 2014-10-09 ENCOUNTER — Other Ambulatory Visit (HOSPITAL_BASED_OUTPATIENT_CLINIC_OR_DEPARTMENT_OTHER): Payer: BLUE CROSS/BLUE SHIELD

## 2014-10-09 ENCOUNTER — Telehealth: Payer: Self-pay | Admitting: Medical Oncology

## 2014-10-09 ENCOUNTER — Ambulatory Visit (HOSPITAL_BASED_OUTPATIENT_CLINIC_OR_DEPARTMENT_OTHER): Payer: BLUE CROSS/BLUE SHIELD

## 2014-10-09 ENCOUNTER — Other Ambulatory Visit: Payer: BLUE CROSS/BLUE SHIELD

## 2014-10-09 ENCOUNTER — Ambulatory Visit (HOSPITAL_COMMUNITY)
Admission: RE | Admit: 2014-10-09 | Discharge: 2014-10-09 | Disposition: A | Discharge status: Home / Self Care | Payer: BLUE CROSS/BLUE SHIELD | Source: Ambulatory Visit | Attending: Internal Medicine | Admitting: Internal Medicine

## 2014-10-09 VITALS — BP 104/63 | HR 67 | Temp 97.4°F | Resp 18

## 2014-10-09 DIAGNOSIS — C7A1 Malignant poorly differentiated neuroendocrine tumors: Secondary | ICD-10-CM

## 2014-10-09 DIAGNOSIS — K1230 Oral mucositis (ulcerative), unspecified: Secondary | ICD-10-CM

## 2014-10-09 DIAGNOSIS — T451X5A Adverse effect of antineoplastic and immunosuppressive drugs, initial encounter: Secondary | ICD-10-CM | POA: Insufficient documentation

## 2014-10-09 DIAGNOSIS — C3490 Malignant neoplasm of unspecified part of unspecified bronchus or lung: Secondary | ICD-10-CM | POA: Diagnosis not present

## 2014-10-09 DIAGNOSIS — R918 Other nonspecific abnormal finding of lung field: Secondary | ICD-10-CM

## 2014-10-09 DIAGNOSIS — M545 Low back pain, unspecified: Secondary | ICD-10-CM

## 2014-10-09 DIAGNOSIS — C7B8 Other secondary neuroendocrine tumors: Secondary | ICD-10-CM

## 2014-10-09 DIAGNOSIS — M79601 Pain in right arm: Secondary | ICD-10-CM

## 2014-10-09 DIAGNOSIS — D696 Thrombocytopenia, unspecified: Secondary | ICD-10-CM

## 2014-10-09 DIAGNOSIS — C3492 Malignant neoplasm of unspecified part of left bronchus or lung: Secondary | ICD-10-CM

## 2014-10-09 DIAGNOSIS — D6481 Anemia due to antineoplastic chemotherapy: Secondary | ICD-10-CM

## 2014-10-09 DIAGNOSIS — C3491 Malignant neoplasm of unspecified part of right bronchus or lung: Secondary | ICD-10-CM

## 2014-10-09 LAB — CBC WITH DIFFERENTIAL/PLATELET
BASO%: 0.6 % (ref 0.0–2.0)
BASOS ABS: 0.1 10*3/uL (ref 0.0–0.1)
EOS ABS: 0 10*3/uL (ref 0.0–0.5)
EOS%: 0.1 % (ref 0.0–7.0)
HCT: 19.7 % — ABNORMAL LOW (ref 38.4–49.9)
HGB: 7 g/dL — ABNORMAL LOW (ref 13.0–17.1)
LYMPH#: 1.5 10*3/uL (ref 0.9–3.3)
LYMPH%: 13.6 % — ABNORMAL LOW (ref 14.0–49.0)
MCH: 31.5 pg (ref 27.2–33.4)
MCHC: 35.5 g/dL (ref 32.0–36.0)
MCV: 88.7 fL (ref 79.3–98.0)
MONO#: 1.6 10*3/uL — AB (ref 0.1–0.9)
MONO%: 15.3 % — ABNORMAL HIGH (ref 0.0–14.0)
NEUT#: 7.5 10*3/uL — ABNORMAL HIGH (ref 1.5–6.5)
NEUT%: 70.4 % (ref 39.0–75.0)
Platelets: 8 10*3/uL — CL (ref 140–400)
RBC: 2.22 10*6/uL — AB (ref 4.20–5.82)
RDW: 11.7 % (ref 11.0–14.6)
WBC: 10.7 10*3/uL — AB (ref 4.0–10.3)
nRBC: 0 % (ref 0–0)

## 2014-10-09 LAB — COMPREHENSIVE METABOLIC PANEL (CC13)
ALBUMIN: 3.3 g/dL — AB (ref 3.5–5.0)
ALT: 14 U/L (ref 0–55)
AST: 16 U/L (ref 5–34)
Alkaline Phosphatase: 119 U/L (ref 40–150)
Anion Gap: 8 mEq/L (ref 3–11)
BUN: 7.9 mg/dL (ref 7.0–26.0)
CO2: 27 meq/L (ref 22–29)
Calcium: 8.9 mg/dL (ref 8.4–10.4)
Chloride: 104 mEq/L (ref 98–109)
Creatinine: 0.8 mg/dL (ref 0.7–1.3)
GLUCOSE: 97 mg/dL (ref 70–140)
Potassium: 4 mEq/L (ref 3.5–5.1)
SODIUM: 140 meq/L (ref 136–145)
TOTAL PROTEIN: 6.2 g/dL — AB (ref 6.4–8.3)

## 2014-10-09 LAB — HOLD TUBE, BLOOD BANK

## 2014-10-09 LAB — PREPARE RBC (CROSSMATCH)

## 2014-10-09 MED ORDER — OXYCODONE HCL 5 MG PO TABS: 5.0000 mg | ORAL_TABLET | Freq: Four times a day (QID) | ORAL | Status: DC | PRN

## 2014-10-09 MED ORDER — LORAZEPAM 0.5 MG PO TABS: 0.5000 mg | ORAL_TABLET | Freq: Two times a day (BID) | ORAL | Status: DC

## 2014-10-09 MED ORDER — DIPHENHYDRAMINE HCL 25 MG PO CAPS
25.0000 mg | ORAL_CAPSULE | Freq: Once | ORAL | Status: AC
  Administered 2014-10-09: 25 mg via ORAL

## 2014-10-09 MED ORDER — ACETAMINOPHEN 325 MG PO TABS
650.0000 mg | ORAL_TABLET | Freq: Once | ORAL | Status: AC
  Administered 2014-10-09: 650 mg via ORAL

## 2014-10-09 MED ORDER — DIPHENHYDRAMINE HCL 25 MG PO CAPS
ORAL_CAPSULE | ORAL | Status: AC
  Filled 2014-10-09: qty 1

## 2014-10-09 MED ORDER — MORPHINE SULFATE ER 30 MG PO TBCR: 30.0000 mg | EXTENDED_RELEASE_TABLET | Freq: Two times a day (BID) | ORAL | Status: DC

## 2014-10-09 MED ORDER — HEPARIN SOD (PORK) LOCK FLUSH 100 UNIT/ML IV SOLN
500.0000 [IU] | Freq: Once | INTRAVENOUS | Status: AC
  Administered 2014-10-09: 500 [IU] via INTRAVENOUS
  Filled 2014-10-09: qty 5

## 2014-10-09 MED ORDER — SODIUM CHLORIDE 0.9 % IJ SOLN
10.0000 mL | INTRAMUSCULAR | Status: DC | PRN
  Administered 2014-10-09: 10 mL via INTRAVENOUS
  Filled 2014-10-09: qty 10

## 2014-10-09 MED ORDER — SODIUM CHLORIDE 0.9 % IV SOLN
250.0000 mL | Freq: Once | INTRAVENOUS | Status: AC
  Administered 2014-10-09: 250 mL via INTRAVENOUS

## 2014-10-09 MED ORDER — ACETAMINOPHEN 325 MG PO TABS
ORAL_TABLET | ORAL | Status: AC
  Filled 2014-10-09: qty 2

## 2014-10-09 NOTE — Patient Instructions (Signed)

## 2014-10-09 NOTE — Patient Instructions (Signed)

## 2014-10-09 NOTE — Telephone Encounter (Signed)
Called pt to come back for type and cross and for appts for platelets and blood

## 2014-10-09 NOTE — Progress Notes (Signed)
oxycodone ,ms contin and ativan rx given to pt.

## 2014-10-10 ENCOUNTER — Other Ambulatory Visit: Payer: BLUE CROSS/BLUE SHIELD

## 2014-10-10 ENCOUNTER — Ambulatory Visit (HOSPITAL_BASED_OUTPATIENT_CLINIC_OR_DEPARTMENT_OTHER): Payer: BLUE CROSS/BLUE SHIELD

## 2014-10-10 VITALS — BP 109/72 | HR 63 | Temp 98.2°F | Resp 18

## 2014-10-10 DIAGNOSIS — D696 Thrombocytopenia, unspecified: Secondary | ICD-10-CM | POA: Diagnosis not present

## 2014-10-10 DIAGNOSIS — D6481 Anemia due to antineoplastic chemotherapy: Secondary | ICD-10-CM | POA: Diagnosis not present

## 2014-10-10 DIAGNOSIS — C3490 Malignant neoplasm of unspecified part of unspecified bronchus or lung: Secondary | ICD-10-CM

## 2014-10-10 LAB — TYPE AND SCREEN
ABO/RH(D): A POS
ANTIBODY SCREEN: NEGATIVE
UNIT DIVISION: 0
Unit division: 0

## 2014-10-10 LAB — PREPARE PLATELET PHERESIS: Unit division: 0

## 2014-10-10 LAB — HOLD TUBE, BLOOD BANK

## 2014-10-10 LAB — PREPARE RBC (CROSSMATCH)

## 2014-10-10 MED ORDER — DIPHENHYDRAMINE HCL 25 MG PO CAPS
25.0000 mg | ORAL_CAPSULE | Freq: Once | ORAL | Status: AC
  Administered 2014-10-10: 25 mg via ORAL

## 2014-10-10 MED ORDER — DIPHENHYDRAMINE HCL 25 MG PO CAPS
ORAL_CAPSULE | ORAL | Status: AC
  Filled 2014-10-10: qty 1

## 2014-10-10 MED ORDER — SODIUM CHLORIDE 0.9 % IV SOLN
250.0000 mL | Freq: Once | INTRAVENOUS | Status: AC
  Administered 2014-10-10: 250 mL via INTRAVENOUS

## 2014-10-10 MED ORDER — SODIUM CHLORIDE 0.9 % IJ SOLN
10.0000 mL | INTRAMUSCULAR | Status: AC | PRN
  Administered 2014-10-10: 10 mL
  Filled 2014-10-10: qty 10

## 2014-10-10 MED ORDER — HEPARIN SOD (PORK) LOCK FLUSH 100 UNIT/ML IV SOLN
500.0000 [IU] | Freq: Every day | INTRAVENOUS | Status: AC | PRN
  Administered 2014-10-10: 500 [IU]
  Filled 2014-10-10: qty 5

## 2014-10-10 MED ORDER — ACETAMINOPHEN 325 MG PO TABS
ORAL_TABLET | ORAL | Status: AC
  Filled 2014-10-10: qty 2

## 2014-10-10 MED ORDER — ACETAMINOPHEN 325 MG PO TABS
650.0000 mg | ORAL_TABLET | Freq: Once | ORAL | Status: AC
  Administered 2014-10-10: 650 mg via ORAL

## 2014-10-10 NOTE — Telephone Encounter (Signed)
This encounter closed as patient has come in for lab, platelets and blood transfusion.

## 2014-10-10 NOTE — Patient Instructions (Signed)

## 2014-10-11 LAB — TYPE AND SCREEN
ABO/RH(D): A POS
Antibody Screen: NEGATIVE
Unit division: 0

## 2014-10-16 ENCOUNTER — Telehealth: Payer: Self-pay | Admitting: Internal Medicine

## 2014-10-16 ENCOUNTER — Ambulatory Visit: Payer: BLUE CROSS/BLUE SHIELD

## 2014-10-16 ENCOUNTER — Ambulatory Visit (HOSPITAL_BASED_OUTPATIENT_CLINIC_OR_DEPARTMENT_OTHER): Payer: BLUE CROSS/BLUE SHIELD

## 2014-10-16 ENCOUNTER — Other Ambulatory Visit: Payer: BLUE CROSS/BLUE SHIELD

## 2014-10-16 ENCOUNTER — Other Ambulatory Visit (HOSPITAL_BASED_OUTPATIENT_CLINIC_OR_DEPARTMENT_OTHER): Payer: BLUE CROSS/BLUE SHIELD

## 2014-10-16 ENCOUNTER — Encounter: Payer: Self-pay | Admitting: Internal Medicine

## 2014-10-16 ENCOUNTER — Ambulatory Visit (HOSPITAL_BASED_OUTPATIENT_CLINIC_OR_DEPARTMENT_OTHER): Payer: BLUE CROSS/BLUE SHIELD | Admitting: Internal Medicine

## 2014-10-16 ENCOUNTER — Other Ambulatory Visit: Payer: Self-pay | Admitting: *Deleted

## 2014-10-16 ENCOUNTER — Encounter: Payer: Self-pay | Admitting: *Deleted

## 2014-10-16 VITALS — BP 116/78 | HR 70 | Temp 96.9°F | Resp 20

## 2014-10-16 DIAGNOSIS — C7B8 Other secondary neuroendocrine tumors: Secondary | ICD-10-CM

## 2014-10-16 DIAGNOSIS — C3491 Malignant neoplasm of unspecified part of right bronchus or lung: Secondary | ICD-10-CM

## 2014-10-16 DIAGNOSIS — R0602 Shortness of breath: Secondary | ICD-10-CM | POA: Diagnosis not present

## 2014-10-16 DIAGNOSIS — Z95828 Presence of other vascular implants and grafts: Secondary | ICD-10-CM

## 2014-10-16 DIAGNOSIS — C7A1 Malignant poorly differentiated neuroendocrine tumors: Secondary | ICD-10-CM

## 2014-10-16 DIAGNOSIS — Z5111 Encounter for antineoplastic chemotherapy: Secondary | ICD-10-CM | POA: Diagnosis not present

## 2014-10-16 DIAGNOSIS — C3490 Malignant neoplasm of unspecified part of unspecified bronchus or lung: Secondary | ICD-10-CM

## 2014-10-16 DIAGNOSIS — D61818 Other pancytopenia: Secondary | ICD-10-CM | POA: Diagnosis not present

## 2014-10-16 DIAGNOSIS — M79601 Pain in right arm: Secondary | ICD-10-CM

## 2014-10-16 LAB — CBC WITH DIFFERENTIAL/PLATELET
BASO%: 0.5 % (ref 0.0–2.0)
Basophils Absolute: 0.1 10*3/uL (ref 0.0–0.1)
EOS%: 0.1 % (ref 0.0–7.0)
Eosinophils Absolute: 0 10*3/uL (ref 0.0–0.5)
HCT: 33.1 % — ABNORMAL LOW (ref 38.4–49.9)
HGB: 11.3 g/dL — ABNORMAL LOW (ref 13.0–17.1)
LYMPH#: 0.7 10*3/uL — AB (ref 0.9–3.3)
LYMPH%: 5.2 % — AB (ref 14.0–49.0)
MCH: 31.3 pg (ref 27.2–33.4)
MCHC: 34 g/dL (ref 32.0–36.0)
MCV: 92.1 fL (ref 79.3–98.0)
MONO#: 1.4 10*3/uL — ABNORMAL HIGH (ref 0.1–0.9)
MONO%: 10 % (ref 0.0–14.0)
NEUT#: 11.9 10*3/uL — ABNORMAL HIGH (ref 1.5–6.5)
NEUT%: 84.2 % — AB (ref 39.0–75.0)
Platelets: 142 10*3/uL (ref 140–400)
RBC: 3.59 10*6/uL — AB (ref 4.20–5.82)
RDW: 12.3 % (ref 11.0–14.6)
WBC: 14.1 10*3/uL — ABNORMAL HIGH (ref 4.0–10.3)

## 2014-10-16 LAB — COMPREHENSIVE METABOLIC PANEL (CC13)
ALT: 11 U/L (ref 0–55)
AST: 13 U/L (ref 5–34)
Albumin: 3.2 g/dL — ABNORMAL LOW (ref 3.5–5.0)
Alkaline Phosphatase: 114 U/L (ref 40–150)
Anion Gap: 10 mEq/L (ref 3–11)
BUN: 7.6 mg/dL (ref 7.0–26.0)
CHLORIDE: 104 meq/L (ref 98–109)
CO2: 26 meq/L (ref 22–29)
CREATININE: 1 mg/dL (ref 0.7–1.3)
Calcium: 9.4 mg/dL (ref 8.4–10.4)
EGFR: 84 mL/min/{1.73_m2} — ABNORMAL LOW (ref >90)
Glucose: 126 mg/dl (ref 70–140)
Potassium: 4 mEq/L (ref 3.5–5.1)
SODIUM: 140 meq/L (ref 136–145)
Total Bilirubin: <0.2 mg/dL (ref 0.20–1.20)
Total Protein: 6.7 g/dL (ref 6.4–8.3)

## 2014-10-16 MED ORDER — SODIUM CHLORIDE 0.9 % IV SOLN
Freq: Once | INTRAVENOUS | Status: AC
  Administered 2014-10-16: 11:00:00 via INTRAVENOUS

## 2014-10-16 MED ORDER — SODIUM CHLORIDE 0.9 % IJ SOLN
10.0000 mL | INTRAMUSCULAR | Status: DC | PRN
  Administered 2014-10-16: 10 mL via INTRAVENOUS
  Filled 2014-10-16: qty 10

## 2014-10-16 MED ORDER — SODIUM CHLORIDE 0.9 % IJ SOLN
10.0000 mL | INTRAMUSCULAR | Status: DC | PRN
  Administered 2014-10-16: 10 mL
  Filled 2014-10-16: qty 10

## 2014-10-16 MED ORDER — HEPARIN SOD (PORK) LOCK FLUSH 100 UNIT/ML IV SOLN
500.0000 [IU] | Freq: Once | INTRAVENOUS | Status: AC | PRN
  Administered 2014-10-16: 500 [IU]
  Filled 2014-10-16: qty 5

## 2014-10-16 MED ORDER — SODIUM CHLORIDE 0.9 % IV SOLN
100.0000 mg/m2 | Freq: Once | INTRAVENOUS | Status: AC
  Administered 2014-10-16: 150 mg via INTRAVENOUS
  Filled 2014-10-16: qty 7.5

## 2014-10-16 MED ORDER — SODIUM CHLORIDE 0.9 % IV SOLN
392.5000 mg | Freq: Once | INTRAVENOUS | Status: AC
  Administered 2014-10-16: 390 mg via INTRAVENOUS
  Filled 2014-10-16: qty 39

## 2014-10-16 MED ORDER — SODIUM CHLORIDE 0.9 % IV SOLN
Freq: Once | INTRAVENOUS | Status: AC
  Administered 2014-10-16: 12:00:00 via INTRAVENOUS
  Filled 2014-10-16: qty 8

## 2014-10-16 MED ORDER — CARBOPLATIN CHEMO INTRADERMAL TEST DOSE 100MCG/0.02ML
100.0000 ug | Freq: Once | INTRADERMAL | Status: AC
  Administered 2014-10-16: 100 ug via INTRADERMAL
  Filled 2014-10-16: qty 0.01

## 2014-10-16 NOTE — Telephone Encounter (Signed)
Gave adn printed appt sched adna vs for pt for Aug and SEpt

## 2014-10-16 NOTE — Progress Notes (Signed)
Oncology Nurse Navigator Documentation  Oncology Nurse Navigator Flowsheets 10/16/2014  Navigator Encounter Type Clinic/MDC/spoke with patient and family today.  He is in need of chemo card.  I gave him card and explained.    Patient Visit Type Initial  Treatment Phase Treatment  Barriers/Navigation Needs Education  Education Other  Time Spent with Patient 15

## 2014-10-16 NOTE — Patient Instructions (Signed)

## 2014-10-16 NOTE — Patient Instructions (Addendum)
[facility] Discharge Instructions for Patients Receiving Chemotherapy  Today you received the following chemotherapy agents: Etoposide and Carboplatin   To help prevent nausea and vomiting after your treatment, we encourage you to take your nausea medication as directed.    If you develop nausea and vomiting that is not controlled by your nausea medication, call the clinic.   BELOW ARE SYMPTOMS THAT SHOULD BE REPORTED IMMEDIATELY:  *FEVER GREATER THAN 100.5 F  *CHILLS WITH OR WITHOUT FEVER  NAUSEA AND VOMITING THAT IS NOT CONTROLLED WITH YOUR NAUSEA MEDICATION  *UNUSUAL SHORTNESS OF BREATH  *UNUSUAL BRUISING OR BLEEDING  TENDERNESS IN MOUTH AND THROAT WITH OR WITHOUT PRESENCE OF ULCERS  *URINARY PROBLEMS  *BOWEL PROBLEMS  UNUSUAL RASH Items with * indicate a potential emergency and should be followed up as soon as possible.  Feel free to call the clinic you have any questions or concerns. The clinic phone number is [phone].  Please show the CHEMO ALERT CARD at check-in to the Emergency Department and triage nurse.  

## 2014-10-16 NOTE — Progress Notes (Signed)
[facility] [phone]   Fax:([phone]) [place], MD 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  PRIOR THERAPY: 1. First-line chemotherapy with 5 cycles of carboplatin (cisplatin for the first cycle) and etoposide from 05/18/2013 to 10/15/2013.  2. WB XRT and palliative chest radiation given on 05/03/13 - 05/18/13 per Dr. Pablo Ledger.  CURRENT THERAPY: Systemic chemotherapy with carboplatin for AUC of 5 on day 1 and etoposide 100 MG/M2 on days 1, 2 and 3 with Neulasta support on day 4. Status post 2 cycles.   INTERVAL HISTORY: Ricky Villanueva 64 y.o. male returns to the clinic today for follow-up visit accompanied by his wife and daughter. The patient tolerated the second cycle of his systemic chemotherapy fairly well with no significant adverse effects except for mild nausea. He also has pancytopenia and required PRBCs transfusion as well as platelet transfusion after the second cycle of his treatment. He denied having any significant chest pain but has mild shortness of breath. He denied having any vomiting. He has no fever or chills. He is here today to start cycle #3 of his systemic chemotherapy.   MEDICAL HISTORY: Past Medical History  Diagnosis Date  . Hyperlipidemia   . Hypertension   . GERD (gastroesophageal reflux disease)   . Respiratory failure with hypoxia 04/21/2013    secondary to pneumonia/notes 04/21/2013  . Pulmonary embolism     "got one in there now" (04/21/2013)  . COPD (chronic obstructive pulmonary disease)   . Pneumonia 2/?01/2014    "wouldn't get better; hospitalized 04/21/2013)  . Arthritis     "minor in my right hand" (04/21/2013)  . History of radiation therapy 05/03/13-05/18/13    chest & whole brain  . Cancer     lung ca dx'd 03/2013  . Colon polyps     hyperplastic  . Right arm pain  08/21/2014    ALLERGIES:  is allergic to decadron and augmentin.  MEDICATIONS:  Current Outpatient Prescriptions  Medication Sig Dispense Refill  . albuterol (PROVENTIL HFA;VENTOLIN HFA) 108 (90 BASE) MCG/ACT inhaler Inhale 2 puffs into the lungs every 6 (six) hours as needed for wheezing or shortness of breath. (Patient not taking: Reported on 09/25/2014) 1 Inhaler 2  . chlorpheniramine-HYDROcodone (TUSSIONEX PENNKINETIC ER) 10-8 MG/5ML SUER Take 5 mLs by mouth every 12 (twelve) hours as needed for cough. 140 mL 0  . dextromethorphan 15 MG/5ML syrup Take 10 mLs by mouth as needed for cough.    . docusate sodium (COLACE) 100 MG capsule Take 300 mg by mouth 2 (two) times daily as needed for mild constipation.     . Iron-Vitamins (GERITOL PO) Take 1 tablet by mouth daily.    [name] lidocaine-prilocaine (EMLA) cream Apply to port site one hour before treatment and cover with plastic wrap. 1 each 2  . LORazepam (ATIVAN) 0.5 MG tablet Take 1 tablet (0.5 mg total) by mouth 2 (two) times daily. for anxiety 60 tablet 3  . magnesium hydroxide (MILK OF MAGNESIA) 400 MG/5ML suspension Take 5 mLs by mouth daily as needed for mild constipation.    . mirtazapine (REMERON) 30 MG tablet Take 1 tablet (30 mg total) by mouth at bedtime. 90 tablet 1  . morphine (MS CONTIN) 30 MG 12 hr tablet Take 1 tablet (30 mg total) by mouth every 12 (twelve) hours. 60 tablet 0  .  omeprazole (PRILOSEC) 40 MG capsule Take 40 mg by mouth daily.     . ondansetron (ZOFRAN) 8 MG tablet Take 1 tablet (8 mg total) by mouth every 8 (eight) hours as needed for nausea or vomiting (nausea). (Patient not taking: Reported on 09/25/2014) 20 tablet 3  . oxyCODONE (OXY IR/ROXICODONE) 5 MG immediate release tablet Take 1 tablet (5 mg total) by mouth every 6 (six) hours as needed for severe pain. 120 tablet 0  . polyethylene glycol powder (MIRALAX) powder Take 17 g by mouth daily. (Patient taking differently: Take 17 g by mouth daily as needed for mild  constipation or moderate constipation. ) 255 g 0  . prochlorperazine (COMPAZINE) 10 MG tablet Take 1 tablet (10 mg total) by mouth every 6 (six) hours as needed for nausea or vomiting (nausea). (Patient not taking: Reported on 09/25/2014) 30 tablet 3  . rivaroxaban (XARELTO) 20 MG TABS tablet Take 1 tablet (20 mg total) by mouth daily with supper. 30 tablet 1  . tiotropium (SPIRIVA HANDIHALER) 18 MCG inhalation capsule Place 1 capsule (18 mcg total) into inhaler and inhale daily. 90 capsule 1  . vitamin B-12 (CYANOCOBALAMIN) 100 MCG tablet Take 100 mcg by mouth daily.     No current facility-administered medications for this visit.   Facility-Administered Medications Ordered in Other Visits  Medication Dose Route Frequency Provider Last Rate Last Dose  . 0.9 %  sodium chloride infusion  1,000 mL Intravenous Once Concha Norway, MD      . 0.9 %  sodium chloride infusion   Intravenous Once Concha Norway, MD        SURGICAL HISTORY:  Past Surgical History  Procedure Laterality Date  . Inguinal hernia repair Right ~ 2005  . Finger surgery Left ~ 1989    "crushed end of my middle finger off"   . Video bronchoscopy Bilateral 04/26/2013    Procedure: VIDEO BRONCHOSCOPY WITH FLUORO;  Surgeon: Wilhelmina Mcardle, MD;  Location: [facility] ENDOSCOPY;  Service: Cardiopulmonary;  Laterality: Bilateral;    REVIEW OF SYSTEMS:  Constitutional: positive for fatigue Eyes: negative Ears, nose, mouth, throat, and face: negative Respiratory: positive for cough and dyspnea on exertion Cardiovascular: negative Gastrointestinal: negative Genitourinary:negative Integument/breast: negative Hematologic/lymphatic: negative Musculoskeletal:negative Neurological: negative Behavioral/Psych: negative Endocrine: negative Allergic/Immunologic: negative   PHYSICAL EXAMINATION: General appearance: alert, cooperative, fatigued and no distress Head: Normocephalic, without obvious abnormality, atraumatic Neck: no adenopathy, no JVD,  supple, symmetrical, trachea midline and thyroid not enlarged, symmetric, no tenderness/mass/nodules Lymph nodes: Cervical, supraclavicular, and axillary nodes normal. Resp: clear to auscultation bilaterally Back: symmetric, no curvature. ROM normal. No CVA tenderness. Cardio: regular rate and rhythm, S1, S2 normal, no murmur, click, rub or gallop GI: soft, non-tender; bowel sounds normal; no masses,  no organomegaly Extremities: extremities normal, atraumatic, no cyanosis or edema Neurologic: Alert and oriented X 3, normal strength and tone. Normal symmetric reflexes. Normal coordination and gait  ECOG PERFORMANCE STATUS: 1 - Symptomatic but completely ambulatory  There were no vitals taken for this visit.  LABORATORY DATA: Lab Results  Component Value Date   WBC 14.1* 10/16/2014   HGB 11.3* 10/16/2014   HCT 33.1* 10/16/2014   MCV 92.1 10/16/2014   PLT 142 10/16/2014      Chemistry      Component Value Date/Time   NA 140 10/16/2014 0811   NA 139 09/01/2014 1313   K 4.0 10/16/2014 0811   K 4.5 09/01/2014 1313   CL 103 09/01/2014 1313   CO2 26 10/16/2014  0811   CO2 27 09/01/2014 1313   BUN 7.6 10/16/2014 0811   BUN 11 09/01/2014 1313   CREATININE 1.0 10/16/2014 0811   CREATININE 0.92 09/01/2014 1313      Component Value Date/Time   CALCIUM 9.4 10/16/2014 0811   CALCIUM 9.5 09/01/2014 1313   ALKPHOS 114 10/16/2014 0811   ALKPHOS 67 09/01/2014 1313   AST 13 10/16/2014 0811   AST 20 09/01/2014 1313   ALT 11 10/16/2014 0811   ALT 14* 09/01/2014 1313   BILITOT <0.20 10/16/2014 0811   BILITOT 0.9 09/01/2014 1313       RADIOGRAPHIC STUDIES: No results found.  ASSESSMENT AND PLAN: This is a very pleasant 64 years old white male with extensive stage small cell lung cancer status post palliative radiotherapy to the brain and chest as well as 6 cycles of systemic chemotherapy with carboplatin and etoposide completed in August 2015. The patient has been observation for the  last 10 months. The recent CT scan of the chest, abdomen and pelvis showed evidence for disease progression with multiple ill-defined soft tissue masses in the right lower lobe and an enlarging subdiaphragmatic lymph nodes and a new suspicious hepatic metastasis. He is currently on systemic chemotherapy with carboplatin and etoposide, status post 2 cycles and tolerated the second dose of his treatment fairly well.  We will proceed with cycle #3 today as scheduled. The patient would come back for follow-up visit in 3 weeks for evaluation after repeating CT scan of the chest, abdomen and pelvis for restaging of his disease. The patient was advised to call if he has any concerning symptoms in the interval. The patient voices understanding of current disease status and treatment options and is in agreement with the current care plan.  All questions were answered. The patient knows to call the clinic with any problems, questions or concerns. We can certainly see the patient much sooner if necessary.  Disclaimer: This note was dictated with voice recognition software. Similar sounding words can inadvertently be transcribed and may not be corrected upon review.

## 2014-10-17 ENCOUNTER — Ambulatory Visit (HOSPITAL_BASED_OUTPATIENT_CLINIC_OR_DEPARTMENT_OTHER): Payer: BLUE CROSS/BLUE SHIELD

## 2014-10-17 VITALS — BP 116/72 | HR 69 | Temp 98.3°F | Resp 16

## 2014-10-17 DIAGNOSIS — C3491 Malignant neoplasm of unspecified part of right bronchus or lung: Secondary | ICD-10-CM

## 2014-10-17 DIAGNOSIS — C7B8 Other secondary neuroendocrine tumors: Secondary | ICD-10-CM

## 2014-10-17 DIAGNOSIS — C7A1 Malignant poorly differentiated neuroendocrine tumors: Secondary | ICD-10-CM

## 2014-10-17 DIAGNOSIS — Z5111 Encounter for antineoplastic chemotherapy: Secondary | ICD-10-CM

## 2014-10-17 MED ORDER — SODIUM CHLORIDE 0.9 % IV SOLN
100.0000 mg/m2 | Freq: Once | INTRAVENOUS | Status: AC
  Administered 2014-10-17: 150 mg via INTRAVENOUS
  Filled 2014-10-17: qty 7.5

## 2014-10-17 MED ORDER — PROCHLORPERAZINE MALEATE 10 MG PO TABS
ORAL_TABLET | ORAL | Status: AC
Start: 2014-10-17 — End: 2014-10-17
  Filled 2014-10-17: qty 1

## 2014-10-17 MED ORDER — SODIUM CHLORIDE 0.9 % IV SOLN
Freq: Once | INTRAVENOUS | Status: AC
  Administered 2014-10-17: 09:00:00 via INTRAVENOUS

## 2014-10-17 MED ORDER — PROCHLORPERAZINE MALEATE 10 MG PO TABS
10.0000 mg | ORAL_TABLET | Freq: Once | ORAL | Status: AC
  Administered 2014-10-17: 10 mg via ORAL

## 2014-10-17 MED ORDER — SODIUM CHLORIDE 0.9 % IJ SOLN
10.0000 mL | INTRAMUSCULAR | Status: DC | PRN
  Administered 2014-10-17: 10 mL
  Filled 2014-10-17: qty 10

## 2014-10-17 MED ORDER — HEPARIN SOD (PORK) LOCK FLUSH 100 UNIT/ML IV SOLN
500.0000 [IU] | Freq: Once | INTRAVENOUS | Status: AC | PRN
  Administered 2014-10-17: 500 [IU]
  Filled 2014-10-17: qty 5

## 2014-10-17 NOTE — Patient Instructions (Signed)
[facility] Discharge Instructions for Patients Receiving Chemotherapy  Today you received the following chemotherapy agents: Etoposide   To help prevent nausea and vomiting after your treatment, we encourage you to take your nausea medication as directed.    If you develop nausea and vomiting that is not controlled by your nausea medication, call the clinic.   BELOW ARE SYMPTOMS THAT SHOULD BE REPORTED IMMEDIATELY:  *FEVER GREATER THAN 100.5 F  *CHILLS WITH OR WITHOUT FEVER  NAUSEA AND VOMITING THAT IS NOT CONTROLLED WITH YOUR NAUSEA MEDICATION  *UNUSUAL SHORTNESS OF BREATH  *UNUSUAL BRUISING OR BLEEDING  TENDERNESS IN MOUTH AND THROAT WITH OR WITHOUT PRESENCE OF ULCERS  *URINARY PROBLEMS  *BOWEL PROBLEMS  UNUSUAL RASH Items with * indicate a potential emergency and should be followed up as soon as possible.  Feel free to call the clinic you have any questions or concerns. The clinic phone number is [phone].  Please show the CHEMO ALERT CARD at check-in to the Emergency Department and triage nurse.   

## 2014-10-18 ENCOUNTER — Ambulatory Visit (HOSPITAL_BASED_OUTPATIENT_CLINIC_OR_DEPARTMENT_OTHER): Payer: BLUE CROSS/BLUE SHIELD

## 2014-10-18 ENCOUNTER — Telehealth: Payer: Self-pay | Admitting: Internal Medicine

## 2014-10-18 VITALS — BP 124/77 | HR 76 | Temp 97.7°F | Resp 18

## 2014-10-18 DIAGNOSIS — C7B8 Other secondary neuroendocrine tumors: Secondary | ICD-10-CM | POA: Diagnosis not present

## 2014-10-18 DIAGNOSIS — Z5111 Encounter for antineoplastic chemotherapy: Secondary | ICD-10-CM

## 2014-10-18 DIAGNOSIS — C7A1 Malignant poorly differentiated neuroendocrine tumors: Secondary | ICD-10-CM

## 2014-10-18 DIAGNOSIS — C3491 Malignant neoplasm of unspecified part of right bronchus or lung: Secondary | ICD-10-CM

## 2014-10-18 MED ORDER — SODIUM CHLORIDE 0.9 % IJ SOLN
10.0000 mL | INTRAMUSCULAR | Status: DC | PRN
  Administered 2014-10-18: 10 mL
  Filled 2014-10-18: qty 10

## 2014-10-18 MED ORDER — HEPARIN SOD (PORK) LOCK FLUSH 100 UNIT/ML IV SOLN
500.0000 [IU] | Freq: Once | INTRAVENOUS | Status: AC | PRN
  Administered 2014-10-18: 500 [IU]
  Filled 2014-10-18: qty 5

## 2014-10-18 MED ORDER — SODIUM CHLORIDE 0.9 % IV SOLN
Freq: Once | INTRAVENOUS | Status: AC
  Administered 2014-10-18: 08:00:00 via INTRAVENOUS

## 2014-10-18 MED ORDER — SODIUM CHLORIDE 0.9 % IV SOLN
100.0000 mg/m2 | Freq: Once | INTRAVENOUS | Status: AC
  Administered 2014-10-18: 150 mg via INTRAVENOUS
  Filled 2014-10-18: qty 7.5

## 2014-10-18 MED ORDER — PROCHLORPERAZINE MALEATE 10 MG PO TABS
ORAL_TABLET | ORAL | Status: AC
  Filled 2014-10-18: qty 1

## 2014-10-18 MED ORDER — PROCHLORPERAZINE MALEATE 10 MG PO TABS
10.0000 mg | ORAL_TABLET | Freq: Once | ORAL | Status: AC
  Administered 2014-10-18: 10 mg via ORAL

## 2014-10-18 NOTE — Patient Instructions (Signed)
[facility] Discharge Instructions for Patients Receiving Chemotherapy  Today you received the following chemotherapy agents VP 16 (Etoposide) To help prevent nausea and vomiting after your treatment, we encourage you to take your nausea medication as prescribed.   If you develop nausea and vomiting that is not controlled by your nausea medication, call the clinic.   BELOW ARE SYMPTOMS THAT SHOULD BE REPORTED IMMEDIATELY:  *FEVER GREATER THAN 100.5 F  *CHILLS WITH OR WITHOUT FEVER  NAUSEA AND VOMITING THAT IS NOT CONTROLLED WITH YOUR NAUSEA MEDICATION  *UNUSUAL SHORTNESS OF BREATH  *UNUSUAL BRUISING OR BLEEDING  TENDERNESS IN MOUTH AND THROAT WITH OR WITHOUT PRESENCE OF ULCERS  *URINARY PROBLEMS  *BOWEL PROBLEMS  UNUSUAL RASH Items with * indicate a potential emergency and should be followed up as soon as possible.  Feel free to call the clinic you have any questions or concerns. The clinic phone number is [phone].  Please show the [place] at check-in to the Emergency Department and triage nurse.

## 2014-10-18 NOTE — Telephone Encounter (Signed)
Changed time of 8/29 lab/flush yesterday. Patient was made aware and wife stopped by for new schedule this morning.

## 2014-10-20 ENCOUNTER — Ambulatory Visit (HOSPITAL_BASED_OUTPATIENT_CLINIC_OR_DEPARTMENT_OTHER): Payer: BLUE CROSS/BLUE SHIELD

## 2014-10-20 VITALS — BP 105/82 | HR 85 | Temp 97.9°F

## 2014-10-20 DIAGNOSIS — C7A1 Malignant poorly differentiated neuroendocrine tumors: Secondary | ICD-10-CM | POA: Diagnosis not present

## 2014-10-20 DIAGNOSIS — Z5189 Encounter for other specified aftercare: Secondary | ICD-10-CM

## 2014-10-20 DIAGNOSIS — C3491 Malignant neoplasm of unspecified part of right bronchus or lung: Secondary | ICD-10-CM

## 2014-10-20 DIAGNOSIS — C7B8 Other secondary neuroendocrine tumors: Secondary | ICD-10-CM | POA: Diagnosis not present

## 2014-10-20 MED ORDER — PEGFILGRASTIM INJECTION 6 MG/0.6ML ~~LOC~~
6.0000 mg | PREFILLED_SYRINGE | Freq: Once | SUBCUTANEOUS | Status: AC
  Administered 2014-10-20: 6 mg via SUBCUTANEOUS
  Filled 2014-10-20: qty 0.6

## 2014-10-23 ENCOUNTER — Other Ambulatory Visit: Payer: Self-pay | Admitting: Internal Medicine

## 2014-10-23 ENCOUNTER — Other Ambulatory Visit (HOSPITAL_BASED_OUTPATIENT_CLINIC_OR_DEPARTMENT_OTHER): Payer: BLUE CROSS/BLUE SHIELD

## 2014-10-23 ENCOUNTER — Ambulatory Visit (HOSPITAL_BASED_OUTPATIENT_CLINIC_OR_DEPARTMENT_OTHER): Payer: BLUE CROSS/BLUE SHIELD

## 2014-10-23 VITALS — BP 101/68 | HR 74 | Temp 97.8°F

## 2014-10-23 DIAGNOSIS — C3490 Malignant neoplasm of unspecified part of unspecified bronchus or lung: Secondary | ICD-10-CM

## 2014-10-23 DIAGNOSIS — C7B8 Other secondary neuroendocrine tumors: Secondary | ICD-10-CM | POA: Diagnosis not present

## 2014-10-23 DIAGNOSIS — C7A1 Malignant poorly differentiated neuroendocrine tumors: Secondary | ICD-10-CM | POA: Diagnosis not present

## 2014-10-23 DIAGNOSIS — Z452 Encounter for adjustment and management of vascular access device: Secondary | ICD-10-CM

## 2014-10-23 DIAGNOSIS — Z95828 Presence of other vascular implants and grafts: Secondary | ICD-10-CM

## 2014-10-23 LAB — CBC WITH DIFFERENTIAL/PLATELET
BASO%: 0.3 % (ref 0.0–2.0)
BASOS ABS: 0 10*3/uL (ref 0.0–0.1)
EOS ABS: 0 10*3/uL (ref 0.0–0.5)
EOS%: 0.1 % (ref 0.0–7.0)
HEMATOCRIT: 28.4 % — AB (ref 38.4–49.9)
HEMOGLOBIN: 9.6 g/dL — AB (ref 13.0–17.1)
LYMPH#: 0.4 10*3/uL — AB (ref 0.9–3.3)
LYMPH%: 2.9 % — ABNORMAL LOW (ref 14.0–49.0)
MCH: 31.4 pg (ref 27.2–33.4)
MCHC: 33.9 g/dL (ref 32.0–36.0)
MCV: 92.6 fL (ref 79.3–98.0)
MONO#: 0.3 10*3/uL (ref 0.1–0.9)
MONO%: 2.2 % (ref 0.0–14.0)
NEUT%: 94.5 % — ABNORMAL HIGH (ref 39.0–75.0)
NEUTROS ABS: 12.7 10*3/uL — AB (ref 1.5–6.5)
PLATELETS: 86 10*3/uL — AB (ref 140–400)
RBC: 3.06 10*6/uL — ABNORMAL LOW (ref 4.20–5.82)
RDW: 12.4 % (ref 11.0–14.6)
WBC: 13.4 10*3/uL — AB (ref 4.0–10.3)

## 2014-10-23 LAB — COMPREHENSIVE METABOLIC PANEL (CC13)
ALT: 9 U/L (ref 0–55)
AST: 17 U/L (ref 5–34)
Albumin: 3.2 g/dL — ABNORMAL LOW (ref 3.5–5.0)
Alkaline Phosphatase: 143 U/L (ref 40–150)
Anion Gap: 6 mEq/L (ref 3–11)
BILIRUBIN TOTAL: 0.78 mg/dL (ref 0.20–1.20)
BUN: 14.2 mg/dL (ref 7.0–26.0)
CO2: 28 meq/L (ref 22–29)
CREATININE: 0.7 mg/dL (ref 0.7–1.3)
Calcium: 9.2 mg/dL (ref 8.4–10.4)
Chloride: 103 mEq/L (ref 98–109)
EGFR: >90 mL/min/{1.73_m2} (ref >90)
GLUCOSE: 103 mg/dL (ref 70–140)
Potassium: 4.1 mEq/L (ref 3.5–5.1)
SODIUM: 138 meq/L (ref 136–145)
TOTAL PROTEIN: 6.5 g/dL (ref 6.4–8.3)

## 2014-10-23 MED ORDER — HEPARIN SOD (PORK) LOCK FLUSH 100 UNIT/ML IV SOLN
500.0000 [IU] | Freq: Once | INTRAVENOUS | Status: AC
  Administered 2014-10-23: 500 [IU] via INTRAVENOUS
  Filled 2014-10-23: qty 5

## 2014-10-23 MED ORDER — SODIUM CHLORIDE 0.9 % IJ SOLN
10.0000 mL | INTRAMUSCULAR | Status: DC | PRN
Start: 2014-10-23 — End: 2014-10-23
  Administered 2014-10-23: 10 mL via INTRAVENOUS
  Filled 2014-10-23: qty 10

## 2014-10-23 NOTE — Patient Instructions (Signed)

## 2014-10-31 ENCOUNTER — Other Ambulatory Visit: Payer: BLUE CROSS/BLUE SHIELD

## 2014-10-31 ENCOUNTER — Other Ambulatory Visit (HOSPITAL_BASED_OUTPATIENT_CLINIC_OR_DEPARTMENT_OTHER): Payer: BLUE CROSS/BLUE SHIELD

## 2014-10-31 ENCOUNTER — Ambulatory Visit (HOSPITAL_BASED_OUTPATIENT_CLINIC_OR_DEPARTMENT_OTHER): Payer: BLUE CROSS/BLUE SHIELD | Admitting: Nurse Practitioner

## 2014-10-31 ENCOUNTER — Ambulatory Visit (HOSPITAL_COMMUNITY)
Admission: RE | Admit: 2014-10-31 | Discharge: 2014-10-31 | Disposition: A | Discharge status: Home / Self Care | Payer: BLUE CROSS/BLUE SHIELD | Source: Ambulatory Visit | Attending: Internal Medicine | Admitting: Internal Medicine

## 2014-10-31 ENCOUNTER — Ambulatory Visit: Payer: BLUE CROSS/BLUE SHIELD

## 2014-10-31 ENCOUNTER — Ambulatory Visit (HOSPITAL_BASED_OUTPATIENT_CLINIC_OR_DEPARTMENT_OTHER): Payer: BLUE CROSS/BLUE SHIELD

## 2014-10-31 VITALS — BP 101/71 | HR 69 | Temp 98.0°F | Resp 18

## 2014-10-31 VITALS — BP 114/79 | HR 131 | Temp 97.8°F | Resp 16 | Wt 109.8 lb

## 2014-10-31 DIAGNOSIS — M545 Low back pain, unspecified: Secondary | ICD-10-CM

## 2014-10-31 DIAGNOSIS — T451X5A Adverse effect of antineoplastic and immunosuppressive drugs, initial encounter: Principal | ICD-10-CM

## 2014-10-31 DIAGNOSIS — D6481 Anemia due to antineoplastic chemotherapy: Secondary | ICD-10-CM | POA: Insufficient documentation

## 2014-10-31 DIAGNOSIS — J40 Bronchitis, not specified as acute or chronic: Secondary | ICD-10-CM

## 2014-10-31 DIAGNOSIS — C7A1 Malignant poorly differentiated neuroendocrine tumors: Secondary | ICD-10-CM

## 2014-10-31 DIAGNOSIS — D61810 Antineoplastic chemotherapy induced pancytopenia: Secondary | ICD-10-CM

## 2014-10-31 DIAGNOSIS — C3490 Malignant neoplasm of unspecified part of unspecified bronchus or lung: Secondary | ICD-10-CM

## 2014-10-31 DIAGNOSIS — D696 Thrombocytopenia, unspecified: Secondary | ICD-10-CM | POA: Insufficient documentation

## 2014-10-31 DIAGNOSIS — K1230 Oral mucositis (ulcerative), unspecified: Secondary | ICD-10-CM

## 2014-10-31 DIAGNOSIS — Z95828 Presence of other vascular implants and grafts: Secondary | ICD-10-CM

## 2014-10-31 DIAGNOSIS — Z7901 Long term (current) use of anticoagulants: Secondary | ICD-10-CM

## 2014-10-31 DIAGNOSIS — R918 Other nonspecific abnormal finding of lung field: Secondary | ICD-10-CM

## 2014-10-31 LAB — COMPREHENSIVE METABOLIC PANEL (CC13)
ALBUMIN: 3.3 g/dL — AB (ref 3.5–5.0)
ALK PHOS: 116 U/L (ref 40–150)
ALT: 14 U/L (ref 0–55)
ANION GAP: 8 meq/L (ref 3–11)
AST: 15 U/L (ref 5–34)
BILIRUBIN TOTAL: 0.24 mg/dL (ref 0.20–1.20)
BUN: 11 mg/dL (ref 7.0–26.0)
CALCIUM: 8.9 mg/dL (ref 8.4–10.4)
CO2: 31 mEq/L — ABNORMAL HIGH (ref 22–29)
Chloride: 102 mEq/L (ref 98–109)
Creatinine: 0.9 mg/dL (ref 0.7–1.3)
GLUCOSE: 123 mg/dL (ref 70–140)
POTASSIUM: 3.9 meq/L (ref 3.5–5.1)
SODIUM: 140 meq/L (ref 136–145)
TOTAL PROTEIN: 6.5 g/dL (ref 6.4–8.3)

## 2014-10-31 LAB — CBC WITH DIFFERENTIAL/PLATELET
BASO%: 1 % (ref 0.0–2.0)
BASOS ABS: 0.1 10*3/uL (ref 0.0–0.1)
EOS ABS: 0 10*3/uL (ref 0.0–0.5)
EOS%: 0.2 % (ref 0.0–7.0)
HEMATOCRIT: 22.3 % — AB (ref 38.4–49.9)
HGB: 7.8 g/dL — ABNORMAL LOW (ref 13.0–17.1)
LYMPH#: 0.7 10*3/uL — AB (ref 0.9–3.3)
LYMPH%: 9.8 % — ABNORMAL LOW (ref 14.0–49.0)
MCH: 31.7 pg (ref 27.2–33.4)
MCHC: 35.2 g/dL (ref 32.0–36.0)
MCV: 90.1 fL (ref 79.3–98.0)
MONO#: 0.9 10*3/uL (ref 0.1–0.9)
MONO%: 11.7 % (ref 0.0–14.0)
NEUT#: 5.9 10*3/uL (ref 1.5–6.5)
NEUT%: 77.3 % — AB (ref 39.0–75.0)
PLATELETS: 7 10*3/uL — AB (ref 140–400)
RBC: 2.47 10*6/uL — ABNORMAL LOW (ref 4.20–5.82)
RDW: 12.3 % (ref 11.0–14.6)
WBC: 7.6 10*3/uL (ref 4.0–10.3)

## 2014-10-31 LAB — PREPARE RBC (CROSSMATCH)

## 2014-10-31 MED ORDER — SODIUM CHLORIDE 0.9 % IV SOLN
250.0000 mL | Freq: Once | INTRAVENOUS | Status: DC
  Filled 2014-10-31: qty 250

## 2014-10-31 MED ORDER — SODIUM CHLORIDE 0.9 % IJ SOLN
10.0000 mL | INTRAMUSCULAR | Status: DC | PRN
  Administered 2014-10-31: 10 mL via INTRAVENOUS
  Filled 2014-10-31: qty 10

## 2014-10-31 MED ORDER — SODIUM CHLORIDE 0.9 % IJ SOLN
10.0000 mL | INTRAMUSCULAR | Status: AC | PRN
  Administered 2014-10-31: 10 mL
  Filled 2014-10-31: qty 10

## 2014-10-31 MED ORDER — ACETAMINOPHEN 325 MG PO TABS
650.0000 mg | ORAL_TABLET | Freq: Once | ORAL | Status: DC
  Filled 2014-10-31: qty 2

## 2014-10-31 MED ORDER — HEPARIN SOD (PORK) LOCK FLUSH 100 UNIT/ML IV SOLN
500.0000 [IU] | Freq: Once | INTRAVENOUS | Status: AC
  Administered 2014-10-31: 500 [IU] via INTRAVENOUS
  Filled 2014-10-31: qty 5

## 2014-10-31 MED ORDER — ACETAMINOPHEN 325 MG PO TABS
ORAL_TABLET | ORAL | Status: AC
  Filled 2014-10-31: qty 2

## 2014-10-31 MED ORDER — DIPHENHYDRAMINE HCL 25 MG PO CAPS
25.0000 mg | ORAL_CAPSULE | Freq: Once | ORAL | Status: AC
  Administered 2014-10-31: 25 mg via ORAL

## 2014-10-31 MED ORDER — DIPHENHYDRAMINE HCL 25 MG PO CAPS
ORAL_CAPSULE | ORAL | Status: AC
  Filled 2014-10-31: qty 1

## 2014-10-31 MED ORDER — ACETAMINOPHEN 325 MG PO TABS
650.0000 mg | ORAL_TABLET | Freq: Once | ORAL | Status: AC
  Administered 2014-10-31: 650 mg via ORAL

## 2014-10-31 MED ORDER — SODIUM CHLORIDE 0.9 % IV SOLN
250.0000 mL | Freq: Once | INTRAVENOUS | Status: AC
  Administered 2014-10-31: 250 mL via INTRAVENOUS

## 2014-10-31 MED ORDER — ALBUTEROL SULFATE (2.5 MG/3ML) 0.083% IN NEBU
2.5000 mg | INHALATION_SOLUTION | Freq: Once | RESPIRATORY_TRACT | Status: AC
  Administered 2014-10-31: 2.5 mg via RESPIRATORY_TRACT
  Filled 2014-10-31: qty 3

## 2014-10-31 MED ORDER — HEPARIN SOD (PORK) LOCK FLUSH 100 UNIT/ML IV SOLN
500.0000 [IU] | Freq: Every day | INTRAVENOUS | Status: AC | PRN
  Administered 2014-10-31: 500 [IU]
  Filled 2014-10-31: qty 5

## 2014-10-31 MED ORDER — ALBUTEROL SULFATE (2.5 MG/3ML) 0.083% IN NEBU
INHALATION_SOLUTION | RESPIRATORY_TRACT | Status: AC
  Filled 2014-10-31: qty 3

## 2014-10-31 MED ORDER — DIPHENHYDRAMINE HCL 25 MG PO CAPS
25.0000 mg | ORAL_CAPSULE | Freq: Once | ORAL | Status: DC
  Filled 2014-10-31: qty 1

## 2014-10-31 MED ORDER — LEVOFLOXACIN 500 MG PO TABS: 500.0000 mg | ORAL_TABLET | Freq: Every day | ORAL | Status: DC

## 2014-10-31 NOTE — Patient Instructions (Signed)
Platelet Transfusion Information This is information about transfusions of platelets. Platelets are tiny cells made by the bone marrow and found in the blood. When a blood vessel is damaged, platelets rush to the damaged area to help form a clot. This begins the healing process. When platelets get very low, your blood may have trouble clotting. This may be from:  Illness.  Blood disorder.  Chemotherapy to treat cancer. Often, lower platelet counts do not cause problems.  Platelets usually last for 7 to 10 days. If they are not used in an injury, they are broken down by the liver or spleen. Symptoms of low platelet count include:  Nosebleeds.  Bleeding gums.  Heavy periods.  Bruising and tiny blood spots in the skin.  Pinpoint spots of bleeding (petechiae).  Larger bruises (purpura).  Bleeding can be more serious if it happens in the brain or bowel. Platelet transfusions are often used to keep the platelet count at an acceptable level. Serious bleeding due to low platelets is uncommon. RISKS AND COMPLICATIONS Severe side effects from platelet transfusions are uncommon. Minor reactions may include:  Itching.  Rashes.  High temperature and shivering. Medications are available to stop transfusion reactions. Let your health care provider know if you develop any of the above problems.  If you are having platelet transfusions frequently, they may get less effective. This is called becoming refractory to platelets. It is uncommon. This can happen from non-immune causes and immune causes. Non-immune causes include:  High temperatures.  Some medications.  An enlarged spleen. Immune causes happen when your body discovers the platelets are not your own and begins making antibodies against them. The antibodies kill the platelets quickly. Even with platelet transfusions, you may still notice problems with bleeding or bruising. Let your health care providers know about this. Other things  can be done to help if this happens.  BEFORE THE PROCEDURE   Your health care provider will check your platelet count regularly.  If the platelet count is too low, it may be necessary to have a platelet transfusion.  This is more important before certain procedures with a risk of bleeding, such as a spinal tap.  Platelet transfusion reduces the risk of bleeding during or after the procedure.  Except in emergencies, giving a transfusion requires a written consent. Before blood is taken from a donor, a complete history is taken to make sure the person has no history of previous diseases, nor engages in risky social behavior. Examples of this are intravenous drug use or sexual activity with multiple partners. This could lead to infected blood or blood products being used. This history is taken in spite of the extensive testing to make sure the blood is safe. All blood products transfused are tested to make sure it is a match for the person getting the blood. It is also checked for infections. Blood is the safest it has ever been. The risk of getting an infection is very low. PROCEDURE  The platelets are stored in small plastic bags that are kept at a low temperature.  Each bag is called a unit and sometimes two units are given. They are given through an intravenous line by drip infusion over about one-half hour.  Usually blood is collected from multiple people to get enough to transfuse.  Sometimes, the platelets are collected from a single person. This is done using a special machine that separates the platelets from the blood. The machine is called an apheresis machine. Platelets collected in this   way are called apheresed platelets. Apheresed platelets reduce the risk of becoming sensitive to the platelets. This lowers the chances of having a transfusion reaction.  As it only takes a short time to give the platelets, this treatment can be given in an outpatient department. Platelets can also be  given before or after other treatments. SEEK IMMEDIATE MEDICAL CARE IF: You have any of the following symptoms over the next 12 hours or several days:  Shaking chills.  Fever with a temperature greater than 102F (38.9C) develops.  Back pain or muscle pain.  People around you feel you are not acting correctly, or you are confused.  Blood in the urine or bowel movements, or bleeding from any place in your body.  Shortness of breath, or difficulty breathing.  Dizziness.  Fainting.  You break out in a rash or develop hives.  Decrease in the amount of urine you are putting out, or the urine turns a dark color or changes to pink, red, or brown.  A severe headache or stiff neck.  Bruising more easily. Document Released: 12/08/2006 Document Revised: 06/27/2013 Document Reviewed: 12/08/2006 ExitCare Patient Information 2015 [employer]. This information is not intended to replace advice given to you by your health care provider. Make sure you discuss any questions you have with your health care provider.   Blood Transfusion Information WHAT IS A BLOOD TRANSFUSION? A transfusion is the replacement of blood or some of its parts. Blood is made up of multiple cells which provide different functions.  Red blood cells carry oxygen and are used for blood loss replacement.  White blood cells fight against infection.  Platelets control bleeding.  Plasma helps clot blood.  Other blood products are available for specialized needs, such as hemophilia or other clotting disorders. BEFORE THE TRANSFUSION  Who gives blood for transfusions?   You may be able to donate blood to be used at a later date on yourself (autologous donation).  Relatives can be asked to donate blood. This is generally not any safer than if you have received blood from a stranger. The same precautions are taken to ensure safety when a relative's blood is donated.  Healthy volunteers who are fully evaluated to  make sure their blood is safe. This is blood bank blood. Transfusion therapy is the safest it has ever been in the practice of medicine. Before blood is taken from a donor, a complete history is taken to make sure that person has no history of diseases nor engages in risky social behavior (examples are intravenous drug use or sexual activity with multiple partners). The donor's travel history is screened to minimize risk of transmitting infections, such as malaria. The donated blood is tested for signs of infectious diseases, such as HIV and hepatitis. The blood is then tested to be sure it is compatible with you in order to minimize the chance of a transfusion reaction. If you or a relative donates blood, this is often done in anticipation of surgery and is not appropriate for emergency situations. It takes many days to process the donated blood. RISKS AND COMPLICATIONS Although transfusion therapy is very safe and saves many lives, the main dangers of transfusion include:   Getting an infectious disease.  Developing a transfusion reaction. This is an allergic reaction to something in the blood you were given. Every precaution is taken to prevent this. The decision to have a blood transfusion has been considered carefully by your caregiver before blood is given. Blood is not given   unless the benefits outweigh the risks. AFTER THE TRANSFUSION  Right after receiving a blood transfusion, you will usually feel much better and more energetic. This is especially true if your red blood cells have gotten low (anemic). The transfusion raises the level of the red blood cells which carry oxygen, and this usually causes an energy increase.  The nurse administering the transfusion will monitor you carefully for complications. HOME CARE INSTRUCTIONS  No special instructions are needed after a transfusion. You may find your energy is better. Speak with your caregiver about any limitations on activity for underlying  diseases you may have. SEEK MEDICAL CARE IF:   Your condition is not improving after your transfusion.  You develop redness or irritation at the intravenous (IV) site. SEEK IMMEDIATE MEDICAL CARE IF:  Any of the following symptoms occur over the next 12 hours:  Shaking chills.  You have a temperature by mouth above 102 F (38.9 C), not controlled by medicine.  Chest, back, or muscle pain.  People around you feel you are not acting correctly or are confused.  Shortness of breath or difficulty breathing.  Dizziness and fainting.  You get a rash or develop hives.  You have a decrease in urine output.  Your urine turns a dark color or changes to pink, red, or brown. Any of the following symptoms occur over the next 10 days:  You have a temperature by mouth above 102 F (38.9 C), not controlled by medicine.  Shortness of breath.  Weakness after normal activity.  The white part of the eye turns yellow (jaundice).  You have a decrease in the amount of urine or are urinating less often.  Your urine turns a dark color or changes to pink, red, or brown. Document Released: 02/08/2000 Document Revised: 05/05/2011 Document Reviewed: 09/27/2007 ExitCare Patient Information 2015 [employer]. This information is not intended to replace advice given to you by your health care provider. Make sure you discuss any questions you have with your health care provider.  

## 2014-10-31 NOTE — Patient Instructions (Signed)

## 2014-11-01 ENCOUNTER — Telehealth: Payer: Self-pay | Admitting: *Deleted

## 2014-11-01 ENCOUNTER — Other Ambulatory Visit: Payer: Self-pay | Admitting: *Deleted

## 2014-11-01 ENCOUNTER — Encounter: Payer: Self-pay | Admitting: Nurse Practitioner

## 2014-11-01 DIAGNOSIS — Z7901 Long term (current) use of anticoagulants: Secondary | ICD-10-CM | POA: Insufficient documentation

## 2014-11-01 DIAGNOSIS — J40 Bronchitis, not specified as acute or chronic: Secondary | ICD-10-CM | POA: Insufficient documentation

## 2014-11-01 DIAGNOSIS — T451X5A Adverse effect of antineoplastic and immunosuppressive drugs, initial encounter: Secondary | ICD-10-CM

## 2014-11-01 DIAGNOSIS — D61810 Antineoplastic chemotherapy induced pancytopenia: Secondary | ICD-10-CM | POA: Insufficient documentation

## 2014-11-01 LAB — TYPE AND SCREEN
ABO/RH(D): A POS
Antibody Screen: NEGATIVE
UNIT DIVISION: 0

## 2014-11-01 LAB — PREPARE PLATELET PHERESIS: Unit division: 0

## 2014-11-01 MED ORDER — ALBUTEROL SULFATE HFA 108 (90 BASE) MCG/ACT IN AERS: 2.0000 | INHALATION_SPRAY | Freq: Four times a day (QID) | RESPIRATORY_TRACT | Status: DC | PRN

## 2014-11-01 NOTE — Telephone Encounter (Signed)
Collaborative nurse has filled this refill.

## 2014-11-01 NOTE — Telephone Encounter (Signed)
Called pt l[facility] Albuterol inhaler refill sent to pt pharmacy. MD will refill x 1; however going forward all refills must be from PCP.

## 2014-11-01 NOTE — Assessment & Plan Note (Signed)
Patient received cycle 3, day 1 of his carboplatin/etoposide chemotherapy on 10/16/2014.  Blood counts obtained today reveal a WBC of 7.6, ANC 5.9, hemoglobin 7.8, and platelet count 7.  Patient is complaining of some bronchitis-like symptoms today.  He also reports some mild shortness of breath with exertion and increased fatigue.  He denies any recent fevers or chills.  The plan is for the patient to receive 1 unit packed red blood cell transfusion and 1 unit of platelets while at the infusion center today.  Also, patient continues with home O2 on an as-needed basis.

## 2014-11-01 NOTE — Progress Notes (Signed)
SYMPTOM MANAGEMENT CLINIC   HPI: Ricky Villanueva 64 y.o. male diagnosed with lung cancer.  Currently undergoing carboplatin/etoposide chemotherapy regimen.  Patient is complaining of some bronchitis-like symptoms today.  He also reports some mild shortness of breath with exertion and increased fatigue.  He denies any recent fevers or chills.  HPI  ROS  Past Medical History  Diagnosis Date  . Hyperlipidemia   . Hypertension   . GERD (gastroesophageal reflux disease)   . Respiratory failure with hypoxia 04/21/2013    secondary to pneumonia/notes 04/21/2013  . Pulmonary embolism     "got one in there now" (04/21/2013)  . COPD (chronic obstructive pulmonary disease)   . Pneumonia 2/?01/2014    "wouldn't get better; hospitalized 04/21/2013)  . Arthritis     "minor in my right hand" (04/21/2013)  . History of radiation therapy 05/03/13-05/18/13    chest & whole brain  . Cancer     lung ca dx'd 03/2013  . Colon polyps     hyperplastic  . Right arm pain 08/21/2014    Past Surgical History  Procedure Laterality Date  . Inguinal hernia repair Right ~ 2005  . Finger surgery Left ~ 1989    "crushed end of my middle finger off"   . Video bronchoscopy Bilateral 04/26/2013    Procedure: VIDEO BRONCHOSCOPY WITH FLUORO;  Surgeon: Wilhelmina Mcardle, MD;  Location: [facility] ENDOSCOPY;  Service: Cardiopulmonary;  Laterality: Bilateral;    has GERD (gastroesophageal reflux disease); HCAP (healthcare-associated pneumonia); Respiratory failure with hypoxia; HTN (hypertension); Acute pulmonary embolism; Lung mass; Emphysema lung; Small cell carcinoma of lung; Acute renal failure; Protein-calorie malnutrition, severe; Anorexia; Radiation pneumonitis; Right arm pain; Long term current use of anticoagulant therapy; Antineoplastic chemotherapy induced pancytopenia; and Bronchitis on his problem list.    is allergic to decadron and augmentin.    Medication List       This list is accurate as of: 10/31/14 11:59  PM.  Always use your most recent med list.               albuterol 108 (90 BASE) MCG/ACT inhaler  Commonly known as:  PROVENTIL HFA;VENTOLIN HFA  Inhale 2 puffs into the lungs every 6 (six) hours as needed for wheezing or shortness of breath.     chlorpheniramine-HYDROcodone 10-8 MG/5ML Suer  Commonly known as:  TUSSIONEX PENNKINETIC ER  Take 5 mLs by mouth every 12 (twelve) hours as needed for cough.     dextromethorphan 15 MG/5ML syrup  Take 10 mLs by mouth as needed for cough.     docusate sodium 100 MG capsule  Commonly known as:  COLACE  Take 300 mg by mouth 2 (two) times daily as needed for mild constipation.     GERITOL PO  Take 1 tablet by mouth daily.     levofloxacin 500 MG tablet  Commonly known as:  LEVAQUIN  Take 1 tablet (500 mg total) by mouth daily.     lidocaine-prilocaine cream  Commonly known as:  EMLA  Apply to port site one hour before treatment and cover with plastic wrap.     LORazepam 0.5 MG tablet  Commonly known as:  ATIVAN  Take 1 tablet (0.5 mg total) by mouth 2 (two) times daily. for anxiety     magnesium hydroxide 400 MG/5ML suspension  Commonly known as:  MILK OF MAGNESIA  Take 5 mLs by mouth daily as needed for mild constipation.     mirtazapine 30 MG tablet  Commonly known  as:  REMERON  Take 1 tablet (30 mg total) by mouth at bedtime.     morphine 30 MG 12 hr tablet  Commonly known as:  MS CONTIN  Take 1 tablet (30 mg total) by mouth every 12 (twelve) hours.     omeprazole 40 MG capsule  Commonly known as:  PRILOSEC  Take 40 mg by mouth daily.     ondansetron 8 MG tablet  Commonly known as:  ZOFRAN  Take 1 tablet (8 mg total) by mouth every 8 (eight) hours as needed for nausea or vomiting (nausea).     oxyCODONE 5 MG immediate release tablet  Commonly known as:  Oxy IR/ROXICODONE  Take 1 tablet (5 mg total) by mouth every 6 (six) hours as needed for severe pain.     polyethylene glycol powder powder  Commonly known as:   MIRALAX  Take 17 g by mouth daily.     prochlorperazine 10 MG tablet  Commonly known as:  COMPAZINE  Take 1 tablet (10 mg total) by mouth every 6 (six) hours as needed for nausea or vomiting (nausea).     tiotropium 18 MCG inhalation capsule  Commonly known as:  SPIRIVA HANDIHALER  Place 1 capsule (18 mcg total) into inhaler and inhale daily.     vitamin B-12 100 MCG tablet  Commonly known as:  CYANOCOBALAMIN  Take 100 mcg by mouth daily.     XARELTO 20 MG Tabs tablet  Generic drug:  rivaroxaban  TAKE 1 TABLET (20 MG TOTAL) BY MOUTH DAILY WITH SUPPER.         PHYSICAL EXAMINATION  Oncology Vitals 10/31/2014 10/31/2014 10/31/2014 10/31/2014 10/31/2014 10/31/2014 10/23/2014  Height - - - - - - -  Weight - - - - - 49.805 kg -  Weight (lbs) - - - - - 109 lbs 13 oz -  BMI (kg/m2) - - - - - - -  Temp 98 98.2 98.2 98 98 97.8 97.8  Pulse 69 76 84 90 87 131 74  Resp '18 18 18 18 18 16 ' -  SpO2 - 96 95 97 96 (No Data) -  BSA (m2) - - - - - - -   BP Readings from Last 3 Encounters:  10/31/14 101/71  10/31/14 114/79  10/23/14 101/68    Physical Exam  Constitutional: He is oriented to person, place, and time. Vital signs are normal. He appears malnourished. He appears unhealthy. He appears cachectic.  Patient appears fatigued, weak, frail, and chronically ill.  HENT:  Head: Normocephalic and atraumatic.  Mouth/Throat: Oropharynx is clear and moist.  Eyes: Conjunctivae and EOM are normal. Pupils are equal, round, and reactive to light. Right eye exhibits no discharge. Left eye exhibits no discharge. No scleral icterus.  Neck: Normal range of motion. Neck supple. No JVD present. No tracheal deviation present. No thyromegaly present.  Cardiovascular: Normal rate, regular rhythm, normal heart sounds and intact distal pulses.   Pulmonary/Chest: Effort normal. No stridor. No respiratory distress. He has wheezes. He has rales. He exhibits no tenderness.  Diminished breath sounds to bilateral bases;  with slight wheeze.  No cough or acute respiratory distress noted.  Abdominal: Soft. Bowel sounds are normal. He exhibits no distension and no mass. There is no tenderness. There is no rebound and no guarding.  Musculoskeletal: Normal range of motion. He exhibits no edema or tenderness.  Lymphadenopathy:    He has no cervical adenopathy.  Neurological: He is alert and oriented to person, place, and time.  Skin: Skin is warm and dry. No rash noted. No erythema. No pallor.  Psychiatric: Affect normal.  Nursing note and vitals reviewed.   LABORATORY DATA:. Office Visit on 10/31/2014  Component Date Value Ref Range Status  . Unit Number 10/31/2014 [id number]   Final  . Blood Component Type 10/31/2014 PLTPHER LR1   Final  . Unit division 10/31/2014 00   Final  . Status of Unit 10/31/2014 ISSUED,FINAL   Final  . Transfusion Status 10/31/2014 OK TO TRANSFUSE   Final  Appointment on 10/31/2014  Component Date Value Ref Range Status  . WBC 10/31/2014 7.6  4.0 - 10.3 10e3/uL Final  . NEUT# 10/31/2014 5.9  1.5 - 6.5 10e3/uL Final  . HGB 10/31/2014 7.8* 13.0 - 17.1 g/dL Final  . HCT 10/31/2014 22.3* 38.4 - 49.9 % Final  . Platelets 10/31/2014 7* 140 - 400 10e3/uL Final  . MCV 10/31/2014 90.1  79.3 - 98.0 fL Final  . MCH 10/31/2014 31.7  27.2 - 33.4 pg Final  . MCHC 10/31/2014 35.2  32.0 - 36.0 g/dL Final  . RBC 10/31/2014 2.47* 4.20 - 5.82 10e6/uL Final  . RDW 10/31/2014 12.3  11.0 - 14.6 % Final  . lymph# 10/31/2014 0.7* 0.9 - 3.3 10e3/uL Final  . MONO# 10/31/2014 0.9  0.1 - 0.9 10e3/uL Final  . Eosinophils Absolute 10/31/2014 0.0  0.0 - 0.5 10e3/uL Final  . Basophils Absolute 10/31/2014 0.1  0.0 - 0.1 10e3/uL Final  . NEUT% 10/31/2014 77.3* 39.0 - 75.0 % Final  . LYMPH% 10/31/2014 9.8* 14.0 - 49.0 % Final  . MONO% 10/31/2014 11.7  0.0 - 14.0 % Final  . EOS% 10/31/2014 0.2  0.0 - 7.0 % Final  . BASO% 10/31/2014 1.0  0.0 - 2.0 % Final  . Sodium 10/31/2014 140  136 - 145 mEq/L Final  .  Potassium 10/31/2014 3.9  3.5 - 5.1 mEq/L Final  . Chloride 10/31/2014 102  98 - 109 mEq/L Final  . CO2 10/31/2014 31* 22 - 29 mEq/L Final  . Glucose 10/31/2014 123  70 - 140 mg/dl Final  . BUN 10/31/2014 11.0  7.0 - 26.0 mg/dL Final  . Creatinine 10/31/2014 0.9  0.7 - 1.3 mg/dL Final  . Total Bilirubin 10/31/2014 0.24  0.20 - 1.20 mg/dL Final  . Alkaline Phosphatase 10/31/2014 116  40 - 150 U/L Final  . AST 10/31/2014 15  5 - 34 U/L Final  . ALT 10/31/2014 14  0 - 55 U/L Final  . Total Protein 10/31/2014 6.5  6.4 - 8.3 g/dL Final  . Albumin 10/31/2014 3.3* 3.5 - 5.0 g/dL Final  . Calcium 10/31/2014 8.9  8.4 - 10.4 mg/dL Final  . Anion Gap 10/31/2014 8  3 - 11 mEq/L Final  . EGFR 10/31/2014 >90  >90 ml/min/1.73 m2 Final   eGFR is calculated using the CKD-EPI Creatinine Equation (2009)  Hospital Outpatient Visit on 10/31/2014  Component Date Value Ref Range Status  . Order Confirmation 10/31/2014 ORDER PROCESSED BY BLOOD BANK   Final  . ABO/RH(D) 10/31/2014 A POS   Final  . Antibody Screen 10/31/2014 NEG   Final  . Sample Expiration 10/31/2014 11/03/2014   Final  . Unit Number 10/31/2014 [id number]   Final  . Blood Component Type 10/31/2014 RED CELLS,LR   Final  . Unit division 10/31/2014 00   Final  . Status of Unit 10/31/2014 ISSUED,FINAL   Final  . Transfusion Status 10/31/2014 OK TO TRANSFUSE   Final  . Crossmatch Result 10/31/2014  Compatible   Final     RADIOGRAPHIC STUDIES: No results found.  ASSESSMENT/PLAN:    Antineoplastic chemotherapy induced pancytopenia Patient received cycle 3, day 1 of his carboplatin/etoposide chemotherapy on 10/16/2014.  Blood counts obtained today reveal a WBC of 7.6, ANC 5.9, hemoglobin 7.8, and platelet count 7.  Patient is complaining of some bronchitis-like symptoms today.  He also reports some mild shortness of breath with exertion and increased fatigue.  He denies any recent fevers or chills.  The plan is for the patient to receive  1 unit packed red blood cell transfusion and 1 unit of platelets while at the infusion center today.  Also, patient continues with home O2 on an as-needed basis.  Bronchitis Patient is complaining of some bronchitis-like symptoms today.  He also reports some mild shortness of breath with exertion and increased fatigue.  He denies any recent fevers or chills.  On exam patient has decreased breath sounds bilaterally; and a congested cough.  He is also noted to have some wheezing as well.  There is no acute respiratory distress on exam.  Patient continues with O2 at 2 L via nasal cannula.  Patient received 1 albuterol nebulizer treatment while at the cancer Center today.  Patient states that his breathing did slightly improve after his nebulizer treatment.  Will also prescribed Levaquin antibiotics for treatment of presumed bronchitis symptoms.      Also, patient continues with home O2 on an as-needed basis.  Long term current use of anticoagulant therapy Patient has a history of pulmonary embolism in the past; and continues to take Xarelto as directed.  Small cell carcinoma of lung Patient received cycle 3, day 1 of his carboplatin/etoposide chemotherapy on 10/16/2014.  Blood counts obtained today did reveal a WBC of 7.6, ANC 5.9, hemogram and 7.8, platelet count 7.  Patient will receive 1 unit packed red blood cell transfusion and 1 unit of platelets today for chemotherapy-associated pancytopenia.  Patient is scheduled for a restaging CT of the chest/abdomen/pelvis with contrast on 11/03/2014.  Patient is scheduled to return for flush, labs, visit, and his next cycle of chemotherapy on 11/06/2014.  Patient stated understanding of all instructions; and was in agreement with this plan of care. The patient knows to call the clinic with any problems, questions or concerns.   Review/collaboration with Dr. Benay Spice regarding all aspects of patient's visit today.   Total time spent with  patient was 25 minutes;  with greater than 75 percent of that time spent in face to face counseling regarding patient's symptoms,  and coordination of care and follow up.  Disclaimer:This dictation was prepared with Dragon/digital dictation along with [employer]. Any transcriptional errors that result from this process are unintentional.  Drue Second, NP 11/01/2014

## 2014-11-01 NOTE — Telephone Encounter (Signed)
Voicemail from patient's daughter "requesting script for albuterol.  What he has is expired.  Please call [phone] or their home [phone]."

## 2014-11-01 NOTE — Telephone Encounter (Signed)
OK to refill this time. He may need to receive further refill from PCP.

## 2014-11-01 NOTE — Assessment & Plan Note (Signed)
Patient received cycle 3, day 1 of his carboplatin/etoposide chemotherapy on 10/16/2014.  Blood counts obtained today did reveal a WBC of 7.6, ANC 5.9, hemogram and 7.8, platelet count 7.  Patient will receive 1 unit packed red blood cell transfusion and 1 unit of platelets today for chemotherapy-associated pancytopenia.  Patient is scheduled for a restaging CT of the chest/abdomen/pelvis with contrast on 11/03/2014.  Patient is scheduled to return for flush, labs, visit, and his next cycle of chemotherapy on 11/06/2014.

## 2014-11-01 NOTE — Assessment & Plan Note (Signed)
Patient is complaining of some bronchitis-like symptoms today.  He also reports some mild shortness of breath with exertion and increased fatigue.  He denies any recent fevers or chills.  On exam patient has decreased breath sounds bilaterally; and a congested cough.  He is also noted to have some wheezing as well.  There is no acute respiratory distress on exam.  Patient continues with O2 at 2 L via nasal cannula.  Patient received 1 albuterol nebulizer treatment while at the cancer Center today.  Patient states that his breathing did slightly improve after his nebulizer treatment.  Will also prescribed Levaquin antibiotics for treatment of presumed bronchitis symptoms.      Also, patient continues with home O2 on an as-needed basis.

## 2014-11-01 NOTE — Assessment & Plan Note (Signed)
Patient has a history of pulmonary embolism in the past; and continues to take Xarelto as directed.

## 2014-11-02 NOTE — Addendum Note (Signed)
Addended by: Adalberto Cole on: 11/02/2014 04:10 PM   Modules accepted: Orders

## 2014-11-03 ENCOUNTER — Ambulatory Visit (HOSPITAL_BASED_OUTPATIENT_CLINIC_OR_DEPARTMENT_OTHER): Payer: BLUE CROSS/BLUE SHIELD | Admitting: Nurse Practitioner

## 2014-11-03 ENCOUNTER — Telehealth: Payer: Self-pay | Admitting: Medical Oncology

## 2014-11-03 ENCOUNTER — Encounter (HOSPITAL_COMMUNITY): Payer: Self-pay

## 2014-11-03 ENCOUNTER — Other Ambulatory Visit: Payer: Self-pay | Admitting: Medical Oncology

## 2014-11-03 ENCOUNTER — Ambulatory Visit (HOSPITAL_COMMUNITY)
Admission: RE | Admit: 2014-11-03 | Discharge: 2014-11-03 | Disposition: A | Discharge status: Home / Self Care | Payer: BLUE CROSS/BLUE SHIELD | Source: Ambulatory Visit | Attending: Internal Medicine | Admitting: Internal Medicine

## 2014-11-03 ENCOUNTER — Other Ambulatory Visit (HOSPITAL_BASED_OUTPATIENT_CLINIC_OR_DEPARTMENT_OTHER): Payer: BLUE CROSS/BLUE SHIELD

## 2014-11-03 VITALS — BP 124/82 | HR 113 | Temp 98.2°F | Resp 17 | Ht 67.0 in | Wt 111.6 lb

## 2014-11-03 DIAGNOSIS — D61810 Antineoplastic chemotherapy induced pancytopenia: Secondary | ICD-10-CM

## 2014-11-03 DIAGNOSIS — T451X5A Adverse effect of antineoplastic and immunosuppressive drugs, initial encounter: Secondary | ICD-10-CM

## 2014-11-03 DIAGNOSIS — C7B8 Other secondary neuroendocrine tumors: Secondary | ICD-10-CM

## 2014-11-03 DIAGNOSIS — R042 Hemoptysis: Secondary | ICD-10-CM

## 2014-11-03 DIAGNOSIS — C7A1 Malignant poorly differentiated neuroendocrine tumors: Secondary | ICD-10-CM | POA: Diagnosis not present

## 2014-11-03 DIAGNOSIS — Z87891 Personal history of nicotine dependence: Secondary | ICD-10-CM | POA: Insufficient documentation

## 2014-11-03 DIAGNOSIS — C3490 Malignant neoplasm of unspecified part of unspecified bronchus or lung: Secondary | ICD-10-CM

## 2014-11-03 DIAGNOSIS — Z7901 Long term (current) use of anticoagulants: Secondary | ICD-10-CM

## 2014-11-03 LAB — CBC WITH DIFFERENTIAL/PLATELET
BASO%: 0.3 % (ref 0.0–2.0)
BASOS ABS: 0.1 10*3/uL (ref 0.0–0.1)
EOS%: 0.1 % (ref 0.0–7.0)
Eosinophils Absolute: 0 10*3/uL (ref 0.0–0.5)
HCT: 30 % — ABNORMAL LOW (ref 38.4–49.9)
HGB: 10 g/dL — ABNORMAL LOW (ref 13.0–17.1)
LYMPH%: 3.8 % — AB (ref 14.0–49.0)
MCH: 30.2 pg (ref 27.2–33.4)
MCHC: 33.2 g/dL (ref 32.0–36.0)
MCV: 90.7 fL (ref 79.3–98.0)
MONO#: 1.5 10*3/uL — AB (ref 0.1–0.9)
MONO%: 8.4 % (ref 0.0–14.0)
NEUT#: 15.5 10*3/uL — ABNORMAL HIGH (ref 1.5–6.5)
NEUT%: 87.4 % — AB (ref 39.0–75.0)
PLATELETS: 63 10*3/uL — AB (ref 140–400)
RBC: 3.31 10*6/uL — AB (ref 4.20–5.82)
RDW: 12.5 % (ref 11.0–14.6)
WBC: 17.7 10*3/uL — ABNORMAL HIGH (ref 4.0–10.3)
lymph#: 0.7 10*3/uL — ABNORMAL LOW (ref 0.9–3.3)

## 2014-11-03 LAB — HOLD TUBE, BLOOD BANK

## 2014-11-03 MED ORDER — IOHEXOL 300 MG/ML  SOLN
100.0000 mL | Freq: Once | INTRAMUSCULAR | Status: AC | PRN
  Administered 2014-11-03: 100 mL via INTRAVENOUS

## 2014-11-03 NOTE — Telephone Encounter (Signed)
Ricky Villanueva reports pt in radiology getting CT scan. He has been having periodic hemoptysis -1 tablespoon BRB  when he coughs since Tuesday.  Received platelets earlier in week.Onc tx request entered for labs and [facility].

## 2014-11-05 ENCOUNTER — Encounter: Payer: Self-pay | Admitting: Nurse Practitioner

## 2014-11-05 ENCOUNTER — Other Ambulatory Visit: Payer: Self-pay | Admitting: Internal Medicine

## 2014-11-05 DIAGNOSIS — R042 Hemoptysis: Secondary | ICD-10-CM | POA: Insufficient documentation

## 2014-11-05 NOTE — Assessment & Plan Note (Addendum)
Pt c/o occasional hemoptysis this past week. He denies any worsening resp issues. He continues with intermittent home O2 as needed.   Blood counts obtained today reveal a WBC of 17.7, ANC 15.5, hemoglobin 10.0, and platelet count improved from 7 to 63 following platelet transfusion earlier this week. .  Pt also received a blood transfusion this week as well.   Pt also takes Xarelto daily for previous dx of PE.   Also, patient continues with home O2 on an as-needed basis.  Advised pt that new onset hemoptysis could be secondary to his disease process, irritation from coughing, thrombocytopenia and/or low platelets.  Advised pt to call/return or go directly to ED for any worsening symptoms whatsoever.

## 2014-11-05 NOTE — Progress Notes (Signed)
SYMPTOM MANAGEMENT CLINIC   HPI: Ricky Villanueva 64 y.o. male diagnosed with lung cancer.  Currently undergoing carboplatin/etoposide chemotherapy regimen.  Pt is c/o intermittent hemoptysis within past few days. He denies any other new symptoms.   He denies any recent fever or chills.   Pt underwent restaging CT earlier today.    HPI  ROS  Past Medical History  Diagnosis Date  . Hyperlipidemia   . Hypertension   . GERD (gastroesophageal reflux disease)   . Respiratory failure with hypoxia 04/21/2013    secondary to pneumonia/notes 04/21/2013  . Pulmonary embolism     "got one in there now" (04/21/2013)  . COPD (chronic obstructive pulmonary disease)   . Pneumonia 2/?01/2014    "wouldn't get better; hospitalized 04/21/2013)  . Arthritis     "minor in my right hand" (04/21/2013)  . History of radiation therapy 05/03/13-05/18/13    chest & whole brain  . Cancer     lung ca dx'd 03/2013  . Colon polyps     hyperplastic  . Right arm pain 08/21/2014    Past Surgical History  Procedure Laterality Date  . Inguinal hernia repair Right ~ 2005  . Finger surgery Left ~ 1989    "crushed end of my middle finger off"   . Video bronchoscopy Bilateral 04/26/2013    Procedure: VIDEO BRONCHOSCOPY WITH FLUORO;  Surgeon: Wilhelmina Mcardle, MD;  Location: [facility] ENDOSCOPY;  Service: Cardiopulmonary;  Laterality: Bilateral;    has GERD (gastroesophageal reflux disease); HCAP (healthcare-associated pneumonia); Respiratory failure with hypoxia; HTN (hypertension); Acute pulmonary embolism; Lung mass; Emphysema lung; Small cell carcinoma of lung; Acute renal failure; Protein-calorie malnutrition, severe; Anorexia; Radiation pneumonitis; Right arm pain; Long term current use of anticoagulant therapy; Antineoplastic chemotherapy induced pancytopenia; Bronchitis; and Hemoptysis on his problem list.    is allergic to decadron and augmentin.    Medication List       This list is accurate as of: 11/03/14  11:59 PM.  Always use your most recent med list.               albuterol 108 (90 BASE) MCG/ACT inhaler  Commonly known as:  PROVENTIL HFA;VENTOLIN HFA  Inhale 2 puffs into the lungs every 6 (six) hours as needed for wheezing or shortness of breath.     chlorpheniramine-HYDROcodone 10-8 MG/5ML Suer  Commonly known as:  TUSSIONEX PENNKINETIC ER  Take 5 mLs by mouth every 12 (twelve) hours as needed for cough.     dextromethorphan 15 MG/5ML syrup  Take 10 mLs by mouth as needed for cough.     docusate sodium 100 MG capsule  Commonly known as:  COLACE  Take 300 mg by mouth 2 (two) times daily as needed for mild constipation.     GERITOL PO  Take 1 tablet by mouth daily.     levofloxacin 500 MG tablet  Commonly known as:  LEVAQUIN  Take 1 tablet (500 mg total) by mouth daily.     lidocaine-prilocaine cream  Commonly known as:  EMLA  Apply to port site one hour before treatment and cover with plastic wrap.     LORazepam 0.5 MG tablet  Commonly known as:  ATIVAN  Take 1 tablet (0.5 mg total) by mouth 2 (two) times daily. for anxiety     magnesium hydroxide 400 MG/5ML suspension  Commonly known as:  MILK OF MAGNESIA  Take 5 mLs by mouth daily as needed for mild constipation.     mirtazapine 30  MG tablet  Commonly known as:  REMERON  Take 1 tablet (30 mg total) by mouth at bedtime.     morphine 30 MG 12 hr tablet  Commonly known as:  MS CONTIN  Take 1 tablet (30 mg total) by mouth every 12 (twelve) hours.     omeprazole 40 MG capsule  Commonly known as:  PRILOSEC  Take 40 mg by mouth daily.     ondansetron 8 MG tablet  Commonly known as:  ZOFRAN  Take 1 tablet (8 mg total) by mouth every 8 (eight) hours as needed for nausea or vomiting (nausea).     oxyCODONE 5 MG immediate release tablet  Commonly known as:  Oxy IR/ROXICODONE  Take 1 tablet (5 mg total) by mouth every 6 (six) hours as needed for severe pain.     polyethylene glycol powder powder  Commonly known  as:  MIRALAX  Take 17 g by mouth daily.     prochlorperazine 10 MG tablet  Commonly known as:  COMPAZINE  Take 1 tablet (10 mg total) by mouth every 6 (six) hours as needed for nausea or vomiting (nausea).     tiotropium 18 MCG inhalation capsule  Commonly known as:  SPIRIVA HANDIHALER  Place 1 capsule (18 mcg total) into inhaler and inhale daily.     vitamin B-12 100 MCG tablet  Commonly known as:  CYANOCOBALAMIN  Take 100 mcg by mouth daily.     XARELTO 20 MG Tabs tablet  Generic drug:  rivaroxaban  TAKE 1 TABLET (20 MG TOTAL) BY MOUTH DAILY WITH SUPPER.         PHYSICAL EXAMINATION  Oncology Vitals 11/03/2014 10/31/2014 10/31/2014 10/31/2014 10/31/2014 10/31/2014 10/31/2014  Height 170 cm - - - - - -  Weight 50.621 kg - - - - - 49.805 kg  Weight (lbs) 111 lbs 10 oz - - - - - 109 lbs 13 oz  BMI (kg/m2) 17.48 kg/m2 - - - - - -  Temp 98.2 98 98.2 98.2 98 98 97.8  Pulse 113 69 76 84 90 87 131  Resp '17 18 18 18 18 18 16  '$ SpO2 94 - 96 95 97 96 (No Data)  BSA (m2) 1.55 m2 - - - - - -   BP Readings from Last 3 Encounters:  11/03/14 124/82  10/31/14 101/71  10/31/14 114/79    Physical Exam  Constitutional: He is oriented to person, place, and time. Vital signs are normal. He appears malnourished. He appears unhealthy. He appears cachectic.  Patient appears fatigued, weak, frail, and chronically ill.  HENT:  Head: Normocephalic and atraumatic.  Mouth/Throat: Oropharynx is clear and moist.  Eyes: Conjunctivae and EOM are normal. Pupils are equal, round, and reactive to light. Right eye exhibits no discharge. Left eye exhibits no discharge. No scleral icterus.  Neck: Normal range of motion. Neck supple. No JVD present. No tracheal deviation present. No thyromegaly present.  Cardiovascular: Normal rate, regular rhythm, normal heart sounds and intact distal pulses.   Pulmonary/Chest: Effort normal. No stridor. No respiratory distress. He has no wheezes. He has no rales. He exhibits no  tenderness.  Diminished breath sounds to bilateral bases; but no wheezes.  No cough or acute respiratory distress noted.  Abdominal: Soft. Bowel sounds are normal. He exhibits no distension and no mass. There is no tenderness. There is no rebound and no guarding.  Musculoskeletal: Normal range of motion. He exhibits no edema or tenderness.  Lymphadenopathy:    He has no  cervical adenopathy.  Neurological: He is alert and oriented to person, place, and time.  Skin: Skin is warm and dry. No rash noted. No erythema. No pallor.  Psychiatric: Affect normal.  Nursing note and vitals reviewed.   LABORATORY DATA:. Appointment on 11/03/2014  Component Date Value Ref Range Status  . WBC 11/03/2014 17.7* 4.0 - 10.3 10e3/uL Final  . NEUT# 11/03/2014 15.5* 1.5 - 6.5 10e3/uL Final  . HGB 11/03/2014 10.0* 13.0 - 17.1 g/dL Final  . HCT 11/03/2014 30.0* 38.4 - 49.9 % Final  . Platelets 11/03/2014 63* 140 - 400 10e3/uL Final  . MCV 11/03/2014 90.7  79.3 - 98.0 fL Final  . MCH 11/03/2014 30.2  27.2 - 33.4 pg Final  . MCHC 11/03/2014 33.2  32.0 - 36.0 g/dL Final  . RBC 11/03/2014 3.31* 4.20 - 5.82 10e6/uL Final  . RDW 11/03/2014 12.5  11.0 - 14.6 % Final  . lymph# 11/03/2014 0.7* 0.9 - 3.3 10e3/uL Final  . MONO# 11/03/2014 1.5* 0.1 - 0.9 10e3/uL Final  . Eosinophils Absolute 11/03/2014 0.0  0.0 - 0.5 10e3/uL Final  . Basophils Absolute 11/03/2014 0.1  0.0 - 0.1 10e3/uL Final  . NEUT% 11/03/2014 87.4* 39.0 - 75.0 % Final  . LYMPH% 11/03/2014 3.8* 14.0 - 49.0 % Final  . MONO% 11/03/2014 8.4  0.0 - 14.0 % Final  . EOS% 11/03/2014 0.1  0.0 - 7.0 % Final  . BASO% 11/03/2014 0.3  0.0 - 2.0 % Final  . Hold Tube, Blood Bank 11/03/2014 Blood Bank Order Cancelled   Final     RADIOGRAPHIC STUDIES: Ct Chest W Contrast  11/03/2014   CLINICAL DATA:  Restaging lung cancer diagnosed 2015  EXAM: CT CHEST, ABDOMEN, AND PELVIS WITH CONTRAST  TECHNIQUE: Multidetector CT imaging of the chest, abdomen and pelvis was  performed following the standard protocol during bolus administration of intravenous contrast.  CONTRAST:  148m OMNIPAQUE IOHEXOL 300 MG/ML  SOLN  COMPARISON:  CT 09/01/2014  FINDINGS: CT CHEST FINDINGS  Mediastinum/Nodes: No axillary or supraclavicular lymphadenopathy. No mediastinal hilar adenopathy. No pericardial fluid. Coronary calcifications noted. Contrast reflux into the esophagus.  Lungs/Pleura: There is consolidative pattern within the RIGHT lower lobe superimposed on centrilobular emphysema. The density of the consolidation has improved compared to CT 09/01/2014. Additionally, medial RIGHT upper lobe nodularity has resolved along the mediastinum. Discrete 5 mm nodule in the RIGHT upper lobe on image 33, series 4 is new. LEFT lung is clear.  CT ABDOMEN AND PELVIS FINDINGS  Hepatobiliary: 4 mm low-density lesion the RIGHT hepatic lobe is unchanged. Low-density lesion anterior central LEFT hepatic lobe measuring 4 mm image 55 is also unchanged. The common bile duct is dilated similar prior.  Pancreas: Pancreas is normal. No ductal dilatation. No pancreatic inflammation.  Spleen: Normal spleen  Adrenals/urinary tract: Adrenal glands kidneys are normal. There is low-density lesions in the LEFT kidney unchanged. Ureters and bladder normal.  Stomach/Bowel: Stomach, small bowel, appendix, and cecum are normal. The colon and rectosigmoid colon are normal.  Vascular/Lymphatic: Abdominal aorta is normal caliber with atherosclerotic calcification. There is no retroperitoneal or periportal lymphadenopathy. No pelvic lymphadenopathy.  Reproductive: Prostate normal  Musculoskeletal: New sclerotic lesion in the T10 vertebral body measuring 9 mm on image 41, series 2.  Well-circumscribed sclerotic lesion in the LEFT iliac bone measuring 9 mm is unchanged  Other: No free fluid.  IMPRESSION: Chest Impression:  1. Interval improvemed consolidation in the RIGHT lower lobe suggests resolving infectious or inflammatory  process. Recommend attention on  follow-up as nodularity does persist. 2. Improvement in nodularity in the medial RIGHT upper lobe also consistent with resolving infectious or inflammatory process. 3. New 5 pulmonary nodule in the RIGHT upper lobe is indeterminate. Recommend attention on follow-up.  Abdomen / Pelvis Impression:  1. No evidence of metastasis in the soft tissues of the abdomen or pelvis. 2. NEW SCLEROTIC LESION WITHIN THE T10 VERTEBRAL BODY IS CONSISTENT WITH SKELETAL METASTASIS.   Electronically Signed   By: Suzy Bouchard M.D.   On: 11/03/2014 09:58   Ct Abdomen Pelvis W Contrast  11/03/2014   CLINICAL DATA:  Restaging lung cancer diagnosed 2015  EXAM: CT CHEST, ABDOMEN, AND PELVIS WITH CONTRAST  TECHNIQUE: Multidetector CT imaging of the chest, abdomen and pelvis was performed following the standard protocol during bolus administration of intravenous contrast.  CONTRAST:  149m OMNIPAQUE IOHEXOL 300 MG/ML  SOLN  COMPARISON:  CT 09/01/2014  FINDINGS: CT CHEST FINDINGS  Mediastinum/Nodes: No axillary or supraclavicular lymphadenopathy. No mediastinal hilar adenopathy. No pericardial fluid. Coronary calcifications noted. Contrast reflux into the esophagus.  Lungs/Pleura: There is consolidative pattern within the RIGHT lower lobe superimposed on centrilobular emphysema. The density of the consolidation has improved compared to CT 09/01/2014. Additionally, medial RIGHT upper lobe nodularity has resolved along the mediastinum. Discrete 5 mm nodule in the RIGHT upper lobe on image 33, series 4 is new. LEFT lung is clear.  CT ABDOMEN AND PELVIS FINDINGS  Hepatobiliary: 4 mm low-density lesion the RIGHT hepatic lobe is unchanged. Low-density lesion anterior central LEFT hepatic lobe measuring 4 mm image 55 is also unchanged. The common bile duct is dilated similar prior.  Pancreas: Pancreas is normal. No ductal dilatation. No pancreatic inflammation.  Spleen: Normal spleen  Adrenals/urinary tract:  Adrenal glands kidneys are normal. There is low-density lesions in the LEFT kidney unchanged. Ureters and bladder normal.  Stomach/Bowel: Stomach, small bowel, appendix, and cecum are normal. The colon and rectosigmoid colon are normal.  Vascular/Lymphatic: Abdominal aorta is normal caliber with atherosclerotic calcification. There is no retroperitoneal or periportal lymphadenopathy. No pelvic lymphadenopathy.  Reproductive: Prostate normal  Musculoskeletal: New sclerotic lesion in the T10 vertebral body measuring 9 mm on image 41, series 2.  Well-circumscribed sclerotic lesion in the LEFT iliac bone measuring 9 mm is unchanged  Other: No free fluid.  IMPRESSION: Chest Impression:  1. Interval improvemed consolidation in the RIGHT lower lobe suggests resolving infectious or inflammatory process. Recommend attention on follow-up as nodularity does persist. 2. Improvement in nodularity in the medial RIGHT upper lobe also consistent with resolving infectious or inflammatory process. 3. New 5 pulmonary nodule in the RIGHT upper lobe is indeterminate. Recommend attention on follow-up.  Abdomen / Pelvis Impression:  1. No evidence of metastasis in the soft tissues of the abdomen or pelvis. 2. NEW SCLEROTIC LESION WITHIN THE T10 VERTEBRAL BODY IS CONSISTENT WITH SKELETAL METASTASIS.   Electronically Signed   By: SSuzy BouchardM.D.   On: 11/03/2014 09:58    ASSESSMENT/PLAN:    Antineoplastic chemotherapy induced pancytopenia Patient received cycle 3, day 1 of his carboplatin/etoposide chemotherapy on 10/16/2014. He received Neulasta injection after chemo.   Blood counts obtained today reveal a WBC of 17.7, ANC 15.5, hemoglobin 10.0, and platelet count improved from 7 to 63 following platelet transfusion earlier this week. .  Pt also received a blood transfusion this week as well.   Also, patient continues with home O2 on an as-needed basis.    Hemoptysis Pt c/o occasional hemoptysis this past week.  He  denies any worsening resp issues. He continues with intermittent home O2 as needed.   Blood counts obtained today reveal a WBC of 17.7, ANC 15.5, hemoglobin 10.0, and platelet count improved from 7 to 63 following platelet transfusion earlier this week. .  Pt also received a blood transfusion this week as well.   Pt also takes Xarelto daily for previous dx of PE.   Also, patient continues with home O2 on an as-needed basis.  Advised pt that new onset hemoptysis could be secondary to his disease process, irritation from coughing, thrombocytopenia and/or low platelets.  Advised pt to call/return or go directly to ED for any worsening symptoms whatsoever.     Long term current use of anticoagulant therapy Patient has a history of pulmonary embolism in the past; and continues to take Xarelto as directed.    Small cell carcinoma of lung Patient received cycle 3, day 1 of his carboplatin/etoposide chemotherapy on 10/16/2014.  Blood counts obtained today did reveal a WBC of 717.7, ANC 15.5, hemoglobin 10.8, platelet count 63.  Patient received 1 unit packed red blood cell transfusion and 1 unit of platelets earlier this week for chemotherapy-associated pancytopenia.  Restaging CT obtained earlier today revealed a mixed response:   IMPRESSION: Chest Impression:  1. Interval improvemed consolidation in the RIGHT lower lobe suggests resolving infectious or inflammatory process. Recommend attention on follow-up as nodularity does persist. 2. Improvement in nodularity in the medial RIGHT upper lobe also consistent with resolving infectious or inflammatory process. 3. New 5 pulmonary nodule in the RIGHT upper lobe is indeterminate. Recommend attention on follow-up.  Abdomen / Pelvis Impression:  1. No evidence of metastasis in the soft tissues of the abdomen or pelvis. 2. NEW SCLEROTIC LESION WITHIN THE T10 VERTEBRAL BODY IS CONSISTENT WITH SKELETAL METASTASIS.  Pt requested review  of his scans; and this provider briefly reviewed all scan results with both pt and his family. Advised pt that Dr. [name] would review scan results in detail when pt returns on 11/06/14.   Both pt and his family was comfortable with this plan.      Patient stated understanding of all instructions; and was in agreement with this plan of care. The patient knows to call the clinic with any problems, questions or concerns.   Review/collaboration with Dr. [name] regarding all aspects of patient's visit today.   Total time spent with patient was 25 minutes;  with greater than 75 percent of that time spent in face to face counseling regarding patient's symptoms,  and coordination of care and follow up.  Disclaimer:This dictation was prepared with Dragon/digital dictation along with [employer]. Any transcriptional errors that result from this process are unintentional.  Drue Second, NP 11/05/2014

## 2014-11-05 NOTE — Assessment & Plan Note (Signed)
Patient received cycle 3, day 1 of his carboplatin/etoposide chemotherapy on 10/16/2014.  Blood counts obtained today did reveal a WBC of 717.7, ANC 15.5, hemoglobin 10.8, platelet count 63.  Patient received 1 unit packed red blood cell transfusion and 1 unit of platelets earlier this week for chemotherapy-associated pancytopenia.  Restaging CT obtained earlier today revealed a mixed response:   IMPRESSION: Chest Impression:  1. Interval improvemed consolidation in the RIGHT lower lobe suggests resolving infectious or inflammatory process. Recommend attention on follow-up as nodularity does persist. 2. Improvement in nodularity in the medial RIGHT upper lobe also consistent with resolving infectious or inflammatory process. 3. New 5 pulmonary nodule in the RIGHT upper lobe is indeterminate. Recommend attention on follow-up.  Abdomen / Pelvis Impression:  1. No evidence of metastasis in the soft tissues of the abdomen or pelvis. 2. NEW SCLEROTIC LESION WITHIN THE T10 VERTEBRAL BODY IS CONSISTENT WITH SKELETAL METASTASIS.  Pt requested review of his scans; and this provider briefly reviewed all scan results with both pt and his family. Advised pt that Dr. [name] would review scan results in detail when pt returns on 11/06/14.   Both pt and his family was comfortable with this plan.

## 2014-11-05 NOTE — Assessment & Plan Note (Signed)
Patient has a history of pulmonary embolism in the past; and continues to take Xarelto as directed.

## 2014-11-05 NOTE — Assessment & Plan Note (Signed)
Patient received cycle 3, day 1 of his carboplatin/etoposide chemotherapy on 10/16/2014. He received Neulasta injection after chemo.   Blood counts obtained today reveal a WBC of 17.7, ANC 15.5, hemoglobin 10.0, and platelet count improved from 7 to 63 following platelet transfusion earlier this week. .  Pt also received a blood transfusion this week as well.   Also, patient continues with home O2 on an as-needed basis.

## 2014-11-06 ENCOUNTER — Ambulatory Visit (HOSPITAL_BASED_OUTPATIENT_CLINIC_OR_DEPARTMENT_OTHER): Payer: BLUE CROSS/BLUE SHIELD | Admitting: Physician Assistant

## 2014-11-06 ENCOUNTER — Ambulatory Visit: Payer: BLUE CROSS/BLUE SHIELD

## 2014-11-06 ENCOUNTER — Encounter: Payer: Self-pay | Admitting: Physician Assistant

## 2014-11-06 ENCOUNTER — Other Ambulatory Visit (HOSPITAL_BASED_OUTPATIENT_CLINIC_OR_DEPARTMENT_OTHER): Payer: BLUE CROSS/BLUE SHIELD

## 2014-11-06 VITALS — BP 111/80 | HR 91 | Temp 98.2°F | Resp 18 | Ht 67.0 in | Wt 108.7 lb

## 2014-11-06 DIAGNOSIS — C3491 Malignant neoplasm of unspecified part of right bronchus or lung: Secondary | ICD-10-CM

## 2014-11-06 DIAGNOSIS — C7951 Secondary malignant neoplasm of bone: Secondary | ICD-10-CM

## 2014-11-06 DIAGNOSIS — R918 Other nonspecific abnormal finding of lung field: Secondary | ICD-10-CM

## 2014-11-06 DIAGNOSIS — C7A1 Malignant poorly differentiated neuroendocrine tumors: Secondary | ICD-10-CM

## 2014-11-06 DIAGNOSIS — R042 Hemoptysis: Secondary | ICD-10-CM

## 2014-11-06 DIAGNOSIS — M545 Low back pain, unspecified: Secondary | ICD-10-CM

## 2014-11-06 DIAGNOSIS — C3490 Malignant neoplasm of unspecified part of unspecified bronchus or lung: Secondary | ICD-10-CM

## 2014-11-06 DIAGNOSIS — M79601 Pain in right arm: Secondary | ICD-10-CM

## 2014-11-06 DIAGNOSIS — K1230 Oral mucositis (ulcerative), unspecified: Secondary | ICD-10-CM

## 2014-11-06 DIAGNOSIS — C7B8 Other secondary neuroendocrine tumors: Secondary | ICD-10-CM

## 2014-11-06 LAB — CBC WITH DIFFERENTIAL/PLATELET
BASO%: 0.6 % (ref 0.0–2.0)
Basophils Absolute: 0.1 10*3/uL (ref 0.0–0.1)
EOS%: 0.2 % (ref 0.0–7.0)
Eosinophils Absolute: 0 10*3/uL (ref 0.0–0.5)
HCT: 28.7 % — ABNORMAL LOW (ref 38.4–49.9)
HGB: 9.6 g/dL — ABNORMAL LOW (ref 13.0–17.1)
LYMPH%: 5.7 % — AB (ref 14.0–49.0)
MCH: 30.8 pg (ref 27.2–33.4)
MCHC: 33.5 g/dL (ref 32.0–36.0)
MCV: 91.9 fL (ref 79.3–98.0)
MONO#: 1.4 10*3/uL — ABNORMAL HIGH (ref 0.1–0.9)
MONO%: 9.8 % (ref 0.0–14.0)
NEUT%: 83.7 % — ABNORMAL HIGH (ref 39.0–75.0)
NEUTROS ABS: 11.7 10*3/uL — AB (ref 1.5–6.5)
Platelets: 78 10*3/uL — ABNORMAL LOW (ref 140–400)
RBC: 3.13 10*6/uL — AB (ref 4.20–5.82)
RDW: 12.9 % (ref 11.0–14.6)
WBC: 14 10*3/uL — AB (ref 4.0–10.3)
lymph#: 0.8 10*3/uL — ABNORMAL LOW (ref 0.9–3.3)

## 2014-11-06 MED ORDER — MIRTAZAPINE 30 MG PO TABS: 30.0000 mg | ORAL_TABLET | Freq: Every day | ORAL | Status: DC

## 2014-11-06 MED ORDER — MORPHINE SULFATE ER 30 MG PO TBCR: 30.0000 mg | EXTENDED_RELEASE_TABLET | Freq: Two times a day (BID) | ORAL | Status: DC

## 2014-11-06 MED ORDER — HEPARIN SOD (PORK) LOCK FLUSH 100 UNIT/ML IV SOLN
500.0000 [IU] | Freq: Once | INTRAVENOUS | Status: AC
  Administered 2014-11-06: 500 [IU] via INTRAVENOUS
  Filled 2014-11-06: qty 5

## 2014-11-06 MED ORDER — HEPARIN SOD (PORK) LOCK FLUSH 100 UNIT/ML IV SOLN
500.0000 [IU] | INTRAVENOUS | Status: DC | PRN
  Filled 2014-11-06: qty 5

## 2014-11-06 MED ORDER — OXYCODONE HCL 5 MG PO TABS: 5.0000 mg | ORAL_TABLET | Freq: Four times a day (QID) | ORAL | Status: DC | PRN

## 2014-11-06 MED ORDER — SODIUM CHLORIDE 0.9 % IJ SOLN
10.0000 mL | INTRAMUSCULAR | Status: DC | PRN
  Administered 2014-11-06: 10 mL via INTRAVENOUS
  Filled 2014-11-06: qty 10

## 2014-11-06 MED ORDER — SODIUM CHLORIDE 0.9 % IJ SOLN
10.0000 mL | INTRAMUSCULAR | Status: AC | PRN
Start: 2014-11-06 — End: 2014-11-06
  Administered 2014-11-06: 10 mL
  Filled 2014-11-06: qty 10

## 2014-11-06 NOTE — Progress Notes (Addendum)
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  PRIOR THERAPY: 1. First-line chemotherapy with 5 cycles of carboplatin (cisplatin for the first cycle) and etoposide from 05/18/2013 to 10/15/2013.  2. WB XRT and palliative chest radiation given on 05/03/13 - 05/18/13 per Dr. Pablo Ledger.  CURRENT THERAPY: Systemic chemotherapy with carboplatin for AUC of 5 on day 1 and etoposide 100 MG/M2 on days 1, 2 and 3 with Neulasta support on day 4. Status post 3 cycles.   INTERVAL HISTORY: Ricky Villanueva 64 y.o. male returns to the clinic today for follow-up visit accompanied by his wife and daughter. Overall the patient has tolerated his systemic chemotherapy fairly well with no significant adverse effects except for mild nausea. He has had pancytopenia and required PRBCs transfusion as well as platelet transfusion after the second cycle of his treatment. He is completing a course of Levaquin for suspected bronchitis. He has been experiencing intermittent episodes of hemoptysis varying degrees He denied having any significant chest pain but has mild shortness of breath. He denied having any vomiting. He has no fever or chills. He does note episodic mid back pain but denied any numbness or tingling. He does note some "clumsiness" with walking and his family members concur with this description He is here today to start cycle #4 of his systemic chemotherapy. He recently had a restaging CT scan of the chest, abdomen and pelvis and is also here to discuss the results.   MEDICAL HISTORY: Past Medical History  Diagnosis Date  . Hyperlipidemia   . Hypertension   . GERD (gastroesophageal reflux disease)   . Respiratory failure with hypoxia 04/21/2013    secondary to pneumonia/notes  04/21/2013  . Pulmonary embolism     "got one in there now" (04/21/2013)  . COPD (chronic obstructive pulmonary disease)   . Pneumonia 2/?01/2014    "wouldn't get better; hospitalized 04/21/2013)  . Arthritis     "minor in my right hand" (04/21/2013)  . History of radiation therapy 05/03/13-05/18/13    chest & whole brain  . Cancer     lung ca dx'd 03/2013  . Colon polyps     hyperplastic  . Right arm pain 08/21/2014    ALLERGIES:  is allergic to decadron and augmentin.  MEDICATIONS:  Current Outpatient Prescriptions  Medication Sig Dispense Refill  . albuterol (PROVENTIL HFA;VENTOLIN HFA) 108 (90 BASE) MCG/ACT inhaler Inhale 2 puffs into the lungs every 6 (six) hours as needed for wheezing or shortness of breath. 1 Inhaler 0  . chlorpheniramine-HYDROcodone (TUSSIONEX PENNKINETIC ER) 10-8 MG/5ML SUER Take 5 mLs by mouth every 12 (twelve) hours as needed for cough. 140 mL 0  . dextromethorphan 15 MG/5ML syrup Take 10 mLs by mouth as needed for cough.    . docusate sodium (COLACE) 100 MG capsule Take 300 mg by mouth 2 (two) times daily as needed for mild constipation.     . Iron-Vitamins (GERITOL PO) Take 1 tablet by mouth daily.    [name] levofloxacin (LEVAQUIN) 500 MG tablet Take 1 tablet (500 mg total) by mouth daily. 7 tablet 0  . lidocaine-prilocaine (EMLA) cream Apply to port site one hour before treatment and cover with plastic wrap. 1 each 2  . LORazepam (ATIVAN) 0.5 MG tablet Take 1 tablet (0.5 mg  total) by mouth 2 (two) times daily. for anxiety 60 tablet 3  . magnesium hydroxide (MILK OF MAGNESIA) 400 MG/5ML suspension Take 5 mLs by mouth daily as needed for mild constipation.    . mirtazapine (REMERON) 30 MG tablet Take 1 tablet (30 mg total) by mouth at bedtime. 90 tablet 1  . morphine (MS CONTIN) 30 MG 12 hr tablet Take 1 tablet (30 mg total) by mouth every 12 (twelve) hours. 60 tablet 0  . omeprazole (PRILOSEC) 40 MG capsule Take 40 mg by mouth daily.     . ondansetron (ZOFRAN) 8 MG  tablet Take 1 tablet (8 mg total) by mouth every 8 (eight) hours as needed for nausea or vomiting (nausea). 20 tablet 3  . oxyCODONE (OXY IR/ROXICODONE) 5 MG immediate release tablet Take 1 tablet (5 mg total) by mouth every 6 (six) hours as needed for severe pain. 120 tablet 0  . polyethylene glycol powder (MIRALAX) powder Take 17 g by mouth daily. (Patient taking differently: Take 17 g by mouth daily as needed for mild constipation or moderate constipation. ) 255 g 0  . prochlorperazine (COMPAZINE) 10 MG tablet Take 1 tablet (10 mg total) by mouth every 6 (six) hours as needed for nausea or vomiting (nausea). 30 tablet 3  . tiotropium (SPIRIVA HANDIHALER) 18 MCG inhalation capsule Place 1 capsule (18 mcg total) into inhaler and inhale daily. 90 capsule 1  . vitamin B-12 (CYANOCOBALAMIN) 100 MCG tablet Take 100 mcg by mouth daily.    Alveda Reasons 20 MG TABS tablet TAKE 1 TABLET (20 MG TOTAL) BY MOUTH DAILY WITH SUPPER. 30 tablet 0   No current facility-administered medications for this visit.   Facility-Administered Medications Ordered in Other Visits  Medication Dose Route Frequency Provider Last Rate Last Dose  . 0.9 %  sodium chloride infusion  1,000 mL Intravenous Once Concha Norway, MD      . 0.9 %  sodium chloride infusion   Intravenous Once Concha Norway, MD      . 0.9 %  sodium chloride infusion  250 mL Intravenous Once Susanne Borders, NP      . acetaminophen (TYLENOL) tablet 650 mg  650 mg Oral Once Susanne Borders, NP      . diphenhydrAMINE (BENADRYL) capsule 25 mg  25 mg Oral Once Susanne Borders, NP        SURGICAL HISTORY:  Past Surgical History  Procedure Laterality Date  . Inguinal hernia repair Right ~ 2005  . Finger surgery Left ~ 1989    "crushed end of my middle finger off"   . Video bronchoscopy Bilateral 04/26/2013    Procedure: VIDEO BRONCHOSCOPY WITH FLUORO;  Surgeon: Wilhelmina Mcardle, MD;  Location: [facility] ENDOSCOPY;  Service: Cardiopulmonary;  Laterality: Bilateral;     REVIEW OF SYSTEMS:  Constitutional: positive for fatigue Eyes: negative Ears, nose, mouth, throat, and face: negative Respiratory: positive for cough and dyspnea on exertion Cardiovascular: negative Gastrointestinal: negative Genitourinary:negative Integument/breast: negative Hematologic/lymphatic: negative Musculoskeletal:negative Neurological: negative Behavioral/Psych: negative Endocrine: negative Allergic/Immunologic: negative   PHYSICAL EXAMINATION: General appearance: alert, cooperative, fatigued and no distress Head: Normocephalic, without obvious abnormality, atraumatic Neck: no adenopathy, no JVD, supple, symmetrical, trachea midline and thyroid not enlarged, symmetric, no tenderness/mass/nodules Lymph nodes: Cervical, supraclavicular, and axillary nodes normal. Resp: clear to auscultation bilaterally Back: symmetric, no curvature. ROM normal. No CVA tenderness. Cardio: regular rate and rhythm, S1, S2 normal, no murmur, click, rub or gallop GI: soft, non-tender; bowel sounds normal; no masses,  no organomegaly Extremities: extremities normal, atraumatic, no cyanosis or edema Neurologic: Alert and oriented X 3, normal strength and tone. Normal symmetric reflexes. Normal coordination and gait  ECOG PERFORMANCE STATUS: 1 - Symptomatic but completely ambulatory  Blood pressure 111/80, pulse 91, temperature 98.2 F (36.8 C), temperature source Oral, resp. rate 18, height '5\' 7"'$  (1.702 m), weight 108 lb 11.2 oz (49.306 kg), SpO2 96 %.  LABORATORY DATA: Lab Results  Component Value Date   WBC 14.0* 11/06/2014   HGB 9.6* 11/06/2014   HCT 28.7* 11/06/2014   MCV 91.9 11/06/2014   PLT 78* 11/06/2014      Chemistry      Component Value Date/Time   NA 140 10/31/2014 0912   NA 139 09/01/2014 1313   K 3.9 10/31/2014 0912   K 4.5 09/01/2014 1313   CL 103 09/01/2014 1313   CO2 31* 10/31/2014 0912   CO2 27 09/01/2014 1313   BUN 11.0 10/31/2014 0912   BUN 11 09/01/2014  1313   CREATININE 0.9 10/31/2014 0912   CREATININE 0.92 09/01/2014 1313      Component Value Date/Time   CALCIUM 8.9 10/31/2014 0912   CALCIUM 9.5 09/01/2014 1313   ALKPHOS 116 10/31/2014 0912   ALKPHOS 67 09/01/2014 1313   AST 15 10/31/2014 0912   AST 20 09/01/2014 1313   ALT 14 10/31/2014 0912   ALT 14* 09/01/2014 1313   BILITOT 0.24 10/31/2014 0912   BILITOT 0.9 09/01/2014 1313       RADIOGRAPHIC STUDIES: Ct Chest W Contrast  11/03/2014   CLINICAL DATA:  Restaging lung cancer diagnosed 2015  EXAM: CT CHEST, ABDOMEN, AND PELVIS WITH CONTRAST  TECHNIQUE: Multidetector CT imaging of the chest, abdomen and pelvis was performed following the standard protocol during bolus administration of intravenous contrast.  CONTRAST:  152m OMNIPAQUE IOHEXOL 300 MG/ML  SOLN  COMPARISON:  CT 09/01/2014  FINDINGS: CT CHEST FINDINGS  Mediastinum/Nodes: No axillary or supraclavicular lymphadenopathy. No mediastinal hilar adenopathy. No pericardial fluid. Coronary calcifications noted. Contrast reflux into the esophagus.  Lungs/Pleura: There is consolidative pattern within the RIGHT lower lobe superimposed on centrilobular emphysema. The density of the consolidation has improved compared to CT 09/01/2014. Additionally, medial RIGHT upper lobe nodularity has resolved along the mediastinum. Discrete 5 mm nodule in the RIGHT upper lobe on image 33, series 4 is new. LEFT lung is clear.  CT ABDOMEN AND PELVIS FINDINGS  Hepatobiliary: 4 mm low-density lesion the RIGHT hepatic lobe is unchanged. Low-density lesion anterior central LEFT hepatic lobe measuring 4 mm image 55 is also unchanged. The common bile duct is dilated similar prior.  Pancreas: Pancreas is normal. No ductal dilatation. No pancreatic inflammation.  Spleen: Normal spleen  Adrenals/urinary tract: Adrenal glands kidneys are normal. There is low-density lesions in the LEFT kidney unchanged. Ureters and bladder normal.  Stomach/Bowel: Stomach, small bowel,  appendix, and cecum are normal. The colon and rectosigmoid colon are normal.  Vascular/Lymphatic: Abdominal aorta is normal caliber with atherosclerotic calcification. There is no retroperitoneal or periportal lymphadenopathy. No pelvic lymphadenopathy.  Reproductive: Prostate normal  Musculoskeletal: New sclerotic lesion in the T10 vertebral body measuring 9 mm on image 41, series 2.  Well-circumscribed sclerotic lesion in the LEFT iliac bone measuring 9 mm is unchanged  Other: No free fluid.  IMPRESSION: Chest Impression:  1. Interval improvemed consolidation in the RIGHT lower lobe suggests resolving infectious or inflammatory process. Recommend attention on follow-up as nodularity does persist. 2. Improvement in nodularity in the medial RIGHT upper  lobe also consistent with resolving infectious or inflammatory process. 3. New 5 pulmonary nodule in the RIGHT upper lobe is indeterminate. Recommend attention on follow-up.  Abdomen / Pelvis Impression:  1. No evidence of metastasis in the soft tissues of the abdomen or pelvis. 2. NEW SCLEROTIC LESION WITHIN THE T10 VERTEBRAL BODY IS CONSISTENT WITH SKELETAL METASTASIS.   Electronically Signed   By: Suzy Bouchard M.D.   On: 11/03/2014 09:58   Ct Abdomen Pelvis W Contrast  11/03/2014   CLINICAL DATA:  Restaging lung cancer diagnosed 2015  EXAM: CT CHEST, ABDOMEN, AND PELVIS WITH CONTRAST  TECHNIQUE: Multidetector CT imaging of the chest, abdomen and pelvis was performed following the standard protocol during bolus administration of intravenous contrast.  CONTRAST:  148m OMNIPAQUE IOHEXOL 300 MG/ML  SOLN  COMPARISON:  CT 09/01/2014  FINDINGS: CT CHEST FINDINGS  Mediastinum/Nodes: No axillary or supraclavicular lymphadenopathy. No mediastinal hilar adenopathy. No pericardial fluid. Coronary calcifications noted. Contrast reflux into the esophagus.  Lungs/Pleura: There is consolidative pattern within the RIGHT lower lobe superimposed on centrilobular emphysema. The  density of the consolidation has improved compared to CT 09/01/2014. Additionally, medial RIGHT upper lobe nodularity has resolved along the mediastinum. Discrete 5 mm nodule in the RIGHT upper lobe on image 33, series 4 is new. LEFT lung is clear.  CT ABDOMEN AND PELVIS FINDINGS  Hepatobiliary: 4 mm low-density lesion the RIGHT hepatic lobe is unchanged. Low-density lesion anterior central LEFT hepatic lobe measuring 4 mm image 55 is also unchanged. The common bile duct is dilated similar prior.  Pancreas: Pancreas is normal. No ductal dilatation. No pancreatic inflammation.  Spleen: Normal spleen  Adrenals/urinary tract: Adrenal glands kidneys are normal. There is low-density lesions in the LEFT kidney unchanged. Ureters and bladder normal.  Stomach/Bowel: Stomach, small bowel, appendix, and cecum are normal. The colon and rectosigmoid colon are normal.  Vascular/Lymphatic: Abdominal aorta is normal caliber with atherosclerotic calcification. There is no retroperitoneal or periportal lymphadenopathy. No pelvic lymphadenopathy.  Reproductive: Prostate normal  Musculoskeletal: New sclerotic lesion in the T10 vertebral body measuring 9 mm on image 41, series 2.  Well-circumscribed sclerotic lesion in the LEFT iliac bone measuring 9 mm is unchanged  Other: No free fluid.  IMPRESSION: Chest Impression:  1. Interval improvemed consolidation in the RIGHT lower lobe suggests resolving infectious or inflammatory process. Recommend attention on follow-up as nodularity does persist. 2. Improvement in nodularity in the medial RIGHT upper lobe also consistent with resolving infectious or inflammatory process. 3. New 5 pulmonary nodule in the RIGHT upper lobe is indeterminate. Recommend attention on follow-up.  Abdomen / Pelvis Impression:  1. No evidence of metastasis in the soft tissues of the abdomen or pelvis. 2. NEW SCLEROTIC LESION WITHIN THE T10 VERTEBRAL BODY IS CONSISTENT WITH SKELETAL METASTASIS.   Electronically  Signed   By: SSuzy BouchardM.D.   On: 11/03/2014 09:58    ASSESSMENT AND PLAN: This is a very pleasant 64years old white male with extensive stage small cell lung cancer status post palliative radiotherapy to the brain and chest as well as 6 cycles of systemic chemotherapy with carboplatin and etoposide completed in August 2015. The patient has been observation for the last 10 months. The recent CT scan of the chest, abdomen and pelvis showed evidence for disease progression with multiple ill-defined soft tissue masses in the right lower lobe and an enlarging subdiaphragmatic lymph nodes and a new suspicious hepatic metastasis. He is currently on systemic chemotherapy with carboplatin and etoposide,  status post 3 cycles and is tolerating his treatment fairly well. His recent restaging CT scan overall showed response to therapy. There was however a new 5 mm pulmonary nodule in the right upper lobe is indeterminate and we will monitor this area closely on subsequent restaging CT scans. There was a new sclerotic lesion at T10 consistent with skeletal metastasis. She reports episodic mid back pain. I will refer him to radiation oncology for review of this lesions appropriateness for radiation therapy. His platelet count is too low to proceed with chemotherapy today as scheduled. He will follow-up in one week with repeat labs and proceed with cycle #4 of his chemotherapy as scheduled. His hemoptysis is likely secondary to his disease burden as well as his lower platelet count. We will continue to monitor this closely on subsequent visits. Patient and his restaging CT scan reviewed with Dr. [name] was in agreement with this plan. Patient's MS Contin, oxycodone and Remeron prescriptions were all refilled today. He will continue weekly labs as scheduled and follow-up in one month prior to the start of cycle #5 The patient was advised to call if he has any concerning symptoms in the interval. The patient voices  understanding of current disease status and treatment options and is in agreement with the current care plan.  All questions were answered. The patient knows to call the clinic with any problems, questions or concerns. We can certainly see the patient much sooner if necessary.  Carlton Adam, PA-C 11/06/2014   Disclaimer: This note was dictated with voice recognition software. Similar sounding words can inadvertently be transcribed and may not be corrected upon review.

## 2014-11-06 NOTE — Patient Instructions (Signed)
Overall your CT scan showed some improvement in your disease. There is a new bone metastasis on your spine and your being referred to radiation oncology for review of this area for its appropriateness for possible radiation therapy. Continue weekly labs as scheduled Your platelets are too low to proceed with cycle #4 today as scheduled. Return in one week for repeat labs and proceed with cycle #5 with your labs are within treatable ranges. Follow-up in 4 weeks prior to the start of cycle #5

## 2014-11-06 NOTE — Patient Instructions (Signed)

## 2014-11-07 ENCOUNTER — Ambulatory Visit: Payer: BLUE CROSS/BLUE SHIELD

## 2014-11-08 ENCOUNTER — Ambulatory Visit: Payer: BLUE CROSS/BLUE SHIELD

## 2014-11-10 ENCOUNTER — Ambulatory Visit: Payer: BLUE CROSS/BLUE SHIELD

## 2014-11-13 ENCOUNTER — Ambulatory Visit: Payer: BLUE CROSS/BLUE SHIELD

## 2014-11-13 ENCOUNTER — Ambulatory Visit (HOSPITAL_BASED_OUTPATIENT_CLINIC_OR_DEPARTMENT_OTHER): Payer: BLUE CROSS/BLUE SHIELD

## 2014-11-13 ENCOUNTER — Other Ambulatory Visit (HOSPITAL_BASED_OUTPATIENT_CLINIC_OR_DEPARTMENT_OTHER): Payer: BLUE CROSS/BLUE SHIELD

## 2014-11-13 ENCOUNTER — Other Ambulatory Visit: Payer: BLUE CROSS/BLUE SHIELD

## 2014-11-13 VITALS — BP 138/90 | HR 72 | Temp 97.6°F | Resp 16

## 2014-11-13 DIAGNOSIS — C7B8 Other secondary neuroendocrine tumors: Secondary | ICD-10-CM

## 2014-11-13 DIAGNOSIS — C3490 Malignant neoplasm of unspecified part of unspecified bronchus or lung: Secondary | ICD-10-CM

## 2014-11-13 DIAGNOSIS — C3491 Malignant neoplasm of unspecified part of right bronchus or lung: Secondary | ICD-10-CM

## 2014-11-13 DIAGNOSIS — C7A1 Malignant poorly differentiated neuroendocrine tumors: Secondary | ICD-10-CM | POA: Diagnosis not present

## 2014-11-13 DIAGNOSIS — M79601 Pain in right arm: Secondary | ICD-10-CM

## 2014-11-13 LAB — CBC WITH DIFFERENTIAL/PLATELET
BASO%: 0.1 % (ref 0.0–2.0)
BASOS ABS: 0 10*3/uL (ref 0.0–0.1)
EOS%: 0.4 % (ref 0.0–7.0)
Eosinophils Absolute: 0.1 10*3/uL (ref 0.0–0.5)
HCT: 30.5 % — ABNORMAL LOW (ref 38.4–49.9)
HEMOGLOBIN: 10.2 g/dL — AB (ref 13.0–17.1)
LYMPH#: 0.8 10*3/uL — AB (ref 0.9–3.3)
LYMPH%: 5.7 % — ABNORMAL LOW (ref 14.0–49.0)
MCH: 31.5 pg (ref 27.2–33.4)
MCHC: 33.5 g/dL (ref 32.0–36.0)
MCV: 94.2 fL (ref 79.3–98.0)
MONO#: 1.6 10*3/uL — ABNORMAL HIGH (ref 0.1–0.9)
MONO%: 12.4 % (ref 0.0–14.0)
NEUT#: 10.8 10*3/uL — ABNORMAL HIGH (ref 1.5–6.5)
NEUT%: 81.4 % — ABNORMAL HIGH (ref 39.0–75.0)
Platelets: 283 10*3/uL (ref 140–400)
RBC: 3.24 10*6/uL — ABNORMAL LOW (ref 4.20–5.82)
RDW: 13.7 % (ref 11.0–14.6)
WBC: 13.3 10*3/uL — AB (ref 4.0–10.3)

## 2014-11-13 LAB — COMPREHENSIVE METABOLIC PANEL (CC13)
ALBUMIN: 3.2 g/dL — AB (ref 3.5–5.0)
ALT: 10 U/L (ref 0–55)
AST: 15 U/L (ref 5–34)
Alkaline Phosphatase: 100 U/L (ref 40–150)
Anion Gap: 6 mEq/L (ref 3–11)
BUN: 8.8 mg/dL (ref 7.0–26.0)
CHLORIDE: 102 meq/L (ref 98–109)
CO2: 31 mEq/L — ABNORMAL HIGH (ref 22–29)
CREATININE: 0.9 mg/dL (ref 0.7–1.3)
Calcium: 9.4 mg/dL (ref 8.4–10.4)
EGFR: >90 mL/min/{1.73_m2} (ref >90)
GLUCOSE: 100 mg/dL (ref 70–140)
POTASSIUM: 4.4 meq/L (ref 3.5–5.1)
SODIUM: 139 meq/L (ref 136–145)
Total Bilirubin: 0.22 mg/dL (ref 0.20–1.20)
Total Protein: 6.8 g/dL (ref 6.4–8.3)

## 2014-11-13 MED ORDER — SODIUM CHLORIDE 0.9 % IV SOLN
Freq: Once | INTRAVENOUS | Status: AC
  Administered 2014-11-13: 16:00:00 via INTRAVENOUS
  Filled 2014-11-13: qty 8

## 2014-11-13 MED ORDER — SODIUM CHLORIDE 0.9 % IV SOLN
Freq: Once | INTRAVENOUS | Status: AC
  Administered 2014-11-13: 14:00:00 via INTRAVENOUS

## 2014-11-13 MED ORDER — SODIUM CHLORIDE 0.9 % IV SOLN
100.0000 mg/m2 | Freq: Once | INTRAVENOUS | Status: AC
  Administered 2014-11-13: 150 mg via INTRAVENOUS
  Filled 2014-11-13: qty 7.5

## 2014-11-13 MED ORDER — SODIUM CHLORIDE 0.9 % IV SOLN
420.0000 mg | Freq: Once | INTRAVENOUS | Status: AC
  Administered 2014-11-13: 420 mg via INTRAVENOUS
  Filled 2014-11-13: qty 42

## 2014-11-13 MED ORDER — CARBOPLATIN CHEMO INTRADERMAL TEST DOSE 100MCG/0.02ML
100.0000 ug | Freq: Once | INTRADERMAL | Status: AC
  Administered 2014-11-13: 100 ug via INTRADERMAL
  Filled 2014-11-13: qty 0.01

## 2014-11-13 MED ORDER — SODIUM CHLORIDE 0.9 % IJ SOLN
10.0000 mL | INTRAMUSCULAR | Status: DC | PRN
  Administered 2014-11-13: 10 mL via INTRAVENOUS
  Filled 2014-11-13: qty 10

## 2014-11-13 MED ORDER — HEPARIN SOD (PORK) LOCK FLUSH 100 UNIT/ML IV SOLN
500.0000 [IU] | Freq: Once | INTRAVENOUS | Status: AC | PRN
  Administered 2014-11-13: 500 [IU]
  Filled 2014-11-13: qty 5

## 2014-11-13 MED ORDER — SODIUM CHLORIDE 0.9 % IJ SOLN
10.0000 mL | INTRAMUSCULAR | Status: DC | PRN
  Administered 2014-11-13: 10 mL
  Filled 2014-11-13: qty 10

## 2014-11-13 NOTE — Patient Instructions (Signed)
[facility] Discharge Instructions for Patients Receiving Chemotherapy  Today you received the following chemotherapy agents carboplatin, etoposide  To help prevent nausea and vomiting after your treatment, we encourage you to take your nausea medication   If you develop nausea and vomiting that is not controlled by your nausea medication, call the clinic.   BELOW ARE SYMPTOMS THAT SHOULD BE REPORTED IMMEDIATELY:  *FEVER GREATER THAN 100.5 F  *CHILLS WITH OR WITHOUT FEVER  NAUSEA AND VOMITING THAT IS NOT CONTROLLED WITH YOUR NAUSEA MEDICATION  *UNUSUAL SHORTNESS OF BREATH  *UNUSUAL BRUISING OR BLEEDING  TENDERNESS IN MOUTH AND THROAT WITH OR WITHOUT PRESENCE OF ULCERS  *URINARY PROBLEMS  *BOWEL PROBLEMS  UNUSUAL RASH Items with * indicate a potential emergency and should be followed up as soon as possible.  Feel free to call the clinic you have any questions or concerns. The clinic phone number is [phone].  Please show the [facility] at check-in to the Emergency Department and triage nurse.

## 2014-11-14 ENCOUNTER — Ambulatory Visit (HOSPITAL_BASED_OUTPATIENT_CLINIC_OR_DEPARTMENT_OTHER): Payer: BLUE CROSS/BLUE SHIELD

## 2014-11-14 VITALS — BP 130/57 | HR 75 | Temp 97.0°F | Resp 16

## 2014-11-14 DIAGNOSIS — Z5111 Encounter for antineoplastic chemotherapy: Secondary | ICD-10-CM

## 2014-11-14 DIAGNOSIS — C7A1 Malignant poorly differentiated neuroendocrine tumors: Secondary | ICD-10-CM

## 2014-11-14 DIAGNOSIS — C7B8 Other secondary neuroendocrine tumors: Secondary | ICD-10-CM

## 2014-11-14 DIAGNOSIS — C3491 Malignant neoplasm of unspecified part of right bronchus or lung: Secondary | ICD-10-CM

## 2014-11-14 MED ORDER — HEPARIN SOD (PORK) LOCK FLUSH 100 UNIT/ML IV SOLN
500.0000 [IU] | Freq: Once | INTRAVENOUS | Status: AC | PRN
  Administered 2014-11-14: 500 [IU]
  Filled 2014-11-14: qty 5

## 2014-11-14 MED ORDER — ETOPOSIDE CHEMO INJECTION 1 GM/50ML
100.0000 mg/m2 | Freq: Once | INTRAVENOUS | Status: AC
  Administered 2014-11-14: 150 mg via INTRAVENOUS
  Filled 2014-11-14: qty 7.5

## 2014-11-14 MED ORDER — PROCHLORPERAZINE MALEATE 10 MG PO TABS
ORAL_TABLET | ORAL | Status: AC
  Filled 2014-11-14: qty 1

## 2014-11-14 MED ORDER — PROCHLORPERAZINE MALEATE 10 MG PO TABS
10.0000 mg | ORAL_TABLET | Freq: Once | ORAL | Status: AC
  Administered 2014-11-14: 10 mg via ORAL

## 2014-11-14 MED ORDER — SODIUM CHLORIDE 0.9 % IV SOLN
Freq: Once | INTRAVENOUS | Status: AC
  Administered 2014-11-14: 10:00:00 via INTRAVENOUS

## 2014-11-14 MED ORDER — SODIUM CHLORIDE 0.9 % IJ SOLN
10.0000 mL | INTRAMUSCULAR | Status: DC | PRN
  Administered 2014-11-14: 10 mL
  Filled 2014-11-14: qty 10

## 2014-11-14 NOTE — Progress Notes (Signed)
Patient on O2 at 2L/min

## 2014-11-14 NOTE — Patient Instructions (Signed)
[facility] Discharge Instructions for Patients Receiving Chemotherapy  Today you received the following chemotherapy agents VP 16 (Etoposide) To help prevent nausea and vomiting after your treatment, we encourage you to take your nausea medication as prescribed.   If you develop nausea and vomiting that is not controlled by your nausea medication, call the clinic.   BELOW ARE SYMPTOMS THAT SHOULD BE REPORTED IMMEDIATELY:  *FEVER GREATER THAN 100.5 F  *CHILLS WITH OR WITHOUT FEVER  NAUSEA AND VOMITING THAT IS NOT CONTROLLED WITH YOUR NAUSEA MEDICATION  *UNUSUAL SHORTNESS OF BREATH  *UNUSUAL BRUISING OR BLEEDING  TENDERNESS IN MOUTH AND THROAT WITH OR WITHOUT PRESENCE OF ULCERS  *URINARY PROBLEMS  *BOWEL PROBLEMS  UNUSUAL RASH Items with * indicate a potential emergency and should be followed up as soon as possible.  Feel free to call the clinic you have any questions or concerns. The clinic phone number is [phone].  Please show the [facility] at check-in to the Emergency Department and triage nurse.

## 2014-11-15 ENCOUNTER — Telehealth: Payer: Self-pay | Admitting: Medical Oncology

## 2014-11-15 ENCOUNTER — Other Ambulatory Visit: Payer: Self-pay | Admitting: Medical Oncology

## 2014-11-15 ENCOUNTER — Ambulatory Visit (HOSPITAL_BASED_OUTPATIENT_CLINIC_OR_DEPARTMENT_OTHER): Payer: BLUE CROSS/BLUE SHIELD

## 2014-11-15 VITALS — BP 131/93 | HR 92 | Temp 97.6°F | Resp 16

## 2014-11-15 DIAGNOSIS — C3490 Malignant neoplasm of unspecified part of unspecified bronchus or lung: Secondary | ICD-10-CM

## 2014-11-15 DIAGNOSIS — C7A1 Malignant poorly differentiated neuroendocrine tumors: Secondary | ICD-10-CM | POA: Diagnosis not present

## 2014-11-15 DIAGNOSIS — C3491 Malignant neoplasm of unspecified part of right bronchus or lung: Secondary | ICD-10-CM

## 2014-11-15 DIAGNOSIS — C7B8 Other secondary neuroendocrine tumors: Secondary | ICD-10-CM

## 2014-11-15 DIAGNOSIS — Z5111 Encounter for antineoplastic chemotherapy: Secondary | ICD-10-CM | POA: Diagnosis not present

## 2014-11-15 DIAGNOSIS — R918 Other nonspecific abnormal finding of lung field: Secondary | ICD-10-CM

## 2014-11-15 DIAGNOSIS — K1230 Oral mucositis (ulcerative), unspecified: Secondary | ICD-10-CM

## 2014-11-15 DIAGNOSIS — M545 Low back pain, unspecified: Secondary | ICD-10-CM

## 2014-11-15 MED ORDER — SODIUM CHLORIDE 0.9 % IV SOLN
100.0000 mg/m2 | Freq: Once | INTRAVENOUS | Status: AC
  Administered 2014-11-15: 150 mg via INTRAVENOUS
  Filled 2014-11-15: qty 7.5

## 2014-11-15 MED ORDER — RIVAROXABAN 20 MG PO TABS: ORAL_TABLET | ORAL | Status: DC

## 2014-11-15 MED ORDER — SODIUM CHLORIDE 0.9 % IJ SOLN
10.0000 mL | INTRAMUSCULAR | Status: DC | PRN
  Administered 2014-11-15: 10 mL
  Filled 2014-11-15: qty 10

## 2014-11-15 MED ORDER — ONDANSETRON HCL 8 MG PO TABS: 8.0000 mg | ORAL_TABLET | Freq: Three times a day (TID) | ORAL | Status: DC | PRN

## 2014-11-15 MED ORDER — ALBUTEROL SULFATE HFA 108 (90 BASE) MCG/ACT IN AERS: 2.0000 | INHALATION_SPRAY | Freq: Four times a day (QID) | RESPIRATORY_TRACT | Status: DC | PRN

## 2014-11-15 MED ORDER — SODIUM CHLORIDE 0.9 % IV SOLN
Freq: Once | INTRAVENOUS | Status: AC
  Administered 2014-11-15: 09:00:00 via INTRAVENOUS

## 2014-11-15 MED ORDER — PROCHLORPERAZINE MALEATE 10 MG PO TABS
ORAL_TABLET | ORAL | Status: AC
  Filled 2014-11-15: qty 1

## 2014-11-15 MED ORDER — PROCHLORPERAZINE MALEATE 10 MG PO TABS
10.0000 mg | ORAL_TABLET | Freq: Once | ORAL | Status: AC
  Administered 2014-11-15: 10 mg via ORAL

## 2014-11-15 MED ORDER — LORAZEPAM 0.5 MG PO TABS: 0.5000 mg | ORAL_TABLET | Freq: Two times a day (BID) | ORAL | Status: DC

## 2014-11-15 MED ORDER — HEPARIN SOD (PORK) LOCK FLUSH 100 UNIT/ML IV SOLN
500.0000 [IU] | Freq: Once | INTRAVENOUS | Status: AC | PRN
  Administered 2014-11-15: 500 [IU]
  Filled 2014-11-15: qty 5

## 2014-11-15 NOTE — Progress Notes (Signed)
Department of Radiation Oncology  Phone:  [phone] Fax:        [phone]   Name: Ricky Villanueva MRN: [id number]  DOB: 01-Jun-1950  Date: 11/16/2014  Follow Up Visit Note  Diagnosis: Extensive Stage Small Cell Lung Cancer  Summary and Interval since last radiation: 05/03/2013-05/18/2013 Site/dose:   Chest and whole brain to 30 Gy in 12 fractions at 2.5 Gy per fraction  Interval History: Ricky Villanueva presents today for routine followup. The pt reports 5/10 mid-back pain "where a bra-strap would be" that radiates to his chest. He takes ms cotin bid and oxycodone prn. Continues on carboplatin and etoposide. This has been complicated with anemia and thrombocytopenia for which he has received blood and platelet transfusions. The pt's most recent CT scan was on 11/03/14 and it found a new sclerotic lesion in the T10 vertebral body consistent with skeletal metastasis and a new pulmonary nodule, but improvement in the right lower lobe process. He is scheduled for a few more rounds of chemotehrapy. He believes his pain is well controlled at this time on his current regiment. He reports good energy and appetite and is gaining weight.  Allergies:  Allergies  Allergen Reactions  . Decadron [Dexamethasone] Other (See Comments)    Slurred speech  . Augmentin [Amoxicillin-Pot Clavulanate] Itching and Rash    rash    Medications:  Current Outpatient Prescriptions  Medication Sig Dispense Refill  . chlorpheniramine-HYDROcodone (TUSSIONEX PENNKINETIC ER) 10-8 MG/5ML SUER Take 5 mLs by mouth every 12 (twelve) hours as needed for cough. 140 mL 0  . dextromethorphan 15 MG/5ML syrup Take 10 mLs by mouth as needed for cough.    . docusate sodium (COLACE) 100 MG capsule Take 300 mg by mouth 2 (two) times daily as needed for mild constipation.     . Iron-Vitamins (GERITOL PO) Take 1 tablet by mouth daily.    [name] lidocaine-prilocaine (EMLA) cream Apply to port site one hour before treatment and cover with plastic  wrap. 1 each 2  . LORazepam (ATIVAN) 0.5 MG tablet Take 0.5 mg by mouth 2 (two) times daily as needed.     . magnesium hydroxide (MILK OF MAGNESIA) 400 MG/5ML suspension Take 5 mLs by mouth daily as needed for mild constipation.    . mirtazapine (REMERON) 30 MG tablet Take 1 tablet (30 mg total) by mouth at bedtime. 90 tablet 1  . morphine (MS CONTIN) 30 MG 12 hr tablet Take 1 tablet (30 mg total) by mouth every 12 (twelve) hours. 60 tablet 0  . omeprazole (PRILOSEC) 40 MG capsule Take 40 mg by mouth daily.     . ondansetron (ZOFRAN) 8 MG tablet Take 8 mg by mouth every 8 (eight) hours as needed.    [name] oxyCODONE (OXY IR/ROXICODONE) 5 MG immediate release tablet Take 1 tablet (5 mg total) by mouth every 6 (six) hours as needed for severe pain. 120 tablet 0  . polyethylene glycol powder (MIRALAX) powder Take 17 g by mouth daily. (Patient taking differently: Take 17 g by mouth daily as needed for mild constipation or moderate constipation. ) 255 g 0  . prochlorperazine (COMPAZINE) 10 MG tablet Take 1 tablet (10 mg total) by mouth every 6 (six) hours as needed for nausea or vomiting (nausea). 30 tablet 3  . tiotropium (SPIRIVA HANDIHALER) 18 MCG inhalation capsule Place 1 capsule (18 mcg total) into inhaler and inhale daily. 90 capsule 1  . VENTOLIN HFA 108 (90 BASE) MCG/ACT inhaler Inhale 2 puffs into the lungs  every 6 (six) hours as needed.     . vitamin B-12 (CYANOCOBALAMIN) 100 MCG tablet Take 100 mcg by mouth daily.    Alveda Reasons 20 MG TABS tablet Take 20 mg by mouth daily.    [name] levofloxacin (LEVAQUIN) 500 MG tablet Take 1 tablet (500 mg total) by mouth daily. (Patient not taking: Reported on 11/16/2014) 7 tablet 0   No current facility-administered medications for this encounter.   Facility-Administered Medications Ordered in Other Encounters  Medication Dose Route Frequency Provider Last Rate Last Dose  . 0.9 %  sodium chloride infusion  1,000 mL Intravenous Once Concha Norway, MD      . 0.9 %   sodium chloride infusion   Intravenous Once Concha Norway, MD      . 0.9 %  sodium chloride infusion  250 mL Intravenous Once Susanne Borders, NP      . acetaminophen (TYLENOL) tablet 650 mg  650 mg Oral Once Susanne Borders, NP      . diphenhydrAMINE (BENADRYL) capsule 25 mg  25 mg Oral Once Susanne Borders, NP        Physical Exam:  Filed Vitals:   11/16/14 1025  BP: 113/88  Pulse: 91  Temp: 97.7 F (36.5 C)  Resp: 20  Weight: 111 lb 4.8 oz (50.485 kg)  SpO2: 94%  Pleasant, alert, awake, and not wearing his oxygen. Doesn't appear to be in pain.  IMPRESSION: Ricky Villanueva is a 64 y.o. male with a history of small cell lung cancer with back pain. I don't belive his pain is caused by this new lesion at T10. His discription doesn't fit with boney met disease.   PLAN: The patient is not interested in radiation at this time knowing the dysphagia that may occur. We agreed to continue chemotherapy for now. His wife will contact me if his pain worsens and if we need to start radiation. He will continue f/u with Dr. [name] and I will see him back as needed.  This document serves as a record of services personally performed by [name] , MD. It was created on her behalf by Pearlie Oyster, a trained medical scribe. The creation of this record is based on the scribe's personal observations and the provider's statements to them. This document has been checked and approved by the attending provider.     [name], MD

## 2014-11-15 NOTE — Progress Notes (Signed)
Pt has appointments at [facility] on 9/22. Order sent to scheduler to move injection appointment to 9/22, he will have injection after RadOnc appointments.

## 2014-11-15 NOTE — Telephone Encounter (Signed)
Per wife request [facility], xarelto , ventolin and ativan. She requests all refills go to [facility] -

## 2014-11-15 NOTE — Progress Notes (Signed)
[facility], xarelto and ventolin. [facility] stated refill requests came from pt. I called wife and she said pt did not request refills.refills cancelled at [facility]

## 2014-11-15 NOTE — Patient Instructions (Signed)
[facility] Discharge Instructions for Patients Receiving Chemotherapy  Today you received the following chemotherapy agents: Etoposide.  To help prevent nausea and vomiting after your treatment, we encourage you to take your nausea medication: Compazine. Take one every 6 hours as needed.   If you develop nausea and vomiting that is not controlled by your nausea medication, call the clinic.   BELOW ARE SYMPTOMS THAT SHOULD BE REPORTED IMMEDIATELY:  *FEVER GREATER THAN 100.5 F  *CHILLS WITH OR WITHOUT FEVER  NAUSEA AND VOMITING THAT IS NOT CONTROLLED WITH YOUR NAUSEA MEDICATION  *UNUSUAL SHORTNESS OF BREATH  *UNUSUAL BRUISING OR BLEEDING  TENDERNESS IN MOUTH AND THROAT WITH OR WITHOUT PRESENCE OF ULCERS  *URINARY PROBLEMS  *BOWEL PROBLEMS  UNUSUAL RASH Items with * indicate a potential emergency and should be followed up as soon as possible.  Feel free to call the clinic should you have any questions or concerns. The clinic phone number is [phone].  Please show the CHEMO ALERT CARD at check-in to the Emergency Department and triage nurse.   

## 2014-11-16 ENCOUNTER — Telehealth: Payer: Self-pay | Admitting: Medical Oncology

## 2014-11-16 ENCOUNTER — Ambulatory Visit (HOSPITAL_BASED_OUTPATIENT_CLINIC_OR_DEPARTMENT_OTHER): Payer: BLUE CROSS/BLUE SHIELD

## 2014-11-16 ENCOUNTER — Ambulatory Visit
Admission: RE | Admit: 2014-11-16 | Discharge: 2014-11-16 | Disposition: A | Discharge status: Home / Self Care | Payer: BLUE CROSS/BLUE SHIELD | Source: Ambulatory Visit | Attending: Radiation Oncology | Admitting: Radiation Oncology

## 2014-11-16 VITALS — BP 124/80 | HR 102 | Temp 97.7°F

## 2014-11-16 VITALS — BP 113/88 | HR 91 | Temp 97.7°F | Resp 20 | Wt 111.3 lb

## 2014-11-16 DIAGNOSIS — C7A1 Malignant poorly differentiated neuroendocrine tumors: Secondary | ICD-10-CM | POA: Diagnosis not present

## 2014-11-16 DIAGNOSIS — M549 Dorsalgia, unspecified: Secondary | ICD-10-CM | POA: Diagnosis not present

## 2014-11-16 DIAGNOSIS — Z452 Encounter for adjustment and management of vascular access device: Secondary | ICD-10-CM

## 2014-11-16 DIAGNOSIS — C7B8 Other secondary neuroendocrine tumors: Secondary | ICD-10-CM

## 2014-11-16 DIAGNOSIS — C3490 Malignant neoplasm of unspecified part of unspecified bronchus or lung: Secondary | ICD-10-CM

## 2014-11-16 DIAGNOSIS — Z79899 Other long term (current) drug therapy: Secondary | ICD-10-CM | POA: Diagnosis not present

## 2014-11-16 DIAGNOSIS — K219 Gastro-esophageal reflux disease without esophagitis: Secondary | ICD-10-CM

## 2014-11-16 DIAGNOSIS — C3491 Malignant neoplasm of unspecified part of right bronchus or lung: Secondary | ICD-10-CM

## 2014-11-16 DIAGNOSIS — M899 Disorder of bone, unspecified: Secondary | ICD-10-CM | POA: Insufficient documentation

## 2014-11-16 DIAGNOSIS — C3492 Malignant neoplasm of unspecified part of left bronchus or lung: Secondary | ICD-10-CM

## 2014-11-16 MED ORDER — OMEPRAZOLE 40 MG PO CPDR: 40.0000 mg | DELAYED_RELEASE_CAPSULE | Freq: Every day | ORAL | Status: DC

## 2014-11-16 MED ORDER — PEGFILGRASTIM INJECTION 6 MG/0.6ML ~~LOC~~
6.0000 mg | PREFILLED_SYRINGE | Freq: Once | SUBCUTANEOUS | Status: AC
  Administered 2014-11-16: 6 mg via SUBCUTANEOUS
  Filled 2014-11-16: qty 0.6

## 2014-11-16 MED ORDER — HYDROCOD POLST-CPM POLST ER 10-8 MG/5ML PO SUER: 5.0000 mL | Freq: Two times a day (BID) | ORAL | Status: DC | PRN

## 2014-11-16 NOTE — Progress Notes (Signed)
Thoracic Location of Tumor / Histology:New 5 mm pulmonary nodule in right upper lobe and new sclerotic lesion at T10  Patient presented disease progression with defined soft tissue masses in right lower lobe and enlarging subdiaphragmatic lymph nodes and new suspicious hepatic metastasis.  Biopsie  Past/Anticipated interventions by cardiothoracic surgery, if any:   Past/Anticipated interventions by medical oncology, if JOA:CZYSAYTK carboplatin and etoposide  With 05/18/13-10/15/13.  Signs/Symptoms Weight changes, if any: Wt Readings from Last 3 Encounters:  11/16/14 111 lb 4.8 oz (50.485 kg)  11/06/14 108 lb 11.2 oz (49.306 kg)  11/03/14 111 lb 9.6 oz (50.621 kg)    Respiratory complaints, if any wears oxygen mainly at bedtime or long walks.  Hemoptysis, if any:Did have hemoptysis.using tussin cough medication  Pain issues, if ZSW:FUXNA pain. And some discomfort behind eyes.  SAFETY ISSUES:  Prior radiation? Yes 05/03/13-05/18/13 chest  Pacemaker/ICD? No   Possible current pregnancy?N/A  Is the patient on methotrexate? No  Current Complaints / other details: BP 113/88 mmHg  Pulse 91  Temp(Src) 97.7 F (36.5 C)  Resp 20  Wt 111 lb 4.8 oz (50.485 kg)  SpO2 94%

## 2014-11-16 NOTE — Addendum Note (Signed)
Encounter addended by: Norm Salt, RN on: 11/16/2014  2:26 PM<BR>     Documentation filed: Charges VN

## 2014-11-16 NOTE — Telephone Encounter (Signed)
Requests refill omperazole to [facility]-had been getting it [facility].-Done

## 2014-11-17 ENCOUNTER — Ambulatory Visit: Payer: BLUE CROSS/BLUE SHIELD

## 2014-11-20 ENCOUNTER — Other Ambulatory Visit (HOSPITAL_BASED_OUTPATIENT_CLINIC_OR_DEPARTMENT_OTHER): Payer: BLUE CROSS/BLUE SHIELD

## 2014-11-20 ENCOUNTER — Other Ambulatory Visit: Payer: Self-pay | Admitting: Internal Medicine

## 2014-11-20 ENCOUNTER — Other Ambulatory Visit: Payer: Self-pay | Admitting: Medical Oncology

## 2014-11-20 DIAGNOSIS — C7B8 Other secondary neuroendocrine tumors: Secondary | ICD-10-CM | POA: Diagnosis not present

## 2014-11-20 DIAGNOSIS — C7A1 Malignant poorly differentiated neuroendocrine tumors: Secondary | ICD-10-CM | POA: Diagnosis not present

## 2014-11-20 DIAGNOSIS — C3491 Malignant neoplasm of unspecified part of right bronchus or lung: Secondary | ICD-10-CM

## 2014-11-20 DIAGNOSIS — M79601 Pain in right arm: Secondary | ICD-10-CM

## 2014-11-20 DIAGNOSIS — Z95828 Presence of other vascular implants and grafts: Secondary | ICD-10-CM

## 2014-11-20 LAB — COMPREHENSIVE METABOLIC PANEL (CC13)
ALT: 15 U/L (ref 0–55)
AST: 21 U/L (ref 5–34)
Albumin: 3.3 g/dL — ABNORMAL LOW (ref 3.5–5.0)
Alkaline Phosphatase: 150 U/L (ref 40–150)
Anion Gap: 7 mEq/L (ref 3–11)
BUN: 13.4 mg/dL (ref 7.0–26.0)
CHLORIDE: 102 meq/L (ref 98–109)
CO2: 30 mEq/L — ABNORMAL HIGH (ref 22–29)
Calcium: 9.1 mg/dL (ref 8.4–10.4)
Creatinine: 0.8 mg/dL (ref 0.7–1.3)
EGFR: >90 mL/min/{1.73_m2} (ref >90)
GLUCOSE: 130 mg/dL (ref 70–140)
POTASSIUM: 4.4 meq/L (ref 3.5–5.1)
SODIUM: 139 meq/L (ref 136–145)
Total Bilirubin: 0.72 mg/dL (ref 0.20–1.20)
Total Protein: 6.6 g/dL (ref 6.4–8.3)

## 2014-11-20 LAB — CBC WITH DIFFERENTIAL/PLATELET
BASO%: 0.3 % (ref 0.0–2.0)
BASOS ABS: 0.1 10*3/uL (ref 0.0–0.1)
EOS%: 0.1 % (ref 0.0–7.0)
Eosinophils Absolute: 0 10*3/uL (ref 0.0–0.5)
HCT: 29.5 % — ABNORMAL LOW (ref 38.4–49.9)
HGB: 9.6 g/dL — ABNORMAL LOW (ref 13.0–17.1)
LYMPH%: 2.6 % — ABNORMAL LOW (ref 14.0–49.0)
MCH: 31.5 pg (ref 27.2–33.4)
MCHC: 32.5 g/dL (ref 32.0–36.0)
MCV: 96.9 fL (ref 79.3–98.0)
MONO#: 0.2 10*3/uL (ref 0.1–0.9)
MONO%: 1.1 % (ref 0.0–14.0)
NEUT#: 18.5 10*3/uL — ABNORMAL HIGH (ref 1.5–6.5)
NEUT%: 95.9 % — AB (ref 39.0–75.0)
Platelets: 96 10*3/uL — ABNORMAL LOW (ref 140–400)
RBC: 3.04 10*6/uL — AB (ref 4.20–5.82)
RDW: 17.1 % — ABNORMAL HIGH (ref 11.0–14.6)
WBC: 19.3 10*3/uL — ABNORMAL HIGH (ref 4.0–10.3)
lymph#: 0.5 10*3/uL — ABNORMAL LOW (ref 0.9–3.3)

## 2014-11-20 MED ORDER — SODIUM CHLORIDE 0.9 % IJ SOLN
10.0000 mL | INTRAMUSCULAR | Status: DC | PRN
  Filled 2014-11-20: qty 10

## 2014-11-20 MED ORDER — HEPARIN SOD (PORK) LOCK FLUSH 100 UNIT/ML IV SOLN
500.0000 [IU] | Freq: Once | INTRAVENOUS | Status: DC
  Filled 2014-11-20: qty 5

## 2014-11-21 ENCOUNTER — Encounter: Payer: Self-pay | Admitting: *Deleted

## 2014-11-27 ENCOUNTER — Ambulatory Visit (HOSPITAL_COMMUNITY)
Admission: RE | Admit: 2014-11-27 | Discharge: 2014-11-27 | Disposition: A | Discharge status: Home / Self Care | Payer: BLUE CROSS/BLUE SHIELD | Source: Ambulatory Visit | Attending: Internal Medicine | Admitting: Internal Medicine

## 2014-11-27 ENCOUNTER — Ambulatory Visit (HOSPITAL_BASED_OUTPATIENT_CLINIC_OR_DEPARTMENT_OTHER): Payer: BLUE CROSS/BLUE SHIELD

## 2014-11-27 ENCOUNTER — Other Ambulatory Visit: Payer: Self-pay | Admitting: Medical Oncology

## 2014-11-27 ENCOUNTER — Ambulatory Visit: Payer: BLUE CROSS/BLUE SHIELD

## 2014-11-27 ENCOUNTER — Other Ambulatory Visit (HOSPITAL_BASED_OUTPATIENT_CLINIC_OR_DEPARTMENT_OTHER): Payer: BLUE CROSS/BLUE SHIELD

## 2014-11-27 VITALS — BP 110/76 | HR 64 | Temp 97.8°F | Resp 18

## 2014-11-27 DIAGNOSIS — C3491 Malignant neoplasm of unspecified part of right bronchus or lung: Secondary | ICD-10-CM

## 2014-11-27 DIAGNOSIS — T451X5A Adverse effect of antineoplastic and immunosuppressive drugs, initial encounter: Secondary | ICD-10-CM | POA: Insufficient documentation

## 2014-11-27 DIAGNOSIS — C7A1 Malignant poorly differentiated neuroendocrine tumors: Secondary | ICD-10-CM | POA: Diagnosis not present

## 2014-11-27 DIAGNOSIS — M79601 Pain in right arm: Secondary | ICD-10-CM

## 2014-11-27 DIAGNOSIS — Z95828 Presence of other vascular implants and grafts: Secondary | ICD-10-CM

## 2014-11-27 DIAGNOSIS — D6481 Anemia due to antineoplastic chemotherapy: Secondary | ICD-10-CM

## 2014-11-27 DIAGNOSIS — C7B8 Other secondary neuroendocrine tumors: Secondary | ICD-10-CM

## 2014-11-27 DIAGNOSIS — D696 Thrombocytopenia, unspecified: Secondary | ICD-10-CM

## 2014-11-27 LAB — CBC WITH DIFFERENTIAL/PLATELET
BASO%: 0 % (ref 0.0–2.0)
Basophils Absolute: 0 10*3/uL (ref 0.0–0.1)
EOS%: 0.5 % (ref 0.0–7.0)
Eosinophils Absolute: 0 10*3/uL (ref 0.0–0.5)
HCT: 19.1 % — ABNORMAL LOW (ref 38.4–49.9)
HEMOGLOBIN: 6.5 g/dL — AB (ref 13.0–17.1)
LYMPH%: 15 % (ref 14.0–49.0)
MCH: 31.6 pg (ref 27.2–33.4)
MCHC: 34 g/dL (ref 32.0–36.0)
MCV: 92.7 fL (ref 79.3–98.0)
MONO#: 0.3 10*3/uL (ref 0.1–0.9)
MONO%: 7.9 % (ref 0.0–14.0)
NEUT%: 76.6 % — ABNORMAL HIGH (ref 39.0–75.0)
NEUTROS ABS: 2.8 10*3/uL (ref 1.5–6.5)
Platelets: 4 10*3/uL — CL (ref 140–400)
RBC: 2.06 10*6/uL — ABNORMAL LOW (ref 4.20–5.82)
RDW: 14.7 % — AB (ref 11.0–14.6)
WBC: 3.7 10*3/uL — AB (ref 4.0–10.3)
lymph#: 0.6 10*3/uL — ABNORMAL LOW (ref 0.9–3.3)

## 2014-11-27 LAB — COMPREHENSIVE METABOLIC PANEL (CC13)
ALBUMIN: 3.3 g/dL — AB (ref 3.5–5.0)
ALK PHOS: 93 U/L (ref 40–150)
ALT: 11 U/L (ref 0–55)
AST: 13 U/L (ref 5–34)
Anion Gap: 6 mEq/L (ref 3–11)
BUN: 12.8 mg/dL (ref 7.0–26.0)
CO2: 30 mEq/L — ABNORMAL HIGH (ref 22–29)
Calcium: 9.1 mg/dL (ref 8.4–10.4)
Chloride: 103 mEq/L (ref 98–109)
Creatinine: 0.8 mg/dL (ref 0.7–1.3)
EGFR: >90 mL/min/{1.73_m2} (ref >90)
GLUCOSE: 123 mg/dL (ref 70–140)
POTASSIUM: 4 meq/L (ref 3.5–5.1)
SODIUM: 140 meq/L (ref 136–145)
TOTAL PROTEIN: 6.3 g/dL — AB (ref 6.4–8.3)

## 2014-11-27 LAB — PREPARE RBC (CROSSMATCH)

## 2014-11-27 MED ORDER — ACETAMINOPHEN 325 MG PO TABS
650.0000 mg | ORAL_TABLET | Freq: Once | ORAL | Status: AC
  Administered 2014-11-27: 650 mg via ORAL
  Filled 2014-11-27: qty 2

## 2014-11-27 MED ORDER — SODIUM CHLORIDE 0.9 % IV SOLN: 250.0000 mL | Freq: Once | INTRAVENOUS | Status: DC

## 2014-11-27 MED ORDER — ACETAMINOPHEN 325 MG PO TABS: 650.0000 mg | ORAL_TABLET | Freq: Once | ORAL | Status: DC

## 2014-11-27 MED ORDER — HEPARIN SOD (PORK) LOCK FLUSH 100 UNIT/ML IV SOLN
500.0000 [IU] | Freq: Once | INTRAVENOUS | Status: DC
  Filled 2014-11-27: qty 5

## 2014-11-27 MED ORDER — HEPARIN SOD (PORK) LOCK FLUSH 100 UNIT/ML IV SOLN
500.0000 [IU] | Freq: Every day | INTRAVENOUS | Status: AC | PRN
  Administered 2014-11-27: 500 [IU]
  Filled 2014-11-27: qty 5

## 2014-11-27 MED ORDER — SODIUM CHLORIDE 0.9 % IJ SOLN
10.0000 mL | INTRAMUSCULAR | Status: AC | PRN
  Administered 2014-11-27: 10 mL

## 2014-11-27 MED ORDER — DIPHENHYDRAMINE HCL 25 MG PO CAPS
25.0000 mg | ORAL_CAPSULE | Freq: Once | ORAL | Status: AC
  Administered 2014-11-27: 25 mg via ORAL
  Filled 2014-11-27: qty 1

## 2014-11-27 MED ORDER — DIPHENHYDRAMINE HCL 25 MG PO CAPS: 25.0000 mg | ORAL_CAPSULE | Freq: Once | ORAL | Status: DC

## 2014-11-27 MED ORDER — SODIUM CHLORIDE 0.9 % IJ SOLN: 10.0000 mL | INTRAMUSCULAR | Status: DC | PRN

## 2014-11-27 MED ORDER — SODIUM CHLORIDE 0.9 % IJ SOLN
10.0000 mL | INTRAMUSCULAR | Status: DC | PRN
  Administered 2014-11-27: 10 mL via INTRAVENOUS
  Filled 2014-11-27: qty 10

## 2014-11-27 NOTE — Patient Instructions (Signed)

## 2014-11-27 NOTE — Progress Notes (Signed)
har done and wife notified of appt

## 2014-11-27 NOTE — Progress Notes (Signed)
Diagnosis Association: Thrombocytopenia (HCC) (287.5)  MD:  Curt Bears  Procedure: Pt received 1 pack of platelets and 2 units of PRBC via porta cath.  Condition during procedure: Pt tolerated well.  Condition post procedure: Pt alert oriented and ambulatory. Porta cath flushed and de accessed per protocol.

## 2014-11-28 ENCOUNTER — Other Ambulatory Visit: Payer: Self-pay | Admitting: Internal Medicine

## 2014-11-28 LAB — TYPE AND SCREEN
ABO/RH(D): A POS
Antibody Screen: NEGATIVE
UNIT DIVISION: 0
UNIT DIVISION: 0

## 2014-11-28 LAB — PREPARE PLATELET PHERESIS: UNIT DIVISION: 0

## 2014-12-04 ENCOUNTER — Telehealth: Payer: Self-pay | Admitting: Internal Medicine

## 2014-12-04 ENCOUNTER — Encounter: Payer: Self-pay | Admitting: Internal Medicine

## 2014-12-04 ENCOUNTER — Ambulatory Visit (HOSPITAL_BASED_OUTPATIENT_CLINIC_OR_DEPARTMENT_OTHER): Payer: BLUE CROSS/BLUE SHIELD | Admitting: Internal Medicine

## 2014-12-04 ENCOUNTER — Ambulatory Visit: Payer: BLUE CROSS/BLUE SHIELD

## 2014-12-04 ENCOUNTER — Other Ambulatory Visit (HOSPITAL_BASED_OUTPATIENT_CLINIC_OR_DEPARTMENT_OTHER): Payer: BLUE CROSS/BLUE SHIELD

## 2014-12-04 VITALS — BP 113/90 | HR 97 | Temp 98.0°F | Resp 17 | Ht 67.0 in | Wt 109.7 lb

## 2014-12-04 DIAGNOSIS — Z7901 Long term (current) use of anticoagulants: Secondary | ICD-10-CM

## 2014-12-04 DIAGNOSIS — C3410 Malignant neoplasm of upper lobe, unspecified bronchus or lung: Secondary | ICD-10-CM

## 2014-12-04 DIAGNOSIS — D61810 Antineoplastic chemotherapy induced pancytopenia: Secondary | ICD-10-CM

## 2014-12-04 DIAGNOSIS — C3411 Malignant neoplasm of upper lobe, right bronchus or lung: Secondary | ICD-10-CM

## 2014-12-04 DIAGNOSIS — C7931 Secondary malignant neoplasm of brain: Secondary | ICD-10-CM

## 2014-12-04 DIAGNOSIS — C3491 Malignant neoplasm of unspecified part of right bronchus or lung: Secondary | ICD-10-CM

## 2014-12-04 DIAGNOSIS — R531 Weakness: Secondary | ICD-10-CM | POA: Diagnosis not present

## 2014-12-04 DIAGNOSIS — M79601 Pain in right arm: Secondary | ICD-10-CM

## 2014-12-04 DIAGNOSIS — T451X5A Adverse effect of antineoplastic and immunosuppressive drugs, initial encounter: Secondary | ICD-10-CM

## 2014-12-04 DIAGNOSIS — R530 Neoplastic (malignant) related fatigue: Secondary | ICD-10-CM

## 2014-12-04 DIAGNOSIS — Z86718 Personal history of other venous thrombosis and embolism: Secondary | ICD-10-CM

## 2014-12-04 LAB — CBC WITH DIFFERENTIAL/PLATELET
BASO%: 0.9 % (ref 0.0–2.0)
Basophils Absolute: 0.1 10*3/uL (ref 0.0–0.1)
EOS ABS: 0 10*3/uL (ref 0.0–0.5)
EOS%: 0.2 % (ref 0.0–7.0)
HEMATOCRIT: 31.2 % — AB (ref 38.4–49.9)
HEMOGLOBIN: 10.5 g/dL — AB (ref 13.0–17.1)
LYMPH#: 0.5 10*3/uL — AB (ref 0.9–3.3)
LYMPH%: 6.7 % — ABNORMAL LOW (ref 14.0–49.0)
MCH: 30.8 pg (ref 27.2–33.4)
MCHC: 33.5 g/dL (ref 32.0–36.0)
MCV: 92 fL (ref 79.3–98.0)
MONO#: 0.7 10*3/uL (ref 0.1–0.9)
MONO%: 8.9 % (ref 0.0–14.0)
NEUT%: 83.3 % — ABNORMAL HIGH (ref 39.0–75.0)
NEUTROS ABS: 6.4 10*3/uL (ref 1.5–6.5)
PLATELETS: 30 10*3/uL — AB (ref 140–400)
RBC: 3.4 10*6/uL — ABNORMAL LOW (ref 4.20–5.82)
RDW: 14.1 % (ref 11.0–14.6)
WBC: 7.6 10*3/uL (ref 4.0–10.3)

## 2014-12-04 LAB — COMPREHENSIVE METABOLIC PANEL (CC13)
ALBUMIN: 3.4 g/dL — AB (ref 3.5–5.0)
ALK PHOS: 118 U/L (ref 40–150)
ALT: 13 U/L (ref 0–55)
ANION GAP: 7 meq/L (ref 3–11)
AST: 14 U/L (ref 5–34)
BILIRUBIN TOTAL: 0.43 mg/dL (ref 0.20–1.20)
BUN: 11.3 mg/dL (ref 7.0–26.0)
CALCIUM: 9.3 mg/dL (ref 8.4–10.4)
CO2: 30 meq/L — AB (ref 22–29)
CREATININE: 0.8 mg/dL (ref 0.7–1.3)
Chloride: 102 mEq/L (ref 98–109)
EGFR: >90 mL/min/{1.73_m2} (ref >90)
Glucose: 129 mg/dl (ref 70–140)
Potassium: 3.9 mEq/L (ref 3.5–5.1)
Sodium: 139 mEq/L (ref 136–145)
TOTAL PROTEIN: 6.8 g/dL (ref 6.4–8.3)

## 2014-12-04 MED ORDER — SODIUM CHLORIDE 0.9 % IJ SOLN
10.0000 mL | INTRAMUSCULAR | Status: DC | PRN
  Administered 2014-12-04: 10 mL via INTRAVENOUS
  Filled 2014-12-04: qty 10

## 2014-12-04 MED ORDER — HEPARIN SOD (PORK) LOCK FLUSH 100 UNIT/ML IV SOLN
500.0000 [IU] | Freq: Once | INTRAVENOUS | Status: AC
  Administered 2014-12-04: 500 [IU] via INTRAVENOUS
  Filled 2014-12-04: qty 5

## 2014-12-04 NOTE — Telephone Encounter (Signed)
s.w. pt wife and advised on OCT appt....ok and aware °

## 2014-12-04 NOTE — Progress Notes (Signed)
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  PRIOR THERAPY: 1. First-line chemotherapy with 5 cycles of carboplatin (cisplatin for the first cycle) and etoposide from 05/18/2013 to 10/15/2013.  2. WB XRT and palliative chest radiation given on 05/03/13 - 05/18/13 per Dr. Pablo Ledger.  CURRENT THERAPY: Systemic chemotherapy with carboplatin for AUC of 5 on day 1 and etoposide 100 MG/M2 on days 1, 2 and 3 with Neulasta support on day 4. Status post 4 cycles.   INTERVAL HISTORY: Ricky Villanueva 64 y.o. male returns to the clinic today for follow-up visit accompanied by his wife and daughter. The patient had significant pancytopenia after cycle #4 and required PRBCs transfusion as well as platelet transfusion last week. He denied having any significant chest pain but has mild shortness of breath. He denied having any vomiting. He has no fever or chills. He also has some dizzy spells and falls before the transfusion last week. He continues to complain of increasing fatigue and weakness in his lower extremities. He has no chest pain, shortness breath, cough or hemoptysis. He was supposed to start cycle #5 today.  MEDICAL HISTORY: Past Medical History  Diagnosis Date  . Hyperlipidemia   . Hypertension   . GERD (gastroesophageal reflux disease)   . Respiratory failure with hypoxia 04/21/2013    secondary to pneumonia/notes 04/21/2013  . Pulmonary embolism     "got one in there now" (04/21/2013)  . COPD (chronic obstructive pulmonary disease)   . Pneumonia 2/?01/2014    "wouldn't get better; hospitalized 04/21/2013)  . Arthritis     "minor in my right hand" (04/21/2013)  . History of radiation therapy 05/03/13-05/18/13    chest & whole brain  . Cancer     lung ca dx'd  03/2013  . Colon polyps     hyperplastic  . Right arm pain 08/21/2014    ALLERGIES:  is allergic to decadron and augmentin.  MEDICATIONS:  Current Outpatient Prescriptions  Medication Sig Dispense Refill  . chlorpheniramine-HYDROcodone (TUSSIONEX PENNKINETIC ER) 10-8 MG/5ML SUER Take 5 mLs by mouth every 12 (twelve) hours as needed for cough. 140 mL 0  . dextromethorphan 15 MG/5ML syrup Take 10 mLs by mouth as needed for cough.    . docusate sodium (COLACE) 100 MG capsule Take 300 mg by mouth 2 (two) times daily as needed for mild constipation.     . Iron-Vitamins (GERITOL PO) Take 1 tablet by mouth daily.    [name] levofloxacin (LEVAQUIN) 500 MG tablet Take 1 tablet (500 mg total) by mouth daily. (Patient not taking: Reported on 11/16/2014) 7 tablet 0  . lidocaine-prilocaine (EMLA) cream Apply to port site one hour before treatment and cover with plastic wrap. 1 each 2  . LORazepam (ATIVAN) 0.5 MG tablet Take 0.5 mg by mouth 2 (two) times daily as needed.     . magnesium hydroxide (MILK OF MAGNESIA) 400 MG/5ML suspension Take 5 mLs by mouth daily as needed for mild constipation.    . mirtazapine (REMERON) 30 MG tablet Take 1 tablet (30 mg total) by mouth at bedtime. 90 tablet 1  . morphine (MS CONTIN) 30 MG 12 hr tablet Take 1 tablet (30 mg total) by mouth every 12 (twelve) hours. 60 tablet 0  . omeprazole (PRILOSEC) 40 MG capsule  Take 1 capsule (40 mg total) by mouth daily. 30 capsule 0  . ondansetron (ZOFRAN) 8 MG tablet Take 8 mg by mouth every 8 (eight) hours as needed.    [name] oxyCODONE (OXY IR/ROXICODONE) 5 MG immediate release tablet Take 1 tablet (5 mg total) by mouth every 6 (six) hours as needed for severe pain. 120 tablet 0  . polyethylene glycol powder (MIRALAX) powder Take 17 g by mouth daily. (Patient taking differently: Take 17 g by mouth daily as needed for mild constipation or moderate constipation. ) 255 g 0  . PROAIR HFA 108 (90 BASE) MCG/ACT inhaler USE 2 INHALATIONS EVERY 6 HOURS  AS NEEDED FOR WHEEZING OR SHORTNESS OF BREATH 8.5 g 1  . prochlorperazine (COMPAZINE) 10 MG tablet Take 1 tablet (10 mg total) by mouth every 6 (six) hours as needed for nausea or vomiting (nausea). 30 tablet 3  . tiotropium (SPIRIVA HANDIHALER) 18 MCG inhalation capsule Place 1 capsule (18 mcg total) into inhaler and inhale daily. 90 capsule 1  . VENTOLIN HFA 108 (90 BASE) MCG/ACT inhaler Inhale 2 puffs into the lungs every 6 (six) hours as needed.     . vitamin B-12 (CYANOCOBALAMIN) 100 MCG tablet Take 100 mcg by mouth daily.    Alveda Reasons 20 MG TABS tablet TAKE 1 TABLET (20 MG TOTAL) BY MOUTH DAILY WITH SUPPER. 30 tablet 0   No current facility-administered medications for this visit.   Facility-Administered Medications Ordered in Other Visits  Medication Dose Route Frequency Provider Last Rate Last Dose  . 0.9 %  sodium chloride infusion  1,000 mL Intravenous Once Concha Norway, MD      . 0.9 %  sodium chloride infusion   Intravenous Once Concha Norway, MD        SURGICAL HISTORY:  Past Surgical History  Procedure Laterality Date  . Inguinal hernia repair Right ~ 2005  . Finger surgery Left ~ 1989    "crushed end of my middle finger off"   . Video bronchoscopy Bilateral 04/26/2013    Procedure: VIDEO BRONCHOSCOPY WITH FLUORO;  Surgeon: Wilhelmina Mcardle, MD;  Location: [facility] ENDOSCOPY;  Service: Cardiopulmonary;  Laterality: Bilateral;    REVIEW OF SYSTEMS:  Constitutional: positive for fatigue Eyes: negative Ears, nose, mouth, throat, and face: negative Respiratory: negative Cardiovascular: negative Gastrointestinal: negative Genitourinary:negative Integument/breast: negative Hematologic/lymphatic: negative Musculoskeletal:positive for muscle weakness Neurological: positive for dizziness Behavioral/Psych: negative Endocrine: negative Allergic/Immunologic: negative   PHYSICAL EXAMINATION: General appearance: alert, cooperative, fatigued and no distress Head: Normocephalic, without  obvious abnormality, atraumatic Neck: no adenopathy, no JVD, supple, symmetrical, trachea midline and thyroid not enlarged, symmetric, no tenderness/mass/nodules Lymph nodes: Cervical, supraclavicular, and axillary nodes normal. Resp: clear to auscultation bilaterally Back: symmetric, no curvature. ROM normal. No CVA tenderness. Cardio: regular rate and rhythm, S1, S2 normal, no murmur, click, rub or gallop GI: soft, non-tender; bowel sounds normal; no masses,  no organomegaly Extremities: extremities normal, atraumatic, no cyanosis or edema Neurologic: Alert and oriented X 3, normal strength and tone. Normal symmetric reflexes. Normal coordination and gait  ECOG PERFORMANCE STATUS: 1 - Symptomatic but completely ambulatory  Blood pressure 113/90, pulse 97, temperature 98 F (36.7 C), temperature source Oral, resp. rate 17, height '5\' 7"'$  (1.702 m), weight 109 lb 11.2 oz (49.76 kg), SpO2 95 %.  LABORATORY DATA: Lab Results  Component Value Date   WBC 7.6 12/04/2014   HGB 10.5* 12/04/2014   HCT 31.2* 12/04/2014   MCV 92.0 12/04/2014   PLT 30* 12/04/2014  Chemistry      Component Value Date/Time   NA 140 11/27/2014 0922   NA 139 09/01/2014 1313   K 4.0 11/27/2014 0922   K 4.5 09/01/2014 1313   CL 103 09/01/2014 1313   CO2 30* 11/27/2014 0922   CO2 27 09/01/2014 1313   BUN 12.8 11/27/2014 0922   BUN 11 09/01/2014 1313   CREATININE 0.8 11/27/2014 0922   CREATININE 0.92 09/01/2014 1313      Component Value Date/Time   CALCIUM 9.1 11/27/2014 0922   CALCIUM 9.5 09/01/2014 1313   ALKPHOS 93 11/27/2014 0922   ALKPHOS 67 09/01/2014 1313   AST 13 11/27/2014 0922   AST 20 09/01/2014 1313   ALT 11 11/27/2014 0922   ALT 14* 09/01/2014 1313   BILITOT <0.30 11/27/2014 0922   BILITOT 0.9 09/01/2014 1313       RADIOGRAPHIC STUDIES: No results found.  ASSESSMENT AND PLAN: This is a very pleasant 64 years old white male with extensive stage small cell lung cancer status post  palliative radiotherapy to the brain and chest as well as 6 cycles of systemic chemotherapy with carboplatin and etoposide completed in August 2015. The patient has been observation for around 10 months. Repeat CT scan of the chest, abdomen and pelvis showed evidence for disease progression with multiple ill-defined soft tissue masses in the right lower lobe and an enlarging subdiaphragmatic lymph nodes and a new suspicious hepatic metastasis. He is currently on systemic chemotherapy with carboplatin and etoposide, status post 4 cycles and tolerated the second dose of his treatment fairly well except for the pancytopenia associated with weakness and fatigue.  He received PRBCs and platelet transfusion last week. His hemoglobin is better. His platelets count are still low at 30,000. I recommended for the patient to delay the start of cycle #2 by 2 weeks to give him more time for recovery. He would come back for follow-up visit for reevaluation at that time. For the history of deep venous thrombosis, he will continue his treatment with Xarelto. The patient was advised to call if he has any concerning symptoms in the interval. The patient voices understanding of current disease status and treatment options and is in agreement with the current care plan.  All questions were answered. The patient knows to call the clinic with any problems, questions or concerns. We can certainly see the patient much sooner if necessary.  Disclaimer: This note was dictated with voice recognition software. Similar sounding words can inadvertently be transcribed and may not be corrected upon review.

## 2014-12-05 ENCOUNTER — Ambulatory Visit: Payer: BLUE CROSS/BLUE SHIELD

## 2014-12-06 ENCOUNTER — Ambulatory Visit: Payer: BLUE CROSS/BLUE SHIELD

## 2014-12-06 ENCOUNTER — Telehealth: Payer: Self-pay | Admitting: *Deleted

## 2014-12-06 DIAGNOSIS — C3490 Malignant neoplasm of unspecified part of unspecified bronchus or lung: Secondary | ICD-10-CM

## 2014-12-06 NOTE — Telephone Encounter (Signed)
Pt's wife called [employer] requesting simply go mini to carry Oxygen unit. Call placed to [facility] with request order entered and faxed to [phone]

## 2014-12-08 ENCOUNTER — Ambulatory Visit: Payer: BLUE CROSS/BLUE SHIELD

## 2014-12-11 ENCOUNTER — Other Ambulatory Visit: Payer: Self-pay | Admitting: Medical Oncology

## 2014-12-11 ENCOUNTER — Telehealth: Payer: Self-pay | Admitting: *Deleted

## 2014-12-11 ENCOUNTER — Other Ambulatory Visit: Payer: BLUE CROSS/BLUE SHIELD

## 2014-12-11 DIAGNOSIS — R918 Other nonspecific abnormal finding of lung field: Secondary | ICD-10-CM

## 2014-12-11 DIAGNOSIS — M545 Low back pain, unspecified: Secondary | ICD-10-CM

## 2014-12-11 DIAGNOSIS — K1230 Oral mucositis (ulcerative), unspecified: Secondary | ICD-10-CM

## 2014-12-11 DIAGNOSIS — C3490 Malignant neoplasm of unspecified part of unspecified bronchus or lung: Secondary | ICD-10-CM

## 2014-12-11 MED ORDER — OXYCODONE HCL 5 MG PO TABS: 5.0000 mg | ORAL_TABLET | Freq: Four times a day (QID) | ORAL | Status: DC | PRN

## 2014-12-11 MED ORDER — MORPHINE SULFATE ER 30 MG PO TBCR: 30.0000 mg | EXTENDED_RELEASE_TABLET | Freq: Two times a day (BID) | ORAL | Status: DC

## 2014-12-11 NOTE — Progress Notes (Signed)
Pain meds ready for pick up wife notified.

## 2014-12-11 NOTE — Telephone Encounter (Signed)
Patient's wife called requesting refill for Morphine and oxycodone.  Return number when ready for pick up is [phone].

## 2014-12-12 NOTE — Telephone Encounter (Signed)
Prescriptions prepared by [facility] nurse on 12-11-2014.

## 2014-12-13 ENCOUNTER — Other Ambulatory Visit: Payer: Self-pay | Admitting: Internal Medicine

## 2014-12-18 ENCOUNTER — Other Ambulatory Visit: Payer: Self-pay | Admitting: *Deleted

## 2014-12-18 ENCOUNTER — Telehealth: Payer: Self-pay | Admitting: Internal Medicine

## 2014-12-18 ENCOUNTER — Encounter: Payer: Self-pay | Admitting: Oncology

## 2014-12-18 ENCOUNTER — Ambulatory Visit (HOSPITAL_BASED_OUTPATIENT_CLINIC_OR_DEPARTMENT_OTHER): Payer: BLUE CROSS/BLUE SHIELD | Admitting: Lab

## 2014-12-18 ENCOUNTER — Ambulatory Visit (HOSPITAL_BASED_OUTPATIENT_CLINIC_OR_DEPARTMENT_OTHER): Payer: BLUE CROSS/BLUE SHIELD

## 2014-12-18 ENCOUNTER — Ambulatory Visit: Payer: BLUE CROSS/BLUE SHIELD

## 2014-12-18 ENCOUNTER — Ambulatory Visit (HOSPITAL_BASED_OUTPATIENT_CLINIC_OR_DEPARTMENT_OTHER): Payer: BLUE CROSS/BLUE SHIELD | Admitting: Oncology

## 2014-12-18 ENCOUNTER — Other Ambulatory Visit: Payer: BLUE CROSS/BLUE SHIELD

## 2014-12-18 VITALS — BP 101/78 | HR 92 | Temp 97.5°F | Resp 17 | Ht 67.0 in | Wt 109.2 lb

## 2014-12-18 DIAGNOSIS — Z5111 Encounter for antineoplastic chemotherapy: Secondary | ICD-10-CM | POA: Diagnosis not present

## 2014-12-18 DIAGNOSIS — R599 Enlarged lymph nodes, unspecified: Secondary | ICD-10-CM

## 2014-12-18 DIAGNOSIS — R0602 Shortness of breath: Secondary | ICD-10-CM

## 2014-12-18 DIAGNOSIS — C3491 Malignant neoplasm of unspecified part of right bronchus or lung: Secondary | ICD-10-CM

## 2014-12-18 DIAGNOSIS — C3490 Malignant neoplasm of unspecified part of unspecified bronchus or lung: Secondary | ICD-10-CM

## 2014-12-18 DIAGNOSIS — C7931 Secondary malignant neoplasm of brain: Secondary | ICD-10-CM | POA: Diagnosis not present

## 2014-12-18 DIAGNOSIS — Z86718 Personal history of other venous thrombosis and embolism: Secondary | ICD-10-CM

## 2014-12-18 DIAGNOSIS — D61818 Other pancytopenia: Secondary | ICD-10-CM | POA: Diagnosis not present

## 2014-12-18 LAB — COMPREHENSIVE METABOLIC PANEL (CC13)
ALT: 13 U/L (ref 0–55)
ANION GAP: 7 meq/L (ref 3–11)
AST: 17 U/L (ref 5–34)
Albumin: 3.3 g/dL — ABNORMAL LOW (ref 3.5–5.0)
Alkaline Phosphatase: 88 U/L (ref 40–150)
BUN: 9.5 mg/dL (ref 7.0–26.0)
CALCIUM: 9.4 mg/dL (ref 8.4–10.4)
CHLORIDE: 103 meq/L (ref 98–109)
CO2: 29 meq/L (ref 22–29)
CREATININE: 0.8 mg/dL (ref 0.7–1.3)
Glucose: 119 mg/dl (ref 70–140)
POTASSIUM: 4 meq/L (ref 3.5–5.1)
Sodium: 139 mEq/L (ref 136–145)
Total Bilirubin: 0.3 mg/dL (ref 0.20–1.20)
Total Protein: 6.8 g/dL (ref 6.4–8.3)

## 2014-12-18 LAB — CBC WITH DIFFERENTIAL/PLATELET
BASO%: 0.3 % (ref 0.0–2.0)
BASOS ABS: 0 10*3/uL (ref 0.0–0.1)
EOS%: 0.3 % (ref 0.0–7.0)
Eosinophils Absolute: 0 10*3/uL (ref 0.0–0.5)
HEMATOCRIT: 33.7 % — AB (ref 38.4–49.9)
HGB: 11.5 g/dL — ABNORMAL LOW (ref 13.0–17.1)
LYMPH#: 0.6 10*3/uL — AB (ref 0.9–3.3)
LYMPH%: 8.8 % — AB (ref 14.0–49.0)
MCH: 33.2 pg (ref 27.2–33.4)
MCHC: 34.1 g/dL (ref 32.0–36.0)
MCV: 97.3 fL (ref 79.3–98.0)
MONO#: 0.7 10*3/uL (ref 0.1–0.9)
MONO%: 10.8 % (ref 0.0–14.0)
NEUT#: 5 10*3/uL (ref 1.5–6.5)
NEUT%: 79.8 % — AB (ref 39.0–75.0)
PLATELETS: 307 10*3/uL (ref 140–400)
RBC: 3.47 10*6/uL — AB (ref 4.20–5.82)
RDW: 19.6 % — ABNORMAL HIGH (ref 11.0–14.6)
WBC: 6.3 10*3/uL (ref 4.0–10.3)

## 2014-12-18 MED ORDER — HEPARIN SOD (PORK) LOCK FLUSH 100 UNIT/ML IV SOLN
500.0000 [IU] | Freq: Once | INTRAVENOUS | Status: AC | PRN
  Administered 2014-12-18: 500 [IU]
  Filled 2014-12-18: qty 5

## 2014-12-18 MED ORDER — CARBOPLATIN CHEMO INJECTION 450 MG/45ML
364.0000 mg | Freq: Once | INTRAVENOUS | Status: AC
  Administered 2014-12-18: 360 mg via INTRAVENOUS
  Filled 2014-12-18: qty 36

## 2014-12-18 MED ORDER — HEPARIN SOD (PORK) LOCK FLUSH 100 UNIT/ML IV SOLN
500.0000 [IU] | Freq: Once | INTRAVENOUS | Status: DC
  Filled 2014-12-18: qty 5

## 2014-12-18 MED ORDER — SODIUM CHLORIDE 0.9 % IV SOLN
Freq: Once | INTRAVENOUS | Status: AC
  Administered 2014-12-18: 11:00:00 via INTRAVENOUS

## 2014-12-18 MED ORDER — ETOPOSIDE CHEMO INJECTION 1 GM/50ML
80.0000 mg/m2 | Freq: Once | INTRAVENOUS | Status: AC
  Administered 2014-12-18: 120 mg via INTRAVENOUS
  Filled 2014-12-18: qty 6

## 2014-12-18 MED ORDER — ALTEPLASE 2 MG IJ SOLR
2.0000 mg | Freq: Once | INTRAMUSCULAR | Status: DC | PRN
  Filled 2014-12-18: qty 2

## 2014-12-18 MED ORDER — SODIUM CHLORIDE 0.9 % IJ SOLN
10.0000 mL | INTRAMUSCULAR | Status: DC | PRN
  Administered 2014-12-18: 10 mL via INTRAVENOUS
  Filled 2014-12-18: qty 10

## 2014-12-18 MED ORDER — SODIUM CHLORIDE 0.9 % IJ SOLN
10.0000 mL | Freq: Once | INTRAMUSCULAR | Status: DC
  Filled 2014-12-18: qty 10

## 2014-12-18 MED ORDER — SODIUM CHLORIDE 0.9 % IV SOLN
Freq: Once | INTRAVENOUS | Status: AC
  Administered 2014-12-18: 12:00:00 via INTRAVENOUS
  Filled 2014-12-18: qty 8

## 2014-12-18 MED ORDER — SODIUM CHLORIDE 0.9 % IJ SOLN
10.0000 mL | INTRAMUSCULAR | Status: DC | PRN
  Administered 2014-12-18: 10 mL
  Filled 2014-12-18: qty 10

## 2014-12-18 MED ORDER — CARBOPLATIN CHEMO INTRADERMAL TEST DOSE 100MCG/0.02ML
100.0000 ug | Freq: Once | INTRADERMAL | Status: AC
  Administered 2014-12-18: 100 ug via INTRADERMAL
  Filled 2014-12-18: qty 0.01

## 2014-12-18 NOTE — Patient Instructions (Signed)
[facility] Discharge Instructions for Patients Receiving Chemotherapy  Today you received the following chemotherapy agents Carboplatin and Etoposide  To help prevent nausea and vomiting after your treatment, we encourage you to take your nausea medication as directed If you develop nausea and vomiting that is not controlled by your nausea medication, call the clinic.   BELOW ARE SYMPTOMS THAT SHOULD BE REPORTED IMMEDIATELY:  *FEVER GREATER THAN 100.5 F  *CHILLS WITH OR WITHOUT FEVER  NAUSEA AND VOMITING THAT IS NOT CONTROLLED WITH YOUR NAUSEA MEDICATION  *UNUSUAL SHORTNESS OF BREATH  *UNUSUAL BRUISING OR BLEEDING  TENDERNESS IN MOUTH AND THROAT WITH OR WITHOUT PRESENCE OF ULCERS  *URINARY PROBLEMS  *BOWEL PROBLEMS  UNUSUAL RASH Items with * indicate a potential emergency and should be followed up as soon as possible.  Feel free to call the clinic you have any questions or concerns. The clinic phone number is [phone].  Please show the CHEMO ALERT CARD at check-in to the Emergency Department and triage nurse.   

## 2014-12-18 NOTE — Progress Notes (Signed)
Ricky Villanueva [phone]   Fax:(336) [name], [place] [address] 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  PRIOR THERAPY: 1. First-line chemotherapy with 5 cycles of carboplatin (cisplatin for the first cycle) and etoposide from 05/18/2013 to 10/15/2013.  2. WB XRT and palliative chest radiation given on 05/03/13 - 05/18/13 per Dr. Pablo Ledger.  CURRENT THERAPY: Systemic chemotherapy with carboplatin for AUC of 5 on day 1 and etoposide 100 MG/M2 on days 1, 2 and 3 with Neulasta support on day 4. Status post 4 cycles.   INTERVAL HISTORY: Ricky Villanueva 64 y.o. male returns to the clinic today for follow-up visit accompanied by his wife, daughter, and son. Chemo was held last week due to thrombocytopenia. Denies bleeding. He denied having any significant chest pain but has mild shortness of breath. He denied having any vomiting. He has no fever or chills. He has no chest pain, shortness breath, cough or hemoptysis. Appetite is fair. He has not lost any additional weight. Pain is controlled with MS Contin and Oxycodone. He is scheduled to receive cycle #5 of his chemo today.  MEDICAL HISTORY: Past Medical History  Diagnosis Date  . Hyperlipidemia   . Hypertension   . GERD (gastroesophageal reflux disease)   . Respiratory failure with hypoxia (Ricky Villanueva) 04/21/2013    secondary to pneumonia/notes 04/21/2013  . Pulmonary embolism (Ricky Villanueva)     "got one in there now" (04/21/2013)  . COPD (chronic obstructive pulmonary disease) (Ricky Villanueva)   . Pneumonia 2/?01/2014    "wouldn't get better; hospitalized 04/21/2013)  . Arthritis     "minor in my right hand" (04/21/2013)  . History of radiation therapy 05/03/13-05/18/13    chest & whole brain  . Cancer (Ricky Villanueva)     lung ca dx'd 03/2013  . Colon polyps     hyperplastic  . Right arm pain 08/21/2014     ALLERGIES:  is allergic to decadron and augmentin.  MEDICATIONS:  Current Outpatient Prescriptions  Medication Sig Dispense Refill  . chlorpheniramine-HYDROcodone (TUSSIONEX PENNKINETIC ER) 10-8 MG/5ML SUER Take 5 mLs by mouth every 12 (twelve) hours as needed for cough. 140 mL 0  . dextromethorphan 15 MG/5ML syrup Take 10 mLs by mouth as needed for cough.    . docusate sodium (COLACE) 100 MG capsule Take 300 mg by mouth 2 (two) times daily as needed for mild constipation.     . Iron-Vitamins (GERITOL PO) Take 1 tablet by mouth daily.    [name] levofloxacin (LEVAQUIN) 500 MG tablet Take 1 tablet (500 mg total) by mouth daily. 7 tablet 0  . lidocaine-prilocaine (EMLA) cream Apply to port site one hour before treatment and cover with plastic wrap. 1 each 2  . LORazepam (ATIVAN) 0.5 MG tablet Take 0.5 mg by mouth 2 (two) times daily as needed.     . magnesium hydroxide (MILK OF MAGNESIA) 400 MG/5ML suspension Take 5 mLs by mouth daily as needed for mild constipation.    . mirtazapine (REMERON) 30 MG tablet Take 1 tablet (30 mg total) by mouth at bedtime. 90 tablet 1  . morphine (MS CONTIN) 30 MG 12 hr tablet Take 1 tablet (30 mg total) by mouth every 12 (twelve) hours. 60 tablet 0  . omeprazole (PRILOSEC) 40 MG capsule TAKE 1 CAPSULE (40 MG TOTAL)  BY MOUTH DAILY. 30 capsule 0  .  ondansetron (ZOFRAN) 8 MG tablet Take 8 mg by mouth every 8 (eight) hours as needed.    [name] oxyCODONE (OXY IR/ROXICODONE) 5 MG immediate release tablet Take 1 tablet (5 mg total) by mouth every 6 (six) hours as needed for severe pain. 120 tablet 0  . polyethylene glycol powder (MIRALAX) powder Take 17 g by mouth daily. (Patient taking differently: Take 17 g by mouth daily as needed for mild constipation or moderate constipation. ) 255 g 0  . PROAIR HFA 108 (90 BASE) MCG/ACT inhaler USE 2 INHALATIONS EVERY 6 HOURS AS NEEDED FOR WHEEZING OR SHORTNESS OF BREATH 8.5 g 1  . prochlorperazine (COMPAZINE) 10 MG tablet Take 1 tablet  (10 mg total) by mouth every 6 (six) hours as needed for nausea or vomiting (nausea). 30 tablet 3  . tiotropium (SPIRIVA HANDIHALER) 18 MCG inhalation capsule Place 1 capsule (18 mcg total) into inhaler and inhale daily. 90 capsule 1  . VENTOLIN HFA 108 (90 BASE) MCG/ACT inhaler Inhale 2 puffs into the lungs every 6 (six) hours as needed.     . vitamin B-12 (CYANOCOBALAMIN) 100 MCG tablet Take 100 mcg by mouth daily.    Alveda Reasons 20 MG TABS tablet TAKE 1 TABLET (20 MG TOTAL) BY MOUTH DAILY WITH SUPPER. 30 tablet 0   No current facility-administered medications for this visit.   Facility-Administered Medications Ordered in Other Visits  Medication Dose Route Frequency Provider Last Rate Last Dose  . 0.9 %  sodium chloride infusion  1,000 mL Intravenous Once Concha Norway, MD      . 0.9 %  sodium chloride infusion   Intravenous Once Concha Norway, MD      . CARBOplatin (PARAPLATIN) 360 mg in sodium chloride 0.9 % 100 mL chemo infusion  360 mg Intravenous Once Curt Bears, MD 272 mL/hr at 12/18/14 1241 360 mg at 12/18/14 1241  . etoposide (VEPESID) 120 mg in sodium chloride 0.9 % 500 mL chemo infusion  80 mg/m2 (Treatment Plan Actual) Intravenous Once Curt Bears, MD      . heparin lock flush 100 unit/mL  500 Units Intracatheter Once PRN Curt Bears, MD      . sodium chloride 0.9 % injection 10 mL  10 mL Intracatheter PRN Curt Bears, MD        SURGICAL HISTORY:  Past Surgical History  Procedure Laterality Date  . Inguinal hernia repair Right ~ 2005  . Finger surgery Left ~ 1989    "crushed end of my middle finger off"   . Video bronchoscopy Bilateral 04/26/2013    Procedure: VIDEO BRONCHOSCOPY WITH FLUORO;  Surgeon: Wilhelmina Mcardle, MD;  Location: [facility] ENDOSCOPY;  Service: Cardiopulmonary;  Laterality: Bilateral;    REVIEW OF SYSTEMS:  Constitutional: positive for fatigue Eyes: negative Ears, nose, mouth, throat, and face: negative Respiratory: negative Cardiovascular:  negative Gastrointestinal: negative Genitourinary:negative Integument/breast: negative Hematologic/lymphatic: negative Musculoskeletal:negative Neurological: negative Behavioral/Psych: negative Endocrine: negative Allergic/Immunologic: negative   PHYSICAL EXAMINATION: General appearance: alert, cooperative, fatigued and no distress Head: Normocephalic, without obvious abnormality, atraumatic Neck: no adenopathy, no JVD, supple, symmetrical, trachea midline and thyroid not enlarged, symmetric, no tenderness/mass/nodules Lymph nodes: Cervical, supraclavicular, and axillary nodes normal. Resp: clear to auscultation bilaterally Back: symmetric, no curvature. ROM normal. No CVA tenderness. Cardio: regular rate and rhythm, S1, S2 normal, no murmur, click, rub or gallop GI: soft, non-tender; bowel sounds normal; no masses,  no organomegaly Extremities: extremities normal, atraumatic, no cyanosis or edema Neurologic: Alert and oriented X 3, normal strength and  tone. Normal symmetric reflexes. Normal coordination and gait  ECOG PERFORMANCE STATUS: 1 - Symptomatic but completely ambulatory  Blood pressure 101/78, pulse 92, temperature 97.5 F (36.4 C), temperature source Oral, resp. rate 17, height '5\' 7"'$  (1.702 m), weight 109 lb 3.2 oz (49.533 kg), SpO2 94 %.  LABORATORY DATA: Lab Results  Component Value Date   WBC 6.3 12/18/2014   HGB 11.5* 12/18/2014   HCT 33.7* 12/18/2014   MCV 97.3 12/18/2014   PLT 307 12/18/2014      Chemistry      Component Value Date/Time   NA 139 12/18/2014 0936   NA 139 09/01/2014 1313   K 4.0 12/18/2014 0936   K 4.5 09/01/2014 1313   CL 103 09/01/2014 1313   CO2 29 12/18/2014 0936   CO2 27 09/01/2014 1313   BUN 9.5 12/18/2014 0936   BUN 11 09/01/2014 1313   CREATININE 0.8 12/18/2014 0936   CREATININE 0.92 09/01/2014 1313      Component Value Date/Time   CALCIUM 9.4 12/18/2014 0936   CALCIUM 9.5 09/01/2014 1313   ALKPHOS 88 12/18/2014 0936    ALKPHOS 67 09/01/2014 1313   AST 17 12/18/2014 0936   AST 20 09/01/2014 1313   ALT 13 12/18/2014 0936   ALT 14* 09/01/2014 1313   BILITOT 0.30 12/18/2014 0936   BILITOT 0.9 09/01/2014 1313       RADIOGRAPHIC STUDIES: No results found.  ASSESSMENT AND PLAN: This is a very pleasant 64 year old white male with extensive stage small cell lung cancer status post palliative radiotherapy to the brain and chest as well as 6 cycles of systemic chemotherapy with carboplatin and etoposide completed in August 2015. The patient has been observation for around 10 months. Repeat CT scan of the chest, abdomen and pelvis showed evidence for disease progression with multiple ill-defined soft tissue masses in the right lower lobe and an enlarging subdiaphragmatic lymph nodes and a new suspicious hepatic metastasis. He is currently on systemic chemotherapy with carboplatin and etoposide, status post 4 cycles and tolerating his treatment fairly well except for the pancytopenia associated with weakness and fatigue.  He received PRBCs and platelet transfusion 2 weeks ago. His hemoglobin and platelets are better.  The patient was seen and discussed with Dr. [name]. Recommended for the patient to proceed with cycle 5 of his chemo today. Carboplatin dose will be reduced to an AUC of 4 and the etoposide will be reduced to a dose of 80 mg/m2.  For the history of deep venous thrombosis, he will continue his treatment with Xarelto.  The patient will have weekly labs and return for a visit in 3 weeks prior to cycle #6.  The patient was advised to call if he has any concerning symptoms in the interval. The patient voices understanding of current disease status and treatment options and is in agreement with the current care plan.  All questions were answered. The patient knows to call the clinic with any problems, questions or concerns. We can certainly see the patient much sooner if necessary.  Mikey Bussing, DNP,  AGPCNP-BC, AOCNP  ADDENDUM: Hematology/Oncology Attending: I had a face to face encounter with the patient. I recommended his care plan. This is a very pleasant 64 years old white male with recurrent small cell lung cancer currently undergoing second course of systemic chemotherapy with carboplatin and etoposide status post 4 cycles. The patient has been tolerating his treatment fairly well except for pancytopenia after his treatment and he required PRBCs  and platelet transfusion recently. He is feeling much better today and would like to proceed with his chemotherapy. We will proceed with cycle #5 has a scheduled but we will reduce the dose of carboplatin to AUC of 4 on day 1 and etoposide at 80 MG/M2 on days 1, 2 and 3 with Neulasta support on day 4. The patient would come back for follow-up visit in 3 weeks for reevaluation before starting cycle #6. He was advised to call immediately if he has any concerning symptoms in the interval.  Disclaimer: This note was dictated with voice recognition software. Similar sounding words can inadvertently be transcribed and may be missed upon review. Eilleen Kempf., MD 12/29/2014

## 2014-12-18 NOTE — Telephone Encounter (Signed)
Added appt per pof...the patient will get new sched in chemo

## 2014-12-18 NOTE — Telephone Encounter (Signed)
Added flush appointments to labs. Gave wife new schedule for October and November. [name]

## 2014-12-18 NOTE — Patient Instructions (Signed)

## 2014-12-19 ENCOUNTER — Ambulatory Visit (HOSPITAL_BASED_OUTPATIENT_CLINIC_OR_DEPARTMENT_OTHER): Payer: BLUE CROSS/BLUE SHIELD

## 2014-12-19 VITALS — BP 111/81 | HR 85 | Temp 98.1°F | Resp 19

## 2014-12-19 DIAGNOSIS — Z5111 Encounter for antineoplastic chemotherapy: Secondary | ICD-10-CM | POA: Diagnosis not present

## 2014-12-19 DIAGNOSIS — C3491 Malignant neoplasm of unspecified part of right bronchus or lung: Secondary | ICD-10-CM | POA: Diagnosis not present

## 2014-12-19 MED ORDER — HEPARIN SOD (PORK) LOCK FLUSH 100 UNIT/ML IV SOLN
500.0000 [IU] | Freq: Once | INTRAVENOUS | Status: AC | PRN
  Administered 2014-12-19: 500 [IU]
  Filled 2014-12-19: qty 5

## 2014-12-19 MED ORDER — SODIUM CHLORIDE 0.9 % IV SOLN
Freq: Once | INTRAVENOUS | Status: AC
  Administered 2014-12-19: 09:00:00 via INTRAVENOUS

## 2014-12-19 MED ORDER — SODIUM CHLORIDE 0.9 % IJ SOLN
10.0000 mL | INTRAMUSCULAR | Status: DC | PRN
  Administered 2014-12-19: 10 mL
  Filled 2014-12-19: qty 10

## 2014-12-19 MED ORDER — SODIUM CHLORIDE 0.9 % IV SOLN
80.0000 mg/m2 | Freq: Once | INTRAVENOUS | Status: AC
  Administered 2014-12-19: 120 mg via INTRAVENOUS
  Filled 2014-12-19: qty 6

## 2014-12-19 MED ORDER — PROCHLORPERAZINE MALEATE 10 MG PO TABS
ORAL_TABLET | ORAL | Status: AC
  Filled 2014-12-19: qty 1

## 2014-12-19 MED ORDER — PROCHLORPERAZINE MALEATE 10 MG PO TABS
10.0000 mg | ORAL_TABLET | Freq: Once | ORAL | Status: AC
  Administered 2014-12-19: 10 mg via ORAL

## 2014-12-19 NOTE — Patient Instructions (Signed)
[facility] Discharge Instructions for Patients Receiving Chemotherapy  Today you received the following chemotherapy agents: Etoposide  To help prevent nausea and vomiting after your treatment, we encourage you to take your nausea medication as prescribed by your physician.   If you develop nausea and vomiting that is not controlled by your nausea medication, call the clinic.   BELOW ARE SYMPTOMS THAT SHOULD BE REPORTED IMMEDIATELY:  *FEVER GREATER THAN 100.5 F  *CHILLS WITH OR WITHOUT FEVER  NAUSEA AND VOMITING THAT IS NOT CONTROLLED WITH YOUR NAUSEA MEDICATION  *UNUSUAL SHORTNESS OF BREATH  *UNUSUAL BRUISING OR BLEEDING  TENDERNESS IN MOUTH AND THROAT WITH OR WITHOUT PRESENCE OF ULCERS  *URINARY PROBLEMS  *BOWEL PROBLEMS  UNUSUAL RASH Items with * indicate a potential emergency and should be followed up as soon as possible.  Feel free to call the clinic you have any questions or concerns. The clinic phone number is [phone].  Please show the CHEMO ALERT CARD at check-in to the Emergency Department and triage nurse.   

## 2014-12-20 ENCOUNTER — Ambulatory Visit (HOSPITAL_BASED_OUTPATIENT_CLINIC_OR_DEPARTMENT_OTHER): Payer: BLUE CROSS/BLUE SHIELD

## 2014-12-20 VITALS — BP 129/97 | HR 86 | Temp 97.9°F | Resp 18

## 2014-12-20 DIAGNOSIS — Z5111 Encounter for antineoplastic chemotherapy: Secondary | ICD-10-CM | POA: Diagnosis not present

## 2014-12-20 DIAGNOSIS — C3491 Malignant neoplasm of unspecified part of right bronchus or lung: Secondary | ICD-10-CM | POA: Diagnosis not present

## 2014-12-20 MED ORDER — SODIUM CHLORIDE 0.9 % IV SOLN
80.0000 mg/m2 | Freq: Once | INTRAVENOUS | Status: AC
  Administered 2014-12-20: 120 mg via INTRAVENOUS
  Filled 2014-12-20: qty 6

## 2014-12-20 MED ORDER — PROCHLORPERAZINE MALEATE 10 MG PO TABS
10.0000 mg | ORAL_TABLET | Freq: Once | ORAL | Status: AC
  Administered 2014-12-20: 10 mg via ORAL

## 2014-12-20 MED ORDER — PROCHLORPERAZINE MALEATE 10 MG PO TABS
ORAL_TABLET | ORAL | Status: AC
  Filled 2014-12-20: qty 1

## 2014-12-20 MED ORDER — HEPARIN SOD (PORK) LOCK FLUSH 100 UNIT/ML IV SOLN
500.0000 [IU] | Freq: Once | INTRAVENOUS | Status: AC | PRN
  Administered 2014-12-20: 500 [IU]
  Filled 2014-12-20: qty 5

## 2014-12-20 MED ORDER — SODIUM CHLORIDE 0.9 % IV SOLN
Freq: Once | INTRAVENOUS | Status: AC
  Administered 2014-12-20: 09:00:00 via INTRAVENOUS

## 2014-12-20 MED ORDER — SODIUM CHLORIDE 0.9 % IJ SOLN
10.0000 mL | INTRAMUSCULAR | Status: DC | PRN
  Administered 2014-12-20: 10 mL
  Filled 2014-12-20: qty 10

## 2014-12-20 NOTE — Patient Instructions (Signed)
[facility] Discharge Instructions for Patients Receiving Chemotherapy  Today you received the following chemotherapy agents: Etoposide   To help prevent nausea and vomiting after your treatment, we encourage you to take your nausea medication as directed.    If you develop nausea and vomiting that is not controlled by your nausea medication, call the clinic.   BELOW ARE SYMPTOMS THAT SHOULD BE REPORTED IMMEDIATELY:  *FEVER GREATER THAN 100.5 F  *CHILLS WITH OR WITHOUT FEVER  NAUSEA AND VOMITING THAT IS NOT CONTROLLED WITH YOUR NAUSEA MEDICATION  *UNUSUAL SHORTNESS OF BREATH  *UNUSUAL BRUISING OR BLEEDING  TENDERNESS IN MOUTH AND THROAT WITH OR WITHOUT PRESENCE OF ULCERS  *URINARY PROBLEMS  *BOWEL PROBLEMS  UNUSUAL RASH Items with * indicate a potential emergency and should be followed up as soon as possible.  Feel free to call the clinic you have any questions or concerns. The clinic phone number is [phone].  Please show the CHEMO ALERT CARD at check-in to the Emergency Department and triage nurse.   

## 2014-12-21 ENCOUNTER — Ambulatory Visit (HOSPITAL_BASED_OUTPATIENT_CLINIC_OR_DEPARTMENT_OTHER): Payer: BLUE CROSS/BLUE SHIELD

## 2014-12-21 VITALS — BP 107/83 | HR 97 | Temp 98.1°F

## 2014-12-21 DIAGNOSIS — Z5189 Encounter for other specified aftercare: Secondary | ICD-10-CM | POA: Diagnosis not present

## 2014-12-21 DIAGNOSIS — C3491 Malignant neoplasm of unspecified part of right bronchus or lung: Secondary | ICD-10-CM | POA: Diagnosis not present

## 2014-12-21 MED ORDER — PEGFILGRASTIM INJECTION 6 MG/0.6ML ~~LOC~~
6.0000 mg | PREFILLED_SYRINGE | Freq: Once | SUBCUTANEOUS | Status: AC
  Administered 2014-12-21: 6 mg via SUBCUTANEOUS
  Filled 2014-12-21: qty 0.6

## 2014-12-25 ENCOUNTER — Telehealth: Payer: Self-pay

## 2014-12-25 ENCOUNTER — Ambulatory Visit: Payer: BLUE CROSS/BLUE SHIELD

## 2014-12-25 ENCOUNTER — Ambulatory Visit (HOSPITAL_BASED_OUTPATIENT_CLINIC_OR_DEPARTMENT_OTHER): Payer: BLUE CROSS/BLUE SHIELD | Admitting: Nurse Practitioner

## 2014-12-25 ENCOUNTER — Other Ambulatory Visit: Payer: Self-pay | Admitting: *Deleted

## 2014-12-25 ENCOUNTER — Other Ambulatory Visit (HOSPITAL_BASED_OUTPATIENT_CLINIC_OR_DEPARTMENT_OTHER): Payer: BLUE CROSS/BLUE SHIELD

## 2014-12-25 ENCOUNTER — Ambulatory Visit: Payer: BLUE CROSS/BLUE SHIELD | Admitting: Internal Medicine

## 2014-12-25 ENCOUNTER — Other Ambulatory Visit: Payer: BLUE CROSS/BLUE SHIELD

## 2014-12-25 VITALS — BP 116/83 | HR 87 | Temp 98.4°F | Resp 17 | Ht 67.0 in | Wt 109.9 lb

## 2014-12-25 DIAGNOSIS — C3490 Malignant neoplasm of unspecified part of unspecified bronchus or lung: Secondary | ICD-10-CM

## 2014-12-25 DIAGNOSIS — Z7901 Long term (current) use of anticoagulants: Secondary | ICD-10-CM

## 2014-12-25 DIAGNOSIS — D61818 Other pancytopenia: Secondary | ICD-10-CM

## 2014-12-25 DIAGNOSIS — D6959 Other secondary thrombocytopenia: Secondary | ICD-10-CM

## 2014-12-25 DIAGNOSIS — Z95828 Presence of other vascular implants and grafts: Secondary | ICD-10-CM

## 2014-12-25 DIAGNOSIS — T451X5A Adverse effect of antineoplastic and immunosuppressive drugs, initial encounter: Secondary | ICD-10-CM

## 2014-12-25 DIAGNOSIS — K130 Diseases of lips: Secondary | ICD-10-CM | POA: Diagnosis not present

## 2014-12-25 DIAGNOSIS — Z86718 Personal history of other venous thrombosis and embolism: Secondary | ICD-10-CM

## 2014-12-25 LAB — CBC WITH DIFFERENTIAL/PLATELET
BASO%: 0.2 % (ref 0.0–2.0)
BASOS ABS: 0 10*3/uL (ref 0.0–0.1)
EOS ABS: 0 10*3/uL (ref 0.0–0.5)
EOS%: 0.2 % (ref 0.0–7.0)
HEMATOCRIT: 30.2 % — AB (ref 38.4–49.9)
HEMOGLOBIN: 9.9 g/dL — AB (ref 13.0–17.1)
LYMPH#: 0.8 10*3/uL — AB (ref 0.9–3.3)
LYMPH%: 6.4 % — ABNORMAL LOW (ref 14.0–49.0)
MCH: 32.9 pg (ref 27.2–33.4)
MCHC: 32.8 g/dL (ref 32.0–36.0)
MCV: 100.3 fL — AB (ref 79.3–98.0)
MONO#: 0.4 10*3/uL (ref 0.1–0.9)
MONO%: 3.1 % (ref 0.0–14.0)
NEUT#: 11 10*3/uL — ABNORMAL HIGH (ref 1.5–6.5)
NEUT%: 90.1 % — ABNORMAL HIGH (ref 39.0–75.0)
PLATELETS: 45 10*3/uL — AB (ref 140–400)
RBC: 3.01 10*6/uL — ABNORMAL LOW (ref 4.20–5.82)
RDW: 16.8 % — AB (ref 11.0–14.6)
WBC: 12.2 10*3/uL — ABNORMAL HIGH (ref 4.0–10.3)

## 2014-12-25 LAB — COMPREHENSIVE METABOLIC PANEL (CC13)
ALBUMIN: 3.2 g/dL — AB (ref 3.5–5.0)
ALK PHOS: 143 U/L (ref 40–150)
ALT: 15 U/L (ref 0–55)
AST: 16 U/L (ref 5–34)
Anion Gap: 6 mEq/L (ref 3–11)
BUN: 11.8 mg/dL (ref 7.0–26.0)
CALCIUM: 9.3 mg/dL (ref 8.4–10.4)
CO2: 32 mEq/L — ABNORMAL HIGH (ref 22–29)
Chloride: 101 mEq/L (ref 98–109)
Creatinine: 0.7 mg/dL (ref 0.7–1.3)
Glucose: 115 mg/dl (ref 70–140)
POTASSIUM: 4.6 meq/L (ref 3.5–5.1)
Sodium: 139 mEq/L (ref 136–145)
Total Bilirubin: 0.65 mg/dL (ref 0.20–1.20)
Total Protein: 6.4 g/dL (ref 6.4–8.3)

## 2014-12-25 MED ORDER — LIDOCAINE-PRILOCAINE 2.5-2.5 % EX KIT: PACK | CUTANEOUS | Status: AC

## 2014-12-25 NOTE — Telephone Encounter (Signed)
Returning call. Pt has a hard knot on the inside of his R lip. Wife can see no sore or open spot. It is tender. Also the R side of his neck is tender.  He is requesting to see MD during his lab appt today. Set up [facility] visit for 330.

## 2014-12-26 ENCOUNTER — Ambulatory Visit: Payer: BLUE CROSS/BLUE SHIELD

## 2014-12-26 ENCOUNTER — Telehealth: Payer: Self-pay | Admitting: *Deleted

## 2014-12-26 ENCOUNTER — Encounter: Payer: Self-pay | Admitting: Nurse Practitioner

## 2014-12-26 DIAGNOSIS — K130 Diseases of lips: Secondary | ICD-10-CM | POA: Insufficient documentation

## 2014-12-26 DIAGNOSIS — D6959 Other secondary thrombocytopenia: Secondary | ICD-10-CM | POA: Insufficient documentation

## 2014-12-26 DIAGNOSIS — T451X5A Adverse effect of antineoplastic and immunosuppressive drugs, initial encounter: Secondary | ICD-10-CM

## 2014-12-26 NOTE — Assessment & Plan Note (Signed)
Patient received cycle 5, day 1 of his carboplatin/etoposide chemotherapy regimen on 12/18/2014.  Patient is tolerating his chemotherapy fairly well; with the exception of new onset right upper lip mass within the past 7-10 days.  Blood counts obtained today reveal a WBC of 12.2, ANC 11.0, he was given 9.9, platelet count 45.  Patient has plans to return for labs only on 01/01/2015.  Patient will return for labs, visit, and chemotherapy therapy on 01/08/2015.

## 2014-12-26 NOTE — Assessment & Plan Note (Signed)
Patient has a history of pulmonary embolism; and continues to take Xarelto.

## 2014-12-26 NOTE — Telephone Encounter (Signed)
Call from patient's wife asking if Ricky Villanueva is to be seen here tomorrow.  Was expecting [facility] call today in reference to appointments.  Per Selena Lesser patient can expect an appointment with oral surgeon at the earliest with-in a week or so once referral is requested.  [name] notified.

## 2014-12-26 NOTE — Assessment & Plan Note (Signed)
Place account obtained today was 45.  Patient is also taking Xarelto on a daily basis as directed for previously diagnosed pulmonary embolism.  Patient has now developed a right upper lip mass-which could very well be a forming hematoma.  Patient was advised to apply cool/cold compresses to the site to see if that helps.  Patient denies any other issues with bleeding or bruising.  Will continue to monitor closely.

## 2014-12-26 NOTE — Assessment & Plan Note (Signed)
Patient states that he noticed a small mass to his right upper lip approximate 10 days ago; and states that this mass has now enlarged.  He states that he has bitten at the mass.  Several times by accident; since it is quite large.  He denies any specific pain to the site.  He also denies any other known injury to the site.  Exam reveals right upper lip mass, which feels fairly firm with palpation.  There is no tenderness to the site.  There is also no obvious bruising to the site.  Patient's labs obtained today do reveal thrombocytopenia; so lip mass may actually be a developing hematoma.  Also, patient is on Xarelto for anticoagulation.  Patient was advised to apply some cord moist compresses to the site.  This week; to see if there is any improvement.  Also advised patient would attempt to arrange an appointment with an oral surgeon for further evaluation if the site does not improve.

## 2014-12-26 NOTE — Progress Notes (Signed)
SYMPTOM MANAGEMENT CLINIC   HPI: Ricky Villanueva 64 y.o. male diagnosed with lung cancer.  Currently undergoing carboplatin/etoposide chemotherapy regimen.  Patient states that he noticed a small mass to his right upper lip approximate 10 days ago; and states that this mass has now enlarged.  He states that he has bitten at the mass.  Several times by accident; since it is quite large.  He denies any specific pain to the site.  He also denies any other known injury to the site.  Exam reveals right upper lip mass, which feels fairly firm with palpation.  There is no tenderness to the site.  There is also no obvious bruising to the site.  Patient's labs obtained today do reveal thrombocytopenia; so lip mass may actually be a developing hematoma.  Also, patient is on Xarelto for anticoagulation.  Patient was advised to apply some cord moist compresses to the site.  This week; to see if there is any improvement.  Also advised patient would attempt to arrange an appointment with an oral surgeon for further evaluation if the site does not improve.    HPI  ROS  Past Medical History  Diagnosis Date  . Hyperlipidemia   . Hypertension   . GERD (gastroesophageal reflux disease)   . Respiratory failure with hypoxia ([place]) 04/21/2013    secondary to pneumonia/notes 04/21/2013  . Pulmonary embolism ([place])     "got one in there now" (04/21/2013)  . COPD (chronic obstructive pulmonary disease) ([place])   . Pneumonia 2/?01/2014    "wouldn't get better; hospitalized 04/21/2013)  . Arthritis     "minor in my right hand" (04/21/2013)  . History of radiation therapy 05/03/13-05/18/13    chest & whole brain  . Cancer ([place])     lung ca dx'd 03/2013  . Colon polyps     hyperplastic  . Right arm pain 08/21/2014    Past Surgical History  Procedure Laterality Date  . Inguinal hernia repair Right ~ 2005  . Finger surgery Left ~ 1989    "crushed end of my middle finger off"   . 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    is allergic to decadron and augmentin.    Medication List       This list is accurate as of: 12/25/14 11:59 PM.  Always use your most recent med list.               chlorpheniramine-HYDROcodone 10-8 MG/5ML Suer  Commonly known as:  [facility] ER  Take 5 mLs by mouth every 12 (twelve) hours as needed for cough.     dextromethorphan 15 MG/5ML syrup  Take 10 mLs by mouth as needed for cough.     docusate sodium 100 MG capsule  Commonly known as:  COLACE  Take 300 mg by mouth 2 (two) times daily as needed for mild constipation.     GERITOL PO  Take 1 tablet by mouth  daily.     lidocaine-prilocaine cream  Commonly known as:  EMLA  Apply to port site one hour before treatment and cover with plastic wrap.     LORazepam 0.5 MG tablet  Commonly known as:  ATIVAN  Take 0.5 mg by mouth 2 (two) times daily as needed.     magnesium hydroxide 400 MG/5ML suspension  Commonly known as:  MILK OF MAGNESIA  Take 5 mLs by mouth daily as needed for mild constipation.     mirtazapine 30 MG tablet  Commonly known as:  REMERON  Take 1 tablet (30 mg total) by mouth at bedtime.     morphine 30 MG 12 hr tablet  Commonly known as:  MS CONTIN  Take 1 tablet  (30 mg total) by mouth every 12 (twelve) hours.     omeprazole 40 MG capsule  Commonly known as:  PRILOSEC  TAKE 1 CAPSULE (40 MG TOTAL)  BY MOUTH DAILY.     ondansetron 8 MG tablet  Commonly known as:  ZOFRAN  Take 8 mg by mouth every 8 (eight) hours as needed.     oxyCODONE 5 MG immediate release tablet  Commonly known as:  Oxy IR/ROXICODONE  Take 1 tablet (5 mg total) by mouth every 6 (six) hours as needed for severe pain.     polyethylene glycol powder powder  Commonly known as:  MIRALAX  Take 17 g by mouth daily.     prochlorperazine 10 MG tablet  Commonly known as:  COMPAZINE  Take 1 tablet (10 mg total) by mouth every 6 (six) hours as needed for nausea or vomiting (nausea).     VENTOLIN HFA 108 (90 BASE) MCG/ACT inhaler  Generic drug:  albuterol  Inhale 2 puffs into the lungs every 6 (six) hours as needed.     PROAIR HFA 108 (90 BASE) MCG/ACT inhaler  Generic drug:  albuterol  USE 2 INHALATIONS EVERY 6 HOURS AS NEEDED FOR WHEEZING OR SHORTNESS OF BREATH     vitamin B-12 100 MCG tablet  Commonly known as:  CYANOCOBALAMIN  Take 100 mcg by mouth daily.     XARELTO 20 MG Tabs tablet  Generic drug:  rivaroxaban  TAKE 1 TABLET (20 MG TOTAL) BY MOUTH DAILY WITH SUPPER.         PHYSICAL EXAMINATION  Oncology Vitals 12/25/2014 12/21/2014 12/20/2014 12/19/2014 12/18/2014 12/04/2014 11/27/2014  Height 170 cm - - - 170 cm 170 cm -  Weight 49.85 kg - - - 49.533 kg 49.76 kg -  Weight (lbs) 109 lbs 14 oz - - - 109 lbs 3 oz 109 lbs 11 oz -  BMI (kg/m2) 17.21 kg/m2 - - - 17.1 kg/m2 17.18 kg/m2 -  Temp 98.4 98.1 97.9 98.1 97.5 98 97.8  Pulse 87 97 86 85 92 97 64  Resp 17 - '18 19 17 17 18  ' SpO2 95 - 98 96 94 95 100  BSA (m2) 1.54 m2 - - - 1.53 m2 1.53 m2 -   BP Readings from Last 3 Encounters:  12/25/14 116/83  12/21/14 107/83  12/20/14 129/97    Physical Exam  Constitutional: He is oriented to person, place, and time.  Very thin, frail, chronically ill patient.    HENT:  Head: Normocephalic and atraumatic.  Mouth/Throat: Oropharynx is clear and moist.  Exam reveals right upper lip mass, which feels fairly firm with palpation.  There is no tenderness to the site.  There is also no obvious bruising to the site.  Eyes: Conjunctivae and EOM are normal. Pupils are equal, round, and reactive to light. Right eye exhibits no discharge. Left eye exhibits no discharge. No scleral icterus.  Neck: Normal range of motion.  Pulmonary/Chest: Effort normal. No respiratory distress.  Musculoskeletal: Normal range of motion.  Neurological: He is alert and oriented to person, place, and time. Gait normal.  Skin: Skin is warm and dry. No rash noted. No erythema. No pallor.  Psychiatric: Affect normal.  Nursing note and vitals reviewed.   LABORATORY DATA:. Appointment on 12/25/2014  Component Date Value Ref Range Status  . WBC 12/25/2014 12.2* 4.0 - 10.3 10e3/uL Final  . NEUT# 12/25/2014 11.0* 1.5 - 6.5 10e3/uL Final  . HGB 12/25/2014 9.9* 13.0 - 17.1 g/dL Final  . HCT 12/25/2014 30.2* 38.4 - 49.9 % Final  . Platelets 12/25/2014 45* 140 - 400 10e3/uL Final  . MCV 12/25/2014 100.3* 79.3 - 98.0 fL Final  . MCH 12/25/2014 32.9  27.2 - 33.4 pg Final  . MCHC 12/25/2014 32.8  32.0 - 36.0 g/dL Final  . RBC 12/25/2014 3.01* 4.20 - 5.82 10e6/uL Final  . RDW 12/25/2014 16.8* 11.0 - 14.6 % Final  . lymph# 12/25/2014 0.8* 0.9 - 3.3 10e3/uL Final  . MONO# 12/25/2014 0.4  0.1 - 0.9 10e3/uL Final  . Eosinophils Absolute 12/25/2014 0.0  0.0 - 0.5 10e3/uL Final  . Basophils Absolute 12/25/2014 0.0  0.0 - 0.1 10e3/uL Final  . NEUT% 12/25/2014 90.1* 39.0 - 75.0 % Final  . LYMPH% 12/25/2014 6.4* 14.0 - 49.0 % Final  . MONO% 12/25/2014 3.1  0.0 - 14.0 % Final  . EOS% 12/25/2014 0.2  0.0 - 7.0 % Final  . BASO% 12/25/2014 0.2  0.0 - 2.0 % Final  . Sodium 12/25/2014 139  136 - 145 mEq/L Final  . Potassium 12/25/2014 4.6  3.5 - 5.1 mEq/L Final  . Chloride 12/25/2014 101  98  - 109 mEq/L Final  . CO2 12/25/2014 32* 22 - 29 mEq/L Final  . Glucose 12/25/2014 115  70 - 140 mg/dl Final   Glucose reference range is for nonfasting patients. Fasting glucose reference range is 70- 100.  [name] BUN 12/25/2014 11.8  7.0 - 26.0 mg/dL Final  . Creatinine 12/25/2014 0.7  0.7 - 1.3 mg/dL Final  . Total Bilirubin 12/25/2014 0.65  0.20 - 1.20 mg/dL Final  . Alkaline Phosphatase 12/25/2014 143  40 - 150 U/L Final  . AST 12/25/2014 16  5 - 34 U/L Final  . ALT 12/25/2014 15  0 - 55 U/L Final  . Total Protein 12/25/2014 6.4  6.4 - 8.3 g/dL Final  . Albumin 12/25/2014 3.2* 3.5 - 5.0 g/dL Final  . Calcium 12/25/2014 9.3  8.4 - 10.4 mg/dL Final  . Anion Gap 12/25/2014 6  3 - 11 mEq/L Final  . EGFR 12/25/2014 >90  >90 ml/min/1.73 m2 Final   eGFR is calculated using the CKD-EPI Creatinine Equation (2009)   Right upper lip:      RADIOGRAPHIC STUDIES: No results found.  ASSESSMENT/PLAN:    Lip mass Patient states that he noticed a small mass to his right upper lip approximate 10 days ago; and states that this mass has now enlarged.  He states that he has bitten at the mass.  Several times by accident; since it is quite large.  He denies any specific pain to the site.  He also denies any other known injury to the site.  Exam reveals right upper lip mass, which feels fairly  firm with palpation.  There is no tenderness to the site.  There is also no obvious bruising to the site.  Patient's labs obtained today do reveal thrombocytopenia; so lip mass may actually be a developing hematoma.  Also, patient is on Xarelto for anticoagulation.  Patient was advised to apply some cord moist compresses to the site.  This week; to see if there is any improvement.  Also advised patient would attempt to arrange an appointment with an oral surgeon for further evaluation if the site does not improve.  Long term current use of anticoagulant therapy Patient has a history of pulmonary embolism; and  continues to take Xarelto.  Small cell carcinoma of lung Patient received cycle 5, day 1 of his carboplatin/etoposide chemotherapy regimen on 12/18/2014.  Patient is tolerating his chemotherapy fairly well; with the exception of new onset right upper lip mass within the past 7-10 days.  Blood counts obtained today reveal a WBC of 12.2, ANC 11.0, he was given 9.9, platelet count 45.  Patient has plans to return for labs only on 01/01/2015.  Patient will return for labs, visit, and chemotherapy therapy on 01/08/2015.  Chemotherapy induced thrombocytopenia Place account obtained today was 45.  Patient is also taking Xarelto on a daily basis as directed for previously diagnosed pulmonary embolism.  Patient has now developed a right upper lip mass-which could very well be a forming hematoma.  Patient was advised to apply cool/cold compresses to the site to see if that helps.  Patient denies any other issues with bleeding or bruising.  Will continue to monitor closely.  Patient stated understanding of all instructions; and was in agreement with this plan of care. The patient knows to call the clinic with any problems, questions or concerns.   This was a shared visit with Dr. [name] today.    Total time spent with patient was 25 minutes;  with greater than 75 percent of that time spent in face to face counseling regarding patient's symptoms,  and coordination of care and follow up.  Disclaimer:This dictation was prepared with Dragon/digital dictation along with [employer]. Any transcriptional errors that result from this process are unintentional.  Drue Second, NP 12/26/2014   ADDENDUM: Hematology/Oncology Attending: I had a face to face encounter with the patient. I recommended his care plan. This is a very pleasant 64 years old white male with recurrent small cell lung cancer currently undergoing systemic chemotherapy with carboplatin and etoposide status post 5 cycles. The  patient has been tolerating his treatment well except for the pancytopenia. He came today complaining of a small lesion in his right upper lobe started 10 days ago and slowly enlarging. He accidentally did bite that area a few times recently.  This is highly suspicious to be subcutaneous hematoma but metastatic lesion could not be excluded at this point. I recommended for the patient to monitor this lesion closely and to apply moist compresses. We will monitor this lesion closely on upcoming visit. The patient would come back for follow-up visit as previously scheduled but he was advised to call earlier if there is any worsening of this lesion. The patient was advised to call immediately if he has any other concerning symptoms in the interval.  Disclaimer: This note was dictated with voice recognition software. Similar sounding words can inadvertently be transcribed and may be missed upon review. Eilleen Kempf., MD 12/29/2014

## 2014-12-27 ENCOUNTER — Ambulatory Visit: Payer: BLUE CROSS/BLUE SHIELD

## 2014-12-27 ENCOUNTER — Telehealth: Payer: Self-pay | Admitting: *Deleted

## 2014-12-27 NOTE — Telephone Encounter (Signed)
Spoke to pt wife to check on pt and notify them of status of oral sx referral. Pt wife reports area in pt mouth has almost completley resolved. Pt wife states they would like to cancel referral. POF sent to schedulers to cancel referral.

## 2014-12-29 ENCOUNTER — Ambulatory Visit: Payer: BLUE CROSS/BLUE SHIELD

## 2015-01-01 ENCOUNTER — Ambulatory Visit (HOSPITAL_BASED_OUTPATIENT_CLINIC_OR_DEPARTMENT_OTHER): Payer: BLUE CROSS/BLUE SHIELD

## 2015-01-01 ENCOUNTER — Other Ambulatory Visit (HOSPITAL_BASED_OUTPATIENT_CLINIC_OR_DEPARTMENT_OTHER): Payer: BLUE CROSS/BLUE SHIELD

## 2015-01-01 ENCOUNTER — Ambulatory Visit (HOSPITAL_COMMUNITY)
Admission: RE | Admit: 2015-01-01 | Discharge: 2015-01-01 | Disposition: A | Discharge status: Home / Self Care | Payer: BLUE CROSS/BLUE SHIELD | Source: Ambulatory Visit | Attending: Internal Medicine | Admitting: Internal Medicine

## 2015-01-01 ENCOUNTER — Other Ambulatory Visit: Payer: Self-pay | Admitting: *Deleted

## 2015-01-01 ENCOUNTER — Other Ambulatory Visit: Payer: BLUE CROSS/BLUE SHIELD

## 2015-01-01 ENCOUNTER — Ambulatory Visit: Payer: BLUE CROSS/BLUE SHIELD

## 2015-01-01 VITALS — BP 118/72 | HR 70 | Temp 97.8°F | Resp 20

## 2015-01-01 DIAGNOSIS — M545 Low back pain, unspecified: Secondary | ICD-10-CM

## 2015-01-01 DIAGNOSIS — T451X5A Adverse effect of antineoplastic and immunosuppressive drugs, initial encounter: Secondary | ICD-10-CM | POA: Insufficient documentation

## 2015-01-01 DIAGNOSIS — R918 Other nonspecific abnormal finding of lung field: Secondary | ICD-10-CM

## 2015-01-01 DIAGNOSIS — K1230 Oral mucositis (ulcerative), unspecified: Secondary | ICD-10-CM

## 2015-01-01 DIAGNOSIS — C3490 Malignant neoplasm of unspecified part of unspecified bronchus or lung: Secondary | ICD-10-CM | POA: Diagnosis not present

## 2015-01-01 DIAGNOSIS — D6481 Anemia due to antineoplastic chemotherapy: Secondary | ICD-10-CM | POA: Insufficient documentation

## 2015-01-01 DIAGNOSIS — D696 Thrombocytopenia, unspecified: Secondary | ICD-10-CM

## 2015-01-01 LAB — COMPREHENSIVE METABOLIC PANEL (CC13)
ALT: 14 U/L (ref 0–55)
AST: 14 U/L (ref 5–34)
Albumin: 3.2 g/dL — ABNORMAL LOW (ref 3.5–5.0)
Alkaline Phosphatase: 104 U/L (ref 40–150)
Anion Gap: 7 mEq/L (ref 3–11)
BUN: 8.7 mg/dL (ref 7.0–26.0)
CO2: 30 meq/L — AB (ref 22–29)
Calcium: 9.3 mg/dL (ref 8.4–10.4)
Chloride: 103 mEq/L (ref 98–109)
Creatinine: 0.8 mg/dL (ref 0.7–1.3)
GLUCOSE: 137 mg/dL (ref 70–140)
Potassium: 4 mEq/L (ref 3.5–5.1)
SODIUM: 140 meq/L (ref 136–145)
TOTAL PROTEIN: 6.2 g/dL — AB (ref 6.4–8.3)

## 2015-01-01 LAB — CBC WITH DIFFERENTIAL/PLATELET
BASO%: 0 % (ref 0.0–2.0)
Basophils Absolute: 0 10*3/uL (ref 0.0–0.1)
EOS%: 1.3 % (ref 0.0–7.0)
Eosinophils Absolute: 0 10*3/uL (ref 0.0–0.5)
HCT: 24.6 % — ABNORMAL LOW (ref 38.4–49.9)
HGB: 8.4 g/dL — ABNORMAL LOW (ref 13.0–17.1)
LYMPH#: 0.6 10*3/uL — AB (ref 0.9–3.3)
LYMPH%: 20.1 % (ref 14.0–49.0)
MCH: 32.7 pg (ref 27.2–33.4)
MCHC: 34.1 g/dL (ref 32.0–36.0)
MCV: 95.7 fL (ref 79.3–98.0)
MONO#: 0.3 10*3/uL (ref 0.1–0.9)
MONO%: 9.7 % (ref 0.0–14.0)
NEUT%: 68.9 % (ref 39.0–75.0)
NEUTROS ABS: 2.1 10*3/uL (ref 1.5–6.5)
NRBC: 0 % (ref 0–0)
Platelets: 7 10*3/uL — CL (ref 140–400)
RBC: 2.57 10*6/uL — AB (ref 4.20–5.82)
RDW: 15.4 % — ABNORMAL HIGH (ref 11.0–14.6)
WBC: 3.1 10*3/uL — ABNORMAL LOW (ref 4.0–10.3)

## 2015-01-01 MED ORDER — DIPHENHYDRAMINE HCL 25 MG PO CAPS
25.0000 mg | ORAL_CAPSULE | Freq: Once | ORAL | Status: AC
  Administered 2015-01-01: 25 mg via ORAL

## 2015-01-01 MED ORDER — SODIUM CHLORIDE 0.9 % IJ SOLN
10.0000 mL | INTRAMUSCULAR | Status: AC | PRN
  Administered 2015-01-01: 10 mL
  Filled 2015-01-01: qty 10

## 2015-01-01 MED ORDER — DIPHENHYDRAMINE HCL 25 MG PO CAPS
ORAL_CAPSULE | ORAL | Status: AC
  Filled 2015-01-01: qty 1

## 2015-01-01 MED ORDER — HEPARIN SOD (PORK) LOCK FLUSH 100 UNIT/ML IV SOLN
500.0000 [IU] | Freq: Every day | INTRAVENOUS | Status: DC | PRN
  Filled 2015-01-01: qty 5

## 2015-01-01 MED ORDER — SODIUM CHLORIDE 0.9 % IJ SOLN
10.0000 mL | INTRAMUSCULAR | Status: DC | PRN
  Filled 2015-01-01: qty 10

## 2015-01-01 MED ORDER — HEPARIN SOD (PORK) LOCK FLUSH 100 UNIT/ML IV SOLN
500.0000 [IU] | Freq: Once | INTRAVENOUS | Status: AC
  Administered 2015-01-01: 500 [IU] via INTRAVENOUS
  Filled 2015-01-01: qty 5

## 2015-01-01 MED ORDER — ACETAMINOPHEN 325 MG PO TABS
ORAL_TABLET | ORAL | Status: AC
Start: 2015-01-01 — End: 2015-01-01
  Filled 2015-01-01: qty 2

## 2015-01-01 MED ORDER — ACETAMINOPHEN 325 MG PO TABS
650.0000 mg | ORAL_TABLET | Freq: Once | ORAL | Status: AC
  Administered 2015-01-01: 650 mg via ORAL

## 2015-01-01 MED ORDER — SODIUM CHLORIDE 0.9 % IJ SOLN
10.0000 mL | INTRAMUSCULAR | Status: DC | PRN
  Administered 2015-01-01: 10 mL via INTRAVENOUS
  Filled 2015-01-01: qty 10

## 2015-01-01 MED ORDER — ACETAMINOPHEN 325 MG PO TABS
ORAL_TABLET | ORAL | Status: AC
  Filled 2015-01-01: qty 2

## 2015-01-01 MED ORDER — SODIUM CHLORIDE 0.9 % IJ SOLN
3.0000 mL | INTRAMUSCULAR | Status: DC | PRN
  Filled 2015-01-01: qty 10

## 2015-01-01 MED ORDER — SODIUM CHLORIDE 0.9 % IV SOLN
250.0000 mL | Freq: Once | INTRAVENOUS | Status: AC
  Administered 2015-01-01: 250 mL via INTRAVENOUS

## 2015-01-01 NOTE — Patient Instructions (Signed)
Platelet Transfusion  A platelet transfusion is a procedure in which you receive donated platelets through an IV tube. Platelets are tiny pieces of blood cells. When a blood vessel is damaged, platelets collect in the damaged area to help form a blood clot. This begins the healing process. If your platelet count gets too low, your blood may have trouble clotting.  You may need a platelet transfusion if you have a condition that causes a low number of platelets (thrombocytopenia). A platelet transfusion may be used to stop or prevent bleeding.  LET [facility] CARE PROVIDER KNOW ABOUT:   Any allergies you have.   All medicines you are taking, including vitamins, herbs, eye drops, creams, and over-the-counter medicines.   Previous problems you or members of your family have had with the use of anesthetics.   Any blood disorders you have.   Previous surgeries you have had.   Any medical conditions you may have.   Any reactions you have had during a previous transfusion. RISKS AND COMPLICATIONS Generally, this is a safe procedure. However, problems may occur, including:   Fever with or without chills. The fever usually occurs within the first 4 hours of the transfusion and returns to normal within 48 hours.  Allergic reaction. The reaction is most commonly caused by antibodies your body creates against substances in the transfusion. Signs of an allergic reaction may include itching, hives, difficulty breathing, shock, or low blood pressure.  Sudden (acute) or delayed hemolytic reaction. This rare reaction can occur during the transfusion and up to 28 days after the transfusion. The reaction usually occurs when your body's defense system (immune system) attacks the new platelets. Signs of a hemolytic reaction may include fever, headache, difficulty breathing, low blood pressure, a rapid heartbeat, or pain in your back, abdomen, chest, or IV site.  Transfusion-related acute lung injury  (TRALI). TRALI can occur within hours of a transfusion, or several days later. This is a rare reaction that causes lung damage. The cause is not known.  Infection. Signs of this rare complication may include fever, chills, vomiting, a rapid heartbeat, or low blood pressure. BEFORE THE PROCEDURE   You may have a blood test to determine your blood type. This is necessary to find out what kind ofplatelets best matches your platelets.  If you have had an allergic reaction to a transfusion in the past, you may be given medicine to help prevent a reaction. Take this medicine only as directed by your health care provider.  Your temperature, blood pressure, and pulse will be monitored before the transfusion. PROCEDURE  An IV will be started in your hand or arm.  The transfusion will be attached to your IV tubing. The bag of donated platelets will be attached to your IV tube andgiven into your vein.  Your temperature, blood pressure, and pulse will be monitored regularly during the transfusion. This monitoring is done to help detect early signs of a transfusion reaction.  If you have any signs or symptoms of a reaction, your transfusion will be stopped and you may be given medicine.  When your transfusion is complete, your IV will be removed.  Pressure may be applied to the IV site for a few minutes.  A bandage (dressing) will be applied. The procedure may vary among health care providers and hospitals. AFTER THE PROCEDURE  Your blood pressure, temperature, and pulse will be monitored regularly.   This information is not intended to replace advice given to you by your health  care provider. Make sure you discuss any questions you have with your health care provider.   Document Released: 12/08/2006 Document Revised: 03/03/2014 Document Reviewed: 12/21/2013 Elsevier Interactive Patient Education [employer].

## 2015-01-01 NOTE — Patient Instructions (Signed)

## 2015-01-02 LAB — PREPARE PLATELET PHERESIS: Unit division: 0

## 2015-01-08 ENCOUNTER — Telehealth: Payer: Self-pay | Admitting: *Deleted

## 2015-01-08 ENCOUNTER — Ambulatory Visit (HOSPITAL_BASED_OUTPATIENT_CLINIC_OR_DEPARTMENT_OTHER): Payer: BLUE CROSS/BLUE SHIELD | Admitting: Physician Assistant

## 2015-01-08 ENCOUNTER — Ambulatory Visit: Payer: BLUE CROSS/BLUE SHIELD

## 2015-01-08 ENCOUNTER — Telehealth: Payer: Self-pay | Admitting: Internal Medicine

## 2015-01-08 ENCOUNTER — Other Ambulatory Visit: Payer: BLUE CROSS/BLUE SHIELD

## 2015-01-08 ENCOUNTER — Other Ambulatory Visit (HOSPITAL_BASED_OUTPATIENT_CLINIC_OR_DEPARTMENT_OTHER): Payer: BLUE CROSS/BLUE SHIELD

## 2015-01-08 VITALS — BP 113/73 | HR 84 | Temp 98.1°F | Resp 20 | Ht 67.0 in | Wt 110.9 lb

## 2015-01-08 DIAGNOSIS — R0989 Other specified symptoms and signs involving the circulatory and respiratory systems: Secondary | ICD-10-CM

## 2015-01-08 DIAGNOSIS — C7931 Secondary malignant neoplasm of brain: Secondary | ICD-10-CM | POA: Diagnosis not present

## 2015-01-08 DIAGNOSIS — D61818 Other pancytopenia: Secondary | ICD-10-CM | POA: Diagnosis not present

## 2015-01-08 DIAGNOSIS — K1230 Oral mucositis (ulcerative), unspecified: Secondary | ICD-10-CM

## 2015-01-08 DIAGNOSIS — Z95828 Presence of other vascular implants and grafts: Secondary | ICD-10-CM

## 2015-01-08 DIAGNOSIS — R05 Cough: Secondary | ICD-10-CM

## 2015-01-08 DIAGNOSIS — C3490 Malignant neoplasm of unspecified part of unspecified bronchus or lung: Secondary | ICD-10-CM

## 2015-01-08 DIAGNOSIS — Z23 Encounter for immunization: Secondary | ICD-10-CM

## 2015-01-08 DIAGNOSIS — Z86718 Personal history of other venous thrombosis and embolism: Secondary | ICD-10-CM

## 2015-01-08 DIAGNOSIS — R0602 Shortness of breath: Secondary | ICD-10-CM | POA: Diagnosis not present

## 2015-01-08 DIAGNOSIS — J069 Acute upper respiratory infection, unspecified: Secondary | ICD-10-CM

## 2015-01-08 DIAGNOSIS — M545 Low back pain, unspecified: Secondary | ICD-10-CM

## 2015-01-08 DIAGNOSIS — R918 Other nonspecific abnormal finding of lung field: Secondary | ICD-10-CM

## 2015-01-08 LAB — CBC WITH DIFFERENTIAL/PLATELET
BASO%: 0.9 % (ref 0.0–2.0)
Basophils Absolute: 0.1 10*3/uL (ref 0.0–0.1)
EOS ABS: 0 10*3/uL (ref 0.0–0.5)
EOS%: 0.3 % (ref 0.0–7.0)
HCT: 25.9 % — ABNORMAL LOW (ref 38.4–49.9)
HGB: 8.6 g/dL — ABNORMAL LOW (ref 13.0–17.1)
LYMPH#: 0.5 10*3/uL — AB (ref 0.9–3.3)
LYMPH%: 6.6 % — AB (ref 14.0–49.0)
MCH: 33.5 pg — ABNORMAL HIGH (ref 27.2–33.4)
MCHC: 33.4 g/dL (ref 32.0–36.0)
MCV: 100.1 fL — ABNORMAL HIGH (ref 79.3–98.0)
MONO#: 0.8 10*3/uL (ref 0.1–0.9)
MONO%: 10.1 % (ref 0.0–14.0)
NEUT#: 6.8 10*3/uL — ABNORMAL HIGH (ref 1.5–6.5)
NEUT%: 82.1 % — AB (ref 39.0–75.0)
PLATELETS: 74 10*3/uL — AB (ref 140–400)
RBC: 2.58 10*6/uL — AB (ref 4.20–5.82)
RDW: 17.6 % — AB (ref 11.0–14.6)
WBC: 8.3 10*3/uL (ref 4.0–10.3)

## 2015-01-08 LAB — HOLD TUBE, BLOOD BANK

## 2015-01-08 LAB — COMPREHENSIVE METABOLIC PANEL (CC13)
ALT: <9 U/L (ref 0–55)
ANION GAP: 9 meq/L (ref 3–11)
AST: 12 U/L (ref 5–34)
Albumin: 3.2 g/dL — ABNORMAL LOW (ref 3.5–5.0)
Alkaline Phosphatase: 103 U/L (ref 40–150)
BUN: 7.1 mg/dL (ref 7.0–26.0)
CALCIUM: 9.1 mg/dL (ref 8.4–10.4)
CHLORIDE: 102 meq/L (ref 98–109)
CO2: 28 mEq/L (ref 22–29)
Creatinine: 0.8 mg/dL (ref 0.7–1.3)
EGFR: >90 mL/min/{1.73_m2} (ref >90)
Glucose: 134 mg/dl (ref 70–140)
POTASSIUM: 3.6 meq/L (ref 3.5–5.1)
Sodium: 139 mEq/L (ref 136–145)
Total Bilirubin: <0.3 mg/dL (ref 0.20–1.20)
Total Protein: 6.5 g/dL (ref 6.4–8.3)

## 2015-01-08 MED ORDER — OXYCODONE HCL 5 MG PO TABS: 5.0000 mg | ORAL_TABLET | Freq: Four times a day (QID) | ORAL | Status: DC | PRN

## 2015-01-08 MED ORDER — INFLUENZA VAC SPLIT QUAD 0.5 ML IM SUSY
0.5000 mL | PREFILLED_SYRINGE | Freq: Once | INTRAMUSCULAR | Status: AC
  Administered 2015-01-08: 0.5 mL via INTRAMUSCULAR
  Filled 2015-01-08: qty 0.5

## 2015-01-08 MED ORDER — LEVOFLOXACIN 500 MG PO TABS: 500.0000 mg | ORAL_TABLET | Freq: Every day | ORAL | Status: DC

## 2015-01-08 MED ORDER — HEPARIN SOD (PORK) LOCK FLUSH 100 UNIT/ML IV SOLN
500.0000 [IU] | Freq: Once | INTRAVENOUS | Status: DC
  Filled 2015-01-08: qty 5

## 2015-01-08 MED ORDER — LEVOFLOXACIN 500 MG PO TABS: 500.0000 mg | ORAL_TABLET | Freq: Every day | ORAL | Status: AC

## 2015-01-08 MED ORDER — MORPHINE SULFATE ER 30 MG PO TBCR: 30.0000 mg | EXTENDED_RELEASE_TABLET | Freq: Two times a day (BID) | ORAL | Status: DC

## 2015-01-08 MED ORDER — SODIUM CHLORIDE 0.9 % IJ SOLN
10.0000 mL | INTRAMUSCULAR | Status: DC | PRN
  Administered 2015-01-08: 10 mL via INTRAVENOUS
  Filled 2015-01-08: qty 10

## 2015-01-08 NOTE — Telephone Encounter (Signed)
Gave and pritned appt sched and avs for pt for NOV °

## 2015-01-08 NOTE — Telephone Encounter (Signed)
Per staff message and POF I have scheduled appts. Advised scheduler of appts and first available given on 11/21. JMW

## 2015-01-08 NOTE — Progress Notes (Signed)
[place] [phone]   Fax:(336) [place], [place] [address] 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  PRIOR THERAPY: 1. First-line chemotherapy with 5 cycles of carboplatin (cisplatin for the first cycle) and etoposide from 05/18/2013 to 10/15/2013.  2. WB XRT and palliative chest radiation given on 05/03/13 - 05/18/13 per Dr. Pablo Ledger.  CURRENT THERAPY: Systemic chemotherapy with carboplatin for AUC of 5 on day 1 and etoposide 100 MG/M2 on days 1, 2 and 3 with Neulasta support on day 4. Dose was reduced on Cycle 5 to carboplatin to AUC of 4 on day 1 and etoposide at 80 MG/M2 on days 1, 2 and 3 with Neulasta support on day 4.  INTERVAL HISTORY: Ricky Villanueva 64 y.o. male returns to the clinic today for follow-up visit accompanied by his wife, daughter, and son. He tolerated cycle 5 well.  Denies bleeding. He denied having any significant chest pain but has mild shortness of breath and has been coughing. He suspects upper respiratory infection. He has no chest pain or hemoptysis. He denied having any vomiting. He has no fever or chills.  He denies any mucositis.  Appetite is fair. He has not lost any additional weight. He denies any bleeding issues. Pain is controlled with MS Contin and Oxycodone. He wants refill on these meds. He reports that sometime he needs to take 10 mg of oxycodone to control his pain. He wants a flu shot today.  He is scheduled to receive cycle #6 of his chemo today.  MEDICAL HISTORY: Past Medical History  Diagnosis Date  . Hyperlipidemia   . Hypertension   . GERD (gastroesophageal reflux disease)   . Respiratory failure with hypoxia ([place]) 04/21/2013    secondary to pneumonia/notes 04/21/2013  . Pulmonary embolism ([place])     "got one in there now" (04/21/2013)  . COPD (chronic obstructive  pulmonary disease) ([place])   . Pneumonia 2/?01/2014    "wouldn't get better; hospitalized 04/21/2013)  . Arthritis     "minor in my right hand" (04/21/2013)  . History of radiation therapy 05/03/13-05/18/13    chest & whole brain  . Cancer ([place])     lung ca dx'd 03/2013  . Colon polyps     hyperplastic  . Right arm pain 08/21/2014    ALLERGIES:  is allergic to decadron and augmentin.  MEDICATIONS:  Current Outpatient Prescriptions  Medication Sig Dispense Refill  . chlorpheniramine-HYDROcodone (TUSSIONEX PENNKINETIC ER) 10-8 MG/5ML SUER Take 5 mLs by mouth every 12 (twelve) hours as needed for cough. 140 mL 0  . dextromethorphan 15 MG/5ML syrup Take 10 mLs by mouth as needed for cough.    . docusate sodium (COLACE) 100 MG capsule Take 300 mg by mouth 2 (two) times daily as needed for mild constipation.     . Iron-Vitamins (GERITOL PO) Take 1 tablet by mouth daily.    [name] levofloxacin (LEVAQUIN) 500 MG tablet Take 1 tablet (500 mg total) by mouth daily. 7 tablet 0  . lidocaine-prilocaine (EMLA) cream Apply to port site one hour before treatment and cover with plastic wrap. 1 each 2  . LORazepam (ATIVAN) 0.5 MG tablet Take 0.5 mg by mouth 2 (two) times daily as needed.     . magnesium hydroxide (MILK OF MAGNESIA) 400 MG/5ML suspension Take 5 mLs by mouth daily as needed for  mild constipation.    . mirtazapine (REMERON) 30 MG tablet Take 1 tablet (30 mg total) by mouth at bedtime. 90 tablet 1  . morphine (MS CONTIN) 30 MG 12 hr tablet Take 1 tablet (30 mg total) by mouth every 12 (twelve) hours. 60 tablet 0  . omeprazole (PRILOSEC) 40 MG capsule TAKE 1 CAPSULE (40 MG TOTAL)  BY MOUTH DAILY. 30 capsule 0  . ondansetron (ZOFRAN) 8 MG tablet Take 8 mg by mouth every 8 (eight) hours as needed.    [name] oxyCODONE (OXY IR/ROXICODONE) 5 MG immediate release tablet Take 1 tablet (5 mg total) by mouth every 6 (six) hours as needed for severe pain or breakthrough pain. May take up to ii po q 6rhs prn for severe  pain 120 tablet 0  . polyethylene glycol powder (MIRALAX) powder Take 17 g by mouth daily. (Patient not taking: Reported on 12/25/2014) 255 g 0  . PROAIR HFA 108 (90 BASE) MCG/ACT inhaler USE 2 INHALATIONS EVERY 6 HOURS AS NEEDED FOR WHEEZING OR SHORTNESS OF BREATH (Patient not taking: Reported on 12/25/2014) 8.5 g 1  . prochlorperazine (COMPAZINE) 10 MG tablet Take 1 tablet (10 mg total) by mouth every 6 (six) hours as needed for nausea or vomiting (nausea). 30 tablet 3  . VENTOLIN HFA 108 (90 BASE) MCG/ACT inhaler Inhale 2 puffs into the lungs every 6 (six) hours as needed.     . vitamin B-12 (CYANOCOBALAMIN) 100 MCG tablet Take 100 mcg by mouth daily.    Alveda Reasons 20 MG TABS tablet TAKE 1 TABLET (20 MG TOTAL) BY MOUTH DAILY WITH SUPPER. 30 tablet 0   No current facility-administered medications for this visit.   Facility-Administered Medications Ordered in Other Visits  Medication Dose Route Frequency Provider Last Rate Last Dose  . 0.9 %  sodium chloride infusion  1,000 mL Intravenous Once Concha Norway, MD      . 0.9 %  sodium chloride infusion   Intravenous Once Concha Norway, MD        SURGICAL HISTORY:  Past Surgical History  Procedure Laterality Date  . Inguinal hernia repair Right ~ 2005  . Finger surgery Left ~ 1989    "crushed end of my middle finger off"   . Video bronchoscopy Bilateral 04/26/2013    Procedure: VIDEO BRONCHOSCOPY WITH FLUORO;  Surgeon: Wilhelmina Mcardle, MD;  Location: [facility] ENDOSCOPY;  Service: Cardiopulmonary;  Laterality: Bilateral;    REVIEW OF SYSTEMS:  Constitutional: positive for fatigue, upper respiratory symptoms Eyes: negative Ears, nose, mouth, throat, and face: negative Respiratory: he feels he is getting a "cold with cough"  Cardiovascular: negative Gastrointestinal: negative Genitourinary:negative Integument/breast: negative Hematologic/lymphatic: negative Musculoskeletal:negative Neurological: negative Behavioral/Psych: negative Endocrine:  negative Allergic/Immunologic: negative   PHYSICAL EXAMINATION: General appearance: alert, cooperative, fatigued and no distress Head: Normocephalic, without obvious abnormality, atraumatic Neck: no adenopathy, no JVD, supple, symmetrical, trachea midline and thyroid not enlarged, symmetric, no tenderness/mass/nodules Lymph nodes: Cervical, supraclavicular, and axillary nodes normal. Resp: clear to auscultation bilaterally Back: symmetric, no curvature. ROM normal. No CVA tenderness. Cardio: regular rate and rhythm, S1, S2 normal, no murmur, click, rub or gallop GI: soft, non-tender; bowel sounds normal; no masses,  no organomegaly Extremities: extremities normal, atraumatic, no cyanosis or edema Neurologic: Alert and oriented X 3, normal strength and tone. Normal symmetric reflexes. Normal coordination and gait  ECOG PERFORMANCE STATUS: 1 - Symptomatic but completely ambulatory  Blood pressure 113/73, pulse 84, temperature 98.1 F (36.7 C), temperature source Oral, resp. rate 20,  height '5\' 7"'$  (1.702 m), weight 110 lb 14.4 oz (50.304 kg), SpO2 94 %.  LABORATORY DATA: CBC Latest Ref Rng 01/08/2015 01/01/2015 12/25/2014  WBC 4.0 - 10.3 10e3/uL 8.3 3.1(L) 12.2(H)  Hemoglobin 13.0 - 17.1 g/dL 8.6(L) 8.4(L) 9.9(L)  Hematocrit 38.4 - 49.9 % 25.9(L) 24.6(L) 30.2(L)  Platelets 140 - 400 10e3/uL 74(L) 7(LL) 45(L)   CMP Latest Ref Rng 01/08/2015 01/01/2015 12/25/2014  Glucose 70 - 140 mg/dl 134 137 115  BUN 7.0 - 26.0 mg/dL 7.1 8.7 11.8  Creatinine 0.7 - 1.3 mg/dL 0.8 0.8 0.7  Sodium 136 - 145 mEq/L 139 140 139  Potassium 3.5 - 5.1 mEq/L 3.6 4.0 4.6  Chloride 101 - 111 mmol/L - - -  CO2 22 - 29 mEq/L 28 30(H) 32(H)  Calcium 8.4 - 10.4 mg/dL 9.1 9.3 9.3  Total Protein 6.4 - 8.3 g/dL 6.5 6.2(L) 6.4  Total Bilirubin 0.20 - 1.20 mg/dL <0.30 <0.30 0.65  Alkaline Phos 40 - 150 U/L 103 104 143  AST 5 - 34 U/L '12 14 16  '$ ALT 0 - 55 U/L '9 14 15     '$ RADIOGRAPHIC STUDIES: No results  found.  ASSESSMENT AND PLAN: This is a very pleasant 64 year old white male with extensive stage small cell lung cancer status post palliative radiotherapy to the brain and chest as well as 6 cycles of systemic chemotherapy with carboplatin and etoposide completed in August 2015. The patient has been observation for around 10 months. Repeat CT scan of the chest, abdomen and pelvis showed evidence for disease progression with multiple ill-defined soft tissue masses in the right lower lobe and an enlarging subdiaphragmatic lymph nodes and a new suspicious hepatic metastasis. He is currently on systemic chemotherapy with carboplatin and etoposide, status post 5 cycles and tolerating his treatment fairly well except for the pancytopenia associated with weakness and fatigue.  He received his last platelet transfusion on 01/01/15 . His hemoglobin and platelets are stable  The patient was seen and discussed with Dr. [name]. Recommended for the patient to hold cycle 6 of his chemo today due to low platelet counts and to recover from his possible upper respiratory infection. Carboplatin dose was be reduced to an AUC of 4 and the etoposide was reduced to a dose of 80 mg/m2.   For the history of deep venous thrombosis, he will continue his treatment with Xarelto.  The patient will have weekly labs and return for a visit in 1 weeks prior to cycle #6  A refill for Oxycodone and MSIR was given to the patient. He knows to take bowel support.  A prescription for Levaquin 500 mg daily for 7 days was sent to the pharmacy Staging CTs were ordered for 4 weeks from now, at which time will evaluate response to treatment.  The patient was advised to call if he has any concerning symptoms in the interval. The patient voices understanding of current disease status and treatment options and is in agreement with the current care plan.  All questions were answered. The patient knows to call the clinic with any problems,  questions or concerns. We can certainly see the patient much sooner if necessary.  Rondel Jumbo, PA-C 01/08/2015    ADDENDUM: Hematology/Oncology Attending: I had a face to face encounter with the patient today. I recommended his care plan. This is a very pleasant 64 years old white male with recurrent small cell lung cancer currently undergoing second course of systemic chemotherapy with reduced dose carboplatin and  etoposide status post 5 cycles. He has been tolerating his treatment well except for pancytopenia and he required platelet and PRBCs transfusion recently. His platelets count are still low today at 74,000. I recommended for the patient to delay the start of cycle #6 by 1 week until recovery of his platelets count. He also has mild chest congestion and productive cough concerning for acute bronchitis. We will start the patient on Levaquin 500 mg by mouth daily for 7 days. For the history of deep venous thrombosis, the patient will continue on treatment with Xarelto. For pain management he was given a refill of oxycodone and MSIR. The patient would come back for follow-up visit in one week for reevaluation before starting cycle #6. He was advised to call immediately if he has any concerning symptoms in the interval.  Disclaimer: This note was dictated with voice recognition software. Similar sounding words can inadvertently be transcribed and may be missed upon review.   Eilleen Kempf., MD 01/08/2015

## 2015-01-08 NOTE — Telephone Encounter (Signed)
lvm fo rpt regarding to [place] [facility] appt moved to later time due to chemo availability

## 2015-01-08 NOTE — Telephone Encounter (Signed)
See other entry 

## 2015-01-08 NOTE — Telephone Encounter (Signed)
Ricky Villanueva patient

## 2015-01-08 NOTE — Telephone Encounter (Signed)
Call received in [place] from pt's wife inquiring if pt could receive flu shot this Friday after scheduled lab.  Pt is on [place] presently.  Request per above will be sent to MD/nurse for ok - will need order and POF per flu shot request.

## 2015-01-09 ENCOUNTER — Ambulatory Visit: Payer: BLUE CROSS/BLUE SHIELD

## 2015-01-10 ENCOUNTER — Ambulatory Visit: Payer: BLUE CROSS/BLUE SHIELD

## 2015-01-11 ENCOUNTER — Ambulatory Visit: Payer: BLUE CROSS/BLUE SHIELD

## 2015-01-15 ENCOUNTER — Other Ambulatory Visit: Payer: BLUE CROSS/BLUE SHIELD

## 2015-01-15 ENCOUNTER — Ambulatory Visit: Payer: BLUE CROSS/BLUE SHIELD | Admitting: Internal Medicine

## 2015-01-15 ENCOUNTER — Ambulatory Visit (HOSPITAL_BASED_OUTPATIENT_CLINIC_OR_DEPARTMENT_OTHER): Payer: BLUE CROSS/BLUE SHIELD | Admitting: Oncology

## 2015-01-15 ENCOUNTER — Ambulatory Visit: Payer: BLUE CROSS/BLUE SHIELD

## 2015-01-15 ENCOUNTER — Ambulatory Visit (HOSPITAL_BASED_OUTPATIENT_CLINIC_OR_DEPARTMENT_OTHER): Payer: BLUE CROSS/BLUE SHIELD

## 2015-01-15 ENCOUNTER — Encounter: Payer: Self-pay | Admitting: Oncology

## 2015-01-15 ENCOUNTER — Other Ambulatory Visit (HOSPITAL_BASED_OUTPATIENT_CLINIC_OR_DEPARTMENT_OTHER): Payer: BLUE CROSS/BLUE SHIELD

## 2015-01-15 ENCOUNTER — Telehealth: Payer: Self-pay | Admitting: Oncology

## 2015-01-15 ENCOUNTER — Ambulatory Visit: Payer: BLUE CROSS/BLUE SHIELD | Admitting: Oncology

## 2015-01-15 VITALS — BP 117/86 | HR 104 | Temp 97.9°F | Resp 18 | Ht 67.0 in | Wt 109.1 lb

## 2015-01-15 DIAGNOSIS — Z95828 Presence of other vascular implants and grafts: Secondary | ICD-10-CM

## 2015-01-15 DIAGNOSIS — C3490 Malignant neoplasm of unspecified part of unspecified bronchus or lung: Secondary | ICD-10-CM

## 2015-01-15 DIAGNOSIS — M79601 Pain in right arm: Secondary | ICD-10-CM

## 2015-01-15 DIAGNOSIS — C3491 Malignant neoplasm of unspecified part of right bronchus or lung: Secondary | ICD-10-CM

## 2015-01-15 DIAGNOSIS — Z5111 Encounter for antineoplastic chemotherapy: Secondary | ICD-10-CM

## 2015-01-15 LAB — CBC WITH DIFFERENTIAL/PLATELET
BASO%: 0.5 % (ref 0.0–2.0)
Basophils Absolute: 0 10*3/uL (ref 0.0–0.1)
EOS ABS: 0 10*3/uL (ref 0.0–0.5)
EOS%: 0.1 % (ref 0.0–7.0)
HEMATOCRIT: 30.4 % — AB (ref 38.4–49.9)
HEMOGLOBIN: 10 g/dL — AB (ref 13.0–17.1)
LYMPH#: 0.6 10*3/uL — AB (ref 0.9–3.3)
LYMPH%: 6.5 % — AB (ref 14.0–49.0)
MCH: 33.6 pg — ABNORMAL HIGH (ref 27.2–33.4)
MCHC: 33 g/dL (ref 32.0–36.0)
MCV: 101.9 fL — AB (ref 79.3–98.0)
MONO#: 0.9 10*3/uL (ref 0.1–0.9)
MONO%: 10.1 % (ref 0.0–14.0)
NEUT%: 82.8 % — ABNORMAL HIGH (ref 39.0–75.0)
NEUTROS ABS: 7.3 10*3/uL — AB (ref 1.5–6.5)
PLATELETS: 263 10*3/uL (ref 140–400)
RBC: 2.98 10*6/uL — AB (ref 4.20–5.82)
RDW: 20.1 % — AB (ref 11.0–14.6)
WBC: 8.8 10*3/uL (ref 4.0–10.3)

## 2015-01-15 LAB — COMPREHENSIVE METABOLIC PANEL (CC13)
ALT: 9 U/L (ref 0–55)
AST: 16 U/L (ref 5–34)
Albumin: 3.2 g/dL — ABNORMAL LOW (ref 3.5–5.0)
Alkaline Phosphatase: 99 U/L (ref 40–150)
Anion Gap: 8 mEq/L (ref 3–11)
BUN: 10.1 mg/dL (ref 7.0–26.0)
CALCIUM: 9.4 mg/dL (ref 8.4–10.4)
CHLORIDE: 99 meq/L (ref 98–109)
CO2: 30 mEq/L — ABNORMAL HIGH (ref 22–29)
CREATININE: 0.9 mg/dL (ref 0.7–1.3)
EGFR: 87 mL/min/{1.73_m2} — ABNORMAL LOW (ref >90)
Glucose: 155 mg/dl — ABNORMAL HIGH (ref 70–140)
Potassium: 4 mEq/L (ref 3.5–5.1)
Sodium: 136 mEq/L (ref 136–145)
Total Protein: 6.6 g/dL (ref 6.4–8.3)

## 2015-01-15 LAB — HOLD TUBE, BLOOD BANK

## 2015-01-15 MED ORDER — CARBOPLATIN CHEMO INJECTION 450 MG/45ML
364.0000 mg | Freq: Once | INTRAVENOUS | Status: AC
  Administered 2015-01-15: 360 mg via INTRAVENOUS
  Filled 2015-01-15: qty 36

## 2015-01-15 MED ORDER — ONDANSETRON HCL 40 MG/20ML IJ SOLN
Freq: Once | INTRAMUSCULAR | Status: AC
  Administered 2015-01-15: 15:00:00 via INTRAVENOUS
  Filled 2015-01-15: qty 8

## 2015-01-15 MED ORDER — SODIUM CHLORIDE 0.9 % IJ SOLN
10.0000 mL | INTRAMUSCULAR | Status: DC | PRN
  Administered 2015-01-15: 10 mL
  Filled 2015-01-15: qty 10

## 2015-01-15 MED ORDER — SODIUM CHLORIDE 0.9 % IV SOLN
80.0000 mg/m2 | Freq: Once | INTRAVENOUS | Status: AC
  Administered 2015-01-15: 120 mg via INTRAVENOUS
  Filled 2015-01-15: qty 6

## 2015-01-15 MED ORDER — CARBOPLATIN CHEMO INTRADERMAL TEST DOSE 100MCG/0.02ML
100.0000 ug | Freq: Once | INTRADERMAL | Status: AC
  Administered 2015-01-15: 100 ug via INTRADERMAL
  Filled 2015-01-15: qty 0.01

## 2015-01-15 MED ORDER — HEPARIN SOD (PORK) LOCK FLUSH 100 UNIT/ML IV SOLN
500.0000 [IU] | Freq: Once | INTRAVENOUS | Status: AC | PRN
  Administered 2015-01-15: 500 [IU]
  Filled 2015-01-15: qty 5

## 2015-01-15 MED ORDER — SODIUM CHLORIDE 0.9 % IJ SOLN
10.0000 mL | INTRAMUSCULAR | Status: DC | PRN
  Administered 2015-01-15: 10 mL via INTRAVENOUS
  Filled 2015-01-15: qty 10

## 2015-01-15 MED ORDER — SODIUM CHLORIDE 0.9 % IV SOLN
Freq: Once | INTRAVENOUS | Status: AC
  Administered 2015-01-15: 15:00:00 via INTRAVENOUS

## 2015-01-15 NOTE — Patient Instructions (Signed)
[facility] Discharge Instructions for Patients Receiving Chemotherapy  Today you received the following chemotherapy agents Carboplatin and Etoposide  To help prevent nausea and vomiting after your treatment, we encourage you to take your nausea medication as directed If you develop nausea and vomiting that is not controlled by your nausea medication, call the clinic.   BELOW ARE SYMPTOMS THAT SHOULD BE REPORTED IMMEDIATELY:  *FEVER GREATER THAN 100.5 F  *CHILLS WITH OR WITHOUT FEVER  NAUSEA AND VOMITING THAT IS NOT CONTROLLED WITH YOUR NAUSEA MEDICATION  *UNUSUAL SHORTNESS OF BREATH  *UNUSUAL BRUISING OR BLEEDING  TENDERNESS IN MOUTH AND THROAT WITH OR WITHOUT PRESENCE OF ULCERS  *URINARY PROBLEMS  *BOWEL PROBLEMS  UNUSUAL RASH Items with * indicate a potential emergency and should be followed up as soon as possible.  Feel free to call the clinic you have any questions or concerns. The clinic phone number is [phone].  Please show the CHEMO ALERT CARD at check-in to the Emergency Department and triage nurse.   

## 2015-01-15 NOTE — Telephone Encounter (Signed)
per pof to sch pt appt-gave pt copy of avs °

## 2015-01-15 NOTE — Patient Instructions (Signed)

## 2015-01-15 NOTE — Progress Notes (Signed)
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  PRIOR THERAPY: 1. First-line chemotherapy with 5 cycles of carboplatin (cisplatin for the first cycle) and etoposide from 05/18/2013 to 10/15/2013.  2. WB XRT and palliative chest radiation given on 05/03/13 - 05/18/13 per Dr. Pablo Ledger.  CURRENT THERAPY: Systemic chemotherapy with carboplatin for AUC of 5 on day 1 and etoposide 100 MG/M2 on days 1, 2 and 3 with Neulasta support on day 4. Dose was reduced on Cycle 5 to carboplatin to AUC of 4 on day 1 and etoposide at 80 MG/M2 on days 1, 2 and 3 with Neulasta support on day 4.  INTERVAL HISTORY: Ricky Villanueva 64 y.o. male returns to the clinic today for follow-up visit accompanied by his wife and daughter. Cycle 6 is been delayed due to thrombocytopenia. Denies bleeding. He recently completed a course of Levaquin for upper respiratory symptoms. Cough and shortness of breath have improved. Denies fevers. He has no chest pain or hemoptysis. He denied having any vomiting. He has no fever or chills.  He denies any mucositis.  Appetite is fair. He has not lost any additional weight. Pain is controlled with MS Contin and Oxycodone. He is here for evaluation prior to cycle 6 of his chemotherapy today.  MEDICAL HISTORY: Past Medical History  Diagnosis Date  . Hyperlipidemia   . Hypertension   . GERD (gastroesophageal reflux disease)   . Respiratory failure with hypoxia ([place]) 04/21/2013    secondary to pneumonia/notes 04/21/2013  . Pulmonary embolism ([place])     "got one in there now" (04/21/2013)  . COPD (chronic obstructive pulmonary disease) ([place])   . Pneumonia 2/?01/2014    "wouldn't get better; hospitalized 04/21/2013)  . Arthritis     "minor in my right hand" (04/21/2013)    . History of radiation therapy 05/03/13-05/18/13    chest & whole brain  . Cancer ([place])     lung ca dx'd 03/2013  . Colon polyps     hyperplastic  . Right arm pain 08/21/2014    ALLERGIES:  is allergic to decadron and augmentin.  MEDICATIONS:  Current Outpatient Prescriptions  Medication Sig Dispense Refill  . chlorpheniramine-HYDROcodone (TUSSIONEX PENNKINETIC ER) 10-8 MG/5ML SUER Take 5 mLs by mouth every 12 (twelve) hours as needed for cough. 140 mL 0  . dextromethorphan 15 MG/5ML syrup Take 10 mLs by mouth as needed for cough.    . docusate sodium (COLACE) 100 MG capsule Take 300 mg by mouth 2 (two) times daily as needed for mild constipation.     . Iron-Vitamins (GERITOL PO) Take 1 tablet by mouth daily.    [name] lidocaine-prilocaine (EMLA) cream Apply to port site one hour before treatment and cover with plastic wrap. 1 each 2  . LORazepam (ATIVAN) 0.5 MG tablet Take 0.5 mg by mouth 2 (two) times daily as needed.     . magnesium hydroxide (MILK OF MAGNESIA) 400 MG/5ML suspension Take 5 mLs by mouth daily as needed for mild constipation.    . mirtazapine (REMERON) 30 MG tablet Take 1 tablet (30 mg total) by mouth at bedtime. 90 tablet 1  . morphine (MS CONTIN) 30 MG 12 hr tablet Take 1 tablet (30 mg total) by mouth every 12 (twelve) hours. 60 tablet 0  .  omeprazole (PRILOSEC) 40 MG capsule TAKE 1 CAPSULE (40 MG TOTAL)  BY MOUTH DAILY. 30 capsule 0  . ondansetron (ZOFRAN) 8 MG tablet Take 8 mg by mouth every 8 (eight) hours as needed.    [name] oxyCODONE (OXY IR/ROXICODONE) 5 MG immediate release tablet Take 1 tablet (5 mg total) by mouth every 6 (six) hours as needed for severe pain or breakthrough pain. May take up to ii po q 6rhs prn for severe pain 120 tablet 0  . polyethylene glycol powder (MIRALAX) powder Take 17 g by mouth daily. 255 g 0  . PROAIR HFA 108 (90 BASE) MCG/ACT inhaler USE 2 INHALATIONS EVERY 6 HOURS AS NEEDED FOR WHEEZING OR SHORTNESS OF BREATH 8.5 g 1  . prochlorperazine  (COMPAZINE) 10 MG tablet Take 1 tablet (10 mg total) by mouth every 6 (six) hours as needed for nausea or vomiting (nausea). 30 tablet 3  . SPIRIVA HANDIHALER 18 MCG inhalation capsule As directed    . VENTOLIN HFA 108 (90 BASE) MCG/ACT inhaler Inhale 2 puffs into the lungs every 6 (six) hours as needed.     . vitamin B-12 (CYANOCOBALAMIN) 100 MCG tablet Take 100 mcg by mouth daily.    Alveda Reasons 20 MG TABS tablet TAKE 1 TABLET (20 MG TOTAL) BY MOUTH DAILY WITH SUPPER. 30 tablet 0   No current facility-administered medications for this visit.   Facility-Administered Medications Ordered in Other Visits  Medication Dose Route Frequency Provider Last Rate Last Dose  . 0.9 %  sodium chloride infusion  1,000 mL Intravenous Once Concha Norway, MD      . 0.9 %  sodium chloride infusion   Intravenous Once Concha Norway, MD      . 0.9 %  sodium chloride infusion   Intravenous Once Curt Bears, MD      . heparin lock flush 100 unit/mL  500 Units Intracatheter Once PRN Curt Bears, MD      . sodium chloride 0.9 % injection 10 mL  10 mL Intracatheter PRN Curt Bears, MD        SURGICAL HISTORY:  Past Surgical History  Procedure Laterality Date  . Inguinal hernia repair Right ~ 2005  . Finger surgery Left ~ 1989    "crushed end of my middle finger off"   . Video bronchoscopy Bilateral 04/26/2013    Procedure: VIDEO BRONCHOSCOPY WITH FLUORO;  Surgeon: Wilhelmina Mcardle, MD;  Location: [facility] ENDOSCOPY;  Service: Cardiopulmonary;  Laterality: Bilateral;    REVIEW OF SYSTEMS:  Constitutional: positive for fatigue, upper respiratory symptoms Eyes: negative Ears, nose, mouth, throat, and face: negative Respiratory: Negative  Cardiovascular: negative Gastrointestinal: negative Genitourinary:negative Integument/breast: negative Hematologic/lymphatic: negative Musculoskeletal:negative Neurological: negative Behavioral/Psych: negative Endocrine: negative Allergic/Immunologic: negative   PHYSICAL  EXAMINATION: General appearance: alert, cooperative, fatigued and no distress Head: Normocephalic, without obvious abnormality, atraumatic Neck: no adenopathy, no JVD, supple, symmetrical, trachea midline and thyroid not enlarged, symmetric, no tenderness/mass/nodules Lymph nodes: Cervical, supraclavicular, and axillary nodes normal. Resp: clear to auscultation bilaterally Back: symmetric, no curvature. ROM normal. No CVA tenderness. Cardio: regular rate and rhythm, S1, S2 normal, no murmur, click, rub or gallop GI: soft, non-tender; bowel sounds normal; no masses,  no organomegaly Extremities: extremities normal, atraumatic, no cyanosis or edema Neurologic: Alert and oriented X 3, normal strength and tone. Normal symmetric reflexes. Normal coordination and gait  ECOG PERFORMANCE STATUS: 1 - Symptomatic but completely ambulatory  Blood pressure 117/86, pulse 104, temperature 97.9 F (36.6 C), temperature source Oral, resp. rate  18, height '5\' 7"'$  (1.702 m), weight 109 lb 1.6 oz (49.487 kg), SpO2 95 %.  LABORATORY DATA: CBC Latest Ref Rng 01/15/2015 01/08/2015 01/01/2015  WBC 4.0 - 10.3 10e3/uL 8.8 8.3 3.1(L)  Hemoglobin 13.0 - 17.1 g/dL 10.0(L) 8.6(L) 8.4(L)  Hematocrit 38.4 - 49.9 % 30.4(L) 25.9(L) 24.6(L)  Platelets 140 - 400 10e3/uL 263 74(L) 7(LL)   CMP Latest Ref Rng 01/15/2015 01/08/2015 01/01/2015  Glucose 70 - 140 mg/dl 155(H) 134 137  BUN 7.0 - 26.0 mg/dL 10.1 7.1 8.7  Creatinine 0.7 - 1.3 mg/dL 0.9 0.8 0.8  Sodium 136 - 145 mEq/L 136 139 140  Potassium 3.5 - 5.1 mEq/L 4.0 3.6 4.0  Chloride 101 - 111 mmol/L - - -  CO2 22 - 29 mEq/L 30(H) 28 30(H)  Calcium 8.4 - 10.4 mg/dL 9.4 9.1 9.3  Total Protein 6.4 - 8.3 g/dL 6.6 6.5 6.2(L)  Total Bilirubin 0.20 - 1.20 mg/dL <0.30 <0.30 <0.30  Alkaline Phos 40 - 150 U/L 99 103 104  AST 5 - 34 U/L '16 12 14  '$ ALT 0 - 55 U/L 9 <9 14     RADIOGRAPHIC STUDIES: No results found.  ASSESSMENT AND PLAN: This is a very pleasant 64 year old  white male with extensive stage small cell lung cancer status post palliative radiotherapy to the brain and chest as well as 6 cycles of systemic chemotherapy with carboplatin and etoposide completed in August 2015. The patient has been observation for around 10 months. Repeat CT scan of the chest, abdomen and pelvis showed evidence for disease progression with multiple ill-defined soft tissue masses in the right lower lobe and an enlarging subdiaphragmatic lymph nodes and a new suspicious hepatic metastasis. He is currently on systemic chemotherapy with carboplatin and etoposide, status post 5 cycles and tolerating his treatment fairly well except for the pancytopenia associated with weakness and fatigue.   The patient was seen and discussed with Dr. [name]. Recommended for the patient to proceed with cycle 6 of his chemo today as scheduled.   The patient will have weekly labs for close monitoring of his blood counts. He is scheduled to have a restaging CT on 02/07/2015. He will be seen for follow-up after that scan to go over the results.  The patient was advised to call if he has any concerning symptoms in the interval. The patient voices understanding of current disease status and treatment options and is in agreement with the current care plan.  All questions were answered. The patient knows to call the clinic with any problems, questions or concerns. We can certainly see the patient much sooner if necessary.  Mikey Bussing, NP 01/15/2015  ADDENDUM: Hematology/Oncology Attending: I had a face to face encounter with the patient today. I recommended his care plan. This is a very pleasant 63 years old white male with small cell lung cancer with disease recurrence and he is currently undergoing second course of systemic chemotherapy with reduced dose carboplatin and etoposide status post 5 cycles. The patient has been tolerating his treatment fairly well except for pancytopenia. His treatment  was delayed from last week. His platelets count has improved. I recommended for the patient to proceed with cycle #6 today as a scheduled. He would come back for follow-up visit in 3-4 weeks after repeating CT scan of the chest, abdomen and pelvis for restaging of his disease. The patient was advised to call immediately if he has any concerning symptoms in the interval.  Disclaimer: This note was dictated  with voice recognition software. Similar sounding words can inadvertently be transcribed and may be missed upon review.  Eilleen Kempf., MD 01/15/2015

## 2015-01-16 ENCOUNTER — Ambulatory Visit (HOSPITAL_BASED_OUTPATIENT_CLINIC_OR_DEPARTMENT_OTHER): Payer: BLUE CROSS/BLUE SHIELD

## 2015-01-16 VITALS — BP 119/80 | HR 88 | Temp 98.0°F | Resp 18

## 2015-01-16 DIAGNOSIS — C3490 Malignant neoplasm of unspecified part of unspecified bronchus or lung: Secondary | ICD-10-CM

## 2015-01-16 DIAGNOSIS — Z5111 Encounter for antineoplastic chemotherapy: Secondary | ICD-10-CM | POA: Diagnosis not present

## 2015-01-16 DIAGNOSIS — C3491 Malignant neoplasm of unspecified part of right bronchus or lung: Secondary | ICD-10-CM

## 2015-01-16 MED ORDER — SODIUM CHLORIDE 0.9 % IJ SOLN
10.0000 mL | INTRAMUSCULAR | Status: DC | PRN
  Administered 2015-01-16: 10 mL
  Filled 2015-01-16: qty 10

## 2015-01-16 MED ORDER — SODIUM CHLORIDE 0.9 % IV SOLN
80.0000 mg/m2 | Freq: Once | INTRAVENOUS | Status: AC
  Administered 2015-01-16: 120 mg via INTRAVENOUS
  Filled 2015-01-16: qty 6

## 2015-01-16 MED ORDER — PROCHLORPERAZINE MALEATE 10 MG PO TABS
10.0000 mg | ORAL_TABLET | Freq: Once | ORAL | Status: AC
  Administered 2015-01-16: 10 mg via ORAL

## 2015-01-16 MED ORDER — PROCHLORPERAZINE MALEATE 10 MG PO TABS
ORAL_TABLET | ORAL | Status: AC
  Filled 2015-01-16: qty 1

## 2015-01-16 MED ORDER — SODIUM CHLORIDE 0.9 % IV SOLN
Freq: Once | INTRAVENOUS | Status: AC
  Administered 2015-01-16: 08:00:00 via INTRAVENOUS

## 2015-01-16 MED ORDER — HEPARIN SOD (PORK) LOCK FLUSH 100 UNIT/ML IV SOLN
500.0000 [IU] | Freq: Once | INTRAVENOUS | Status: AC | PRN
  Administered 2015-01-16: 500 [IU]
  Filled 2015-01-16: qty 5

## 2015-01-16 NOTE — Patient Instructions (Signed)
[facility] Discharge Instructions for Patients Receiving Chemotherapy  Today you received the following chemotherapy agents: Etoposide   To help prevent nausea and vomiting after your treatment, we encourage you to take your nausea medications as directed.    If you develop nausea and vomiting that is not controlled by your nausea medication, call the clinic.   BELOW ARE SYMPTOMS THAT SHOULD BE REPORTED IMMEDIATELY:  *FEVER GREATER THAN 100.5 F  *CHILLS WITH OR WITHOUT FEVER  NAUSEA AND VOMITING THAT IS NOT CONTROLLED WITH YOUR NAUSEA MEDICATION  *UNUSUAL SHORTNESS OF BREATH  *UNUSUAL BRUISING OR BLEEDING  TENDERNESS IN MOUTH AND THROAT WITH OR WITHOUT PRESENCE OF ULCERS  *URINARY PROBLEMS  *BOWEL PROBLEMS  UNUSUAL RASH Items with * indicate a potential emergency and should be followed up as soon as possible.  Feel free to call the clinic you have any questions or concerns. The clinic phone number is [phone].  Please show the [facility] at check-in to the Emergency Department and triage nurse.

## 2015-01-16 NOTE — Progress Notes (Signed)
Pt reports sharp pain in right hip, which is new to him. Home medications and heat has helped pain per pt. Dr. [name] aware. No new orders at this time.

## 2015-01-17 ENCOUNTER — Ambulatory Visit (HOSPITAL_BASED_OUTPATIENT_CLINIC_OR_DEPARTMENT_OTHER): Payer: BLUE CROSS/BLUE SHIELD

## 2015-01-17 VITALS — BP 122/74 | HR 67 | Temp 97.0°F | Resp 18

## 2015-01-17 DIAGNOSIS — Z5111 Encounter for antineoplastic chemotherapy: Secondary | ICD-10-CM

## 2015-01-17 DIAGNOSIS — C3491 Malignant neoplasm of unspecified part of right bronchus or lung: Secondary | ICD-10-CM

## 2015-01-17 DIAGNOSIS — C3490 Malignant neoplasm of unspecified part of unspecified bronchus or lung: Secondary | ICD-10-CM

## 2015-01-17 MED ORDER — SODIUM CHLORIDE 0.9 % IV SOLN
80.0000 mg/m2 | Freq: Once | INTRAVENOUS | Status: AC
  Administered 2015-01-17: 120 mg via INTRAVENOUS
  Filled 2015-01-17: qty 6

## 2015-01-17 MED ORDER — PROCHLORPERAZINE MALEATE 10 MG PO TABS
ORAL_TABLET | ORAL | Status: AC
  Filled 2015-01-17: qty 1

## 2015-01-17 MED ORDER — SODIUM CHLORIDE 0.9 % IJ SOLN
10.0000 mL | INTRAMUSCULAR | Status: DC | PRN
  Administered 2015-01-17: 10 mL via INTRAVENOUS
  Filled 2015-01-17: qty 10

## 2015-01-17 MED ORDER — SODIUM CHLORIDE 0.9 % IV SOLN
Freq: Once | INTRAVENOUS | Status: AC
  Administered 2015-01-17: 09:00:00 via INTRAVENOUS

## 2015-01-17 MED ORDER — PROCHLORPERAZINE MALEATE 10 MG PO TABS
10.0000 mg | ORAL_TABLET | Freq: Once | ORAL | Status: AC
  Administered 2015-01-17: 10 mg via ORAL

## 2015-01-17 MED ORDER — HEPARIN SOD (PORK) LOCK FLUSH 100 UNIT/ML IV SOLN
500.0000 [IU] | Freq: Once | INTRAVENOUS | Status: AC
  Administered 2015-01-17: 500 [IU] via INTRAVENOUS
  Filled 2015-01-17: qty 5

## 2015-01-17 NOTE — Patient Instructions (Signed)
[facility] Discharge Instructions for Patients Receiving Chemotherapy  Today you received the following chemotherapy agents [id number] To help prevent nausea and vomiting after your treatment, we encourage you to take your nausea medication as prescribed.   If you develop nausea and vomiting that is not controlled by your nausea medication, call the clinic.   BELOW ARE SYMPTOMS THAT SHOULD BE REPORTED IMMEDIATELY:  *FEVER GREATER THAN 100.5 F  *CHILLS WITH OR WITHOUT FEVER  NAUSEA AND VOMITING THAT IS NOT CONTROLLED WITH YOUR NAUSEA MEDICATION  *UNUSUAL SHORTNESS OF BREATH  *UNUSUAL BRUISING OR BLEEDING  TENDERNESS IN MOUTH AND THROAT WITH OR WITHOUT PRESENCE OF ULCERS  *URINARY PROBLEMS  *BOWEL PROBLEMS  UNUSUAL RASH Items with * indicate a potential emergency and should be followed up as soon as possible.  Feel free to call the clinic you have any questions or concerns. The clinic phone number is [phone].  Please show the [facility] at check-in to the Emergency Department and triage nurse.

## 2015-01-19 ENCOUNTER — Telehealth: Payer: Self-pay | Admitting: *Deleted

## 2015-01-19 ENCOUNTER — Ambulatory Visit: Payer: BLUE CROSS/BLUE SHIELD

## 2015-01-19 NOTE — Telephone Encounter (Signed)
Patient's wife called reporting Altonwas not able to come in today for injection due to his diarrhea.  He's better and can come in tomorrow."  P.O.F. Generated for injection at 0830.

## 2015-01-20 ENCOUNTER — Ambulatory Visit (HOSPITAL_BASED_OUTPATIENT_CLINIC_OR_DEPARTMENT_OTHER): Payer: BLUE CROSS/BLUE SHIELD

## 2015-01-20 VITALS — BP 112/58 | HR 124 | Temp 97.6°F

## 2015-01-20 DIAGNOSIS — Z5189 Encounter for other specified aftercare: Secondary | ICD-10-CM

## 2015-01-20 DIAGNOSIS — C3491 Malignant neoplasm of unspecified part of right bronchus or lung: Secondary | ICD-10-CM | POA: Diagnosis not present

## 2015-01-20 MED ORDER — PEGFILGRASTIM INJECTION 6 MG/0.6ML ~~LOC~~
6.0000 mg | PREFILLED_SYRINGE | Freq: Once | SUBCUTANEOUS | Status: AC
  Administered 2015-01-20: 6 mg via SUBCUTANEOUS

## 2015-01-22 ENCOUNTER — Ambulatory Visit (HOSPITAL_BASED_OUTPATIENT_CLINIC_OR_DEPARTMENT_OTHER): Payer: BLUE CROSS/BLUE SHIELD

## 2015-01-22 ENCOUNTER — Telehealth: Payer: Self-pay | Admitting: *Deleted

## 2015-01-22 ENCOUNTER — Other Ambulatory Visit: Payer: Self-pay | Admitting: Internal Medicine

## 2015-01-22 ENCOUNTER — Other Ambulatory Visit (HOSPITAL_BASED_OUTPATIENT_CLINIC_OR_DEPARTMENT_OTHER): Payer: BLUE CROSS/BLUE SHIELD

## 2015-01-22 DIAGNOSIS — C3490 Malignant neoplasm of unspecified part of unspecified bronchus or lung: Secondary | ICD-10-CM

## 2015-01-22 DIAGNOSIS — Z95828 Presence of other vascular implants and grafts: Secondary | ICD-10-CM

## 2015-01-22 LAB — COMPREHENSIVE METABOLIC PANEL (CC13)
ALBUMIN: 3.1 g/dL — AB (ref 3.5–5.0)
ALK PHOS: 102 U/L (ref 40–150)
ALT: 9 U/L (ref 0–55)
ANION GAP: 7 meq/L (ref 3–11)
AST: 17 U/L (ref 5–34)
BILIRUBIN TOTAL: 0.45 mg/dL (ref 0.20–1.20)
BUN: 11.6 mg/dL (ref 7.0–26.0)
CALCIUM: 8.9 mg/dL (ref 8.4–10.4)
CO2: 29 meq/L (ref 22–29)
CREATININE: 0.8 mg/dL (ref 0.7–1.3)
Chloride: 102 mEq/L (ref 98–109)
EGFR: >90 mL/min/{1.73_m2} (ref >90)
Glucose: 142 mg/dl — ABNORMAL HIGH (ref 70–140)
Potassium: 4 mEq/L (ref 3.5–5.1)
Sodium: 138 mEq/L (ref 136–145)
TOTAL PROTEIN: 6.4 g/dL (ref 6.4–8.3)

## 2015-01-22 LAB — CBC WITH DIFFERENTIAL/PLATELET
BASO%: 0.2 % (ref 0.0–2.0)
Basophils Absolute: 0.1 10*3/uL (ref 0.0–0.1)
EOS%: 0.1 % (ref 0.0–7.0)
Eosinophils Absolute: 0 10*3/uL (ref 0.0–0.5)
HCT: 25 % — ABNORMAL LOW (ref 38.4–49.9)
HEMOGLOBIN: 8 g/dL — AB (ref 13.0–17.1)
LYMPH#: 0.6 10*3/uL — AB (ref 0.9–3.3)
LYMPH%: 2.5 % — ABNORMAL LOW (ref 14.0–49.0)
MCH: 33.3 pg (ref 27.2–33.4)
MCHC: 32.2 g/dL (ref 32.0–36.0)
MCV: 103.6 fL — ABNORMAL HIGH (ref 79.3–98.0)
MONO#: 0.2 10*3/uL (ref 0.1–0.9)
MONO%: 1 % (ref 0.0–14.0)
NEUT%: 96.2 % — ABNORMAL HIGH (ref 39.0–75.0)
NEUTROS ABS: 24.2 10*3/uL — AB (ref 1.5–6.5)
Platelets: 63 10*3/uL — ABNORMAL LOW (ref 140–400)
RBC: 2.41 10*6/uL — ABNORMAL LOW (ref 4.20–5.82)
RDW: 18.5 % — AB (ref 11.0–14.6)
WBC: 25.1 10*3/uL — AB (ref 4.0–10.3)

## 2015-01-22 MED ORDER — SODIUM CHLORIDE 0.9 % IJ SOLN
10.0000 mL | INTRAMUSCULAR | Status: DC | PRN
  Administered 2015-01-22: 10 mL via INTRAVENOUS
  Filled 2015-01-22: qty 10

## 2015-01-22 MED ORDER — HEPARIN SOD (PORK) LOCK FLUSH 100 UNIT/ML IV SOLN
500.0000 [IU] | Freq: Once | INTRAVENOUS | Status: AC
  Administered 2015-01-22: 500 [IU] via INTRAVENOUS
  Filled 2015-01-22: qty 5

## 2015-01-22 NOTE — Patient Instructions (Signed)

## 2015-01-22 NOTE — Telephone Encounter (Signed)
Patient's wife called "requesting Levaquin refill.  He was given this a few weeks ago and Dr. said he'd refill if needed and this week he thinks he needs levaquin.  He has congestion in his chest and runny nose."   Denies fever.  Lab today at 2:15.    Voicemail with this information left for collaborative.

## 2015-01-22 NOTE — Telephone Encounter (Signed)
Cyndee can evaluate the patient if his cough is getting worse or if he develops fever

## 2015-01-22 NOTE — Telephone Encounter (Signed)
Called pt unable to reach pt on home #. No VM  Called mobile # spoke to wife and discussed if pt has fever or cough worsens to call and we will have him see [facility]. Wife verbalized understanding and thanked me for the call.

## 2015-01-29 ENCOUNTER — Ambulatory Visit (HOSPITAL_COMMUNITY)
Admission: RE | Admit: 2015-01-29 | Discharge: 2015-01-29 | Disposition: A | Discharge status: Home / Self Care | Payer: BLUE CROSS/BLUE SHIELD | Source: Ambulatory Visit | Attending: Internal Medicine | Admitting: Internal Medicine

## 2015-01-29 ENCOUNTER — Other Ambulatory Visit: Payer: Self-pay | Admitting: Medical Oncology

## 2015-01-29 ENCOUNTER — Ambulatory Visit (HOSPITAL_BASED_OUTPATIENT_CLINIC_OR_DEPARTMENT_OTHER): Payer: BLUE CROSS/BLUE SHIELD

## 2015-01-29 ENCOUNTER — Ambulatory Visit: Payer: BLUE CROSS/BLUE SHIELD

## 2015-01-29 ENCOUNTER — Other Ambulatory Visit (HOSPITAL_BASED_OUTPATIENT_CLINIC_OR_DEPARTMENT_OTHER): Payer: BLUE CROSS/BLUE SHIELD

## 2015-01-29 ENCOUNTER — Telehealth: Payer: Self-pay | Admitting: Medical Oncology

## 2015-01-29 VITALS — BP 102/76 | HR 102 | Temp 98.0°F | Resp 18

## 2015-01-29 VITALS — BP 103/66 | HR 76 | Temp 98.1°F | Resp 14

## 2015-01-29 DIAGNOSIS — D696 Thrombocytopenia, unspecified: Secondary | ICD-10-CM

## 2015-01-29 DIAGNOSIS — D6481 Anemia due to antineoplastic chemotherapy: Secondary | ICD-10-CM | POA: Diagnosis present

## 2015-01-29 DIAGNOSIS — C3490 Malignant neoplasm of unspecified part of unspecified bronchus or lung: Secondary | ICD-10-CM

## 2015-01-29 DIAGNOSIS — T451X5A Adverse effect of antineoplastic and immunosuppressive drugs, initial encounter: Secondary | ICD-10-CM

## 2015-01-29 DIAGNOSIS — M79601 Pain in right arm: Secondary | ICD-10-CM

## 2015-01-29 DIAGNOSIS — C3491 Malignant neoplasm of unspecified part of right bronchus or lung: Secondary | ICD-10-CM

## 2015-01-29 LAB — COMPREHENSIVE METABOLIC PANEL
ALBUMIN: 3.3 g/dL — AB (ref 3.5–5.0)
ALK PHOS: 109 U/L (ref 40–150)
ALT: <9 U/L (ref 0–55)
ANION GAP: 7 meq/L (ref 3–11)
AST: 14 U/L (ref 5–34)
BUN: 15.7 mg/dL (ref 7.0–26.0)
CALCIUM: 9.2 mg/dL (ref 8.4–10.4)
CHLORIDE: 103 meq/L (ref 98–109)
CO2: 29 mEq/L (ref 22–29)
CREATININE: 0.9 mg/dL (ref 0.7–1.3)
EGFR: >90 mL/min/{1.73_m2} (ref >90)
Glucose: 150 mg/dl — ABNORMAL HIGH (ref 70–140)
POTASSIUM: 4.1 meq/L (ref 3.5–5.1)
SODIUM: 138 meq/L (ref 136–145)
Total Bilirubin: <0.3 mg/dL (ref 0.20–1.20)
Total Protein: 6.5 g/dL (ref 6.4–8.3)

## 2015-01-29 LAB — CBC WITH DIFFERENTIAL/PLATELET
BASO%: 0.4 % (ref 0.0–2.0)
Basophils Absolute: 0 10*3/uL (ref 0.0–0.1)
EOS ABS: 0 10*3/uL (ref 0.0–0.5)
EOS%: 0.3 % (ref 0.0–7.0)
HCT: 20.6 % — ABNORMAL LOW (ref 38.4–49.9)
HGB: 6.9 g/dL — CL (ref 13.0–17.1)
LYMPH%: 9.8 % — AB (ref 14.0–49.0)
MCH: 33.8 pg — AB (ref 27.2–33.4)
MCHC: 33.4 g/dL (ref 32.0–36.0)
MCV: 101.5 fL — AB (ref 79.3–98.0)
MONO#: 0.3 10*3/uL (ref 0.1–0.9)
MONO%: 7.5 % (ref 0.0–14.0)
NEUT%: 82 % — ABNORMAL HIGH (ref 39.0–75.0)
NEUTROS ABS: 3.1 10*3/uL (ref 1.5–6.5)
Platelets: 5 10*3/uL — CL (ref 140–400)
RBC: 2.03 10*6/uL — AB (ref 4.20–5.82)
RDW: 16.9 % — ABNORMAL HIGH (ref 11.0–14.6)
WBC: 3.8 10*3/uL — AB (ref 4.0–10.3)
lymph#: 0.4 10*3/uL — ABNORMAL LOW (ref 0.9–3.3)

## 2015-01-29 MED ORDER — DIPHENHYDRAMINE HCL 25 MG PO CAPS
ORAL_CAPSULE | ORAL | Status: AC
  Filled 2015-01-29: qty 1

## 2015-01-29 MED ORDER — SODIUM CHLORIDE 0.9 % IV SOLN: 250.0000 mL | Freq: Once | INTRAVENOUS | Status: DC

## 2015-01-29 MED ORDER — ACETAMINOPHEN 325 MG PO TABS
650.0000 mg | ORAL_TABLET | Freq: Once | ORAL | Status: AC
  Administered 2015-01-29: 650 mg via ORAL

## 2015-01-29 MED ORDER — SODIUM CHLORIDE 0.9 % IV SOLN
250.0000 mL | Freq: Once | INTRAVENOUS | Status: AC
  Administered 2015-01-29: 250 mL via INTRAVENOUS

## 2015-01-29 MED ORDER — SODIUM CHLORIDE 0.9 % IJ SOLN
10.0000 mL | INTRAMUSCULAR | Status: DC | PRN
  Filled 2015-01-29: qty 10

## 2015-01-29 MED ORDER — DIPHENHYDRAMINE HCL 25 MG PO CAPS
25.0000 mg | ORAL_CAPSULE | Freq: Once | ORAL | Status: AC
  Administered 2015-01-29: 25 mg via ORAL

## 2015-01-29 MED ORDER — SODIUM CHLORIDE 0.9 % IJ SOLN
10.0000 mL | Freq: Once | INTRAMUSCULAR | Status: AC
  Administered 2015-01-29: 10 mL
  Filled 2015-01-29: qty 10

## 2015-01-29 MED ORDER — ACETAMINOPHEN 325 MG PO TABS
ORAL_TABLET | ORAL | Status: AC
  Filled 2015-01-29: qty 2

## 2015-01-29 MED ORDER — HEPARIN SOD (PORK) LOCK FLUSH 100 UNIT/ML IV SOLN
500.0000 [IU] | Freq: Every day | INTRAVENOUS | Status: DC | PRN
  Filled 2015-01-29: qty 5

## 2015-01-29 MED ORDER — HEPARIN SOD (PORK) LOCK FLUSH 100 UNIT/ML IV SOLN
500.0000 [IU] | Freq: Once | INTRAVENOUS | Status: DC
  Administered 2015-01-29: 500 [IU]
  Filled 2015-01-29: qty 5

## 2015-01-29 NOTE — Progress Notes (Signed)
Har done 

## 2015-01-29 NOTE — Telephone Encounter (Signed)
Onc treatment request sent for platelets and blood transfusion. Per Ricky Villanueva needs platelets first today or tomorrow and blood tomorrow.Wife notified to see scheduler now for appts. Bleeding precautions reinforced.

## 2015-01-29 NOTE — Patient Instructions (Signed)
Platelet Transfusion  A platelet transfusion is a procedure in which you receive donated platelets through an IV tube. Platelets are tiny pieces of blood cells. When a blood vessel is damaged, platelets collect in the damaged area to help form a blood clot. This begins the healing process. If your platelet count gets too low, your blood may have trouble clotting.  You may need a platelet transfusion if you have a condition that causes a low number of platelets (thrombocytopenia). A platelet transfusion may be used to stop or prevent bleeding.  LET [facility] CARE PROVIDER KNOW ABOUT:   Any allergies you have.   All medicines you are taking, including vitamins, herbs, eye drops, creams, and over-the-counter medicines.   Previous problems you or members of your family have had with the use of anesthetics.   Any blood disorders you have.   Previous surgeries you have had.   Any medical conditions you may have.   Any reactions you have had during a previous transfusion. RISKS AND COMPLICATIONS Generally, this is a safe procedure. However, problems may occur, including:   Fever with or without chills. The fever usually occurs within the first 4 hours of the transfusion and returns to normal within 48 hours.  Allergic reaction. The reaction is most commonly caused by antibodies your body creates against substances in the transfusion. Signs of an allergic reaction may include itching, hives, difficulty breathing, shock, or low blood pressure.  Sudden (acute) or delayed hemolytic reaction. This rare reaction can occur during the transfusion and up to 28 days after the transfusion. The reaction usually occurs when your body's defense system (immune system) attacks the new platelets. Signs of a hemolytic reaction may include fever, headache, difficulty breathing, low blood pressure, a rapid heartbeat, or pain in your back, abdomen, chest, or IV site.  Transfusion-related acute lung injury  (TRALI). TRALI can occur within hours of a transfusion, or several days later. This is a rare reaction that causes lung damage. The cause is not known.  Infection. Signs of this rare complication may include fever, chills, vomiting, a rapid heartbeat, or low blood pressure. BEFORE THE PROCEDURE   You may have a blood test to determine your blood type. This is necessary to find out what kind ofplatelets best matches your platelets.  If you have had an allergic reaction to a transfusion in the past, you may be given medicine to help prevent a reaction. Take this medicine only as directed by your health care provider.  Your temperature, blood pressure, and pulse will be monitored before the transfusion. PROCEDURE  An IV will be started in your hand or arm.  The transfusion will be attached to your IV tubing. The bag of donated platelets will be attached to your IV tube andgiven into your vein.  Your temperature, blood pressure, and pulse will be monitored regularly during the transfusion. This monitoring is done to help detect early signs of a transfusion reaction.  If you have any signs or symptoms of a reaction, your transfusion will be stopped and you may be given medicine.  When your transfusion is complete, your IV will be removed.  Pressure may be applied to the IV site for a few minutes.  A bandage (dressing) will be applied. The procedure may vary among health care providers and hospitals. AFTER THE PROCEDURE  Your blood pressure, temperature, and pulse will be monitored regularly.   This information is not intended to replace advice given to you by your health  care provider. Make sure you discuss any questions you have with your health care provider.   Document Released: 12/08/2006 Document Revised: 03/03/2014 Document Reviewed: 12/21/2013 Elsevier Interactive Patient Education [employer].

## 2015-01-30 ENCOUNTER — Ambulatory Visit (HOSPITAL_BASED_OUTPATIENT_CLINIC_OR_DEPARTMENT_OTHER): Payer: BLUE CROSS/BLUE SHIELD

## 2015-01-30 VITALS — BP 124/72 | HR 67 | Temp 96.9°F | Resp 18

## 2015-01-30 DIAGNOSIS — D6481 Anemia due to antineoplastic chemotherapy: Secondary | ICD-10-CM | POA: Diagnosis not present

## 2015-01-30 DIAGNOSIS — D696 Thrombocytopenia, unspecified: Secondary | ICD-10-CM | POA: Diagnosis not present

## 2015-01-30 DIAGNOSIS — C3490 Malignant neoplasm of unspecified part of unspecified bronchus or lung: Secondary | ICD-10-CM

## 2015-01-30 DIAGNOSIS — T451X5A Adverse effect of antineoplastic and immunosuppressive drugs, initial encounter: Principal | ICD-10-CM

## 2015-01-30 LAB — PREPARE PLATELET PHERESIS: UNIT DIVISION: 0

## 2015-01-30 LAB — PREPARE RBC (CROSSMATCH)

## 2015-01-30 MED ORDER — SODIUM CHLORIDE 0.9 % IV SOLN
250.0000 mL | Freq: Once | INTRAVENOUS | Status: AC
  Administered 2015-01-30: 250 mL via INTRAVENOUS

## 2015-01-30 MED ORDER — HEPARIN SOD (PORK) LOCK FLUSH 100 UNIT/ML IV SOLN
500.0000 [IU] | Freq: Once | INTRAVENOUS | Status: AC
  Administered 2015-01-30: 500 [IU] via INTRAVENOUS
  Filled 2015-01-30: qty 5

## 2015-01-30 MED ORDER — SODIUM CHLORIDE 0.9 % IJ SOLN
10.0000 mL | INTRAMUSCULAR | Status: DC | PRN
  Administered 2015-01-30: 10 mL via INTRAVENOUS
  Filled 2015-01-30: qty 10

## 2015-01-30 MED ORDER — HEPARIN SOD (PORK) LOCK FLUSH 100 UNIT/ML IV SOLN
250.0000 [IU] | INTRAVENOUS | Status: DC | PRN
  Filled 2015-01-30: qty 5

## 2015-01-30 NOTE — Patient Instructions (Signed)
Blood Transfusion, Care After  These instructions give you information about caring for yourself after your procedure. Your doctor may also give you more specific instructions. Call your doctor if you have any problems or questions after your procedure.   HOME CARE   Take medicines only as told by your doctor. Ask your doctor if you can take an over-the-counter pain reliever if you have a fever or headache a day or two after your procedure.   Return to your normal activities as told by your doctor.  GET HELP IF:    You develop redness or irritation at your IV site.   You have a fever, chills, or a headache that does not go away.   Your pee (urine) is darker than normal.   Your urine turns:    Pink.    Red.    Brown.   The white part of your eye turns yellow (jaundice).   You feel weak after doing your normal activities.  GET HELP RIGHT AWAY IF:    You have trouble breathing.   You have fever and chills and you also have:    Anxiety.    Chest or back pain.    Flushed or pink skin.    Clammy or sweaty skin.    A fast heartbeat.    A sick feeling in your stomach (nausea).     This information is not intended to replace advice given to you by your health care provider. Make sure you discuss any questions you have with your health care provider.     Document Released: 03/03/2014 Document Reviewed: 03/03/2014  Elsevier Interactive Patient Education 2016 [employer]

## 2015-01-31 LAB — TYPE AND SCREEN
ABO/RH(D): A POS
Antibody Screen: NEGATIVE
Unit division: 0
Unit division: 0

## 2015-02-05 ENCOUNTER — Ambulatory Visit: Payer: BLUE CROSS/BLUE SHIELD

## 2015-02-05 ENCOUNTER — Other Ambulatory Visit (HOSPITAL_BASED_OUTPATIENT_CLINIC_OR_DEPARTMENT_OTHER): Payer: BLUE CROSS/BLUE SHIELD

## 2015-02-05 DIAGNOSIS — M79601 Pain in right arm: Secondary | ICD-10-CM

## 2015-02-05 DIAGNOSIS — C3491 Malignant neoplasm of unspecified part of right bronchus or lung: Secondary | ICD-10-CM | POA: Diagnosis not present

## 2015-02-05 DIAGNOSIS — Z95828 Presence of other vascular implants and grafts: Secondary | ICD-10-CM

## 2015-02-05 LAB — COMPREHENSIVE METABOLIC PANEL
ALBUMIN: 3.2 g/dL — AB (ref 3.5–5.0)
ALT: 13 U/L (ref 0–55)
AST: 16 U/L (ref 5–34)
Alkaline Phosphatase: 112 U/L (ref 40–150)
Anion Gap: 8 mEq/L (ref 3–11)
BUN: 8.7 mg/dL (ref 7.0–26.0)
CO2: 28 meq/L (ref 22–29)
CREATININE: 0.8 mg/dL (ref 0.7–1.3)
Calcium: 9.1 mg/dL (ref 8.4–10.4)
Chloride: 103 mEq/L (ref 98–109)
EGFR: >90 mL/min/{1.73_m2} (ref >90)
GLUCOSE: 138 mg/dL (ref 70–140)
Potassium: 4.1 mEq/L (ref 3.5–5.1)
SODIUM: 139 meq/L (ref 136–145)
TOTAL PROTEIN: 6.8 g/dL (ref 6.4–8.3)

## 2015-02-05 LAB — CBC WITH DIFFERENTIAL/PLATELET
BASO%: 0.7 % (ref 0.0–2.0)
Basophils Absolute: 0 10*3/uL (ref 0.0–0.1)
EOS%: 0.4 % (ref 0.0–7.0)
Eosinophils Absolute: 0 10*3/uL (ref 0.0–0.5)
HCT: 31.6 % — ABNORMAL LOW (ref 38.4–49.9)
HEMOGLOBIN: 10.3 g/dL — AB (ref 13.0–17.1)
LYMPH%: 10.2 % — AB (ref 14.0–49.0)
MCH: 31.5 pg (ref 27.2–33.4)
MCHC: 32.7 g/dL (ref 32.0–36.0)
MCV: 96.3 fL (ref 79.3–98.0)
MONO#: 0.6 10*3/uL (ref 0.1–0.9)
MONO%: 10 % (ref 0.0–14.0)
NEUT%: 78.7 % — ABNORMAL HIGH (ref 39.0–75.0)
NEUTROS ABS: 4.8 10*3/uL (ref 1.5–6.5)
Platelets: 41 10*3/uL — ABNORMAL LOW (ref 140–400)
RBC: 3.28 10*6/uL — AB (ref 4.20–5.82)
RDW: 16.9 % — AB (ref 11.0–14.6)
WBC: 6.1 10*3/uL (ref 4.0–10.3)
lymph#: 0.6 10*3/uL — ABNORMAL LOW (ref 0.9–3.3)

## 2015-02-05 MED ORDER — SODIUM CHLORIDE 0.9 % IJ SOLN
10.0000 mL | INTRAMUSCULAR | Status: DC | PRN
  Administered 2015-02-05: 10 mL via INTRAVENOUS
  Filled 2015-02-05: qty 10

## 2015-02-05 MED ORDER — HEPARIN SOD (PORK) LOCK FLUSH 100 UNIT/ML IV SOLN
500.0000 [IU] | Freq: Once | INTRAVENOUS | Status: AC
  Administered 2015-02-05: 500 [IU] via INTRAVENOUS
  Filled 2015-02-05: qty 5

## 2015-02-06 ENCOUNTER — Other Ambulatory Visit: Payer: Self-pay | Admitting: Medical Oncology

## 2015-02-06 DIAGNOSIS — C3490 Malignant neoplasm of unspecified part of unspecified bronchus or lung: Secondary | ICD-10-CM

## 2015-02-06 DIAGNOSIS — M545 Low back pain, unspecified: Secondary | ICD-10-CM

## 2015-02-06 DIAGNOSIS — R918 Other nonspecific abnormal finding of lung field: Secondary | ICD-10-CM

## 2015-02-06 MED ORDER — OXYCODONE HCL 5 MG PO TABS: 5.0000 mg | ORAL_TABLET | Freq: Four times a day (QID) | ORAL | Status: DC | PRN

## 2015-02-06 MED ORDER — MORPHINE SULFATE ER 30 MG PO TBCR: 30.0000 mg | EXTENDED_RELEASE_TABLET | Freq: Two times a day (BID) | ORAL | Status: DC

## 2015-02-06 MED ORDER — LORAZEPAM 0.5 MG PO TABS: 0.5000 mg | ORAL_TABLET | Freq: Two times a day (BID) | ORAL | Status: DC | PRN

## 2015-02-06 NOTE — Progress Notes (Signed)
Pain med x3 refills locked up. i called in ativan refill also . Wife notified.

## 2015-02-07 ENCOUNTER — Other Ambulatory Visit: Payer: Self-pay | Admitting: Physician Assistant

## 2015-02-07 ENCOUNTER — Ambulatory Visit (HOSPITAL_COMMUNITY)
Admission: RE | Admit: 2015-02-07 | Discharge: 2015-02-07 | Disposition: A | Discharge status: Home / Self Care | Payer: BLUE CROSS/BLUE SHIELD | Source: Ambulatory Visit | Attending: Physician Assistant | Admitting: Physician Assistant

## 2015-02-07 DIAGNOSIS — J069 Acute upper respiratory infection, unspecified: Secondary | ICD-10-CM

## 2015-02-07 DIAGNOSIS — I7090 Unspecified atherosclerosis: Secondary | ICD-10-CM | POA: Diagnosis not present

## 2015-02-07 DIAGNOSIS — I2510 Atherosclerotic heart disease of native coronary artery without angina pectoris: Secondary | ICD-10-CM | POA: Insufficient documentation

## 2015-02-07 DIAGNOSIS — C3490 Malignant neoplasm of unspecified part of unspecified bronchus or lung: Secondary | ICD-10-CM | POA: Diagnosis not present

## 2015-02-07 DIAGNOSIS — M899 Disorder of bone, unspecified: Secondary | ICD-10-CM | POA: Diagnosis not present

## 2015-02-07 DIAGNOSIS — Z923 Personal history of irradiation: Secondary | ICD-10-CM | POA: Insufficient documentation

## 2015-02-07 DIAGNOSIS — R918 Other nonspecific abnormal finding of lung field: Secondary | ICD-10-CM

## 2015-02-07 MED ORDER — IOHEXOL 300 MG/ML  SOLN
100.0000 mL | Freq: Once | INTRAMUSCULAR | Status: AC | PRN
Start: 2015-02-07 — End: 2015-02-07
  Administered 2015-02-07: 80 mL via INTRAVENOUS

## 2015-02-12 ENCOUNTER — Encounter: Payer: Self-pay | Admitting: Internal Medicine

## 2015-02-12 ENCOUNTER — Ambulatory Visit (HOSPITAL_BASED_OUTPATIENT_CLINIC_OR_DEPARTMENT_OTHER): Payer: BLUE CROSS/BLUE SHIELD | Admitting: Internal Medicine

## 2015-02-12 ENCOUNTER — Other Ambulatory Visit (HOSPITAL_BASED_OUTPATIENT_CLINIC_OR_DEPARTMENT_OTHER): Payer: BLUE CROSS/BLUE SHIELD

## 2015-02-12 ENCOUNTER — Ambulatory Visit: Payer: BLUE CROSS/BLUE SHIELD

## 2015-02-12 VITALS — BP 115/85 | HR 87 | Temp 97.7°F | Resp 17 | Ht 67.0 in | Wt 109.1 lb

## 2015-02-12 DIAGNOSIS — C7989 Secondary malignant neoplasm of other specified sites: Secondary | ICD-10-CM | POA: Diagnosis not present

## 2015-02-12 DIAGNOSIS — C3490 Malignant neoplasm of unspecified part of unspecified bronchus or lung: Secondary | ICD-10-CM

## 2015-02-12 DIAGNOSIS — C3491 Malignant neoplasm of unspecified part of right bronchus or lung: Secondary | ICD-10-CM

## 2015-02-12 DIAGNOSIS — E43 Unspecified severe protein-calorie malnutrition: Secondary | ICD-10-CM

## 2015-02-12 DIAGNOSIS — C7931 Secondary malignant neoplasm of brain: Secondary | ICD-10-CM

## 2015-02-12 DIAGNOSIS — R0602 Shortness of breath: Secondary | ICD-10-CM | POA: Diagnosis not present

## 2015-02-12 DIAGNOSIS — Z95828 Presence of other vascular implants and grafts: Secondary | ICD-10-CM

## 2015-02-12 DIAGNOSIS — Z7901 Long term (current) use of anticoagulants: Secondary | ICD-10-CM

## 2015-02-12 DIAGNOSIS — R630 Anorexia: Secondary | ICD-10-CM

## 2015-02-12 DIAGNOSIS — Z86718 Personal history of other venous thrombosis and embolism: Secondary | ICD-10-CM

## 2015-02-12 DIAGNOSIS — M79601 Pain in right arm: Secondary | ICD-10-CM

## 2015-02-12 DIAGNOSIS — C3492 Malignant neoplasm of unspecified part of left bronchus or lung: Secondary | ICD-10-CM

## 2015-02-12 LAB — COMPREHENSIVE METABOLIC PANEL
ALT: 15 U/L (ref 0–55)
AST: 20 U/L (ref 5–34)
Albumin: 3.2 g/dL — ABNORMAL LOW (ref 3.5–5.0)
Alkaline Phosphatase: 98 U/L (ref 40–150)
Anion Gap: 8 mEq/L (ref 3–11)
BUN: 7 mg/dL (ref 7.0–26.0)
CALCIUM: 9.3 mg/dL (ref 8.4–10.4)
CHLORIDE: 102 meq/L (ref 98–109)
CO2: 28 meq/L (ref 22–29)
Creatinine: 0.9 mg/dL (ref 0.7–1.3)
EGFR: >90 mL/min/{1.73_m2} (ref >90)
GLUCOSE: 127 mg/dL (ref 70–140)
POTASSIUM: 3.8 meq/L (ref 3.5–5.1)
SODIUM: 139 meq/L (ref 136–145)
Total Bilirubin: <0.3 mg/dL (ref 0.20–1.20)
Total Protein: 6.8 g/dL (ref 6.4–8.3)

## 2015-02-12 LAB — CBC WITH DIFFERENTIAL/PLATELET
BASO%: 0.6 % (ref 0.0–2.0)
Basophils Absolute: 0 10*3/uL (ref 0.0–0.1)
EOS%: 0.6 % (ref 0.0–7.0)
Eosinophils Absolute: 0 10*3/uL (ref 0.0–0.5)
HCT: 34.9 % — ABNORMAL LOW (ref 38.4–49.9)
HEMOGLOBIN: 11.5 g/dL — AB (ref 13.0–17.1)
LYMPH#: 0.6 10*3/uL — AB (ref 0.9–3.3)
LYMPH%: 14.9 % (ref 14.0–49.0)
MCH: 32.4 pg (ref 27.2–33.4)
MCHC: 32.9 g/dL (ref 32.0–36.0)
MCV: 98.5 fL — ABNORMAL HIGH (ref 79.3–98.0)
MONO#: 0.8 10*3/uL (ref 0.1–0.9)
MONO%: 19.2 % — ABNORMAL HIGH (ref 0.0–14.0)
NEUT%: 64.7 % (ref 39.0–75.0)
NEUTROS ABS: 2.5 10*3/uL (ref 1.5–6.5)
Platelets: 237 10*3/uL (ref 140–400)
RBC: 3.54 10*6/uL — AB (ref 4.20–5.82)
RDW: 20 % — AB (ref 11.0–14.6)
WBC: 3.9 10*3/uL — ABNORMAL LOW (ref 4.0–10.3)

## 2015-02-12 MED ORDER — SODIUM CHLORIDE 0.9 % IJ SOLN
10.0000 mL | INTRAMUSCULAR | Status: DC | PRN
  Administered 2015-02-12: 10 mL via INTRAVENOUS
  Filled 2015-02-12: qty 10

## 2015-02-12 MED ORDER — HEPARIN SOD (PORK) LOCK FLUSH 100 UNIT/ML IV SOLN
500.0000 [IU] | Freq: Once | INTRAVENOUS | Status: AC
  Administered 2015-02-12: 500 [IU] via INTRAVENOUS
  Filled 2015-02-12: qty 5

## 2015-02-12 NOTE — Progress Notes (Signed)
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  PRIOR THERAPY: 1. First-line chemotherapy with 5 cycles of carboplatin (cisplatin for the first cycle) and etoposide from 05/18/2013 to 10/15/2013.  2. WB XRT and palliative chest radiation given on 05/03/13 - 05/18/13 per Dr. Pablo Ledger. 3. Systemic chemotherapy with carboplatin for AUC of 5 on day 1 and etoposide 100 MG/M2 on days 1, 2 and 3 with Neulasta support on day 4. Status post 6 cycles. Last dose was given 01/08/2015.  CURRENT THERAPY: Observation.  INTERVAL HISTORY: Ricky Villanueva 64 y.o. male returns to the clinic today for follow-up visit accompanied by his wife, daughter and son. The patient completed 6 cycles of systemic chemotherapy with carboplatin and etoposide and tolerated his treatment well except for pancytopenia requiring platelets and PRBCs transfusion. He is feeling much better today. He denied having any significant chest pain but has mild shortness of breath. He denied having any vomiting. He has no fever or chills. He had repeat CT scan of the chest, abdomen and pelvis performed recently and he is here for evaluation and discussion of his scan results.  MEDICAL HISTORY: Past Medical History  Diagnosis Date  . Hyperlipidemia   . Hypertension   . GERD (gastroesophageal reflux disease)   . Respiratory failure with hypoxia ([place]) 04/21/2013    secondary to pneumonia/notes 04/21/2013  . Pulmonary embolism ()     "got one in there now" (04/21/2013)  . COPD (chronic obstructive pulmonary disease) ([place])   . Pneumonia 2/?01/2014    "wouldn't get better; hospitalized 04/21/2013)  . Arthritis     "minor in my right hand" (04/21/2013)  . History of radiation therapy 05/03/13-05/18/13    chest & whole  brain  . Cancer ([place])     lung ca dx'd 03/2013  . Colon polyps     hyperplastic  . Right arm pain 08/21/2014    ALLERGIES:  is allergic to decadron and augmentin.  MEDICATIONS:  Current Outpatient Prescriptions  Medication Sig Dispense Refill  . docusate sodium (COLACE) 100 MG capsule Take 300 mg by mouth 2 (two) times daily as needed for mild constipation.     . Iron-Vitamins (GERITOL PO) Take 1 tablet by mouth daily.    [name] lidocaine-prilocaine (EMLA) cream Apply to port site one hour before treatment and cover with plastic wrap. 1 each 2  . LORazepam (ATIVAN) 0.5 MG tablet Take 1 tablet (0.5 mg total) by mouth 2 (two) times daily as needed. 30 tablet 0  . magnesium hydroxide (MILK OF MAGNESIA) 400 MG/5ML suspension Take 5 mLs by mouth daily as needed for mild constipation.    . mirtazapine (REMERON) 30 MG tablet Take 1 tablet (30 mg total) by mouth at bedtime. 90 tablet 1  . morphine (MS CONTIN) 30 MG 12 hr tablet Take 1 tablet (30 mg total) by mouth every 12 (twelve) hours. 60 tablet 0  . omeprazole (PRILOSEC) 40 MG capsule TAKE 1 CAPSULE (40 MG TOTAL)  BY MOUTH DAILY. 30 capsule 0  . oxyCODONE (OXY IR/ROXICODONE) 5 MG immediate release tablet Take 1 tablet (5 mg total) by mouth every 6 (six) hours as needed for severe pain or breakthrough pain. May take up to ii po q 6rhs prn for severe pain 120 tablet 0  .  polyethylene glycol powder (MIRALAX) powder Take 17 g by mouth daily. 255 g 0  . PROAIR HFA 108 (90 BASE) MCG/ACT inhaler USE 2 INHALATIONS EVERY 6 HOURS AS NEEDED FOR WHEEZING OR SHORTNESS OF BREATH 8.5 g 1  . VENTOLIN HFA 108 (90 BASE) MCG/ACT inhaler Inhale 2 puffs into the lungs every 6 (six) hours as needed.     . vitamin B-12 (CYANOCOBALAMIN) 100 MCG tablet Take 100 mcg by mouth daily.    Alveda Reasons 20 MG TABS tablet TAKE 1 TABLET (20 MG TOTAL) BY MOUTH DAILY WITH SUPPER. 30 tablet 0  . chlorpheniramine-HYDROcodone (TUSSIONEX PENNKINETIC ER) 10-8 MG/5ML SUER Take 5 mLs by mouth  every 12 (twelve) hours as needed for cough. (Patient not taking: Reported on 02/12/2015) 140 mL 0  . dextromethorphan 15 MG/5ML syrup Take 10 mLs by mouth as needed for cough. Reported on 02/12/2015    . ondansetron (ZOFRAN) 8 MG tablet Take 8 mg by mouth every 8 (eight) hours as needed. Reported on 02/12/2015    . prochlorperazine (COMPAZINE) 10 MG tablet Take 1 tablet (10 mg total) by mouth every 6 (six) hours as needed for nausea or vomiting (nausea). (Patient not taking: Reported on 02/12/2015) 30 tablet 3  . SPIRIVA HANDIHALER 18 MCG inhalation capsule Reported on 02/12/2015     No current facility-administered medications for this visit.   Facility-Administered Medications Ordered in Other Visits  Medication Dose Route Frequency Provider Last Rate Last Dose  . 0.9 %  sodium chloride infusion  1,000 mL Intravenous Once Concha Norway, MD      . 0.9 %  sodium chloride infusion   Intravenous Once Concha Norway, MD        SURGICAL HISTORY:  Past Surgical History  Procedure Laterality Date  . Inguinal hernia repair Right ~ 2005  . Finger surgery Left ~ 1989    "crushed end of my middle finger off"   . Video bronchoscopy Bilateral 04/26/2013    Procedure: VIDEO BRONCHOSCOPY WITH FLUORO;  Surgeon: Wilhelmina Mcardle, MD;  Location: [facility] ENDOSCOPY;  Service: Cardiopulmonary;  Laterality: Bilateral;    REVIEW OF SYSTEMS:  Constitutional: positive for fatigue Eyes: negative Ears, nose, mouth, throat, and face: negative Respiratory: negative Cardiovascular: negative Gastrointestinal: negative Genitourinary:negative Integument/breast: negative Hematologic/lymphatic: negative Musculoskeletal:positive for muscle weakness Neurological: positive for dizziness Behavioral/Psych: negative Endocrine: negative Allergic/Immunologic: negative   PHYSICAL EXAMINATION: General appearance: alert, cooperative, fatigued and no distress Head: Normocephalic, without obvious abnormality, atraumatic Neck: no  adenopathy, no JVD, supple, symmetrical, trachea midline and thyroid not enlarged, symmetric, no tenderness/mass/nodules Lymph nodes: Cervical, supraclavicular, and axillary nodes normal. Resp: clear to auscultation bilaterally Back: symmetric, no curvature. ROM normal. No CVA tenderness. Cardio: regular rate and rhythm, S1, S2 normal, no murmur, click, rub or gallop GI: soft, non-tender; bowel sounds normal; no masses,  no organomegaly Extremities: extremities normal, atraumatic, no cyanosis or edema Neurologic: Alert and oriented X 3, normal strength and tone. Normal symmetric reflexes. Normal coordination and gait  ECOG PERFORMANCE STATUS: 1 - Symptomatic but completely ambulatory  Blood pressure 115/85, pulse 87, temperature 97.7 F (36.5 C), temperature source Oral, resp. rate 17, height '5\' 7"'$  (1.702 m), weight 109 lb 1.6 oz (49.487 kg), SpO2 90 %.  LABORATORY DATA: Lab Results  Component Value Date   WBC 3.9* 02/12/2015   HGB 11.5* 02/12/2015   HCT 34.9* 02/12/2015   MCV 98.5* 02/12/2015   PLT 237 02/12/2015      Chemistry      Component Value  Date/Time   NA 139 02/05/2015 1311   NA 139 09/01/2014 1313   K 4.1 02/05/2015 1311   K 4.5 09/01/2014 1313   CL 103 09/01/2014 1313   CO2 28 02/05/2015 1311   CO2 27 09/01/2014 1313   BUN 8.7 02/05/2015 1311   BUN 11 09/01/2014 1313   CREATININE 0.8 02/05/2015 1311   CREATININE 0.92 09/01/2014 1313      Component Value Date/Time   CALCIUM 9.1 02/05/2015 1311   CALCIUM 9.5 09/01/2014 1313   ALKPHOS 112 02/05/2015 1311   ALKPHOS 67 09/01/2014 1313   AST 16 02/05/2015 1311   AST 20 09/01/2014 1313   ALT 13 02/05/2015 1311   ALT 14* 09/01/2014 1313   BILITOT <0.30 02/05/2015 1311   BILITOT 0.9 09/01/2014 1313       RADIOGRAPHIC STUDIES: Ct Chest W Contrast  02/07/2015  CLINICAL DATA:  64 year old male with history right-sided small cell lung cancer diagnosed and February 2015 status post radiation therapy and  chemotherapy (last chemotherapy completed 3 weeks ago), presenting with fatigue and chronic shortness of breath. EXAM: CT CHEST, ABDOMEN, AND PELVIS WITH CONTRAST TECHNIQUE: Multidetector CT imaging of the chest, abdomen and pelvis was performed following the standard protocol during bolus administration of intravenous contrast. CONTRAST:  101m OMNIPAQUE IOHEXOL 300 MG/ML  SOLN COMPARISON:  CT of the chest, abdomen and pelvis 11/03/2014. FINDINGS: CT CHEST FINDINGS Mediastinum/Nodes: Heart size is normal. There is no significant pericardial fluid, thickening or pericardial calcification. There is atherosclerosis of the thoracic aorta, the great vessels of the mediastinum and the coronary arteries, including calcified atherosclerotic plaque in the left main, left anterior descending and right coronary arteries. No pathologically enlarged mediastinal or hilar lymph nodes. Patulous esophagus. No axillary lymphadenopathy. Right internal jugular single-lumen porta cath with tip terminating at the superior cavoatrial junction. Lungs/Pleura: Again noted are extensive chronic consolidative changees in the right lung, most evident in the right lower lobe. Several of these areas appear denser and more mass-like than prior examinations, potentially indicative of areas of developing mass-like fibrosis. The possibility of worsening consolidation from superimposed infection is not excluded the. No new suspicious appearing pulmonary nodules or masses are noted elsewhere in the lungs. No consolidation in the left lung. No pleural effusions. Moderate centrilobular and mild paraseptal emphysema. Musculoskeletal: There are no aggressive appearing lytic or blastic lesions noted in the visualized portions of the skeleton. CT ABDOMEN PELVIS FINDINGS Hepatobiliary: 4 mm low-attenuation lesion in the right lobe of the liver between segments 5 and 8 (image 61 of series 2) is too small to characterize, but similar prior studies, favored to  be a benign lesion such as a tiny cyst. Similar appearing 4 mm lesion in segment 4A is also unchanged. No new suspicious hepatic lesions are noted. No intra or extrahepatic biliary ductal dilatation. Gallbladder is normal in appearance. Pancreas: No pancreatic mass. No pancreatic ductal dilatation. No pancreatic or peripancreatic fluid or inflammatory changes. Spleen: Unremarkable. Adrenals/Urinary Tract: Multiple small low-attenuation lesions are again noted in the kidneys bilaterally, largest of which is exophytic in the anterior aspect of the interpolar region measuring 1.4 cm in diameter, compatible with a cyst. The other low-attenuation lesions are too small to definitively characterize, but are similar to prior studies, presumably small cysts. Bilateral adrenal glands are unremarkable in appearance. No hydroureteronephrosis. Urinary bladder is normal in appearance. Stomach/Bowel: Normal appearance of the stomach. No pathologic dilatation of small bowel or colon. Normal appendix. Vascular/Lymphatic: Extensive atherosclerosis throughout the abdominal and pelvic  vasculature, without evidence of aneurysm or dissection. No lymphadenopathy noted in the abdomen or pelvis. Reproductive: Prostate gland and seminal vesicles are unremarkable in appearance. Other: No significant volume of ascites.  No pneumoperitoneum. Musculoskeletal: Lucent lesion with sclerotic margin and measuring 1 cm in the left ilium just lateral to the left sacroiliac joint is stable compared to prior studies dating back to 06/15/2013, favored to be benign. 9 mm sclerotic lesion in right-sided T10 vertebral body, unchanged compared to the prior examination. No other new osseous lesions are noted elsewhere in the visualized axial or appendicular skeleton. IMPRESSION: 1. Progressive postradiation changes in the right lung, most evident the right lower lobe, likely to reflect evolving postradiation mass-like fibrosis. The possibility of acute  infection in the right lower lobe is not excluded, however, and clinical correlation is recommended. 2. 9 mm sclerotic lesion in the right side of the T10 vertebral body (new on the prior study 11/03/2014) remains concerning for a metastatic lesion. No other new definite signs of metastatic disease in the abdomen or pelvis. 3. Atherosclerosis, including left main and 2 vessel coronary artery disease. Please note that although the presence of coronary artery calcium documents the presence of coronary artery disease, the severity of this disease and any potential stenosis cannot be assessed on this non-gated CT examination. Assessment for potential risk factor modification, dietary therapy or pharmacologic therapy may be warranted, if clinically indicated. 4. Additional incidental findings, as above, similar prior examinations. Electronically Signed   By: Vinnie Langton M.D.   On: 02/07/2015 11:14   Ct Abdomen Pelvis W Contrast  02/07/2015  CLINICAL DATA:  64 year old male with history right-sided small cell lung cancer diagnosed and February 2015 status post radiation therapy and chemotherapy (last chemotherapy completed 3 weeks ago), presenting with fatigue and chronic shortness of breath. EXAM: CT CHEST, ABDOMEN, AND PELVIS WITH CONTRAST TECHNIQUE: Multidetector CT imaging of the chest, abdomen and pelvis was performed following the standard protocol during bolus administration of intravenous contrast. CONTRAST:  45m OMNIPAQUE IOHEXOL 300 MG/ML  SOLN COMPARISON:  CT of the chest, abdomen and pelvis 11/03/2014. FINDINGS: CT CHEST FINDINGS Mediastinum/Nodes: Heart size is normal. There is no significant pericardial fluid, thickening or pericardial calcification. There is atherosclerosis of the thoracic aorta, the great vessels of the mediastinum and the coronary arteries, including calcified atherosclerotic plaque in the left main, left anterior descending and right coronary arteries. No pathologically  enlarged mediastinal or hilar lymph nodes. Patulous esophagus. No axillary lymphadenopathy. Right internal jugular single-lumen porta cath with tip terminating at the superior cavoatrial junction. Lungs/Pleura: Again noted are extensive chronic consolidative changees in the right lung, most evident in the right lower lobe. Several of these areas appear denser and more mass-like than prior examinations, potentially indicative of areas of developing mass-like fibrosis. The possibility of worsening consolidation from superimposed infection is not excluded the. No new suspicious appearing pulmonary nodules or masses are noted elsewhere in the lungs. No consolidation in the left lung. No pleural effusions. Moderate centrilobular and mild paraseptal emphysema. Musculoskeletal: There are no aggressive appearing lytic or blastic lesions noted in the visualized portions of the skeleton. CT ABDOMEN PELVIS FINDINGS Hepatobiliary: 4 mm low-attenuation lesion in the right lobe of the liver between segments 5 and 8 (image 61 of series 2) is too small to characterize, but similar prior studies, favored to be a benign lesion such as a tiny cyst. Similar appearing 4 mm lesion in segment 4A is also unchanged. No new suspicious hepatic lesions are  noted. No intra or extrahepatic biliary ductal dilatation. Gallbladder is normal in appearance. Pancreas: No pancreatic mass. No pancreatic ductal dilatation. No pancreatic or peripancreatic fluid or inflammatory changes. Spleen: Unremarkable. Adrenals/Urinary Tract: Multiple small low-attenuation lesions are again noted in the kidneys bilaterally, largest of which is exophytic in the anterior aspect of the interpolar region measuring 1.4 cm in diameter, compatible with a cyst. The other low-attenuation lesions are too small to definitively characterize, but are similar to prior studies, presumably small cysts. Bilateral adrenal glands are unremarkable in appearance. No  hydroureteronephrosis. Urinary bladder is normal in appearance. Stomach/Bowel: Normal appearance of the stomach. No pathologic dilatation of small bowel or colon. Normal appendix. Vascular/Lymphatic: Extensive atherosclerosis throughout the abdominal and pelvic vasculature, without evidence of aneurysm or dissection. No lymphadenopathy noted in the abdomen or pelvis. Reproductive: Prostate gland and seminal vesicles are unremarkable in appearance. Other: No significant volume of ascites.  No pneumoperitoneum. Musculoskeletal: Lucent lesion with sclerotic margin and measuring 1 cm in the left ilium just lateral to the left sacroiliac joint is stable compared to prior studies dating back to 06/15/2013, favored to be benign. 9 mm sclerotic lesion in right-sided T10 vertebral body, unchanged compared to the prior examination. No other new osseous lesions are noted elsewhere in the visualized axial or appendicular skeleton. IMPRESSION: 1. Progressive postradiation changes in the right lung, most evident the right lower lobe, likely to reflect evolving postradiation mass-like fibrosis. The possibility of acute infection in the right lower lobe is not excluded, however, and clinical correlation is recommended. 2. 9 mm sclerotic lesion in the right side of the T10 vertebral body (new on the prior study 11/03/2014) remains concerning for a metastatic lesion. No other new definite signs of metastatic disease in the abdomen or pelvis. 3. Atherosclerosis, including left main and 2 vessel coronary artery disease. Please note that although the presence of coronary artery calcium documents the presence of coronary artery disease, the severity of this disease and any potential stenosis cannot be assessed on this non-gated CT examination. Assessment for potential risk factor modification, dietary therapy or pharmacologic therapy may be warranted, if clinically indicated. 4. Additional incidental findings, as above, similar prior  examinations. Electronically Signed   By: Vinnie Langton M.D.   On: 02/07/2015 11:14    ASSESSMENT AND PLAN: This is a very pleasant 64 years old white male with extensive stage small cell lung cancer status post palliative radiotherapy to the brain and chest as well as 6 cycles of systemic chemotherapy with carboplatin and etoposide completed in August 2015. The patient has been observation for around 10 months. Repeat CT scan of the chest, abdomen and pelvis showed evidence for disease progression with multiple ill-defined soft tissue masses in the right lower lobe and an enlarging subdiaphragmatic lymph nodes and a new suspicious hepatic metastasis. He is currently on systemic chemotherapy with carboplatin and etoposide, status post 6 cycles and tolerated his treatment fairly well except for the pancytopenia associated with weakness and fatigue.  The recent CT scan of the chest, abdomen and pelvis showed no evidence for disease progression. I discussed the scan results with the patient and his family. I recommended for him to continue on observation for now with repeat CT scan of the chest, abdomen and pelvis in 3 months for reevaluation of his disease. For the history of deep venous thrombosis, he will continue his treatment with Xarelto. The patient was advised to call if he has any concerning symptoms in the interval. The patient  voices understanding of current disease status and treatment options and is in agreement with the current care plan.  All questions were answered. The patient knows to call the clinic with any problems, questions or concerns. We can certainly see the patient much sooner if necessary.  Disclaimer: This note was dictated with voice recognition software. Similar sounding words can inadvertently be transcribed and may not be corrected upon review.

## 2015-02-12 NOTE — Patient Instructions (Signed)

## 2015-02-13 ENCOUNTER — Telehealth: Payer: Self-pay | Admitting: Internal Medicine

## 2015-02-13 NOTE — Telephone Encounter (Signed)
cld & spoke to Bonnie(wife)gave pt appt time & date-adv [facility] will call to sch scan-Bonnie stated will come by to get contrast

## 2015-02-20 ENCOUNTER — Other Ambulatory Visit: Payer: Self-pay | Admitting: Internal Medicine

## 2015-02-21 ENCOUNTER — Other Ambulatory Visit: Payer: Self-pay | Admitting: Internal Medicine

## 2015-02-23 ENCOUNTER — Other Ambulatory Visit: Payer: Self-pay | Admitting: Internal Medicine

## 2015-02-23 NOTE — Telephone Encounter (Signed)
VM message from patient's wife requesting refill on pt's tussionex. Call pt's wife when this has been called in

## 2015-02-27 ENCOUNTER — Other Ambulatory Visit: Payer: Self-pay | Admitting: Medical Oncology

## 2015-02-27 ENCOUNTER — Telehealth: Payer: Self-pay | Admitting: Medical Oncology

## 2015-02-27 DIAGNOSIS — C3492 Malignant neoplasm of unspecified part of left bronchus or lung: Secondary | ICD-10-CM

## 2015-02-27 MED ORDER — HYDROCOD POLST-CPM POLST ER 10-8 MG/5ML PO SUER: ORAL | Status: AC

## 2015-02-27 NOTE — Progress Notes (Signed)
I called wife and left message to to pick up rx tomorrow.

## 2015-02-27 NOTE — Addendum Note (Signed)
Addended by: Ardeen Garland on: 02/27/2015 04:59 PM   Modules accepted: Orders, Medications

## 2015-02-27 NOTE — Telephone Encounter (Signed)
I called in tussionex - wife notified.

## 2015-03-01 ENCOUNTER — Other Ambulatory Visit: Payer: Self-pay | Admitting: Internal Medicine

## 2015-03-02 ENCOUNTER — Telehealth: Payer: Self-pay | Admitting: Medical Oncology

## 2015-03-02 NOTE — Telephone Encounter (Signed)
Ativan refill called to local pharmacy.

## 2015-03-12 ENCOUNTER — Telehealth: Payer: Self-pay | Admitting: *Deleted

## 2015-03-12 ENCOUNTER — Other Ambulatory Visit: Payer: Self-pay | Admitting: Medical Oncology

## 2015-03-12 DIAGNOSIS — R918 Other nonspecific abnormal finding of lung field: Secondary | ICD-10-CM

## 2015-03-12 DIAGNOSIS — C3490 Malignant neoplasm of unspecified part of unspecified bronchus or lung: Secondary | ICD-10-CM

## 2015-03-12 DIAGNOSIS — M545 Low back pain, unspecified: Secondary | ICD-10-CM

## 2015-03-12 DIAGNOSIS — R110 Nausea: Secondary | ICD-10-CM

## 2015-03-12 DIAGNOSIS — T451X5A Adverse effect of antineoplastic and immunosuppressive drugs, initial encounter: Secondary | ICD-10-CM

## 2015-03-12 MED ORDER — OXYCODONE HCL 5 MG PO TABS: 5.0000 mg | ORAL_TABLET | Freq: Four times a day (QID) | ORAL | Status: DC | PRN

## 2015-03-12 MED ORDER — MORPHINE SULFATE ER 30 MG PO TBCR: 30.0000 mg | EXTENDED_RELEASE_TABLET | Freq: Two times a day (BID) | ORAL | Status: AC

## 2015-03-12 MED ORDER — LORAZEPAM 0.5 MG PO TABS: ORAL_TABLET | ORAL | Status: AC

## 2015-03-12 NOTE — Progress Notes (Signed)
rx locked up and wife notified to pick up rx.

## 2015-03-12 NOTE — Telephone Encounter (Signed)
Patient called requesting refill for Morphine, oxycodone and lorazepam.  Needs today asap  Return number when ready for pick up is [phone] or mobile [phone].

## 2015-03-16 ENCOUNTER — Other Ambulatory Visit: Payer: Self-pay | Admitting: Medical Oncology

## 2015-03-16 IMAGING — CR DG CHEST 2V
2 series · 2 of 2 positions shown · non-contrast
Comparison: DG CHEST 2V dated 04/14/2013

CLINICAL DATA: Cough and hemoptysis

EXAM:
CHEST  2 VIEW

[w chest pa]
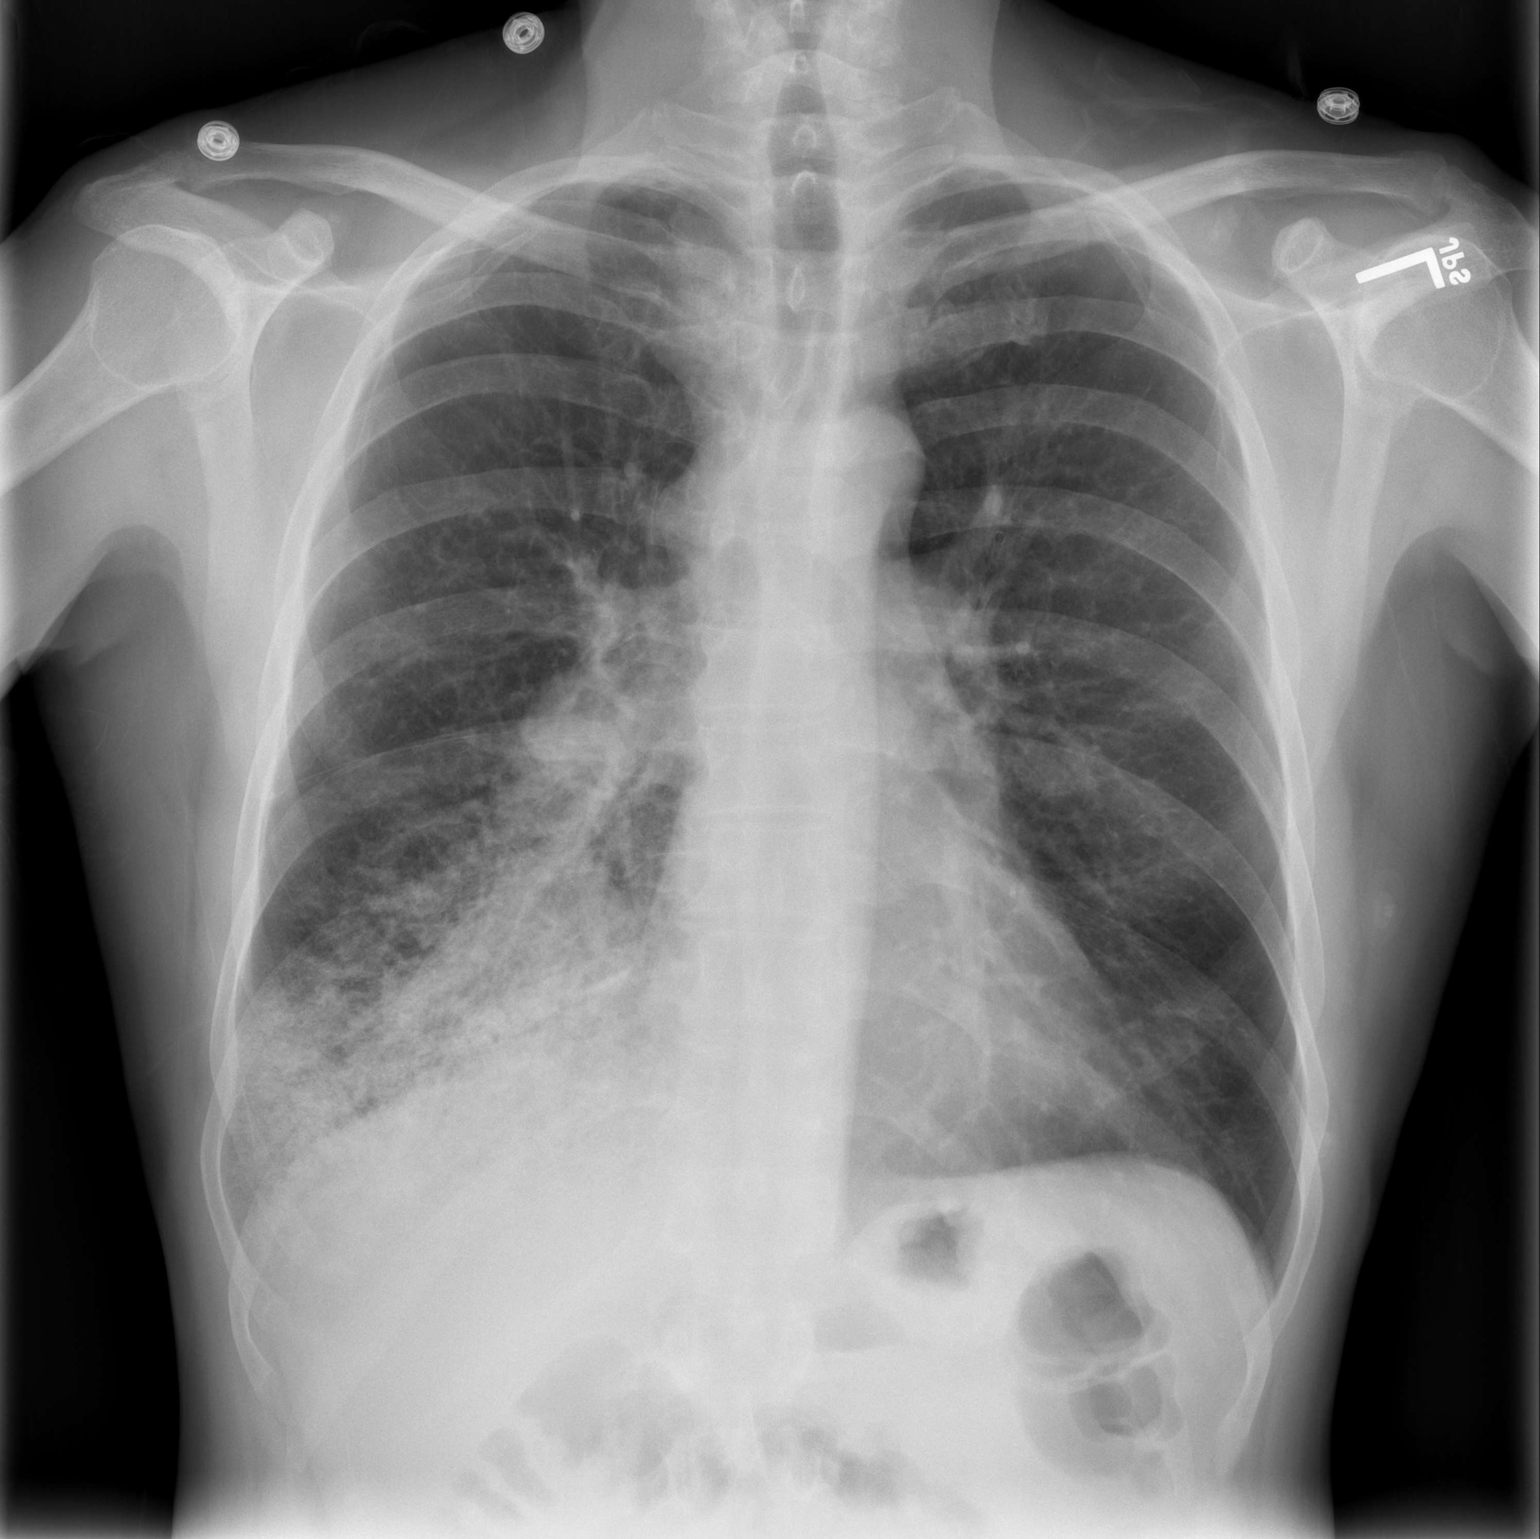

[w chest lat]
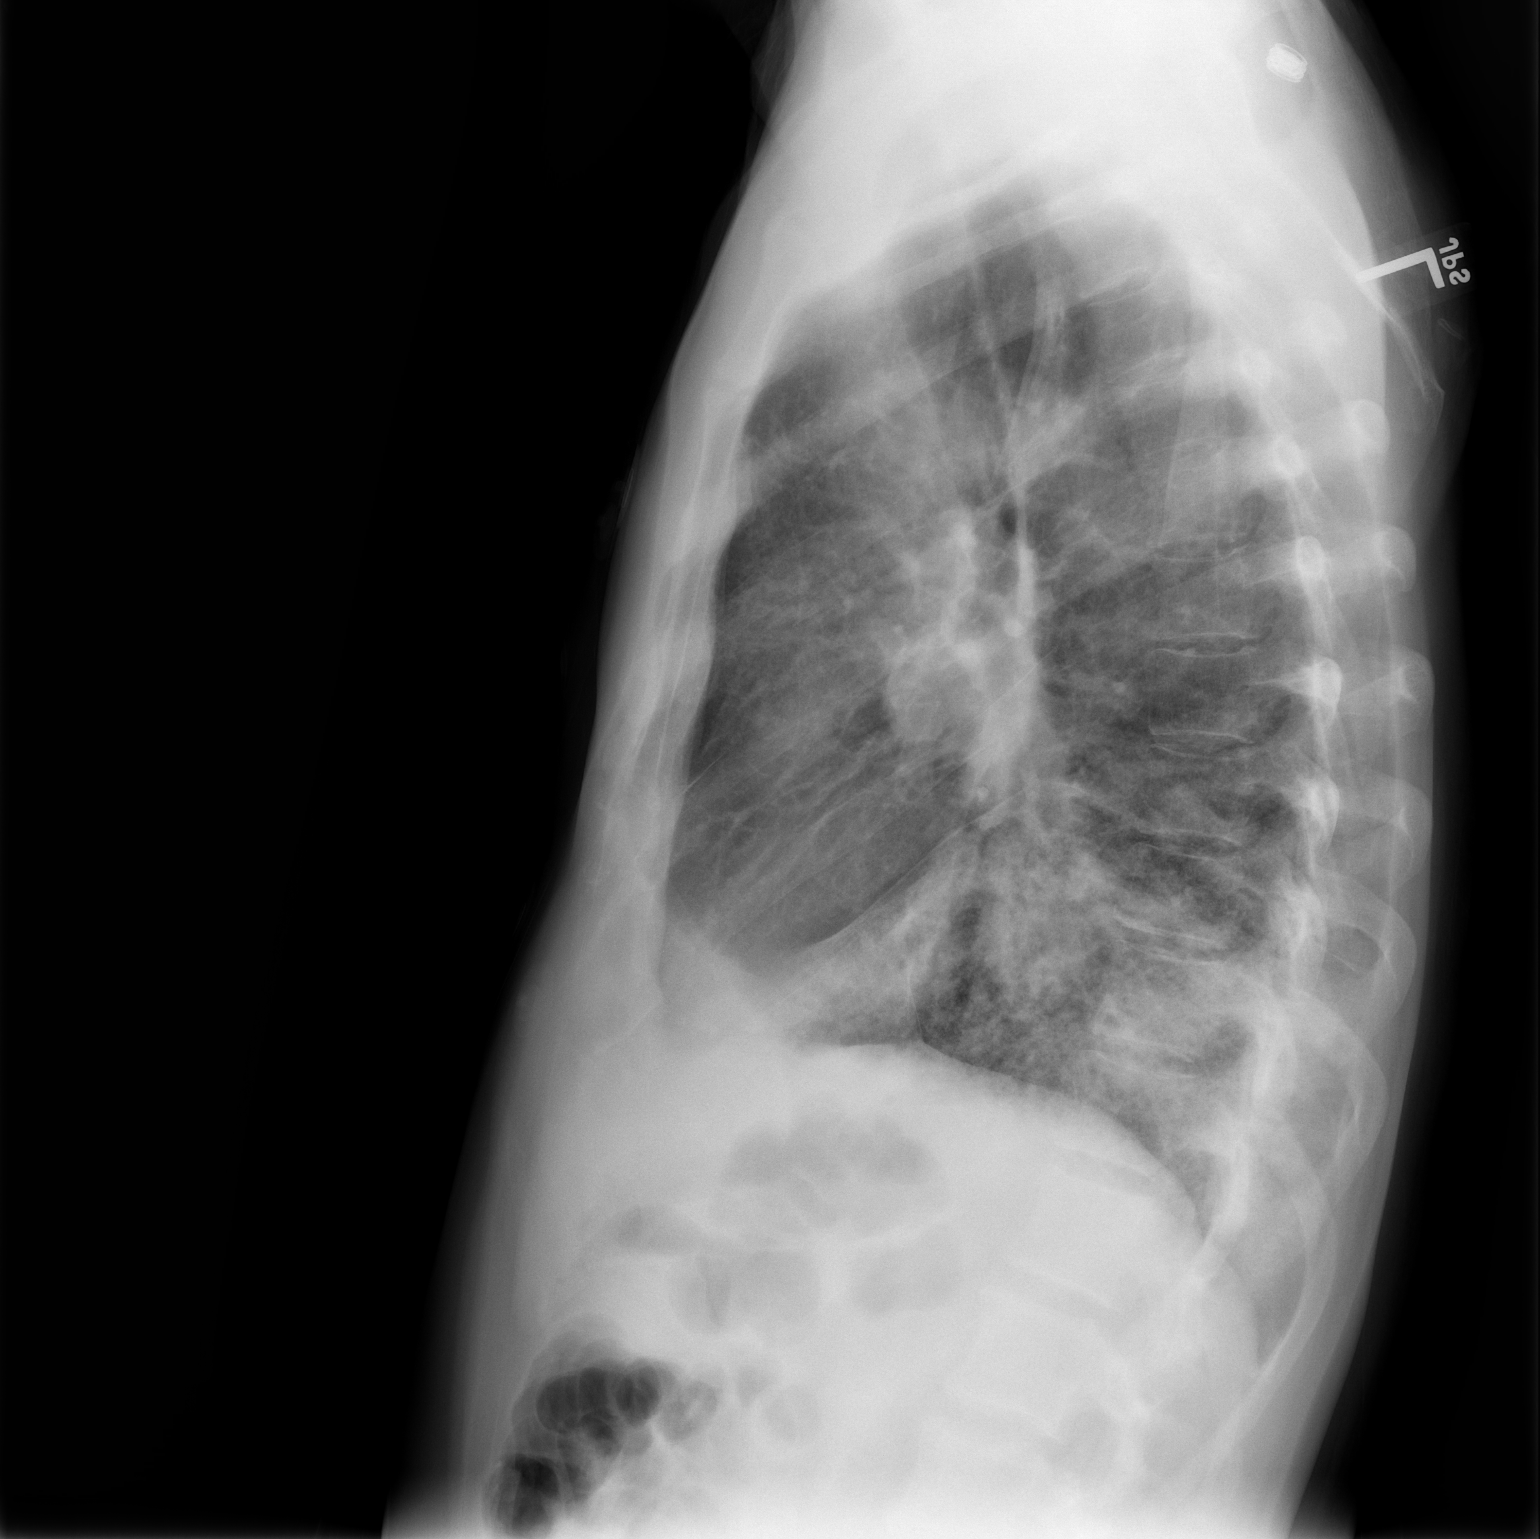

[2 of 2 positions shown; findings below may reference images not displayed]

FINDINGS: There has been an interval increase in the density of the confluent
infiltrate in the right lower lobe consistent with progressive
pneumonia. The right middle and upper lobes appear clear. The left
lung appears clear. No significant pleural effusion is demonstrated.
The cardiac silhouette is normal in size. The pulmonary vascularity
is not engorged. The trachea is midline. There is soft tissue
fullness in the right hilar region which may reflect
lymphadenopathy. This is not new.
IMPRESSION: Progressive increased density in the right lower lobe is present and
is consistent with worsening atelectasis or pneumonia. Soft tissue
fullness in the hilar region is suspicious for lymphadenopathy or
mass. Chest CT scanning is recommended.

## 2015-03-16 MED ORDER — OMEPRAZOLE 40 MG PO CPDR: DELAYED_RELEASE_CAPSULE | ORAL | Status: AC

## 2015-03-16 NOTE — Progress Notes (Signed)
Grandson requests refill on prilosec and to send to [facility].

## 2015-03-26 ENCOUNTER — Inpatient Hospital Stay (HOSPITAL_COMMUNITY): Payer: BLUE CROSS/BLUE SHIELD

## 2015-03-26 ENCOUNTER — Emergency Department (HOSPITAL_COMMUNITY): Payer: BLUE CROSS/BLUE SHIELD

## 2015-03-26 ENCOUNTER — Encounter (HOSPITAL_COMMUNITY): Payer: Self-pay | Admitting: Emergency Medicine

## 2015-03-26 ENCOUNTER — Inpatient Hospital Stay (HOSPITAL_COMMUNITY)
Admission: EM | Admit: 2015-03-26 | Discharge: 2015-03-29 | DRG: 054 | Disposition: A | Discharge status: Hospice | Payer: BLUE CROSS/BLUE SHIELD | Attending: Internal Medicine | Admitting: Internal Medicine

## 2015-03-26 ENCOUNTER — Telehealth: Payer: Self-pay | Admitting: Medical Oncology

## 2015-03-26 DIAGNOSIS — J439 Emphysema, unspecified: Secondary | ICD-10-CM | POA: Diagnosis present

## 2015-03-26 DIAGNOSIS — Z7901 Long term (current) use of anticoagulants: Secondary | ICD-10-CM

## 2015-03-26 DIAGNOSIS — Z515 Encounter for palliative care: Secondary | ICD-10-CM | POA: Diagnosis present

## 2015-03-26 DIAGNOSIS — M545 Low back pain, unspecified: Secondary | ICD-10-CM

## 2015-03-26 DIAGNOSIS — Z803 Family history of malignant neoplasm of breast: Secondary | ICD-10-CM

## 2015-03-26 DIAGNOSIS — C787 Secondary malignant neoplasm of liver and intrahepatic bile duct: Secondary | ICD-10-CM | POA: Diagnosis present

## 2015-03-26 DIAGNOSIS — Z888 Allergy status to other drugs, medicaments and biological substances status: Secondary | ICD-10-CM

## 2015-03-26 DIAGNOSIS — Z923 Personal history of irradiation: Secondary | ICD-10-CM | POA: Diagnosis not present

## 2015-03-26 DIAGNOSIS — R630 Anorexia: Secondary | ICD-10-CM | POA: Diagnosis not present

## 2015-03-26 DIAGNOSIS — R918 Other nonspecific abnormal finding of lung field: Secondary | ICD-10-CM

## 2015-03-26 DIAGNOSIS — Z79899 Other long term (current) drug therapy: Secondary | ICD-10-CM | POA: Diagnosis not present

## 2015-03-26 DIAGNOSIS — C3490 Malignant neoplasm of unspecified part of unspecified bronchus or lung: Secondary | ICD-10-CM | POA: Diagnosis not present

## 2015-03-26 DIAGNOSIS — Z9981 Dependence on supplemental oxygen: Secondary | ICD-10-CM | POA: Diagnosis not present

## 2015-03-26 DIAGNOSIS — M5030 Other cervical disc degeneration, unspecified cervical region: Secondary | ICD-10-CM | POA: Diagnosis present

## 2015-03-26 DIAGNOSIS — Z87891 Personal history of nicotine dependence: Secondary | ICD-10-CM

## 2015-03-26 DIAGNOSIS — J449 Chronic obstructive pulmonary disease, unspecified: Secondary | ICD-10-CM | POA: Diagnosis present

## 2015-03-26 DIAGNOSIS — Z86718 Personal history of other venous thrombosis and embolism: Secondary | ICD-10-CM

## 2015-03-26 DIAGNOSIS — C7931 Secondary malignant neoplasm of brain: Principal | ICD-10-CM

## 2015-03-26 DIAGNOSIS — C3491 Malignant neoplasm of unspecified part of right bronchus or lung: Secondary | ICD-10-CM | POA: Diagnosis not present

## 2015-03-26 DIAGNOSIS — I824Y9 Acute embolism and thrombosis of unspecified deep veins of unspecified proximal lower extremity: Secondary | ICD-10-CM | POA: Diagnosis not present

## 2015-03-26 DIAGNOSIS — K219 Gastro-esophageal reflux disease without esophagitis: Secondary | ICD-10-CM | POA: Diagnosis present

## 2015-03-26 DIAGNOSIS — J9611 Chronic respiratory failure with hypoxia: Secondary | ICD-10-CM | POA: Diagnosis present

## 2015-03-26 DIAGNOSIS — M549 Dorsalgia, unspecified: Secondary | ICD-10-CM | POA: Diagnosis present

## 2015-03-26 DIAGNOSIS — E43 Unspecified severe protein-calorie malnutrition: Secondary | ICD-10-CM

## 2015-03-26 DIAGNOSIS — R0609 Other forms of dyspnea: Secondary | ICD-10-CM

## 2015-03-26 DIAGNOSIS — Z66 Do not resuscitate: Secondary | ICD-10-CM | POA: Diagnosis present

## 2015-03-26 DIAGNOSIS — J9610 Chronic respiratory failure, unspecified whether with hypoxia or hypercapnia: Secondary | ICD-10-CM | POA: Diagnosis present

## 2015-03-26 DIAGNOSIS — R42 Dizziness and giddiness: Secondary | ICD-10-CM

## 2015-03-26 DIAGNOSIS — Z86711 Personal history of pulmonary embolism: Secondary | ICD-10-CM | POA: Diagnosis not present

## 2015-03-26 DIAGNOSIS — R32 Unspecified urinary incontinence: Secondary | ICD-10-CM

## 2015-03-26 DIAGNOSIS — I10 Essential (primary) hypertension: Secondary | ICD-10-CM | POA: Diagnosis present

## 2015-03-26 DIAGNOSIS — I951 Orthostatic hypotension: Secondary | ICD-10-CM

## 2015-03-26 DIAGNOSIS — Z833 Family history of diabetes mellitus: Secondary | ICD-10-CM | POA: Diagnosis not present

## 2015-03-26 DIAGNOSIS — E785 Hyperlipidemia, unspecified: Secondary | ICD-10-CM | POA: Diagnosis present

## 2015-03-26 DIAGNOSIS — E860 Dehydration: Secondary | ICD-10-CM

## 2015-03-26 DIAGNOSIS — I82409 Acute embolism and thrombosis of unspecified deep veins of unspecified lower extremity: Secondary | ICD-10-CM | POA: Diagnosis present

## 2015-03-26 DIAGNOSIS — R350 Frequency of micturition: Secondary | ICD-10-CM | POA: Diagnosis present

## 2015-03-26 DIAGNOSIS — Z825 Family history of asthma and other chronic lower respiratory diseases: Secondary | ICD-10-CM | POA: Diagnosis not present

## 2015-03-26 DIAGNOSIS — K5900 Constipation, unspecified: Secondary | ICD-10-CM | POA: Diagnosis present

## 2015-03-26 DIAGNOSIS — I959 Hypotension, unspecified: Secondary | ICD-10-CM | POA: Diagnosis present

## 2015-03-26 DIAGNOSIS — Z79891 Long term (current) use of opiate analgesic: Secondary | ICD-10-CM

## 2015-03-26 DIAGNOSIS — R296 Repeated falls: Secondary | ICD-10-CM | POA: Diagnosis present

## 2015-03-26 DIAGNOSIS — E46 Unspecified protein-calorie malnutrition: Secondary | ICD-10-CM

## 2015-03-26 DIAGNOSIS — Z681 Body mass index (BMI) 19 or less, adult: Secondary | ICD-10-CM

## 2015-03-26 DIAGNOSIS — Z86010 Personal history of colonic polyps: Secondary | ICD-10-CM

## 2015-03-26 DIAGNOSIS — R531 Weakness: Secondary | ICD-10-CM

## 2015-03-26 DIAGNOSIS — G936 Cerebral edema: Secondary | ICD-10-CM | POA: Diagnosis present

## 2015-03-26 DIAGNOSIS — Z881 Allergy status to other antibiotic agents status: Secondary | ICD-10-CM

## 2015-03-26 LAB — COMPREHENSIVE METABOLIC PANEL
ALBUMIN: 2.9 g/dL — AB (ref 3.5–5.0)
ALK PHOS: 95 U/L (ref 38–126)
ALT: 9 U/L — AB (ref 17–63)
ANION GAP: 7 (ref 5–15)
AST: 14 U/L — ABNORMAL LOW (ref 15–41)
BUN: 12 mg/dL (ref 6–20)
CHLORIDE: 99 mmol/L — AB (ref 101–111)
CO2: 29 mmol/L (ref 22–32)
CREATININE: 0.67 mg/dL (ref 0.61–1.24)
Calcium: 8.5 mg/dL — ABNORMAL LOW (ref 8.9–10.3)
GFR calc non Af Amer: >60 mL/min (ref >60)
GLUCOSE: 146 mg/dL — AB (ref 65–99)
Potassium: 4.1 mmol/L (ref 3.5–5.1)
SODIUM: 135 mmol/L (ref 135–145)
Total Bilirubin: 0.7 mg/dL (ref 0.3–1.2)
Total Protein: 6.6 g/dL (ref 6.5–8.1)

## 2015-03-26 LAB — CBC WITH DIFFERENTIAL/PLATELET
Basophils Absolute: 0 10*3/uL (ref 0.0–0.1)
Basophils Relative: 0 %
Eosinophils Absolute: 0.1 10*3/uL (ref 0.0–0.7)
Eosinophils Relative: 1 %
HEMATOCRIT: 33.7 % — AB (ref 39.0–52.0)
HEMOGLOBIN: 11.2 g/dL — AB (ref 13.0–17.0)
LYMPHS ABS: 0.8 10*3/uL (ref 0.7–4.0)
Lymphocytes Relative: 11 %
MCH: 33.6 pg (ref 26.0–34.0)
MCHC: 33.2 g/dL (ref 30.0–36.0)
MCV: 101.2 fL — AB (ref 78.0–100.0)
MONO ABS: 0.3 10*3/uL (ref 0.1–1.0)
MONOS PCT: 4 %
NEUTROS ABS: 5.5 10*3/uL (ref 1.7–7.7)
NEUTROS PCT: 84 %
Platelets: 161 10*3/uL (ref 150–400)
RBC: 3.33 MIL/uL — ABNORMAL LOW (ref 4.22–5.81)
RDW: 14 % (ref 11.5–15.5)
WBC: 6.6 10*3/uL (ref 4.0–10.5)

## 2015-03-26 LAB — I-STAT CG4 LACTIC ACID, ED
Lactic Acid, Venous: 0.94 mmol/L (ref 0.5–2.0)
Lactic Acid, Venous: 1.07 mmol/L (ref 0.5–2.0)

## 2015-03-26 LAB — I-STAT TROPONIN, ED: Troponin i, poc: 0 ng/mL (ref 0.00–0.08)

## 2015-03-26 MED ORDER — RIVAROXABAN 20 MG PO TABS
20.0000 mg | ORAL_TABLET | Freq: Every day | ORAL | Status: DC
  Administered 2015-03-26 – 2015-03-29 (×4): 20 mg via ORAL
  Filled 2015-03-26 (×4): qty 1

## 2015-03-26 MED ORDER — DM-GUAIFENESIN ER 30-600 MG PO TB12
1.0000 | ORAL_TABLET | Freq: Two times a day (BID) | ORAL | Status: DC
  Filled 2015-03-26: qty 1

## 2015-03-26 MED ORDER — MORPHINE SULFATE ER 30 MG PO TBCR
30.0000 mg | EXTENDED_RELEASE_TABLET | Freq: Two times a day (BID) | ORAL | Status: DC
  Administered 2015-03-26 – 2015-03-29 (×6): 30 mg via ORAL
  Filled 2015-03-26 (×6): qty 1

## 2015-03-26 MED ORDER — GADOBENATE DIMEGLUMINE 529 MG/ML IV SOLN
10.0000 mL | Freq: Once | INTRAVENOUS | Status: AC | PRN
  Administered 2015-03-26: 10 mL via INTRAVENOUS

## 2015-03-26 MED ORDER — GERITOL TONIC PO LIQD
1.0000 | Freq: Every morning | ORAL | Status: DC
Start: 2015-03-27 — End: 2015-03-26

## 2015-03-26 MED ORDER — DM-GUAIFENESIN ER 30-600 MG PO TB12
1.0000 | ORAL_TABLET | Freq: Two times a day (BID) | ORAL | Status: DC
  Administered 2015-03-26 – 2015-03-29 (×6): 1 via ORAL
  Filled 2015-03-26 (×6): qty 1

## 2015-03-26 MED ORDER — PANTOPRAZOLE SODIUM 40 MG PO TBEC
40.0000 mg | DELAYED_RELEASE_TABLET | Freq: Every day | ORAL | Status: DC
Start: 2015-03-26 — End: 2015-03-29
  Administered 2015-03-26 – 2015-03-29 (×4): 40 mg via ORAL
  Filled 2015-03-26 (×4): qty 1

## 2015-03-26 MED ORDER — ACETAMINOPHEN 325 MG PO TABS
650.0000 mg | ORAL_TABLET | Freq: Four times a day (QID) | ORAL | Status: DC | PRN
  Administered 2015-03-28: 650 mg via ORAL
  Filled 2015-03-26: qty 2

## 2015-03-26 MED ORDER — ALBUTEROL SULFATE (2.5 MG/3ML) 0.083% IN NEBU: 2.5000 mg | INHALATION_SOLUTION | RESPIRATORY_TRACT | Status: DC | PRN

## 2015-03-26 MED ORDER — LORAZEPAM 0.5 MG PO TABS
0.5000 mg | ORAL_TABLET | Freq: Every day | ORAL | Status: DC | PRN
  Administered 2015-03-28 (×2): 0.5 mg via ORAL
  Filled 2015-03-26 (×2): qty 1

## 2015-03-26 MED ORDER — ACETAMINOPHEN 650 MG RE SUPP: 650.0000 mg | Freq: Four times a day (QID) | RECTAL | Status: DC | PRN

## 2015-03-26 MED ORDER — HYDROCOD POLST-CPM POLST ER 10-8 MG/5ML PO SUER: 5.0000 mL | Freq: Two times a day (BID) | ORAL | Status: DC | PRN

## 2015-03-26 MED ORDER — DOCUSATE SODIUM 100 MG PO CAPS: 300.0000 mg | ORAL_CAPSULE | Freq: Two times a day (BID) | ORAL | Status: DC | PRN

## 2015-03-26 MED ORDER — ENOXAPARIN SODIUM 40 MG/0.4ML ~~LOC~~ SOLN: 40.0000 mg | SUBCUTANEOUS | Status: DC

## 2015-03-26 MED ORDER — ADULT MULTIVITAMIN W/MINERALS CH
1.0000 | ORAL_TABLET | Freq: Every day | ORAL | Status: DC
  Administered 2015-03-27 – 2015-03-29 (×3): 1 via ORAL
  Filled 2015-03-26 (×3): qty 1

## 2015-03-26 MED ORDER — POLYETHYLENE GLYCOL 3350 17 GM/SCOOP PO POWD
17.0000 g | Freq: Every day | ORAL | Status: DC
  Filled 2015-03-26: qty 255

## 2015-03-26 MED ORDER — SODIUM CHLORIDE 0.9 % IV SOLN
INTRAVENOUS | Status: DC
  Administered 2015-03-26 – 2015-03-29 (×6): via INTRAVENOUS

## 2015-03-26 MED ORDER — MIRTAZAPINE 15 MG PO TABS
30.0000 mg | ORAL_TABLET | Freq: Every day | ORAL | Status: DC
  Administered 2015-03-26 – 2015-03-28 (×3): 30 mg via ORAL
  Filled 2015-03-26 (×3): qty 2

## 2015-03-26 MED ORDER — ALBUTEROL SULFATE (2.5 MG/3ML) 0.083% IN NEBU: 3.0000 mL | INHALATION_SOLUTION | RESPIRATORY_TRACT | Status: DC | PRN

## 2015-03-26 MED ORDER — ALBUTEROL SULFATE (2.5 MG/3ML) 0.083% IN NEBU
5.0000 mg | INHALATION_SOLUTION | Freq: Once | RESPIRATORY_TRACT | Status: AC
  Administered 2015-03-26: 5 mg via RESPIRATORY_TRACT
  Filled 2015-03-26: qty 6

## 2015-03-26 MED ORDER — MAGNESIUM HYDROXIDE 400 MG/5ML PO SUSP: 5.0000 mL | Freq: Every day | ORAL | Status: DC | PRN

## 2015-03-26 MED ORDER — PROCHLORPERAZINE MALEATE 10 MG PO TABS: 10.0000 mg | ORAL_TABLET | Freq: Four times a day (QID) | ORAL | Status: DC | PRN

## 2015-03-26 MED ORDER — ONDANSETRON HCL 8 MG PO TABS: 8.0000 mg | ORAL_TABLET | Freq: Three times a day (TID) | ORAL | Status: DC | PRN

## 2015-03-26 MED ORDER — OXYCODONE HCL 5 MG PO TABS
5.0000 mg | ORAL_TABLET | Freq: Four times a day (QID) | ORAL | Status: DC | PRN
  Administered 2015-03-27 – 2015-03-29 (×5): 5 mg via ORAL
  Filled 2015-03-26 (×5): qty 1

## 2015-03-26 MED ORDER — POLYETHYLENE GLYCOL 3350 17 G PO PACK
17.0000 g | PACK | Freq: Every day | ORAL | Status: DC
Start: 2015-03-26 — End: 2015-03-29
  Administered 2015-03-26 – 2015-03-29 (×4): 17 g via ORAL
  Filled 2015-03-26 (×7): qty 1

## 2015-03-26 NOTE — ED Notes (Addendum)
Pt can go to floor at 16:15

## 2015-03-26 NOTE — ED Notes (Signed)
Patient transported to CT 

## 2015-03-26 NOTE — ED Notes (Signed)
Pt with Hx of lung cancer with metastasis to brain c/o nausea, lightheaded, weakness in legs, headaches, back pain, malaise, urinary incontinence with dark brown malodorous urine, hemoptysis, SOB. C/o new pain to unaffected lung. Pt's last chemo treatment in late November 2016. Pt wears 2 L/min O2 at home.

## 2015-03-26 NOTE — Progress Notes (Signed)
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  PRIOR THERAPY: 1. First-line chemotherapy with 5 cycles of carboplatin (cisplatin for the first cycle) and etoposide from 05/18/2013 to 10/15/2013.  2. WB XRT and palliative chest radiation given on 05/03/13 - 05/18/13 per Dr. Pablo Ledger. 3. Systemic chemotherapy with carboplatin for AUC of 5 on day 1 and etoposide 100 MG/M2 on days 1, 2 and 3 with Neulasta support on day 4. Status post 6 cycles. Last dose was given 01/08/2015.  CURRENT THERAPY: Observation.  Subjective: The patient is seen and examined today. His daughter and son were at the bedside. This is a very pleasant 65 years old white male with extensive stage small cell lung cancer status post 2 courses of systemic chemotherapy with carboplatin and etoposide with significant improvement in his disease after the second course of his treatment. The patient has been on observation. He presented to the emergency department today complaining of increasing weakness in his lower extremities and inability to walk. This has been progressing over the last 2 weeks. He also has poor appetite and malnutrition. He also complained of some urinary incontinence. He presented to the emergency department for further evaluation of his condition. I discussed the condition with Dr.Beaton and recommended for the patient to have repeat MRI of the brain as well as imaging studies of the thoracolumbar spine. When seen today the patient continues to have generalized weakness. He denied having any significant chest pain but has shortness of breath with exertion with no cough or hemoptysis. He denied having any fever or chills. He has no nausea or vomiting.  Objective: Vital signs in last 24 hours: Temp:  [97.6 F (36.4 C)-97.7 F (36.5 C)] 97.6 F (36.4 C) (01/30 1446) Pulse Rate:  [71-96] 79 (01/30 1540) Resp:  [10-16] 10 (01/30 1540) BP: (88-113)/(69-81)  88/69 mmHg (01/30 1540) SpO2:  [88 %-96 %] 92 % (01/30 1540) Weight:  [107 lb 3.2 oz (48.626 kg)] 107 lb 3.2 oz (48.626 kg) (01/30 1749)  Intake/Output from previous day:   Intake/Output this shift:    General appearance: alert, cooperative, fatigued and mild distress Resp: clear to auscultation bilaterally Cardio: regular rate and rhythm, S1, S2 normal, no murmur, click, rub or gallop GI: soft, non-tender; bowel sounds normal; no masses,  no organomegaly Extremities: extremities normal, atraumatic, no cyanosis or edema  Lab Results:   Recent Labs  03/26/15 1243  WBC 6.6  HGB 11.2*  HCT 33.7*  PLT 161   BMET  Recent Labs  03/26/15 1243  NA 135  K 4.1  CL 99*  CO2 29  GLUCOSE 146*  BUN 12  CREATININE 0.67  CALCIUM 8.5*    Studies/Results: Dg Chest 2 View  03/26/2015  CLINICAL DATA:  65 year old 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 EXAM: CHEST  2 VIEW COMPARISON:  CT chest 02/07/2015 and earlier. Chest x-rays 09/01/2014 and earlier. FINDINGS: Cardiac silhouette normal in size, unchanged. No evidence of significant hilar lymphadenopathy currently. Emphysematous changes throughout both lungs as noted previously. Airspace consolidation throughout the right lower lobe, not significantly changed dating back to at least late 2015. Discrete oval opacity in the right perihilar region, likely the superior segment right lower lobe, which likely corresponds to the dense airspace consolidation identified on the most recent CT. No new abnormalities. Right jugular Port-A-Cath tip projects over the lower SVC, right jugular  Port-A-Cath tip in the lower SVC at or near the cavoatrial junction, unchanged. IMPRESSION: 1. Chronic airspace opacities throughout the right lower lobe dating back to late 2015. 2. Discrete oval opacity in the right perihilar region corresponds to dense  airspace consolidation in the superior segment right lower lobe identified on the chest CT 6 weeks ago. 3. No new/acute pulmonary abnormalities. Electronically Signed   By: Evangeline Dakin M.D.   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 EXAM: CT CERVICAL SPINE WITHOUT CONTRAST TECHNIQUE: Multidetector CT imaging of the cervical spine was performed without intravenous contrast. Multiplanar CT image reconstructions were also generated. COMPARISON:  No prior cervical spine imaging.  PET-CT 03/13/2014. FINDINGS: No fractures identified involving the cervical spine. No evidence of osseous metastatic disease. Sagittal reconstructed images demonstrate anatomic alignment. Mild disc space narrowing and endplate hypertrophic changes at C6-7. Remaining disc spaces well preserved. Facet joints intact throughout without significant degenerative change. No spinal stenosis. Soft tissue window images demonstrate no frank disc protrusions. Coronal reformatted images demonstrate an intact craniocervical junction, intact C1-C2 articulation with degenerative changes, intact dens and intact lateral masses. No significant bony foraminal stenoses at any cervical level. IMPRESSION: Mild degenerative disc disease and spondylosis at C6-7 without associated disc protrusion or extrusion. No significant abnormality otherwise. Specifically, no evidence of cervical spine metastatic disease. Electronically Signed   By: Evangeline Dakin M.D.   On: 03/26/2015 16:32   Ct Lumbar Spine Wo Contrast  03/26/2015  CLINICAL DATA:  Lung cancer with brain metastasis. Leg weakness. Headaches. Back pain. EXAM: CT LUMBAR SPINE WITHOUT CONTRAST TECHNIQUE: Multidetector CT imaging of the lumbar spine was performed without intravenous contrast  administration. Multiplanar CT image reconstructions were also generated. COMPARISON:  02/07/2015 abdominal pelvic CT. FINDINGS: Soft tissues: Advanced abdominal aortic and pelvic atherosclerosis. No imaged retroperitoneal adenopathy. Left renal fluid density lesion which is likely a cyst. No evidence of dominant intrathecal mass. Mild disc bulging at L3-4, causing mild central canal stenosis.Mild disc bulges are also identified at L4-5 and L5-S1. Bones: Stable left iliac peripherally sclerotic lesion at 1.0 cm has been present back to 06/15/2013 and is favored to be benign. No other focal osseous lesion identified. Maintenance of vertebral body height and alignment. Minimal convex left lumbar spine curvature. IMPRESSION: 1. Lumbar spondylosis, without acute osseous finding. 2. No evidence of metastatic disease to the lumbosacral spine. Please note that CT is of low sensitivity, and the test of choice is pre and post contrast lumbar spine MRI. Electronically Signed   By: Abigail Miyamoto M.D.   On: 03/26/2015 16:36   Mr Jeri Cos TF Contrast  03/26/2015  CLINICAL DATA:  65 year old male with extensive stage small cell lung cancer. Status post whole brain radiation in 2015. Most recent chemotherapy treatment in November 2016. Nausea, weakness, headaches. Subsequent encounter. EXAM: MRI HEAD WITHOUT AND WITH CONTRAST TECHNIQUE: Multiplanar, multiecho pulse sequences of the brain and surrounding structures were obtained without and with intravenous contrast. CONTRAST:  69m MULTIHANCE GADOBENATE DIMEGLUMINE 529 MG/ML IV SOLN COMPARISON:  Brain MRI 06/15/2014 and earlier FINDINGS: The area of the anterior right midbrain with questionable abnormal enhancement on 06/15/2014 today appears normal. However, there are numerous new brain metastases, widespread throughout the bilateral cerebral and cerebellar hemispheres. There is right brainstem involvement at the pons where  a round metastasis measures 7 mm (series 14, image 17).  Lesion size ranges from 3-4 mm (right caudate nucleus) to 12 mm diameter (medial left thalamus). Occasional dural based lesions are identified (superior left parietal lobe series 13, image 15). At least 30 individual brain metastases are present. (At least 11 in the posterior fossa alone). Some of these are diffusion restricted in keeping with hypercellularity. A left caudate metastasis is mildly hemorrhagic (series 8, image 13). There is associated regional cerebral edema, most pronounced at the left thalamus and right brainstem. There is underlying chronic confluent cerebral white matter T2 and FLAIR hyperintensity from prior whole brain radiation. No acute or significant intracranial hemorrhage at this time. No midline shift. Basilar cisterns remain patent. No ventriculomegaly. Mild generalized cerebral volume loss since 2016. Major intracranial vascular flow voids are stable. No restricted diffusion or evidence of acute infarction. Negative pituitary, cervicomedullary junction, and visualized cervical spinal cord. Visualized bone marrow signal is within normal limits. Visible internal auditory structures appear normal. Mastoids are clear. Paranasal sinuses are clear. Negative orbit and scalp soft tissues. 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 At least 30 small brain metastases ranging from 3-4 mm, up to 12 mm diameter (medial left thalamus). Occasional dural based lesions (superior left parietal lobe). Relatively mild cerebral edema with no significant intracranial mass effect at this time. Electronically Signed   By: Genevie Ann M.D.   On: 03/26/2015 17:34    Medications: I have reviewed the patient's current medications.  Assessment/Plan: 1) extensive stage small cell lung cancer: Status post 2 courses of systemic chemotherapy with carboplatin and etoposide with significant systemic improvement of his disease. The patient  is currently on observation. 2) recurrent brain metastasis: The MRI of the brain performed earlier today showed widespread recurrent metastatic disease to the brain with at least 30 small brain metastasis. He has a history of allergy to steroids so we will hold on Decadron for now. I will consult radiation oncology to reevaluate the patient for any further treatment to his brain. 3) generalized weakness and fatigue as well as urinary incontinence: Probably secondary to his metastatic brain lesions. 4) dehydration: Consider the patient for IV hydration with normal saline. 5) malnutrition: Please consult the dietitian at the hospitalist to evaluate the patient. Thank you so much for taking good care of Mr. Morabito, I will continue to follow up the patient with you and assist in his management on as-needed basis.    LOS: 0 days    Justin Meisenheimer K. 03/26/2015

## 2015-03-26 NOTE — Progress Notes (Addendum)
MRI brain shows extensive new metastatsis almost 30 lesions ( 4-12 mm) with relatively less cerebral edema and no mass effect. Consulted radiation oncology Dr Lisbeth Renshaw. Planned to put him on decadron but is listed as allergy, i spoke with Dr [name] about this. Pt had not received decadron since 2015 so amy have had true allergy. Will not order decadron for now.  Wife notified of MRI and CT results.

## 2015-03-26 NOTE — Telephone Encounter (Signed)
Wife said pt is very sick , dizzy ,fell off commode, cannot make it to bathroom in time to urinate. Legs and knees hurt, headaches off and on x 2 weeks. He was asking to go to hospital. "sick on stomach". He cannot walk by himself or get out of recliner by himself.Per [name] pt should go to ED to be evaluated. [place] notified.

## 2015-03-26 NOTE — H&P (Signed)
[facility] History and Physical  Ricky Villanueva CHY:[id number] DOB: 1950-12-08 DOA: 03/26/2015  Referring physician: Dr Audie Pinto PCP: Orpah Melter, MD   Chief Complaint:  generalized weakness 2 weeks  HPI:  65 year old male with history of COPD (emphysema), small cell lung cancer with brain metastases diagnosed in March 2015 status post 5 cycles of carboplatin and etoposide from 05/18/2013-10/15/2013 with whole-body radiation and palliative chest radiation from 05/03/2013-05/18/2013 followed by systemic chemotherapy with carboplatin and etoposide (completed 6 cycles, last dose on 01/08/2015). He was last seen by his oncologist Dr. [name] on 02/12/2015 . Repeat CT scan done around that time showed no evidence of disease progression. He recommended observation with repeat CT scan of the chest abdomen and pelvis in 3 months. Patient also has history of DVT for which she is on Xarelto. For the past 3-4 weeks  patient has been increasingly weak when he feels tired after walking a few distance and feels like his legs are trending and has to take rest before walking again. For the past 2 weeks this has progressed and he feels increasingly weak, with poor appetite, unsteady gait with few falls at home without sustaining any injury. Over the past 3-4 days this has progressed and has had significant difficulty walking steadily. He also is having difficulty making to the bathroom to urinate and has had episodes of incontinence before he is able to make it to the bathroom. This normally does not happen during the day when he is more prepared and gets help from his wife to get to the bathroom on time. He is also having some off-and-on occipital headaches without any blurred vision, nausea or vomiting. Denies any fevers or chills. He feels extremely weak overall with poor appetite. Denies any diarrhea recent travel or being on any antibiotics. Patient reports some right-sided chest pain with dyspnea on exertion.  Denies any palpitations, orthopnea or PND. For the past week he is also having pain in his upper and lower back worsened with movement. He reports some tingling in the distribution of his hands that started after chemotherapy and has unchanged. According to his wife patient also has been confused off and on for the past 2 days.  In the ED patient was found to have low blood pressure of 88/69 mmHg. O2 sat were 88% on room air improved on 2 L via nasal cannula. Chemistry showed WBC of 6.6, hemoglobin 11.2, platelets of 161. Chemistry was unremarkable except glucose of 146. Lactic acid was negative. A chest x-ray was done which was chronic if this opacity throughout right lower lobe. Discrete oval opacity in the right perihilar lesion corresponding to dense airspace consolidation in the superior segment of right lower lobe seen on chest CT 6 weeks back. No new abnormality. Patient's oncologist Dr. Earlie Server was consulted by ED physician and given his new's and worsening symptoms recommended admission to hospitalist service with need for evaluation.   Review of Systems:  Constitutional:  poor appetite and fatigue, denies fever or chills HEENT: Denies shoulder or hearing symptoms, congestion, sore throat, difficulty swallowing, neck pain or stiffness  Respiratory: Dyspnea on exertion, cough, denies chest tightness or wheezing , has some chest discomfort on the right side Cardiovascular: Denies chest pain, palpitations and leg swelling.  Gastrointestinal: Lower abdominal pain, Denies nausea, vomiting,  diarrhea, constipation, blood in stool and abdominal distention.  Genitourinary: Urgency, frequency, incontinence, Denies dysuria,  hematuria, flank pain and difficulty urinating.  Endocrine: Denies: hot or cold intolerance, , polyuria, polydipsia. Musculoskeletal:  myalgias, back pain, unsteady gait, joint swelling, arthralgias  Skin: Denies pallor, rash and wound.  Neurological:  dizziness,  lightheadedness, occipital headache, weakness frequent falls, denies seizures, syncope, , light-headedness, numbness  Hematological: Denies adenopathy.  Psychiatric/Behavioral: Confusion, denies Denies  nervousness, sleep disturbance and agitation   Past Medical History  Diagnosis Date  . Hyperlipidemia   . Hypertension   . GERD (gastroesophageal reflux disease)   . Respiratory failure with hypoxia ([place]) 04/21/2013    secondary to pneumonia/notes 04/21/2013  . Pulmonary embolism ([place])     "got one in there now" (04/21/2013)  . COPD (chronic obstructive pulmonary disease) ([place])   . Pneumonia 2/?01/2014    "wouldn't get better; hospitalized 04/21/2013)  . Arthritis     "minor in my right hand" (04/21/2013)  . History of radiation therapy 05/03/13-05/18/13    chest & whole brain  . Cancer ([place])     lung ca dx'd 03/2013  . Colon polyps     hyperplastic  . Right arm pain 08/21/2014   Past Surgical History  Procedure Laterality Date  . Inguinal hernia repair Right ~ 2005  . Finger surgery Left ~ 1989    "crushed end of my middle finger off"   . Video bronchoscopy Bilateral 04/26/2013    Procedure: VIDEO BRONCHOSCOPY WITH FLUORO;  Surgeon: Wilhelmina Mcardle, MD;  Location[facility];  Service: Cardiopulmonary;  Laterality: Bilateral;   Social History:  reports that he quit smoking about 3 years ago. His smoking use included Cigarettes. He has a 94 pack-year smoking history. He has never used smokeless tobacco. He reports that he does not drink alcohol or use illicit drugs.  Allergies  Allergen Reactions  . Decadron [Dexamethasone] Other (See Comments)    Slurred speech  . Augmentin [Amoxicillin-Pot Clavulanate] Itching and Rash    rash    Family History  Problem Relation Age of Onset  . Breast cancer Sister   . Emphysema Mother   . Diabetes Father     Prior to Admission medications   Medication Sig Start Date End Date Taking? Authorizing Provider  chlorpheniramine-HYDROcodone  (TUSSIONEX) 10-8 MG/5ML SUER TAKE ONE TEASPOONFUL BY MOUTH EVERY 12 HOURS AS NEEDED FOR COUGH 02/27/15  Yes Curt Bears, MD  docusate sodium (COLACE) 100 MG capsule Take 300 mg by mouth 2 (two) times daily as needed for mild constipation.    Yes Historical Provider, MD  Iron-Vitamins (GERITOL PO) Take 1 tablet by mouth daily.   Yes Historical Provider, MD  lidocaine-prilocaine (EMLA) cream Apply to port site one hour before treatment and cover with plastic wrap. 12/25/14  Yes Curt Bears, MD  LORazepam (ATIVAN) 0.5 MG tablet TAKE ONE TABLET BY MOUTH TWICE DAILY AS NEEDED FOR NAUSEA/ANXIETY 03/12/15  Yes Curt Bears, MD  magnesium hydroxide (MILK OF MAGNESIA) 400 MG/5ML suspension Take 5 mLs by mouth daily as needed for mild constipation.   Yes Historical Provider, MD  mirtazapine (REMERON) 30 MG tablet TAKE 1 TABLET AT BEDTIME 02/20/15  Yes Curt Bears, MD  morphine (MS CONTIN) 30 MG 12 hr tablet Take 1 tablet (30 mg total) by mouth every 12 (twelve) hours. 03/12/15  Yes Curt Bears, MD  omeprazole (PRILOSEC) 40 MG capsule TAKE 1 CAPSULE (40 MG TOTAL)  BY MOUTH DAILY. 03/16/15  Yes Curt Bears, MD  ondansetron (ZOFRAN) 8 MG tablet Take 8 mg by mouth every 8 (eight) hours as needed for nausea. Reported on 02/12/2015 09/29/14  Yes Historical Provider, MD  oxyCODONE (OXY IR/ROXICODONE) 5 MG immediate  release tablet Take 1 tablet (5 mg total) by mouth every 6 (six) hours as needed for severe pain or breakthrough pain. May take up to ii po q 6rhs prn for severe pain 03/12/15  Yes Curt Bears, MD  OXYGEN Inhale 2 L into the lungs continuous.   Yes Historical Provider, MD  polyethylene glycol powder (MIRALAX) powder Take 17 g by mouth daily. 08/20/13  Yes Jola Schmidt, MD  PROAIR HFA 108 (90 BASE) MCG/ACT inhaler USE 2 INHALATIONS EVERY 6 HOURS AS NEEDED FOR WHEEZING OR SHORTNESS OF BREATH 11/29/14  Yes Curt Bears, MD  prochlorperazine (COMPAZINE) 10 MG tablet Take 1 tablet (10 mg  total) by mouth every 6 (six) hours as needed for nausea or vomiting (nausea). 09/04/14  Yes Adrena E Johnson, PA-C  XARELTO 20 MG TABS tablet TAKE 1 TABLET (20 MG TOTAL) BY MOUTH DAILY WITH SUPPER. 11/20/14  Yes Curt Bears, MD  mirtazapine (REMERON) 30 MG tablet Take 1 tablet (30 mg total) by mouth at bedtime. Patient not taking: Reported on 03/26/2015 11/06/14   Carlton Adam, PA-C  XARELTO 20 MG TABS tablet TAKE 1 TABLET DAILY WITH SUPPER Patient not taking: Reported on 03/26/2015 02/21/15   Curt Bears, MD     Physical Exam:  Filed Vitals:   03/26/15 1202 03/26/15 1300 03/26/15 1437 03/26/15 1446  BP:  103/79 96/73   Pulse:  71 86   Temp:    97.6 F (36.4 C)  TempSrc:    Oral  Resp:  11 15   SpO2: 96% 92% 93%     Constitutional: Vital signs reviewed.  Elderly thin built male not in distress HEENT: Pallor present, no icterus, dry oral mucosa,, no cervical lymphadenopathy, supple neck Cardiovascular: RRR, S1 normal, S2 normal, no MRG Chest: Coarse breath sounds bilaterally, right-sided Port-A-Cath Abdominal: Soft. Non-tender, non-distended, bowel sounds are normal, no guarding or rigidity GU: no CVA tenderness Ext: warm, no edema Neurological: Alert and oriented, nonfocal  Labs on Admission:  Basic Metabolic Panel:  Recent Labs Lab 03/26/15 1243  NA 135  K 4.1  CL 99*  CO2 29  GLUCOSE 146*  BUN 12  CREATININE 0.67  CALCIUM 8.5*   Liver Function Tests:  Recent Labs Lab 03/26/15 1243  AST 14*  ALT 9*  ALKPHOS 95  BILITOT 0.7  PROT 6.6  ALBUMIN 2.9*   No results for input(s): LIPASE, AMYLASE in the last 168 hours. No results for input(s): AMMONIA in the last 168 hours. CBC:  Recent Labs Lab 03/26/15 1243  WBC 6.6  NEUTROABS 5.5  HGB 11.2*  HCT 33.7*  MCV 101.2*  PLT 161   Cardiac Enzymes: No results for input(s): CKTOTAL, CKMB, CKMBINDEX, TROPONINI in the last 168 hours. BNP: Invalid input(s): POCBNP CBG: No results for input(s):  GLUCAP in the last 168 hours.  Radiological Exams on Admission: Dg Chest 2 View  03/26/2015  CLINICAL DATA:  65 year old 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 EXAM: CHEST  2 VIEW COMPARISON:  CT chest 02/07/2015 and earlier. Chest x-rays 09/01/2014 and earlier. FINDINGS: Cardiac silhouette normal in size, unchanged. No evidence of significant hilar lymphadenopathy currently. Emphysematous changes throughout both lungs as noted previously. Airspace consolidation throughout the right lower lobe, not significantly changed dating back to at least late 2015. Discrete oval opacity in the right perihilar region, likely the superior segment right lower lobe, which likely corresponds to the dense  airspace consolidation identified on the most recent CT. No new abnormalities. Right jugular Port-A-Cath tip projects over the lower SVC, right jugular Port-A-Cath tip in the lower SVC at or near the cavoatrial junction, unchanged. IMPRESSION: 1. Chronic airspace opacities throughout the right lower lobe dating back to late 2015. 2. Discrete oval opacity in the right perihilar region corresponds to dense airspace consolidation in the superior segment right lower lobe identified on the chest CT 6 weeks ago. 3. No new/acute pulmonary abnormalities. Electronically Signed   By: Evangeline Dakin M.D.   On: 03/26/2015 13:41    EKG: none  Assessment/Plan Principal Problem:  Generalized weakness with frequent falls -Differential includes dehydration, orthostasis, increased brain metastases,  deconditioning and malnutrition. -Admit to telemetry. Check orthostasis. IV hydration with normal saline. Check UA and urine culture. -Check MRI of the brain with contrast. T of the cervical and lumbar spine with contrast shows spondylosis without any bony metastases.   Active Problems: Small cell lung cancer with brain  metastases Received systemic chemotherapy with carboplatin (last dose on 01/08/2015) with plan for repeat chest CT in March 2017. Ordered MRI of the brain with contrast to evaluate for worsened metastatic disease. Follows with Dr. [name] (was notified by ED physician)  Hypotension Check orthostasis. IV hydration with normal saline. Not on any blood pressure medications. Check random cortisol.  Increase urinary frequency with incontinence I think incontinence is associated with patient unable to get to the bathroom on time. This happens mainly at night and during the day when he gets help from his wife he is able to get to the bathroom on time. check UA and urine culture. CT of the lumbar spine negative for any metastatic lesion.    GERD (gastroesophageal reflux disease) Continue PPI  Severe protein calorie malnutrition Dietitian consulted.    COPD with emphysema 2 via nasal cannula, continue albuterol inhaler. Will add albuterol nebulizer as needed.  History of DVT and PE On Xarelto.  Lower abdominal, right chest and back pains.  Continue home dose of oxycodone. We'll add low-dose when necessary morphine.  Constipation Continue milk of magnesia and MiraLAX.      Diet:Regular  DVT prophylaxis: xarelto   Code Status: full code Family Communication: discussed with wife and duaghter at bedside Disposition Plan: admit to [place], [place] [facility] Pager [phone]  Total time spent on admission :70 minutes  If 7PM-7AM, please contact night-coverage [email or web address] Password [facility] 03/26/2015, 3:39 PM

## 2015-03-26 NOTE — ED Notes (Signed)
MD at bedside. 

## 2015-03-26 NOTE — ED Notes (Signed)
Pt taken from CT to MRI.  Pts family updated on status.  Pt will be transported to room upon return.

## 2015-03-27 DIAGNOSIS — C7931 Secondary malignant neoplasm of brain: Secondary | ICD-10-CM | POA: Diagnosis present

## 2015-03-27 LAB — CBC
HCT: 31 % — ABNORMAL LOW (ref 39.0–52.0)
Hemoglobin: 10.1 g/dL — ABNORMAL LOW (ref 13.0–17.0)
MCH: 32.9 pg (ref 26.0–34.0)
MCHC: 32.6 g/dL (ref 30.0–36.0)
MCV: 101 fL — ABNORMAL HIGH (ref 78.0–100.0)
Platelets: 146 10*3/uL — ABNORMAL LOW (ref 150–400)
RBC: 3.07 MIL/uL — ABNORMAL LOW (ref 4.22–5.81)
RDW: 14.1 % (ref 11.5–15.5)
WBC: 6.2 10*3/uL (ref 4.0–10.5)

## 2015-03-27 LAB — URINALYSIS, ROUTINE W REFLEX MICROSCOPIC
BILIRUBIN URINE: NEGATIVE
GLUCOSE, UA: NEGATIVE mg/dL
Hgb urine dipstick: NEGATIVE
KETONES UR: NEGATIVE mg/dL
Leukocytes, UA: NEGATIVE
NITRITE: NEGATIVE
PH: 6 (ref 5.0–8.0)
Protein, ur: NEGATIVE mg/dL
SPECIFIC GRAVITY, URINE: 1.01 (ref 1.005–1.030)

## 2015-03-27 LAB — GLUCOSE, CAPILLARY
GLUCOSE-CAPILLARY: 144 mg/dL — AB (ref 65–99)
Glucose-Capillary: 140 mg/dL — ABNORMAL HIGH (ref 65–99)
Glucose-Capillary: 205 mg/dL — ABNORMAL HIGH (ref 65–99)

## 2015-03-27 LAB — CORTISOL: CORTISOL PLASMA: 4.5 ug/dL

## 2015-03-27 MED ORDER — INSULIN ASPART 100 UNIT/ML ~~LOC~~ SOLN
0.0000 [IU] | Freq: Three times a day (TID) | SUBCUTANEOUS | Status: DC
  Administered 2015-03-27: 1 [IU] via SUBCUTANEOUS
  Administered 2015-03-27: 3 [IU] via SUBCUTANEOUS
  Administered 2015-03-28 – 2015-03-29 (×2): 1 [IU] via SUBCUTANEOUS

## 2015-03-27 MED ORDER — DEXAMETHASONE 4 MG PO TABS
4.0000 mg | ORAL_TABLET | Freq: Four times a day (QID) | ORAL | Status: DC
  Administered 2015-03-27 – 2015-03-29 (×4): 4 mg via ORAL
  Filled 2015-03-27 (×13): qty 1

## 2015-03-27 MED ORDER — SODIUM CHLORIDE 0.9% FLUSH
10.0000 mL | INTRAVENOUS | Status: DC | PRN
  Administered 2015-03-27 – 2015-03-29 (×2): 10 mL
  Filled 2015-03-27 (×2): qty 40

## 2015-03-27 NOTE — Progress Notes (Signed)
Radiation Oncology         ([phone]) [phone] ________________________________  Name: Ricky Villanueva MRN: [id number]  Date: 03/26/2015  DOB: 1951/02/08  XB:[id number], Ricky Main, MD  No ref. provider found     REFERRING PHYSICIAN: No ref. provider found   DIAGNOSIS: The primary encounter diagnosis was Small cell carcinoma of lung, right ([place]). Diagnoses of Brain metastases ([place]) and Back pain were also pertinent to this visit.   HISTORY OF PRESENT ILLNESS:Ricky Villanueva is a 65 y.o. male seen at the request of Dr. Clementeen Graham for new evidence of brain metastases. 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 He was treated with 30 Gy to the brain and right chest in 12 fractions between 05/03/2013 and 05/18/2013. He went on to receive platin and etoposide chemotherapy between 05/18/13-10/15/13. It appears that he was an NED until December 2016 and was found to have disease in the right lower lobe and right upper lobe and new hepatic metastatic disease. He was retreated with carboplatin and etoposide between July and November 2016. He did not have any evidence of progressive disease on his most recent scan in December 2016, and has since  been on chemotherapy holiday. He presented yesterday to the emergency department after about 2 weeks of urinary incontinence and progressive lower extremity weakness.   Emergency department chronic airspace opacities throughout the right lower lobe were noted on his chest x-ray with a discrete oval opacity in the right perihilar region. A CT scan of the cervical spine without contrast revealed mild degenerative disc disease. No evidence of metastatic disease was identified of the cervical spine or lumbar spine scans. An MRI of the brain however revealed widespread recurrent disease with at least 30 small brain lesions ranging from 3-4 mm up to 12 mm in greatest diameter. There are asked to see the patient for further disposition of care.  PREVIOUS  RADIATION THERAPY: Yes 30 Gy to the right chest and 30 Gy to the brain completed in March 2015   PAST MEDICAL HISTORY:  Past Medical History  Diagnosis Date  . Hyperlipidemia   . Hypertension   . GERD (gastroesophageal reflux disease)   . Respiratory failure with hypoxia ([place]) 04/21/2013    secondary to pneumonia/notes 04/21/2013  . Pulmonary embolism ([place])     "got one in there now" (04/21/2013)  . COPD (chronic obstructive pulmonary disease) ([place])   . Pneumonia 2/?01/2014    "wouldn't get better; hospitalized 04/21/2013)  . Arthritis     "minor in my right hand" (04/21/2013)  . History of radiation therapy 05/03/13-05/18/13    chest & whole brain  . Cancer ([place])     lung ca dx'd 03/2013  . Colon polyps     hyperplastic  . Right arm pain 08/21/2014    PAST SURGICAL HISTORY: Past Surgical History  Procedure Laterality Date  . Inguinal hernia repair Right ~ 2005  . Finger surgery Left ~ 1989    "crushed end of my middle finger off"   . Video bronchoscopy Bilateral 04/26/2013    Procedure: VIDEO BRONCHOSCOPY WITH FLUORO;  Surgeon: Wilhelmina Mcardle, MD;  Location: [facility] ENDOSCOPY;  Service: Cardiopulmonary;  Laterality: Bilateral;     FAMILY HISTORY: family history includes Breast cancer in his sister; Diabetes in his father; Emphysema in his mother.   SOCIAL HISTORY:  reports that he quit smoking about 3 years ago. His smoking use included Cigarettes. He has a 94 pack-year smoking history. He has  never used smokeless tobacco. He reports that he does not drink alcohol or use illicit drugs.   ALLERGIES: Decadron and Augmentin   MEDICATIONS:  Current Facility-Administered Medications  Medication Dose Route Frequency Provider Last Rate Last Dose  . 0.9 %  sodium chloride infusion   Intravenous Continuous Nishant Dhungel, MD 100 mL/hr at 03/27/15 0508    . acetaminophen (TYLENOL) tablet 650 mg  650 mg Oral Q6H PRN Nishant Dhungel, MD       Or  . acetaminophen (TYLENOL) suppository 650  mg  650 mg Rectal Q6H PRN Nishant Dhungel, MD      . albuterol (PROVENTIL) (2.5 MG/3ML) 0.083% nebulizer solution 3 mL  3 mL Inhalation Q4H PRN Nishant Dhungel, MD      . chlorpheniramine-HYDROcodone (TUSSIONEX) 10-8 MG/5ML suspension 5 mL  5 mL Oral Q12H PRN Nishant Dhungel, MD      . dexamethasone (DECADRON) tablet 4 mg  4 mg Oral 4 times per day Nishant Dhungel, MD   4 mg at 03/27/15 1213  . dextromethorphan-guaiFENesin (MUCINEX DM) 30-600 MG per 12 hr tablet 1 tablet  1 tablet Oral BID Nishant Dhungel, MD   1 tablet at 03/27/15 0932  . docusate sodium (COLACE) capsule 300 mg  300 mg Oral BID PRN Nishant Dhungel, MD      . insulin aspart (novoLOG) injection 0-9 Units  0-9 Units Subcutaneous TID WC Nishant Dhungel, MD   1 Units at 03/27/15 1213  . LORazepam (ATIVAN) tablet 0.5 mg  0.5 mg Oral Daily PRN Nishant Dhungel, MD      . magnesium hydroxide (MILK OF MAGNESIA) suspension 5 mL  5 mL Oral Daily PRN Nishant Dhungel, MD      . mirtazapine (REMERON) tablet 30 mg  30 mg Oral QHS Nishant Dhungel, MD   30 mg at 03/26/15 2113  . morphine (MS CONTIN) 12 hr tablet 30 mg  30 mg Oral Q12H Nishant Dhungel, MD   30 mg at 03/27/15 0932  . multivitamin with minerals tablet 1 tablet  1 tablet Oral Daily Nishant Dhungel, MD   1 tablet at 03/27/15 0932  . ondansetron (ZOFRAN) tablet 8 mg  8 mg Oral Q8H PRN Nishant Dhungel, MD      . oxyCODONE (Oxy IR/ROXICODONE) immediate release tablet 5 mg  5 mg Oral Q6H PRN Nishant Dhungel, MD      . pantoprazole (PROTONIX) EC tablet 40 mg  40 mg Oral Daily Nishant Dhungel, MD   40 mg at 03/27/15 0932  . polyethylene glycol (MIRALAX / GLYCOLAX) packet 17 g  17 g Oral Daily Minda Ditto, RPH   17 g at 03/27/15 0932  . prochlorperazine (COMPAZINE) tablet 10 mg  10 mg Oral Q6H PRN Nishant Dhungel, MD      . rivaroxaban (XARELTO) tablet 20 mg  20 mg Oral Daily Nishant Dhungel, MD   20 mg at 03/27/15 0932  . sodium chloride flush (NS) 0.9 % injection 10-40 mL  10-40 mL  Intracatheter PRN Nishant Dhungel, MD   10 mL at 03/27/15 0548   Facility-Administered Medications Ordered in Other Encounters  Medication Dose Route Frequency Provider Last Rate Last Dose  . 0.9 %  sodium chloride infusion  1,000 mL Intravenous Once Concha Norway, MD      . 0.9 %  sodium chloride infusion   Intravenous Once Concha Norway, MD         REVIEW OF SYSTEMS: On review of systems, the patient states  that he has been struggling  since his original diagnosis fatigue, weakness, and states that he pretty much sleep some lives in the living room in her recliner chair. He has been unable to get out and walk in the yard. His wife states that she is hesitant to let him be alone because of her concerns with him following. He recall lies on a walker at home to get out. He states in the last 2 weeks his weakness has progressively become worse and he has been unable to walk even a few steps over the past few days. He has also noticed increasing episodes of urinary incontinence and urinary urgency. He denies any fevers at home or chills. He has not been eating well and the patient's wife states that he continues to have poor nutritional intake. He denies chest pain or shortness of breath at this time. No other complaints or verbalized.    PHYSICAL EXAM:  height is '5\' 7"'$  (1.702 m) and weight is 107 lb 3.2 oz (48.626 kg). His oral temperature is 97.9 F (36.6 C). His blood pressure is 110/65 and his pulse is 69. His respiration is 18 and oxygen saturation is 93%.   Pain scale 0/10 In general this is cachectic and chronically ill-appearing Caucasian male in no acute distress. He is alert and oriented times for appropriate throughout the examination. Cardiopulmonary assessment reveals normal effort without evidence of distress. The remainder the exam is deferred.  ECOG 3  0 - Asymptomatic (Fully active, able to carry on all predisease activities without restriction)  1 - Symptomatic but completely  ambulatory (Restricted in physically strenuous activity but ambulatory and able to carry out work of a light or sedentary nature. For example, light housework, office work)  2 - Symptomatic, <50% in bed during the day (Ambulatory and capable of all self care but unable to carry out any work activities. Up and about more than 50% of waking hours)  3 - Symptomatic, >50% in bed, but not bedbound (Capable of only limited self-care, confined to bed or chair 50% or more of waking hours)  4 - Bedbound (Completely disabled. Cannot carry on any self-care. Totally confined to bed or chair)  5 - Death   Eustace Pen MM, Creech RH, Tormey DC, et al. [phone]). "Toxicity and response criteria of the [facility]". [place] Oncol. 5 (6): 649-55    LABORATORY DATA:  Lab Results  Component Value Date   WBC 6.2 03/27/2015   HGB 10.1* 03/27/2015   HCT 31.0* 03/27/2015   MCV 101.0* 03/27/2015   PLT 146* 03/27/2015   Lab Results  Component Value Date   NA 135 03/26/2015   K 4.1 03/26/2015   CL 99* 03/26/2015   CO2 29 03/26/2015   Lab Results  Component Value Date   ALT 9* 03/26/2015   AST 14* 03/26/2015   ALKPHOS 95 03/26/2015   BILITOT 0.7 03/26/2015      RADIOGRAPHY: Dg Chest 2 View  03/26/2015  CLINICAL DATA:  65 year old 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 EXAM: CHEST  2 VIEW COMPARISON:  CT chest 02/07/2015 and earlier. Chest x-rays 09/01/2014 and earlier. FINDINGS: Cardiac silhouette normal in size, unchanged. No evidence of significant hilar lymphadenopathy currently. Emphysematous changes throughout both lungs as noted previously. Airspace consolidation throughout the right lower lobe, not significantly changed dating back to at least late 2015. Discrete oval opacity in the right perihilar region, likely  the superior segment right lower lobe, which likely  corresponds to the dense airspace consolidation identified on the most recent CT. No new abnormalities. Right jugular Port-A-Cath tip projects over the lower SVC, right jugular Port-A-Cath tip in the lower SVC at or near the cavoatrial junction, unchanged. IMPRESSION: 1. Chronic airspace opacities throughout the right lower lobe dating back to late 2015. 2. Discrete oval opacity in the right perihilar region corresponds to dense airspace consolidation in the superior segment right lower lobe identified on the chest CT 6 weeks ago. 3. No new/acute pulmonary abnormalities. Electronically Signed   By: Evangeline Dakin M.D.   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 EXAM: CT CERVICAL SPINE WITHOUT CONTRAST TECHNIQUE: Multidetector CT imaging of the cervical spine was performed without intravenous contrast. Multiplanar CT image reconstructions were also generated. COMPARISON:  No prior cervical spine imaging.  PET-CT 03/13/2014. FINDINGS: No fractures identified involving the cervical spine. No evidence of osseous metastatic disease. Sagittal reconstructed images demonstrate anatomic alignment. Mild disc space narrowing and endplate hypertrophic changes at C6-7. Remaining disc spaces well preserved. Facet joints intact throughout without significant degenerative change. No spinal stenosis. Soft tissue window images demonstrate no frank disc protrusions. Coronal reformatted images demonstrate an intact craniocervical junction, intact C1-C2 articulation with degenerative changes, intact dens and intact lateral masses. No significant bony foraminal stenoses at any cervical level. IMPRESSION: Mild degenerative disc disease and spondylosis at C6-7 without associated disc protrusion or extrusion. No significant  abnormality otherwise. Specifically, no evidence of cervical spine metastatic disease. Electronically Signed   By: Evangeline Dakin M.D.   On: 03/26/2015 16:32   Ct Lumbar Spine Wo Contrast  03/26/2015  CLINICAL DATA:  Lung cancer with brain metastasis. Leg weakness. Headaches. Back pain. EXAM: CT LUMBAR SPINE WITHOUT CONTRAST TECHNIQUE: Multidetector CT imaging of the lumbar spine was performed without intravenous contrast administration. Multiplanar CT image reconstructions were also generated. COMPARISON:  02/07/2015 abdominal pelvic CT. FINDINGS: Soft tissues: Advanced abdominal aortic and pelvic atherosclerosis. No imaged retroperitoneal adenopathy. Left renal fluid density lesion which is likely a cyst. No evidence of dominant intrathecal mass. Mild disc bulging at L3-4, causing mild central canal stenosis.Mild disc bulges are also identified at L4-5 and L5-S1. Bones: Stable left iliac peripherally sclerotic lesion at 1.0 cm has been present back to 06/15/2013 and is favored to be benign. No other focal osseous lesion identified. Maintenance of vertebral body height and alignment. Minimal convex left lumbar spine curvature. IMPRESSION: 1. Lumbar spondylosis, without acute osseous finding. 2. No evidence of metastatic disease to the lumbosacral spine. Please note that CT is of low sensitivity, and the test of choice is pre and post contrast lumbar spine MRI. Electronically Signed   By: Abigail Miyamoto M.D.   On: 03/26/2015 16:36   Mr Jeri Cos HQ Contrast  03/26/2015  CLINICAL DATA:  65 year old male with extensive stage small cell lung cancer. Status post whole brain radiation in 2015. Most recent chemotherapy treatment in November 2016. Nausea, weakness, headaches. Subsequent encounter. EXAM: MRI HEAD WITHOUT AND WITH CONTRAST TECHNIQUE: Multiplanar, multiecho pulse sequences of the brain and surrounding structures were obtained without and with intravenous contrast. CONTRAST:  6m MULTIHANCE GADOBENATE  DIMEGLUMINE 529 MG/ML IV SOLN COMPARISON:  Brain MRI 06/15/2014 and earlier FINDINGS: The area of the anterior  right midbrain with questionable abnormal enhancement on 06/15/2014 today appears normal. However, there are numerous new brain metastases, widespread throughout the bilateral cerebral and cerebellar hemispheres. There is right brainstem involvement at the pons where a round metastasis measures 7 mm (series 14, image 17). Lesion size ranges from 3-4 mm (right caudate nucleus) to 12 mm diameter (medial left thalamus). Occasional dural based lesions are identified (superior left parietal lobe series 13, image 15). At least 30 individual brain metastases are present. (At least 11 in the posterior fossa alone). Some of these are diffusion restricted in keeping with hypercellularity. A left caudate metastasis is mildly hemorrhagic (series 8, image 13). There is associated regional cerebral edema, most pronounced at the left thalamus and right brainstem. There is underlying chronic confluent cerebral white matter T2 and FLAIR hyperintensity from prior whole brain radiation. No acute or significant intracranial hemorrhage at this time. No midline shift. Basilar cisterns remain patent. No ventriculomegaly. Mild generalized cerebral volume loss since 2016. Major intracranial vascular flow voids are stable. No restricted diffusion or evidence of acute infarction. Negative pituitary, cervicomedullary junction, and visualized cervical spinal cord. Visualized bone marrow signal is within normal limits. Visible internal auditory structures appear normal. Mastoids are clear. Paranasal sinuses are clear. Negative orbit and scalp soft tissues. 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 At least 30 small brain metastases ranging from 3-4 mm, up to 12 mm diameter (medial left thalamus). Occasional dural based lesions (superior left parietal  lobe). Relatively mild cerebral edema with no significant intracranial mass effect at this time. Electronically Signed   By: Genevie Ann M.D.   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  PLAN: I spoke with patient and his wife today regarding the findings from his MRI, and the options for whole brain radiotherapy. The patient and his wife stress their concerns with his overall prognosis and what they feel is poor quality of life. With respect radiation, the patient states that based on his previous experience, he would not be interested in receiving radiation again. He verbalizes that he would like to focus on his quality of life, and is not sure that he would want to be resuscitated in the event of cardiac arrest. He would like to discuss this further with his attending and Dr. [name] as well, who will see the patient tomorrow and communicate his thoughts on the patient's prognosis. I will contact the patient's attending is well this afternoon communicate our discussion.   Carola Rhine, PAC

## 2015-03-27 NOTE — Progress Notes (Signed)
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 He was last seen by his oncologist Dr. [name] on 02/12/2015 . Repeat CT scan done around that time showed no evidence of disease progression. He recommended observation with repeat CT scan of the chest abdomen and pelvis in 3 months. Patient also has history of DVT for which she is on Xarelto. Patient presented to the ED since for the past 3-4 weeks she was increasingly weak with difficult ambulation and frequent falls, poor appetite, unsteadiness with off-and-on urinary incontinence. also reported occipital headache and having some confusion. She was hypertensive with systolic blood pressure in the 80s. Admitted to hospitalist service. MRI of the brain with contrast done showed widespread extensive metastases (almost 30 lesions) with relatively less cerebral edema and no mass effect. Oncology and radiation oncology following.  Assessment/Plan: Generalized weakness with frequent falls Secondary to recurrent widespread brain metastases seen on MRI. Also has dehydration, with deconditioning and malnutrition. Continue IV hydration. UA unremarkable. Cortisol normal. CT of the cervical and number spine with contrast  without any bony metastases. Awaiting further recommendations from Dr. [name] and radiation oncology. I started him on Decadron 4 mg every 6 hours. (Listed as allergy with slurred speech and echo from this further with his daughter who informed that he did tolerated Decadron  initially with radiation but later had one episode of slurred speech and was discontinued). Monitor for any side effects while in the hospital. Added sliding scale coverage. -PT and nutrition consult.  Hypotension Continue IV fluids. Currently stable. Cortisol normal.  urinary frequency with incontinence. Appears that he is unable to the bathroom on time due to weakness. Symptoms have been negative in the night. They metastases may be contributed to the symptoms. CT of the lumbar spine negative for metastatic disease.  GERD Continue PPI  COPD with emphysema Continue O2 via nasal cannula. Added albuterol nebulizer as needed. Continue inhaler.  History of DVT and PE On Xarelto  Lower back and Right chest pains Continue home dose oxycodone and added low-dose morphine as needed. Symptoms better today.  Constipation Continue home regimen  Severe protein calorie malnutrition Dietitian consulted  DVT prophylaxis: On Xarelto Diet: Regular  Code Status: Full code  Family Communication: Daughter at bedside  Disposition Plan: Continue inpatient monitoring   Consultants: Dr. Earlie Server Radiation oncology   Procedures: MRI brain with contrast CT cervical and lumbar spine  Antibiotics: None   HPI/Subjective: Seen and examined. Denies further headache . has not been out of bed. Denies further abdominal pain. Back pain better.   Objective: Filed Vitals:   03/26/15 2009 03/27/15 0625  BP: 110/67 110/65  Pulse: 67 69  Temp: 97.7 F (36.5 C) 97.9 F (36.6 C)  Resp: 18 18    Intake/Output Summary (Last 24 hours) at 03/27/15 1240 Last data filed at 03/26/15 2330  Gross per 24 hour  Intake  88.33 ml  Output    200 ml  Net -111.67 ml   Filed Weights   03/26/15 1749  Weight: 48.626 kg (107 lb 3.2 oz)    Exam:   General:  Not in distress, appears fatigued  HEENT: Pallor present, temporal wasting, moist mucosa  Chest: Right-sided Port-A-Cath, clear  bilaterally  CVS: Normal S1 and S2, no murmurs rub or gallop  GI: Soft, nondistended, nontender, bowel sounds present  Musculoskeletal: Warm   CNS: Alert and oriented    Data Reviewed: Basic Metabolic Panel:  Recent Labs Lab 03/26/15 1243  NA 135  K 4.1  CL 99*  CO2 29  GLUCOSE 146*  BUN 12  CREATININE 0.67  CALCIUM 8.5*   Liver Function Tests:  Recent Labs Lab 03/26/15 1243  AST 14*  ALT 9*  ALKPHOS 95  BILITOT 0.7  PROT 6.6  ALBUMIN 2.9*   No results for input(s): LIPASE, AMYLASE in the last 168 hours. No results for input(s): AMMONIA in the last 168 hours. CBC:  Recent Labs Lab 03/26/15 1243 03/27/15 0549  WBC 6.6 6.2  NEUTROABS 5.5  --   HGB 11.2* 10.1*  HCT 33.7* 31.0*  MCV 101.2* 101.0*  PLT 161 146*   Cardiac Enzymes: No results for input(s): CKTOTAL, CKMB, CKMBINDEX, TROPONINI in the last 168 hours. BNP (last 3 results)  Recent Labs  09/01/14 1313  BNP 46.9    ProBNP (last 3 results) No results for input(s): PROBNP in the last 8760 hours.  CBG:  Recent Labs Lab 03/27/15 1153  GLUCAP 140*    No results found for this or any previous visit (from the past 240 hour(s)).   Studies: Dg Chest 2 View  03/26/2015  CLINICAL DATA:  65 year old 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 EXAM: CHEST  2 VIEW COMPARISON:  CT chest 02/07/2015 and earlier. Chest x-rays 09/01/2014 and earlier. FINDINGS: Cardiac silhouette normal in size, unchanged. No evidence of significant hilar lymphadenopathy currently. Emphysematous changes throughout both lungs as noted previously. Airspace consolidation throughout the right lower lobe, not significantly changed dating back to at least late 2015. Discrete oval opacity in the right perihilar region, likely the superior segment right lower lobe, which likely corresponds to the dense airspace consolidation  identified on the most recent CT. No new abnormalities. Right jugular Port-A-Cath tip projects over the lower SVC, right jugular Port-A-Cath tip in the lower SVC at or near the cavoatrial junction, unchanged. IMPRESSION: 1. Chronic airspace opacities throughout the right lower lobe dating back to late 2015. 2. Discrete oval opacity in the right perihilar region corresponds to dense airspace consolidation in the superior segment right lower lobe identified on the chest CT 6 weeks ago. 3. No new/acute pulmonary abnormalities. Electronically Signed   By: Evangeline Dakin M.D.   On: 03/26/2015 13:41   Ct Cervical Spine Wo Contrast  03/26/2015  CLINICAL DATA:  64 year old with current history of small cell carcinoma of the lung of the right lower lobe metastatic to brain, presenting with acute bilateral lower extremity weakness. EXAM: CT CERVICAL SPINE WITHOUT CONTRAST TECHNIQUE: Multidetector CT imaging of the cervical spine was performed without intravenous contrast. Multiplanar CT image reconstructions were also generated. COMPARISON:  No prior cervical spine imaging.  PET-CT 03/13/2014. FINDINGS: No fractures identified involving the cervical spine. No evidence of osseous metastatic disease. Sagittal reconstructed images demonstrate anatomic alignment. Mild disc space narrowing and endplate hypertrophic changes at C6-7. Remaining disc spaces well preserved. Facet joints intact throughout without significant degenerative change. No spinal stenosis. Soft tissue window images demonstrate no frank disc protrusions. Coronal reformatted images demonstrate an intact craniocervical junction, intact C1-C2 articulation with degenerative changes, intact  dens and intact lateral masses. No significant bony foraminal stenoses at any cervical level. IMPRESSION: Mild degenerative disc disease and spondylosis at C6-7 without associated disc protrusion or extrusion. No significant abnormality otherwise. Specifically, no evidence of  cervical spine metastatic disease. Electronically Signed   By: Evangeline Dakin M.D.   On: 03/26/2015 16:32   Ct Lumbar Spine Wo Contrast  03/26/2015  CLINICAL DATA:  Lung cancer with brain metastasis. Leg weakness. Headaches. Back pain. EXAM: CT LUMBAR SPINE WITHOUT CONTRAST TECHNIQUE: Multidetector CT imaging of the lumbar spine was performed without intravenous contrast administration. Multiplanar CT image reconstructions were also generated. COMPARISON:  02/07/2015 abdominal pelvic CT. FINDINGS: Soft tissues: Advanced abdominal aortic and pelvic atherosclerosis. No imaged retroperitoneal adenopathy. Left renal fluid density lesion which is likely a cyst. No evidence of dominant intrathecal mass. Mild disc bulging at L3-4, causing mild central canal stenosis.Mild disc bulges are also identified at L4-5 and L5-S1. Bones: Stable left iliac peripherally sclerotic lesion at 1.0 cm has been present back to 06/15/2013 and is favored to be benign. No other focal osseous lesion identified. Maintenance of vertebral body height and alignment. Minimal convex left lumbar spine curvature. IMPRESSION: 1. Lumbar spondylosis, without acute osseous finding. 2. No evidence of metastatic disease to the lumbosacral spine. Please note that CT is of low sensitivity, and the test of choice is pre and post contrast lumbar spine MRI. Electronically Signed   By: Abigail Miyamoto M.D.   On: 03/26/2015 16:36   Mr Jeri Cos QI Contrast  03/26/2015  CLINICAL DATA:  65 year old male with extensive stage small cell lung cancer. Status post whole brain radiation in 2015. Most recent chemotherapy treatment in November 2016. Nausea, weakness, headaches. Subsequent encounter. EXAM: MRI HEAD WITHOUT AND WITH CONTRAST TECHNIQUE: Multiplanar, multiecho pulse sequences of the brain and surrounding structures were obtained without and with intravenous contrast. CONTRAST:  23m MULTIHANCE GADOBENATE DIMEGLUMINE 529 MG/ML IV SOLN COMPARISON:  Brain MRI  06/15/2014 and earlier FINDINGS: The area of the anterior right midbrain with questionable abnormal enhancement on 06/15/2014 today appears normal. However, there are numerous new brain metastases, widespread throughout the bilateral cerebral and cerebellar hemispheres. There is right brainstem involvement at the pons where a round metastasis measures 7 mm (series 14, image 17). Lesion size ranges from 3-4 mm (right caudate nucleus) to 12 mm diameter (medial left thalamus). Occasional dural based lesions are identified (superior left parietal lobe series 13, image 15). At least 30 individual brain metastases are present. (At least 11 in the posterior fossa alone). Some of these are diffusion restricted in keeping with hypercellularity. A left caudate metastasis is mildly hemorrhagic (series 8, image 13). There is associated regional cerebral edema, most pronounced at the left thalamus and right brainstem. There is underlying chronic confluent cerebral white matter T2 and FLAIR hyperintensity from prior whole brain radiation. No acute or significant intracranial hemorrhage at this time. No midline shift. Basilar cisterns remain patent. No ventriculomegaly. Mild generalized cerebral volume loss since 2016. Major intracranial vascular flow voids are stable. No restricted diffusion or evidence of acute infarction. Negative pituitary, cervicomedullary junction, and visualized cervical spinal cord. Visualized bone marrow signal is within normal limits. Visible internal auditory structures appear normal. Mastoids are clear. Paranasal sinuses are clear. Negative orbit and scalp soft tissues. 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 At least 30 small brain metastases ranging from 3-4 mm, up to 12 mm diameter (medial left thalamus).  Occasional dural based lesions (superior left parietal lobe). Relatively mild cerebral edema with no significant  intracranial mass effect at this time. Electronically Signed   By: Genevie Ann M.D.   On: 03/26/2015 17:34    Scheduled Meds: . dexamethasone  4 mg Oral 4 times per day  . dextromethorphan-guaiFENesin  1 tablet Oral BID  . insulin aspart  0-9 Units Subcutaneous TID WC  . mirtazapine  30 mg Oral QHS  . morphine  30 mg Oral Q12H  . multivitamin with minerals  1 tablet Oral Daily  . pantoprazole  40 mg Oral Daily  . polyethylene glycol  17 g Oral Daily  . rivaroxaban  20 mg Oral Daily   Continuous Infusions: . sodium chloride 100 mL/hr at 03/27/15 0508      Time spent: 25 minutes    Laylonie Marzec  [facility] Pager [phone]. If 7PM-7AM, please contact night-coverage at [email or web address], password [facility] 03/27/2015, 12:40 PM  LOS: 1 day

## 2015-03-27 NOTE — Progress Notes (Signed)
Initial Nutrition Assessment  DOCUMENTATION CODES:   Severe malnutrition in context of chronic illness  INTERVENTION:  -Pt declined ONS supplements at this time -Continue to monitor for needs   NUTRITION DIAGNOSIS:   Malnutrition related to chronic illness as evidenced by severe depletion of muscle mass, severe depletion of body fat.  GOAL:   Patient will meet greater than or equal to 90% of their needs  MONITOR:   PO intake, Supplement acceptance, I & O's, Labs  REASON FOR ASSESSMENT:   Consult Assessment of nutrition requirement/status  ASSESSMENT:   65 year old male with history of COPD (emphysema), small cell lung cancer with brain metastases diagnosed in March 2015 status post 5 cycles of carboplatin and etoposide from 05/18/2013-10/15/2013 with whole-body radiation and palliative chest radiation  Spoke with pt briefly at bedside. Pt was not interested in speaking to me. He stated that food does not taste very good to him and thus he is trying to experiment. I asked him if there were any specific foods he may like and he said "I told you, I have to experiment." Pt's PO intake has been very poor. This is evident by his physical exam.  Nutrition-Focused physical exam completed. Findings are severe fat depletion, severe muscle depletion, and no edema.   Per PA note, Pt is s/p Radiation therapy 30 Gy to R chest, 30 Gy to brain March 2015. He was NED until Dec 2016 when he was found to have R L lobe and R U Lobe infiltrates with new hepatic met. Pt does not want to undergo radiation again and is concerned with quality of life at this point in time. Per PA note, pt will discuss goals of care with Dr. [name] and attending physician per his prognosis.  Labs and Medications reviewed.  Diet Order:  Diet regular Room service appropriate?: Yes; Fluid consistency:: Thin  Skin:  Reviewed, no issues  Last BM:  1/28  Height:   Ht Readings from Last 1 Encounters:  03/26/15 '5\' 7"'   (1.702 m)    Weight:   Wt Readings from Last 1 Encounters:  03/26/15 107 lb 3.2 oz (48.626 kg)    Ideal Body Weight:  67.27 kg  BMI:  Body mass index is 16.79 kg/(m^2).  Estimated Nutritional Needs:   Kcal:  2956-2130 (35-40 cal/kg)  Protein:  63-83 grams (1.3-1.7g/kg)  Fluid:  >/= 1.7L  EDUCATION NEEDS:   No education needs identified at this time  Satira Anis. Adeleigh Barletta, MS, RD LDN After Hours/Weekend Pager [phone]

## 2015-03-27 NOTE — Evaluation (Signed)
Physical Therapy Evaluation Patient Details Name: Ricky Villanueva MRN: [id number] DOB: February 07, 1951 Today's Date: 03/27/2015   History of Present Illness  65 yo male admitted with weakness, falls at home. MRI + recurrent/new mets to brain. Hx of lung cancer with brain mets, COPD, PE, HTN, falls.   Clinical Impression  On eval, pt required Min guard assist for mobility-walked ~150 feet with RW. Pt tolerated activity fairly well. Recommend HHPT follow up it pt/family decide they want it.     Follow Up Recommendations Home health PT;Supervision/Assistance - 24 hour (if pt/family decide they want it)    Equipment Recommendations  None recommended by PT    Recommendations for Other Services       Precautions / Restrictions Precautions Precautions: Fall Restrictions Weight Bearing Restrictions: No      Mobility  Bed Mobility Overal bed mobility: Needs Assistance Bed Mobility: Supine to Sit;Sit to Supine     Supine to sit: Supervision Sit to supine: Supervision   General bed mobility comments: for safety  Transfers Overall transfer level: Needs assistance Equipment used: Rolling walker (2 wheeled) Transfers: Sit to/from Stand Sit to Stand: Min guard         General transfer comment: close guard for safety. VCs hand placement  Ambulation/Gait Ambulation/Gait assistance: Min guard Ambulation Distance (Feet): 150 Feet Assistive device: Rolling walker (2 wheeled) Gait Pattern/deviations: Step-through pattern;Decreased stride length     General Gait Details: close guard for safety.   Stairs            Wheelchair Mobility    Modified Rankin (Stroke Patients Only)       Balance Overall balance assessment: Needs assistance         Standing balance support: Bilateral upper extremity supported;During functional activity Standing balance-Leahy Scale: Fair                               Pertinent Vitals/Pain Pain Assessment: No/denies pain     Home Living Family/patient expects to be discharged to:: Private residence Living Arrangements: Spouse/significant other Available Help at Discharge: Family Type of Home: House Home Access: Stairs to enter   Technical brewer of Steps: 1 Home Layout: One level Home Equipment: Cane - single point;Shower seat;Walker - 4 wheels      Prior Function Level of Independence: Independent with assistive device(s)         Comments: ambulatory with walker     Hand Dominance        Extremity/Trunk Assessment   Upper Extremity Assessment: Generalized weakness           Lower Extremity Assessment: Generalized weakness      Cervical / Trunk Assessment: Normal  Communication   Communication: No difficulties  Cognition Arousal/Alertness: Awake/alert Behavior During Therapy: WFL for tasks assessed/performed Overall Cognitive Status: Within Functional Limits for tasks assessed                      General Comments      Exercises        Assessment/Plan    PT Assessment Patient needs continued PT services  PT Diagnosis Difficulty walking;Generalized weakness   PT Problem List Decreased strength;Decreased activity tolerance;Decreased balance;Decreased mobility;Decreased knowledge of use of DME  PT Treatment Interventions DME instruction;Gait training;Functional mobility training;Therapeutic activities;Patient/family education;Balance training;Therapeutic exercise   PT Goals (Current goals can be found in the Care Plan section) Acute Rehab PT Goals Patient Stated Goal: none  stated PT Goal Formulation: With patient/family Time For Goal Achievement: 04/10/15 Potential to Achieve Goals: Fair    Frequency Min 3X/week   Barriers to discharge        Co-evaluation               End of Session   Activity Tolerance: Patient tolerated treatment well Patient left: in bed;with call bell/phone within reach;with bed alarm set;with family/visitor  present           Time: 0301-3143 PT Time Calculation (min) (ACUTE ONLY): 18 min   Charges:   PT Evaluation $PT Eval Moderate Complexity: 1 Procedure     PT G Codes:        Weston Anna, MPT Pager: [phone]

## 2015-03-27 NOTE — Progress Notes (Signed)
Utilization review completed.  

## 2015-03-28 DIAGNOSIS — C3491 Malignant neoplasm of unspecified part of right bronchus or lung: Secondary | ICD-10-CM

## 2015-03-28 DIAGNOSIS — I82409 Acute embolism and thrombosis of unspecified deep veins of unspecified lower extremity: Secondary | ICD-10-CM | POA: Diagnosis present

## 2015-03-28 DIAGNOSIS — I824Y9 Acute embolism and thrombosis of unspecified deep veins of unspecified proximal lower extremity: Secondary | ICD-10-CM

## 2015-03-28 DIAGNOSIS — K219 Gastro-esophageal reflux disease without esophagitis: Secondary | ICD-10-CM

## 2015-03-28 DIAGNOSIS — Z7901 Long term (current) use of anticoagulants: Secondary | ICD-10-CM

## 2015-03-28 LAB — URINE CULTURE

## 2015-03-28 LAB — GLUCOSE, CAPILLARY
GLUCOSE-CAPILLARY: 129 mg/dL — AB (ref 65–99)
GLUCOSE-CAPILLARY: 103 mg/dL — AB (ref 65–99)
Glucose-Capillary: 117 mg/dL — ABNORMAL HIGH (ref 65–99)

## 2015-03-28 NOTE — Progress Notes (Signed)
[facility] Progress Note  Patient: Ricky Villanueva DDU:[id number]   PCP: Orpah Melter, MD DOB: 10-14-1950   DOA: 03/26/2015   DOS: 03/28/2015   Date of Service: the patient was seen and examined on 03/28/2015  Subjective: Last time patient had an event where he was confused and wanted to walk out the room. Morning the patient does not have any complaints of dizziness headache nausea vomiting abdominal pain or shortness of breath. Nutrition: Able to tolerate oral diet Activity: Ambulating in the room Last BM: 03/27/2015  Assessment and Plan: 1. 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 MRI shows multiple brain metastasis and the patient has currently decided to go for palliative care approach without any active chemotherapy or radiation. Palliative care has been consulted who will be following up with the patient. Continuing Decadron at present no evidence of side effects reported.  2. History of DVT with long-term anticoagulation. Continue Xarelto.  3. Severe protein calorie malnutrition. Continue nutritional supplementation, appreciate dietitian consultation.  4. Generalized weakness with frequent falls. Most like secondary to widespread brain metastasis as well as dehydration and deconditioning. We'll stop the IV fluids at present. Continuing current pain management as well as psychotropic medication. Awaiting recommendations from palliative care.  5. COPD Doesn't appear to be in acute exacerbation. Continue home inhaler.  DVT Prophylaxis: therapeutic anticoagulation. Nutrition: Regular diet Advance goals of care discussion: Full code  Brief Summary of Hospitalization:  HPI: As per the H and P dictated on admission, "65 year old male with history of COPD (emphysema), small cell lung cancer with brain metastases diagnosed in March 2015 status post 5 cycles of carboplatin and etoposide from 05/18/2013-10/15/2013 with  whole-body radiation and palliative chest radiation from 05/03/2013-05/18/2013 followed by systemic chemotherapy with carboplatin and etoposide (completed 6 cycles, last dose on 01/08/2015). He was last seen by his oncologist Dr. [name] on 02/12/2015 . Repeat CT scan done around that time showed no evidence of disease progression. He recommended observation with repeat CT scan of the chest abdomen and pelvis in 3 months. Patient also has history of DVT for which she is on Xarelto. For the past 3-4 weeks patient has been increasingly weak when he feels tired after walking a few distance and feels like his legs are trending and has to take rest before walking again. For the past 2 weeks this has progressed and he feels increasingly weak, with poor appetite, unsteady gait with few falls at home without sustaining any injury. Over the past 3-4 days this has progressed and has had significant difficulty walking steadily. He also is having difficulty making to the bathroom to urinate and has had episodes of incontinence before he is able to make it to the bathroom. This normally does not happen during the day when he is more prepared and gets help from his wife to get to the bathroom on time. He is also having some off-and-on occipital headaches without any blurred vision, nausea or vomiting. Denies any fevers or chills. He feels extremely weak overall with poor appetite. Denies any diarrhea recent travel or being on any antibiotics. Patient reports some right-sided chest pain with dyspnea on exertion. Denies any palpitations, orthopnea or PND. For the past week he is also having pain in his upper and lower back worsened with movement. He reports some tingling in the distribution of his hands that started after chemotherapy and has unchanged. According to his wife patient also has been confused off and  on for the past 2 days.  In the ED patient was found to have low blood pressure of 88/69 mmHg. O2 sat were 88% on  room air improved on 2 L via nasal cannula. Chemistry showed WBC of 6.6, hemoglobin 11.2, platelets of 161. Chemistry was unremarkable except glucose of 146. Lactic acid was negative. A chest x-ray was done which was chronic if this opacity throughout right lower lobe. Discrete oval opacity in the right perihilar lesion corresponding to dense airspace consolidation in the superior segment of right lower lobe seen on chest CT 6 weeks back. No new abnormality. Patient's oncologist Dr. Earlie Server was consulted by ED physician and given his new's and worsening symptoms recommended admission to hospitalist service with need for evaluation. "  Daily update, Procedures: 03/16/2015, MRI brain without contrast shows multiple recurrent metastatic lesions Consultants: Radiation oncology, medical oncology, palliative care Antibiotics: Anti-infectives    None       Family Communication: family was present at bedside, at the time of interview.  Opportunity was given to ask question and all questions were answered satisfactorily.   Disposition:  Expected discharge date: 03/29/2015 Barriers to safe discharge: Palliative care consultation   Intake/Output Summary (Last 24 hours) at 03/28/15 1202 Last data filed at 03/27/15 1800  Gross per 24 hour  Intake 1286.67 ml  Output      0 ml  Net 1286.67 ml   Filed Weights   03/26/15 1749  Weight: 48.626 kg (107 lb 3.2 oz)    Objective: Physical Exam: Filed Vitals:   03/26/15 2009 03/27/15 0625 03/27/15 2111 03/28/15 0530  BP: 110/67 110/65 92/62 110/73  Pulse: 67 69 69 76  Temp: 97.7 F (36.5 C) 97.9 F (36.6 C) 98.1 F (36.7 C) 98 F (36.7 C)  TempSrc: Oral Oral Oral Oral  Resp: '18 18 18 18  '$ Height:      Weight:      SpO2: 100% 93% 90% 90%     General: Appear in mild distress, no Rash; Oral Mucosa moist. Cardiovascular: S1 and S2 Present, no Murmur, no JVD Respiratory: Bilateral Air entry present and Clear to Auscultation, no Crackles, no  wheezes Abdomen: Bowel Sound present, Soft and no tenderness Extremities: no Pedal edema, no calf tenderness Neurology: Grossly no focal neuro deficit.  Data Reviewed: CBC:  Recent Labs Lab 03/26/15 1243 03/27/15 0549  WBC 6.6 6.2  NEUTROABS 5.5  --   HGB 11.2* 10.1*  HCT 33.7* 31.0*  MCV 101.2* 101.0*  PLT 161 748*   Basic Metabolic Panel:  Recent Labs Lab 03/26/15 1243  NA 135  K 4.1  CL 99*  CO2 29  GLUCOSE 146*  BUN 12  CREATININE 0.67  CALCIUM 8.5*   Liver Function Tests:  Recent Labs Lab 03/26/15 1243  AST 14*  ALT 9*  ALKPHOS 95  BILITOT 0.7  PROT 6.6  ALBUMIN 2.9*   No results for input(s): LIPASE, AMYLASE in the last 168 hours. No results for input(s): AMMONIA in the last 168 hours.  Cardiac Enzymes: No results for input(s): CKTOTAL, CKMB, CKMBINDEX, TROPONINI in the last 168 hours.  BNP (last 3 results)  Recent Labs  09/01/14 1313  BNP 46.9    CBG:  Recent Labs Lab 03/27/15 1153 03/27/15 1707 03/27/15 2108 03/28/15 0759  GLUCAP 140* 205* 144* 129*    No results found for this or any previous visit (from the past 240 hour(s)).   Studies: No results found.   Scheduled Meds: . dexamethasone  4  mg Oral 4 times per day  . dextromethorphan-guaiFENesin  1 tablet Oral BID  . insulin aspart  0-9 Units Subcutaneous TID WC  . mirtazapine  30 mg Oral QHS  . morphine  30 mg Oral Q12H  . multivitamin with minerals  1 tablet Oral Daily  . pantoprazole  40 mg Oral Daily  . polyethylene glycol  17 g Oral Daily  . rivaroxaban  20 mg Oral Daily   Continuous Infusions: . sodium chloride 100 mL/hr at 03/28/15 1000   PRN Meds: acetaminophen **OR** acetaminophen, albuterol, chlorpheniramine-HYDROcodone, docusate sodium, LORazepam, magnesium hydroxide, ondansetron, oxyCODONE, prochlorperazine, sodium chloride flush  Time spent: 30 minutes  Author: Berle Mull, MD [facility] Pager: [phone] 03/28/2015 12:02 PM  If 7PM-7AM,  please contact night-coverage at [email or web address], password [facility]

## 2015-03-28 NOTE — Consult Note (Signed)
Consultation Note Date: 03/28/2015   Patient Name: Ricky Villanueva  DOB: 01-23-1951  MRN: [id number]  Age / Sex: 65 y.o., male  PCP: Orpah Melter, MD Referring Physician: Lavina Hamman, MD  Reason for Consultation: Establishing goals of care    Clinical Assessment/Narrative: I met today with Mr. Purohit and his wife [name]. They are tearful and are encouraging each other but clearly tell me that their goal is for comfort and that they have been given good time with an aggressive cancer and now they will "continue to trust in the [place]." He is not eating/drinking much at all - none really today. Has had some confusion especially at night. Seems somewhat anxious and fidgety lying in bed. They tell me that they would like to go to hospice facility in [place] for end of life care. Dr. [name] has recommended hospice facility. They also confirm DNR. We discussed hospice philosophy and Mr. Quesnel just wants to make sure that this is "my last move." Emotional support provided. Hopeful for transition to hospice facility soon.   Contacts/Participants in Discussion: Primary Decision Maker: Self   Relationship to Patient Rayshaun Needle wife  SUMMARY OF RECOMMENDATIONS - DNR - Comfort care  Code Status/Advance Care Planning: DNR   Symptom Management:   Recommend continuing decadron if tolerating for brain mets.   Palliative Prophylaxis:   Delirium Protocol and Frequent Pain Assessment  Additional Recommendations (Limitations, Scope, Preferences):  Full Comfort Care  Psycho-social/Spiritual:  Support System: Strong Desire for further Chaplaincy support:yes Additional Recommendations: Caregiving  Support/Resources, Education on Hospice and Grief/Bereavement Support  Prognosis: < 4 weeks  Discharge Planning: Hospice facility   Chief Complaint/ Primary Diagnoses: Present on Admission:  . Anorexia . Emphysema  lung ([place]) . GERD (gastroesophageal reflux disease) . Protein-calorie malnutrition, severe ([place]) . Small cell carcinoma of lung ([place]) . Brain metastases ([place]) . DVT (deep venous thrombosis) ([place])  I have reviewed the medical record, interviewed the patient and family, and examined the patient. The following aspects are pertinent.  Past Medical History  Diagnosis Date  . Hyperlipidemia   . Hypertension   . GERD (gastroesophageal reflux disease)   . Respiratory failure with hypoxia ([place]) 04/21/2013    secondary to pneumonia/notes 04/21/2013  . Pulmonary embolism ([place])     "got one in there now" (04/21/2013)  . COPD (chronic obstructive pulmonary disease) ([place])   . Pneumonia 2/?01/2014    "wouldn't get better; hospitalized 04/21/2013)  . Arthritis     "minor in my right hand" (04/21/2013)  . History of radiation therapy 05/03/13-05/18/13    chest & whole brain  . Cancer ([place])     lung ca dx'd 03/2013  . Colon polyps     hyperplastic  . Right arm pain 08/21/2014   Social History   Social History  . Marital Status: Married    Spouse Name: N/A  . Number of Children: 1  . Years of Education: N/A   Occupational History  .  [employer]   Social History Main Topics  . Smoking status: Former Smoker -- 2.00 packs/day for 47 years    Types: Cigarettes    Quit date: 12/25/2011  . Smokeless tobacco: Never Used  . Alcohol Use: No     Comment: 04/21/2013 "used to drink a little bit; very little; nothing in years"  . Drug Use: No  . Sexual Activity: Not Currently   Other Topics Concern  . None   Social History Narrative   Family History  Problem  Relation Age of Onset  . Breast cancer Sister   . Emphysema Mother   . Diabetes Father    Scheduled Meds: . dexamethasone  4 mg Oral 4 times per day  . dextromethorphan-guaiFENesin  1 tablet Oral BID  . insulin aspart  0-9 Units Subcutaneous TID WC  . mirtazapine  30 mg Oral QHS  . morphine  30 mg Oral Q12H  . multivitamin with minerals   1 tablet Oral Daily  . pantoprazole  40 mg Oral Daily  . polyethylene glycol  17 g Oral Daily  . rivaroxaban  20 mg Oral Daily   Continuous Infusions: . sodium chloride 100 mL/hr at 03/28/15 1000   PRN Meds:.acetaminophen **OR** acetaminophen, albuterol, chlorpheniramine-HYDROcodone, docusate sodium, LORazepam, magnesium hydroxide, ondansetron, oxyCODONE, prochlorperazine, sodium chloride flush Medications Prior to Admission:  Prior to Admission medications   Medication Sig Start Date End Date Taking? Authorizing Provider  chlorpheniramine-HYDROcodone (TUSSIONEX) 10-8 MG/5ML SUER TAKE ONE TEASPOONFUL BY MOUTH EVERY 12 HOURS AS NEEDED FOR COUGH 02/27/15  Yes Curt Bears, MD  docusate sodium (COLACE) 100 MG capsule Take 300 mg by mouth 2 (two) times daily as needed for mild constipation.    Yes Historical Provider, MD  Iron-Vitamins (GERITOL PO) Take 1 tablet by mouth daily.   Yes Historical Provider, MD  lidocaine-prilocaine (EMLA) cream Apply to port site one hour before treatment and cover with plastic wrap. 12/25/14  Yes Curt Bears, MD  LORazepam (ATIVAN) 0.5 MG tablet TAKE ONE TABLET BY MOUTH TWICE DAILY AS NEEDED FOR NAUSEA/ANXIETY 03/12/15  Yes Curt Bears, MD  magnesium hydroxide (MILK OF MAGNESIA) 400 MG/5ML suspension Take 5 mLs by mouth daily as needed for mild constipation.   Yes Historical Provider, MD  mirtazapine (REMERON) 30 MG tablet TAKE 1 TABLET AT BEDTIME 02/20/15  Yes Curt Bears, MD  morphine (MS CONTIN) 30 MG 12 hr tablet Take 1 tablet (30 mg total) by mouth every 12 (twelve) hours. 03/12/15  Yes Curt Bears, MD  omeprazole (PRILOSEC) 40 MG capsule TAKE 1 CAPSULE (40 MG TOTAL)  BY MOUTH DAILY. 03/16/15  Yes Curt Bears, MD  ondansetron (ZOFRAN) 8 MG tablet Take 8 mg by mouth every 8 (eight) hours as needed for nausea. Reported on 02/12/2015 09/29/14  Yes Historical Provider, MD  oxyCODONE (OXY IR/ROXICODONE) 5 MG immediate release tablet Take 1 tablet (5  mg total) by mouth every 6 (six) hours as needed for severe pain or breakthrough pain. May take up to ii po q 6rhs prn for severe pain 03/12/15  Yes Curt Bears, MD  OXYGEN Inhale 2 L into the lungs continuous.   Yes Historical Provider, MD  polyethylene glycol powder (MIRALAX) powder Take 17 g by mouth daily. 08/20/13  Yes Jola Schmidt, MD  PROAIR HFA 108 (90 BASE) MCG/ACT inhaler USE 2 INHALATIONS EVERY 6 HOURS AS NEEDED FOR WHEEZING OR SHORTNESS OF BREATH 11/29/14  Yes Curt Bears, MD  prochlorperazine (COMPAZINE) 10 MG tablet Take 1 tablet (10 mg total) by mouth every 6 (six) hours as needed for nausea or vomiting (nausea). 09/04/14  Yes Adrena E Johnson, PA-C  XARELTO 20 MG TABS tablet TAKE 1 TABLET (20 MG TOTAL) BY MOUTH DAILY WITH SUPPER. 11/20/14  Yes Curt Bears, MD  mirtazapine (REMERON) 30 MG tablet Take 1 tablet (30 mg total) by mouth at bedtime. Patient not taking: Reported on 03/26/2015 11/06/14   Adrena E Johnson, PA-C  XARELTO 20 MG TABS tablet TAKE 1 TABLET DAILY WITH SUPPER Patient not taking: Reported on 03/26/2015  02/21/15   Curt Bears, MD   Allergies  Allergen Reactions  . Decadron [Dexamethasone] Other (See Comments)    Slurred speech  . Augmentin [Amoxicillin-Pot Clavulanate] Itching and Rash    rash    Review of Systems  Constitutional: Positive for appetite change.  Neurological: Positive for headaches.  Psychiatric/Behavioral: Positive for confusion.    Physical Exam  Constitutional: He is oriented to person, place, and time. He appears well-developed. He appears cachectic.  HENT:  Head: Normocephalic and atraumatic.  Cardiovascular: Normal rate.   Respiratory: Effort normal. No accessory muscle usage. No tachypnea. No respiratory distress.  GI: Normal appearance.  Neurological: He is alert and oriented to person, place, and time.  Confused intermittently     Vital Signs: BP 101/62 mmHg  Pulse 61  Temp(Src) 98.2 F (36.8 C) (Oral)  Resp 20   Ht '5\' 7"'  (1.702 m)  Wt 48.626 kg (107 lb 3.2 oz)  BMI 16.79 kg/m2  SpO2 93%  SpO2: SpO2: 93 % O2 Device:SpO2: 93 % O2 Flow Rate: .O2 Flow Rate (L/min): 2 L/min  IO: Intake/output summary:  Intake/Output Summary (Last 24 hours) at 03/28/15 1355 Last data filed at 03/27/15 1800  Gross per 24 hour  Intake 1286.67 ml  Output      0 ml  Net 1286.67 ml    LBM: Last BM Date: 03/27/15 Baseline Weight: Weight: 48.626 kg (107 lb 3.2 oz) Most recent weight: Weight: 48.626 kg (107 lb 3.2 oz)      Palliative Assessment/Data:  Flowsheet Rows        Most Recent Value   Intake Tab    Referral Department  Hospitalist   Unit at Time of Referral  Cardiac/Telemetry Unit   Palliative Care Primary Diagnosis  Cancer   Date Notified  03/28/15   Palliative Care Type  New Palliative care   Reason for referral  Clarify Goals of Care   Date of Admission  03/26/15   # of days IP prior to Palliative referral  2   Clinical Assessment    Psychosocial & Spiritual Assessment    Palliative Care Outcomes       Additional Data Reviewed:  CBC:    Component Value Date/Time   WBC 6.2 03/27/2015 0549   WBC 3.9* 02/12/2015 0846   HGB 10.1* 03/27/2015 0549   HGB 11.5* 02/12/2015 0846   HCT 31.0* 03/27/2015 0549   HCT 34.9* 02/12/2015 0846   PLT 146* 03/27/2015 0549   PLT 237 02/12/2015 0846   MCV 101.0* 03/27/2015 0549   MCV 98.5* 02/12/2015 0846   NEUTROABS 5.5 03/26/2015 1243   NEUTROABS 2.5 02/12/2015 0846   LYMPHSABS 0.8 03/26/2015 1243   LYMPHSABS 0.6* 02/12/2015 0846   MONOABS 0.3 03/26/2015 1243   MONOABS 0.8 02/12/2015 0846   EOSABS 0.1 03/26/2015 1243   EOSABS 0.0 02/12/2015 0846   BASOSABS 0.0 03/26/2015 1243   BASOSABS 0.0 02/12/2015 0846   Comprehensive Metabolic Panel:    Component Value Date/Time   NA 135 03/26/2015 1243   NA 139 02/12/2015 0846   K 4.1 03/26/2015 1243   K 3.8 02/12/2015 0846   CL 99* 03/26/2015 1243   CO2 29 03/26/2015 1243   CO2 28 02/12/2015 0846    BUN 12 03/26/2015 1243   BUN 7.0 02/12/2015 0846   CREATININE 0.67 03/26/2015 1243   CREATININE 0.9 02/12/2015 0846   GLUCOSE 146* 03/26/2015 1243   GLUCOSE 127 02/12/2015 0846   CALCIUM 8.5* 03/26/2015 1243   CALCIUM 9.3  02/12/2015 0846   AST 14* 03/26/2015 1243   AST 20 02/12/2015 0846   ALT 9* 03/26/2015 1243   ALT 15 02/12/2015 0846   ALKPHOS 95 03/26/2015 1243   ALKPHOS 98 02/12/2015 0846   BILITOT 0.7 03/26/2015 1243   BILITOT <0.30 02/12/2015 0846   PROT 6.6 03/26/2015 1243   PROT 6.8 02/12/2015 0846   ALBUMIN 2.9* 03/26/2015 1243   ALBUMIN 3.2* 02/12/2015 0846     Time In: 1315 Time Out: 1425 Time Total: 36mn Greater than 50%  of this time was spent counseling and coordinating care related to the above assessment and plan.  Signed by: PPershing Proud NP  APershing Proud NP  20/02/2222 1:55 PM  Please contact Palliative Medicine Team phone at [phone] questions and concerns.

## 2015-03-28 NOTE — Progress Notes (Signed)
CSW received consult from [place], PMT NP that family would like residential hospice placement. First choice: [facility] made referral to [facility], awaiting response back re: bed availability/eligibility.    Raynaldo Opitz, [facility] Clinical Social Worker cell #: [phone]

## 2015-03-28 NOTE — Progress Notes (Signed)
Held AM dose of Decadron per family request, request to speak MD concerning pt increased confusion.

## 2015-03-28 NOTE — Clinical Documentation Improvement (Signed)
Hospitalist  (Query responses must be documented in the current medical record, not on the CDI BPA form.)  Possible Clinical Conditions:  - Chronic Hypoxic Respiratory Failure, including any associated condition(s) or cause(s)  - Other condition  - Unable to clinically determine  Clinical Information/Indicators: "Pt wears 2 L/min O2 at home" documented by ED nurse, Wendall Mola RN, 03/26/15 at 11:56 am "Continue O2 via nasal cannula" documented by Dr. Clementeen Graham 03/27/15 at 12:48 pm Progress Note     Please exercise your independent, professional judgment when responding. A specific answer is not anticipated or expected.   Thank You, Erling Conte  RN BSN CCDS [phone] Health Information Management [place]

## 2015-03-28 NOTE — Progress Notes (Signed)
CSW received call back from [facility] that patient has a bed for tomorrow, Th 2/2. Dr. Posey Pronto aware.   CSW will facilitate discharge in the morning.    Raynaldo Opitz, [facility] Clinical Social Worker cell #: [phone]

## 2015-03-28 NOTE — Progress Notes (Signed)
Subjective: The patient is seen and examined today. His wife and daughter were at the bedside. He was sleeping comfortably this morning. He had a rough night with confusion and agitation. No fever or chills. He has no nausea or vomiting. Recent imaging studies showed innumerable brain metastasis.  Objective: Vital signs in last 24 hours: Temp:  [98 F (36.7 C)-98.1 F (36.7 C)] 98 F (36.7 C) (02/01 0530) Pulse Rate:  [69-76] 76 (02/01 0530) Resp:  [18] 18 (02/01 0530) BP: (92-110)/(62-73) 110/73 mmHg (02/01 0530) SpO2:  [90 %] 90 % (02/01 0530)  Intake/Output from previous day: 01/31 0701 - 02/01 0700 In: 1286.7 [I.V.:1286.7] Out: -  Intake/Output this shift:    General appearance: fatigued, slowed mentation and uncooperative Resp: clear to auscultation bilaterally Cardio: regular rate and rhythm, S1, S2 normal, no murmur, click, rub or gallop GI: soft, non-tender; bowel sounds normal; no masses,  no organomegaly Extremities: extremities normal, atraumatic, no cyanosis or edema  Lab Results:   Recent Labs  03/26/15 1243 03/27/15 0549  WBC 6.6 6.2  HGB 11.2* 10.1*  HCT 33.7* 31.0*  PLT 161 146*   BMET  Recent Labs  03/26/15 1243  NA 135  K 4.1  CL 99*  CO2 29  GLUCOSE 146*  BUN 12  CREATININE 0.67  CALCIUM 8.5*    Studies/Results: Dg Chest 2 View  03/26/2015  CLINICAL DATA:  65 year old 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 EXAM: CHEST  2 VIEW COMPARISON:  CT chest 02/07/2015 and earlier. Chest x-rays 09/01/2014 and earlier. FINDINGS: Cardiac silhouette normal in size, unchanged. No evidence of significant hilar lymphadenopathy currently. Emphysematous changes throughout both lungs as noted previously. Airspace consolidation throughout the right lower lobe, not significantly changed dating back to at least late 2015. Discrete oval opacity in the right  perihilar region, likely the superior segment right lower lobe, which likely corresponds to the dense airspace consolidation identified on the most recent CT. No new abnormalities. Right jugular Port-A-Cath tip projects over the lower SVC, right jugular Port-A-Cath tip in the lower SVC at or near the cavoatrial junction, unchanged. IMPRESSION: 1. Chronic airspace opacities throughout the right lower lobe dating back to late 2015. 2. Discrete oval opacity in the right perihilar region corresponds to dense airspace consolidation in the superior segment right lower lobe identified on the chest CT 6 weeks ago. 3. No new/acute pulmonary abnormalities. Electronically Signed   By: Evangeline Dakin M.D.   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 EXAM: CT CERVICAL SPINE WITHOUT CONTRAST TECHNIQUE: Multidetector CT imaging of the cervical spine was performed without intravenous contrast. Multiplanar CT image reconstructions were also generated. COMPARISON:  No prior cervical spine imaging.  PET-CT 03/13/2014. FINDINGS: No fractures identified involving the cervical spine. No evidence of osseous metastatic disease. Sagittal reconstructed images demonstrate anatomic alignment. Mild disc space narrowing and endplate hypertrophic changes at C6-7. Remaining disc spaces well preserved. Facet joints intact throughout without significant degenerative change. No spinal stenosis. Soft tissue window images demonstrate no frank disc protrusions. Coronal reformatted images demonstrate an intact craniocervical junction, intact C1-C2 articulation with degenerative changes, intact dens and intact lateral masses. No significant bony foraminal stenoses at any cervical level. IMPRESSION:  Mild degenerative disc disease and  spondylosis at C6-7 without associated disc protrusion or extrusion. No significant abnormality otherwise. Specifically, no evidence of cervical spine metastatic disease. Electronically Signed   By: Evangeline Dakin M.D.   On: 03/26/2015 16:32   Ct Lumbar Spine Wo Contrast  03/26/2015  CLINICAL DATA:  Lung cancer with brain metastasis. Leg weakness. Headaches. Back pain. EXAM: CT LUMBAR SPINE WITHOUT CONTRAST TECHNIQUE: Multidetector CT imaging of the lumbar spine was performed without intravenous contrast administration. Multiplanar CT image reconstructions were also generated. COMPARISON:  02/07/2015 abdominal pelvic CT. FINDINGS: Soft tissues: Advanced abdominal aortic and pelvic atherosclerosis. No imaged retroperitoneal adenopathy. Left renal fluid density lesion which is likely a cyst. No evidence of dominant intrathecal mass. Mild disc bulging at L3-4, causing mild central canal stenosis.Mild disc bulges are also identified at L4-5 and L5-S1. Bones: Stable left iliac peripherally sclerotic lesion at 1.0 cm has been present back to 06/15/2013 and is favored to be benign. No other focal osseous lesion identified. Maintenance of vertebral body height and alignment. Minimal convex left lumbar spine curvature. IMPRESSION: 1. Lumbar spondylosis, without acute osseous finding. 2. No evidence of metastatic disease to the lumbosacral spine. Please note that CT is of low sensitivity, and the test of choice is pre and post contrast lumbar spine MRI. Electronically Signed   By: Abigail Miyamoto M.D.   On: 03/26/2015 16:36   Mr Jeri Cos PX Contrast  03/26/2015  CLINICAL DATA:  65 year old male with extensive stage small cell lung cancer. Status post whole brain radiation in 2015. Most recent chemotherapy treatment in November 2016. Nausea, weakness, headaches. Subsequent encounter. EXAM: MRI HEAD WITHOUT AND WITH CONTRAST TECHNIQUE: Multiplanar, multiecho pulse sequences of the brain and surrounding structures were  obtained without and with intravenous contrast. CONTRAST:  29m MULTIHANCE GADOBENATE DIMEGLUMINE 529 MG/ML IV SOLN COMPARISON:  Brain MRI 06/15/2014 and earlier FINDINGS: The area of the anterior right midbrain with questionable abnormal enhancement on 06/15/2014 today appears normal. However, there are numerous new brain metastases, widespread throughout the bilateral cerebral and cerebellar hemispheres. There is right brainstem involvement at the pons where a round metastasis measures 7 mm (series 14, image 17). Lesion size ranges from 3-4 mm (right caudate nucleus) to 12 mm diameter (medial left thalamus). Occasional dural based lesions are identified (superior left parietal lobe series 13, image 15). At least 30 individual brain metastases are present. (At least 11 in the posterior fossa alone). Some of these are diffusion restricted in keeping with hypercellularity. A left caudate metastasis is mildly hemorrhagic (series 8, image 13). There is associated regional cerebral edema, most pronounced at the left thalamus and right brainstem. There is underlying chronic confluent cerebral white matter T2 and FLAIR hyperintensity from prior whole brain radiation. No acute or significant intracranial hemorrhage at this time. No midline shift. Basilar cisterns remain patent. No ventriculomegaly. Mild generalized cerebral volume loss since 2016. Major intracranial vascular flow voids are stable. No restricted diffusion or evidence of acute infarction. Negative pituitary, cervicomedullary junction, and visualized cervical spinal cord. Visualized bone marrow signal is within normal limits. Visible internal auditory structures appear normal. Mastoids are clear. Paranasal sinuses are clear. Negative orbit and scalp soft tissues. 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 At least 30 small brain metastases ranging from 3-4 mm, up to 12 mm  diameter (medial left thalamus). Occasional dural based lesions (superior left parietal lobe). Relatively mild cerebral edema with no  significant intracranial mass effect at this time. Electronically Signed   By: Genevie Ann M.D.   On: 03/26/2015 17:34    Medications: I have reviewed the patient's current medications.  CODE STATUS: No CODE BLUE  Assessment/Plan: This is a very pleasant 65 years old white male with extensive stage small cell lung cancer diagnosed almost 2 years ago status post several chemotherapy regimens and currently on observation. Unfortunately the patient had significant disease progression to the brain was more than 30 brain lesion.  I discussed treatment options with the family including palliative radiotherapy versus palliative care and hospice. The patient indicated to his family that he is not interested in any further treatment and they are considering palliative care at this point. Unfortunately his survival is very short taking into consideration his current disease progression. I strongly recommended for the family to consider transferring the patient to hospice facility as it may be hard for them to deal with his current situation at home. I will consult the palliative care team to discuss with the patient this option. Thank you so much for taking good care of Ricky Villanueva, I will continue to follow up the patient with you and assist in his management on as-needed basis.  LOS: 2 days    Ricky Villanueva K. 03/28/2015

## 2015-03-29 DIAGNOSIS — J9610 Chronic respiratory failure, unspecified whether with hypoxia or hypercapnia: Secondary | ICD-10-CM | POA: Diagnosis present

## 2015-03-29 LAB — GLUCOSE, CAPILLARY
GLUCOSE-CAPILLARY: 128 mg/dL — AB (ref 65–99)
Glucose-Capillary: 103 mg/dL — ABNORMAL HIGH (ref 65–99)

## 2015-03-29 MED ORDER — HEPARIN SOD (PORK) LOCK FLUSH 100 UNIT/ML IV SOLN
500.0000 [IU] | INTRAVENOUS | Status: AC | PRN
  Administered 2015-03-29: 500 [IU]

## 2015-03-29 MED ORDER — DEXAMETHASONE 4 MG PO TABS: 4.0000 mg | ORAL_TABLET | Freq: Every day | ORAL | Status: AC

## 2015-03-29 MED ORDER — OXYCODONE HCL 5 MG PO TABS: 5.0000 mg | ORAL_TABLET | ORAL | Status: AC | PRN

## 2015-03-29 NOTE — Progress Notes (Signed)
PT Cancellation Note  Patient Details Name: MAVERIK FOOT MRN: [id number] DOB: 02/20/1951   Cancelled Treatment:    Reason Eval/Treat Not Completed: Pt is now full comfort care. Plan is for transfer to residential hospice facility. Will sign off.    Weston Anna, MPT Pager: [phone]

## 2015-03-29 NOTE — Discharge Summary (Addendum)
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 Discharge to [facility].   Follow-up Information    Follow up with Orpah Melter, MD.   Specialty:  Family Medicine   Why:  As needed   Contact information:   [place] [address] [phone]       Follow up with Eilleen Kempf., MD.   Specialty:  Oncology   Why:  As needed   Contact information:   [place] [address] [phone]      Diet recommendation: regular diet  Activity: The patient is advised to gradually reintroduce usual activities.  Discharge Condition: fair  History of present illness: As per the H and P dictated on admission, "65 year old male with history of COPD (emphysema), small cell lung cancer with brain metastases diagnosed in March 2015 status post 5 cycles of carboplatin and etoposide from 05/18/2013-10/15/2013 with whole-body radiation and palliative chest radiation from 05/03/2013-05/18/2013 followed by systemic chemotherapy with carboplatin and etoposide (completed 6 cycles, last dose on 01/08/2015). He was last seen by his oncologist Dr. [name] on 02/12/2015 . Repeat CT scan done around that time showed no evidence of disease progression. He recommended observation with repeat CT scan of the chest abdomen and pelvis in 3 months. Patient also has history of DVT for  which she is on Xarelto. For the past 3-4 weeks patient has been increasingly weak when he feels tired after walking a few distance and feels like his legs are trending and has to take rest before walking again. For the past 2 weeks this has progressed and he feels increasingly weak, with poor appetite, unsteady gait with few falls at home without sustaining any injury. Over the past 3-4 days this has progressed and has had significant difficulty walking steadily. He also is having difficulty making to the bathroom to urinate and has had episodes of incontinence before he is able to make it to the bathroom. This normally does not happen during the day when he is more prepared and gets help from his wife to get to the bathroom on time. He is also having some off-and-on occipital headaches without any blurred vision, nausea or vomiting. Denies any fevers or chills. He feels extremely weak overall with poor appetite. Denies any diarrhea recent travel or being on any antibiotics. Patient reports some right-sided chest pain with dyspnea on exertion. Denies any palpitations, orthopnea or PND. For the past week he is also having pain in his upper and lower back worsened with movement. He reports some tingling in the distribution of his hands that started after chemotherapy and has unchanged. According to his wife patient also has been confused off and on for the past 2 days.  In the ED patient was found to have low blood pressure of 88/69 mmHg. O2 sat were 88% on room air improved on 2 L via nasal  cannula. Chemistry showed WBC of 6.6, hemoglobin 11.2, platelets of 161. Chemistry was unremarkable except glucose of 146. Lactic acid was negative. A chest x-ray was done which was chronic if this opacity throughout right lower lobe. Discrete oval opacity in the right perihilar lesion corresponding to dense airspace consolidation in the superior segment of right lower lobe seen on chest CT 6 weeks back. No new  abnormality. Patient's oncologist Dr. Earlie Server was consulted by ED physician and given his new's and worsening symptoms recommended admission to hospitalist service with need for evaluation. "  Hospital Course:  Summary of his active problems in the hospital is as following.  1. 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 MRI shows multiple brain metastasis and the patient has currently decided to go for palliative care approach without any active chemotherapy or radiation. Palliative care has been consulted, appreciate their input in the management Continuing Decadron at present no evidence of side effects reported.  2. History of DVT with long-term anticoagulation. Continue Xarelto.  3. Severe protein calorie malnutrition. Continue nutritional supplementation, appreciate dietitian consultation.  4. Generalized weakness with frequent falls. Most like secondary to widespread brain metastasis as well as dehydration and deconditioning. Patient was hydrated in the hospital Continuing current pain management as well as psychotropic medication  5. COPD Chronic respiratory failure  Doesn't appear to be in acute exacerbation. Continue home inhaler. Continue home oxygen  All other chronic medical condition were stable during the hospitalization.  Patient was seen by physical therapy, and as per the discussion with the palliative care team hospice was arranged by social worker and case manager. On the day of the discharge the patient's mentation remained clear, and no other acute medical condition were reported by patient. the patient was felt safe to be discharge at [facility].  Procedures and Results:  none   Consultations:  Oncology  Palliative care  DISCHARGE MEDICATION: Current Discharge Medication List    START taking these medications   Details  dexamethasone (DECADRON) 4 MG tablet Take 1 tablet (4  mg total) by mouth daily. Qty: 28 tablet, Refills: 0      CONTINUE these medications which have CHANGED   Details  oxyCODONE (OXY IR/ROXICODONE) 5 MG immediate release tablet Take 1 tablet (5 mg total) by mouth every 4 (four) hours as needed for severe pain or breakthrough pain. May take up to ii po q 6rhs prn for severe pain Qty: 30 tablet, Refills: 0   Associated Diagnoses: Midline low back pain without sciatica; Small cell carcinoma of lung, unspecified laterality ([place]); Lung mass; Malignant neoplasm of lung, unspecified laterality, unspecified part of lung ([place])      CONTINUE these medications which have NOT CHANGED   Details  chlorpheniramine-HYDROcodone (TUSSIONEX) 10-8 MG/5ML SUER TAKE ONE TEASPOONFUL BY MOUTH EVERY 12 HOURS AS NEEDED FOR COUGH Qty: 140 mL, Refills: 0   Associated Diagnoses: Small cell carcinoma of lung, left (HCC)    docusate sodium (COLACE) 100 MG capsule Take 300 mg by mouth 2 (two) times daily as needed for mild constipation.     Iron-Vitamins (GERITOL PO) Take 1 tablet by mouth daily.    lidocaine-prilocaine (EMLA) cream Apply to port site one hour before treatment and cover with plastic wrap. Qty: 1 each, Refills: 2   Associated Diagnoses: Port-a-cath in place    LORazepam (ATIVAN) 0.5 MG tablet TAKE ONE TABLET BY MOUTH TWICE DAILY AS NEEDED FOR NAUSEA/ANXIETY Qty: 30 tablet,  Refills: 0    magnesium hydroxide (MILK OF MAGNESIA) 400 MG/5ML suspension Take 5 mLs by mouth daily as needed for mild constipation.   Associated Diagnoses: Right arm pain; Small cell lung cancer, right (HCC)    mirtazapine (REMERON) 30 MG tablet TAKE 1 TABLET AT BEDTIME Qty: 90 tablet, Refills: 0    morphine (MS CONTIN) 30 MG 12 hr tablet Take 1 tablet (30 mg total) by mouth every 12 (twelve) hours. Qty: 60 tablet, Refills: 0   Associated Diagnoses: Midline low back pain without sciatica; Small cell carcinoma of lung, unspecified laterality ([place]); Lung mass; Malignant neoplasm  of lung, unspecified laterality, unspecified part of lung (HCC)    omeprazole (PRILOSEC) 40 MG capsule TAKE 1 CAPSULE (40 MG TOTAL)  BY MOUTH DAILY. Qty: 90 capsule, Refills: 0    ondansetron (ZOFRAN) 8 MG tablet Take 8 mg by mouth every 8 (eight) hours as needed for nausea. Reported on 02/12/2015    OXYGEN Inhale 2 L into the lungs continuous.    polyethylene glycol powder (MIRALAX) powder Take 17 g by mouth daily. Qty: 255 g, Refills: 0    PROAIR HFA 108 (90 BASE) MCG/ACT inhaler USE 2 INHALATIONS EVERY 6 HOURS AS NEEDED FOR WHEEZING OR SHORTNESS OF BREATH Qty: 8.5 g, Refills: 1    prochlorperazine (COMPAZINE) 10 MG tablet Take 1 tablet (10 mg total) by mouth every 6 (six) hours as needed for nausea or vomiting (nausea). Qty: 30 tablet, Refills: 3    XARELTO 20 MG TABS tablet TAKE 1 TABLET (20 MG TOTAL) BY MOUTH DAILY WITH SUPPER. Qty: 30 tablet, Refills: 0       Allergies  Allergen Reactions  . Decadron [Dexamethasone] Other (See Comments)    Slurred speech  . Augmentin [Amoxicillin-Pot Clavulanate] Itching and Rash    rash   Discharge Instructions    Diet general    Complete by:  As directed      Increase activity slowly    Complete by:  As directed           Discharge Exam: Filed Weights   03/26/15 1749  Weight: 48.626 kg (107 lb 3.2 oz)   Filed Vitals:   03/28/15 2144 03/29/15 0515  BP: 101/60 141/74  Pulse: 58 66  Temp: 97.8 F (36.6 C) 97.4 F (36.3 C)  Resp: 20 20   General: Appear in mild distress, no Rash; Oral Mucosa moist Cardiovascular: S1 and S2 Present, no Murmur, no JVD Respiratory: Bilateral Air entry present and Clear to Auscultation, no Crackles, no wheezes Abdomen: Bowel Sound present, Soft and no tenderness Extremities: no Pedal edema, no calf tenderness Neurology: Grossly no focal neuro deficit.  The results of significant diagnostics from this hospitalization (including imaging, microbiology, ancillary and laboratory) are listed below  for reference.    Significant Diagnostic Studies: Dg Chest 2 View  03/26/2015  CLINICAL DATA:  65 year old 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 EXAM: CHEST  2 VIEW COMPARISON:  CT chest 02/07/2015 and earlier. Chest x-rays 09/01/2014 and earlier. FINDINGS: Cardiac silhouette normal in size, unchanged. No evidence of significant hilar lymphadenopathy currently. Emphysematous changes throughout both lungs as noted previously. Airspace consolidation throughout the right lower lobe, not significantly changed dating back to at least late 2015. Discrete oval opacity in the right perihilar region, likely the superior segment right lower lobe, which likely corresponds to the dense airspace consolidation identified on the  most recent CT. No new abnormalities. Right jugular Port-A-Cath tip projects over the lower SVC, right jugular Port-A-Cath tip in the lower SVC at or near the cavoatrial junction, unchanged. IMPRESSION: 1. Chronic airspace opacities throughout the right lower lobe dating back to late 2015. 2. Discrete oval opacity in the right perihilar region corresponds to dense airspace consolidation in the superior segment right lower lobe identified on the chest CT 6 weeks ago. 3. No new/acute pulmonary abnormalities. Electronically Signed   By: Evangeline Dakin M.D.   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 EXAM: CT CERVICAL SPINE WITHOUT CONTRAST TECHNIQUE: Multidetector CT imaging of the cervical spine was performed without intravenous contrast. Multiplanar CT image reconstructions were also generated. COMPARISON:  No prior cervical spine imaging.  PET-CT 03/13/2014. FINDINGS: No  fractures identified involving the cervical spine. No evidence of osseous metastatic disease. Sagittal reconstructed images demonstrate anatomic alignment. Mild disc space narrowing and endplate hypertrophic changes at C6-7. Remaining disc spaces well preserved. Facet joints intact throughout without significant degenerative change. No spinal stenosis. Soft tissue window images demonstrate no frank disc protrusions. Coronal reformatted images demonstrate an intact craniocervical junction, intact C1-C2 articulation with degenerative changes, intact dens and intact lateral masses. No significant bony foraminal stenoses at any cervical level. IMPRESSION: Mild degenerative disc disease and spondylosis at C6-7 without associated disc protrusion or extrusion. No significant abnormality otherwise. Specifically, no evidence of cervical spine metastatic disease. Electronically Signed   By: Evangeline Dakin M.D.   On: 03/26/2015 16:32   Ct Lumbar Spine Wo Contrast  03/26/2015  CLINICAL DATA:  Lung cancer with brain metastasis. Leg weakness. Headaches. Back pain. EXAM: CT LUMBAR SPINE WITHOUT CONTRAST TECHNIQUE: Multidetector CT imaging of the lumbar spine was performed without intravenous contrast administration. Multiplanar CT image reconstructions were also generated. COMPARISON:  02/07/2015 abdominal pelvic CT. FINDINGS: Soft tissues: Advanced abdominal aortic and pelvic atherosclerosis. No imaged retroperitoneal adenopathy. Left renal fluid density lesion which is likely a cyst. No evidence of dominant intrathecal mass. Mild disc bulging at L3-4, causing mild central canal stenosis.Mild disc bulges are also identified at L4-5 and L5-S1. Bones: Stable left iliac peripherally sclerotic lesion at 1.0 cm has been present back to 06/15/2013 and is favored to be benign. No other focal osseous lesion identified. Maintenance of vertebral body height and alignment. Minimal convex left lumbar spine curvature. IMPRESSION: 1.  Lumbar spondylosis, without acute osseous finding. 2. No evidence of metastatic disease to the lumbosacral spine. Please note that CT is of low sensitivity, and the test of choice is pre and post contrast lumbar spine MRI. Electronically Signed   By: Abigail Miyamoto M.D.   On: 03/26/2015 16:36   Mr Jeri Cos ZO Contrast  03/26/2015  CLINICAL DATA:  65 year old male with extensive stage small cell lung cancer. Status post whole brain radiation in 2015. Most recent chemotherapy treatment in November 2016. Nausea, weakness, headaches. Subsequent encounter. EXAM: MRI HEAD WITHOUT AND WITH CONTRAST TECHNIQUE: Multiplanar, multiecho pulse sequences of the brain and surrounding structures were obtained without and with intravenous contrast. CONTRAST:  63m MULTIHANCE GADOBENATE DIMEGLUMINE 529 MG/ML IV SOLN COMPARISON:  Brain MRI 06/15/2014 and earlier FINDINGS: The area of the anterior right midbrain with questionable abnormal enhancement on 06/15/2014 today appears normal. However, there are numerous new brain metastases,  widespread throughout the bilateral cerebral and cerebellar hemispheres. There is right brainstem involvement at the pons where a round metastasis measures 7 mm (series 14, image 17). Lesion size ranges from 3-4 mm (right caudate nucleus) to 12 mm diameter (medial left thalamus). Occasional dural based lesions are identified (superior left parietal lobe series 13, image 15). At least 30 individual brain metastases are present. (At least 11 in the posterior fossa alone). Some of these are diffusion restricted in keeping with hypercellularity. A left caudate metastasis is mildly hemorrhagic (series 8, image 13). There is associated regional cerebral edema, most pronounced at the left thalamus and right brainstem. There is underlying chronic confluent cerebral white matter T2 and FLAIR hyperintensity from prior whole brain radiation. No acute or significant intracranial hemorrhage at this time. No midline  shift. Basilar cisterns remain patent. No ventriculomegaly. Mild generalized cerebral volume loss since 2016. Major intracranial vascular flow voids are stable. No restricted diffusion or evidence of acute infarction. Negative pituitary, cervicomedullary junction, and visualized cervical spinal cord. Visualized bone marrow signal is within normal limits. Visible internal auditory structures appear normal. Mastoids are clear. Paranasal sinuses are clear. Negative orbit and scalp soft tissues. 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 At least 30 small brain metastases ranging from 3-4 mm, up to 12 mm diameter (medial left thalamus). Occasional dural based lesions (superior left parietal lobe). Relatively mild cerebral edema with no significant intracranial mass effect at this time. Electronically Signed   By: Genevie Ann M.D.   On: 03/26/2015 17:34    Microbiology: Recent Results (from the past 240 hour(s))  Culture, Urine     Status: None   Collection Time: 03/26/15 11:50 PM  Result Value Ref Range Status   Specimen Description URINE, CLEAN CATCH  Final   Special Requests NONE  Final   Culture   Final    1,000 COLONIES/mL INSIGNIFICANT GROWTH Performed at [facility]    Report Status 03/28/2015 FINAL  Final     Labs: CBC:  Recent Labs Lab 03/26/15 1243 03/27/15 0549  WBC 6.6 6.2  NEUTROABS 5.5  --   HGB 11.2* 10.1*  HCT 33.7* 31.0*  MCV 101.2* 101.0*  PLT 161 235*   Basic Metabolic Panel:  Recent Labs Lab 03/26/15 1243  NA 135  K 4.1  CL 99*  CO2 29  GLUCOSE 146*  BUN 12  CREATININE 0.67  CALCIUM 8.5*   Liver Function Tests:  Recent Labs Lab 03/26/15 1243  AST 14*  ALT 9*  ALKPHOS 95  BILITOT 0.7  PROT 6.6  ALBUMIN 2.9*   No results for input(s): LIPASE, AMYLASE in the last 168 hours. No results for input(s): AMMONIA in the last 168 hours. Cardiac Enzymes: No results for  input(s): CKTOTAL, CKMB, CKMBINDEX, TROPONINI in the last 168 hours. BNP (last 3 results)  Recent Labs  09/01/14 1313  BNP 46.9   CBG:  Recent Labs Lab 03/27/15 2108 03/28/15 0759 03/28/15 1210 03/28/15 1712 03/28/15 2230  GLUCAP 144* 129* 117* 103* 103*   Time spent: 30 minutes  Signed:  Nguyen Todorov  [facility] 03/29/2015, 7:23 AM

## 2015-03-29 NOTE — Progress Notes (Signed)
Report called to [facility] and given to Maudie Mercury, [place].

## 2015-04-01 NOTE — ED Provider Notes (Signed)
CSN: [id number]     Arrival date & time 03/26/15  1144 History   First MD Initiated Contact with Patient 03/26/15 1222     Chief Complaint  Patient presents with  . Shortness of Breath  . Cancer       HPI  Expand All Collapse All   Pt with Hx of lung cancer with metastasis to brain c/o nausea, lightheaded, weakness in legs, headaches, back pain, malaise, urinary incontinence with dark brown malodorous urine, hemoptysis, SOB. C/o new pain to unaffected lung. Pt's last chemo treatment in late November 2016. Pt wears 2 L/min O2 at home        Past Medical History  Diagnosis Date  . Hyperlipidemia   . Hypertension   . GERD (gastroesophageal reflux disease)   . Respiratory failure with hypoxia ([place]) 04/21/2013    secondary to pneumonia/notes 04/21/2013  . Pulmonary embolism ([place])     "got one in there now" (04/21/2013)  . COPD (chronic obstructive pulmonary disease) ([place])   . Pneumonia 2/?01/2014    "wouldn't get better; hospitalized 04/21/2013)  . Arthritis     "minor in my right hand" (04/21/2013)  . History of radiation therapy 05/03/13-05/18/13    chest & whole brain  . Cancer ([place])     lung ca dx'd 03/2013  . Colon polyps     hyperplastic  . Right arm pain 08/21/2014   Past Surgical History  Procedure Laterality Date  . Inguinal hernia repair Right ~ 2005  . Finger surgery Left ~ 1989    "crushed end of my middle finger off"   . Video bronchoscopy Bilateral 04/26/2013    Procedure: VIDEO BRONCHOSCOPY WITH FLUORO;  Surgeon: Wilhelmina Mcardle, MD;  Location: [facility] ENDOSCOPY;  Service: Cardiopulmonary;  Laterality: Bilateral;   Family History  Problem Relation Age of Onset  . Breast cancer Sister   . Emphysema Mother   . Diabetes Father    Social History  Substance Use Topics  . Smoking status: Former Smoker -- 2.00 packs/day for 47 years    Types: Cigarettes    Quit date: 12/25/2011  . Smokeless tobacco: Never Used  . Alcohol Use: No     Comment: 04/21/2013 "used to drink a  little bit; very little; nothing in years"    Review of Systems  Unable to perform ROS: Acuity of condition      Allergies  Decadron and Augmentin  Home Medications   Prior to Admission medications   Medication Sig Start Date End Date Taking? Authorizing Provider  chlorpheniramine-HYDROcodone (TUSSIONEX) 10-8 MG/5ML SUER TAKE ONE TEASPOONFUL BY MOUTH EVERY 12 HOURS AS NEEDED FOR COUGH 02/27/15  Yes Curt Bears, MD  docusate sodium (COLACE) 100 MG capsule Take 300 mg by mouth 2 (two) times daily as needed for mild constipation.    Yes Historical Provider, MD  Iron-Vitamins (GERITOL PO) Take 1 tablet by mouth daily.   Yes Historical Provider, MD  lidocaine-prilocaine (EMLA) cream Apply to port site one hour before treatment and cover with plastic wrap. 12/25/14  Yes Curt Bears, MD  LORazepam (ATIVAN) 0.5 MG tablet TAKE ONE TABLET BY MOUTH TWICE DAILY AS NEEDED FOR NAUSEA/ANXIETY 03/12/15  Yes Curt Bears, MD  magnesium hydroxide (MILK OF MAGNESIA) 400 MG/5ML suspension Take 5 mLs by mouth daily as needed for mild constipation.   Yes Historical Provider, MD  mirtazapine (REMERON) 30 MG tablet TAKE 1 TABLET AT BEDTIME 02/20/15  Yes Curt Bears, MD  morphine (MS CONTIN) 30 MG 12 hr tablet  Take 1 tablet (30 mg total) by mouth every 12 (twelve) hours. 03/12/15  Yes Curt Bears, MD  omeprazole (PRILOSEC) 40 MG capsule TAKE 1 CAPSULE (40 MG TOTAL)  BY MOUTH DAILY. 03/16/15  Yes Curt Bears, MD  ondansetron (ZOFRAN) 8 MG tablet Take 8 mg by mouth every 8 (eight) hours as needed for nausea. Reported on 02/12/2015 09/29/14  Yes Historical Provider, MD  OXYGEN Inhale 2 L into the lungs continuous.   Yes Historical Provider, MD  polyethylene glycol powder (MIRALAX) powder Take 17 g by mouth daily. 08/20/13  Yes Jola Schmidt, MD  PROAIR HFA 108 (90 BASE) MCG/ACT inhaler USE 2 INHALATIONS EVERY 6 HOURS AS NEEDED FOR WHEEZING OR SHORTNESS OF BREATH 11/29/14  Yes Curt Bears, MD   prochlorperazine (COMPAZINE) 10 MG tablet Take 1 tablet (10 mg total) by mouth every 6 (six) hours as needed for nausea or vomiting (nausea). 09/04/14  Yes Adrena E Johnson, PA-C  XARELTO 20 MG TABS tablet TAKE 1 TABLET (20 MG TOTAL) BY MOUTH DAILY WITH SUPPER. 11/20/14  Yes Curt Bears, MD  dexamethasone (DECADRON) 4 MG tablet Take 1 tablet (4 mg total) by mouth daily. 03/29/15   Lavina Hamman, MD  oxyCODONE (OXY IR/ROXICODONE) 5 MG immediate release tablet Take 1 tablet (5 mg total) by mouth every 4 (four) hours as needed for severe pain or breakthrough pain. May take up to ii po q 6rhs prn for severe pain 03/29/15   Lavina Hamman, MD   BP 141/74 mmHg  Pulse 66  Temp(Src) 97.4 F (36.3 C) (Oral)  Resp 20  Ht '5\' 7"'$  (1.702 m)  Wt 107 lb 3.2 oz (48.626 kg)  BMI 16.79 kg/m2  SpO2 92% Physical Exam  Constitutional: He is oriented to person, place, and time. He appears well-developed and well-nourished. No distress.  HENT:  Head: Normocephalic and atraumatic.  Eyes: Pupils are equal, round, and reactive to light.  Neck: Normal range of motion.  Cardiovascular: Normal rate and intact distal pulses.   Pulmonary/Chest: No respiratory distress.  Abdominal: Normal appearance. He exhibits no distension. There is no rebound and no guarding.  Musculoskeletal: Normal range of motion.  Neurological: He is alert and oriented to person, place, and time. No cranial nerve deficit.  Skin: Skin is warm and dry. No rash noted.  Nursing note and vitals reviewed.   ED Course  Procedures (including critical care time) Labs Review Labs Reviewed  COMPREHENSIVE METABOLIC PANEL - Abnormal; Notable for the following:    Chloride 99 (*)    Glucose, Bld 146 (*)    Calcium 8.5 (*)    Albumin 2.9 (*)    AST 14 (*)    ALT 9 (*)    All other components within normal limits  CBC WITH DIFFERENTIAL/PLATELET - Abnormal; Notable for the following:    RBC 3.33 (*)    Hemoglobin 11.2 (*)    HCT 33.7 (*)    MCV  101.2 (*)    All other components within normal limits  CBC - Abnormal; Notable for the following:    RBC 3.07 (*)    Hemoglobin 10.1 (*)    HCT 31.0 (*)    MCV 101.0 (*)    Platelets 146 (*)    All other components within normal limits  GLUCOSE, CAPILLARY - Abnormal; Notable for the following:    Glucose-Capillary 140 (*)    All other components within normal limits  GLUCOSE, CAPILLARY - Abnormal; Notable for the following:  Glucose-Capillary 205 (*)    All other components within normal limits  GLUCOSE, CAPILLARY - Abnormal; Notable for the following:    Glucose-Capillary 144 (*)    All other components within normal limits  GLUCOSE, CAPILLARY - Abnormal; Notable for the following:    Glucose-Capillary 129 (*)    All other components within normal limits  GLUCOSE, CAPILLARY - Abnormal; Notable for the following:    Glucose-Capillary 117 (*)    All other components within normal limits  GLUCOSE, CAPILLARY - Abnormal; Notable for the following:    Glucose-Capillary 103 (*)    All other components within normal limits  GLUCOSE, CAPILLARY - Abnormal; Notable for the following:    Glucose-Capillary 103 (*)    All other components within normal limits  GLUCOSE, CAPILLARY - Abnormal; Notable for the following:    Glucose-Capillary 128 (*)    All other components within normal limits  URINE CULTURE  URINALYSIS, ROUTINE W REFLEX MICROSCOPIC (NOT AT [facility])  CORTISOL  I-STAT CG4 LACTIC ACID, ED  I-STAT TROPOININ, ED  I-STAT CG4 LACTIC ACID, ED    Imaging Review IMPRESSION: Widespread recurrent metastatic disease to the brain in this patient who received whole brain radiation in 2015. At least 30 small brain metastases ranging from 3-4 mm, up to 12 mm diameter (medial left thalamus). Occasional dural based lesions (superior left parietal lobe). Relatively mild cerebral edema with no significant intracranial mass effect at this time.   EKG Interpretation   Date/Time:   Monday March 26 2015 12:07:19 EST Ventricular Rate:  89 PR Interval:  139 QRS Duration: 76 QT Interval:  325 QTC Calculation: 395 R Axis:   92 Text Interpretation:  Sinus rhythm Right axis deviation ST elevation,  consider inferior injury Baseline wander in lead(s) V5 since last tracing  no significant change Confirmed by Eulis Foster  MD, Vira Agar ([id number]) on 03/26/2015  12:23:23 PM      MDM   Final diagnoses:  Brain metastases ([place])  Back pain  Small cell carcinoma of lung, right (HCC)  Midline low back pain without sciatica  Small cell carcinoma of lung, unspecified laterality (HCC)  Lung mass  Malignant neoplasm of lung, unspecified laterality, unspecified part of lung (HCC)        Leonard Schwartz, MD 04/01/15 [phone]

## 2015-04-11 ENCOUNTER — Telehealth: Payer: Self-pay | Admitting: Medical Oncology

## 2015-04-11 NOTE — Telephone Encounter (Signed)
Pt died 2015/04/17

## 2015-04-25 DEATH — deceased

## 2015-05-09 ENCOUNTER — Other Ambulatory Visit: Payer: Self-pay | Admitting: Internal Medicine

## 2015-05-14 ENCOUNTER — Other Ambulatory Visit: Payer: BLUE CROSS/BLUE SHIELD

## 2015-05-14 ENCOUNTER — Ambulatory Visit: Payer: BLUE CROSS/BLUE SHIELD | Admitting: Internal Medicine

## 2015-05-17 ENCOUNTER — Ambulatory Visit: Payer: BLUE CROSS/BLUE SHIELD | Admitting: Internal Medicine

## 2015-05-19 ENCOUNTER — Other Ambulatory Visit: Payer: Self-pay | Admitting: Internal Medicine

## 2015-05-20 ENCOUNTER — Other Ambulatory Visit: Payer: Self-pay | Admitting: Internal Medicine

## 2016-09-27 IMAGING — CT CT CHEST W/ CM
2 of 5 series · 16 of 46 positions shown, 18 images · IV contrast (OMNIPAQUE)
Comparison: CT 09/01/2014

CLINICAL DATA: Restaging lung cancer diagnosed 5540

EXAM:
CT CHEST, ABDOMEN, AND PELVIS WITH CONTRAST
TECHNIQUE: Multidetector CT imaging of the chest, abdomen and pelvis was
performed following the standard protocol during bolus
administration of intravenous contrast.
CONTRAST:  100mL OMNIPAQUE IOHEXOL 300 MG/ML  SOLN

[Series 2: cap with st · axial · 0.78mm/px · z∈[-650,-80]mm · 13 of 130 slices shown, 15 images]
[im 8/130  soft-tissue]
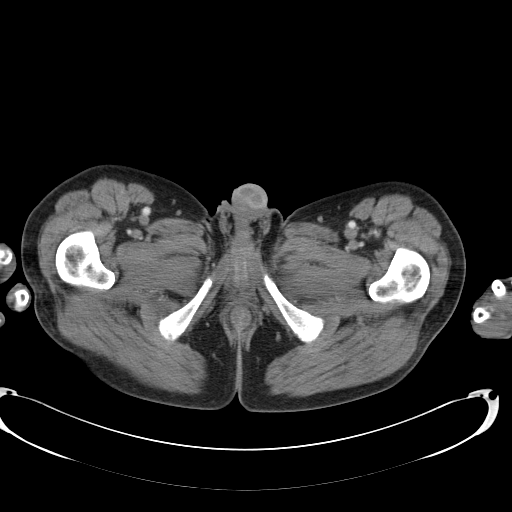
[im 8/130  bone]
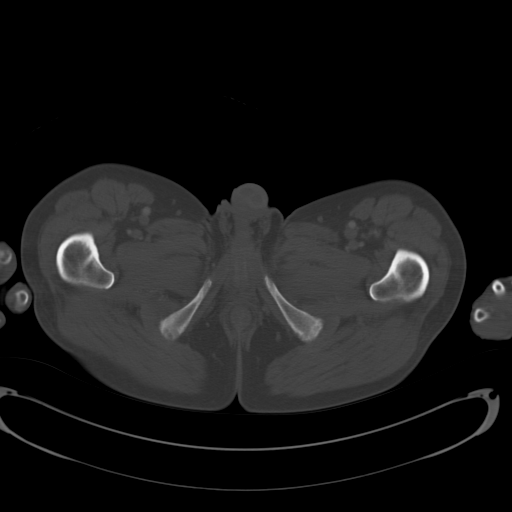
[im 16/130  soft-tissue]
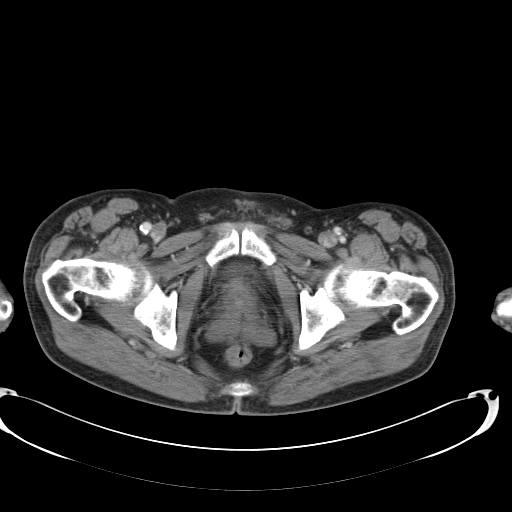
[im 31/130  soft-tissue]
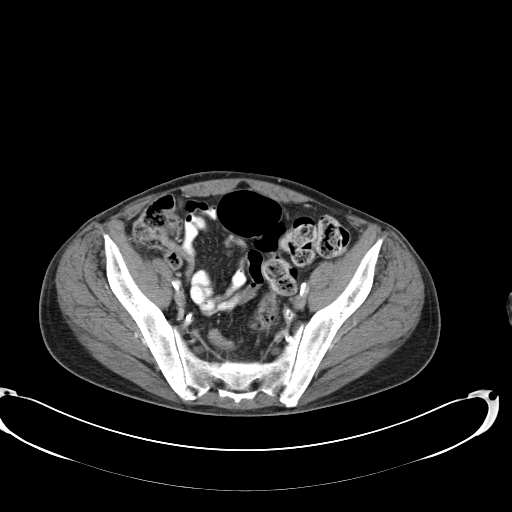
[im 38/130  soft-tissue]
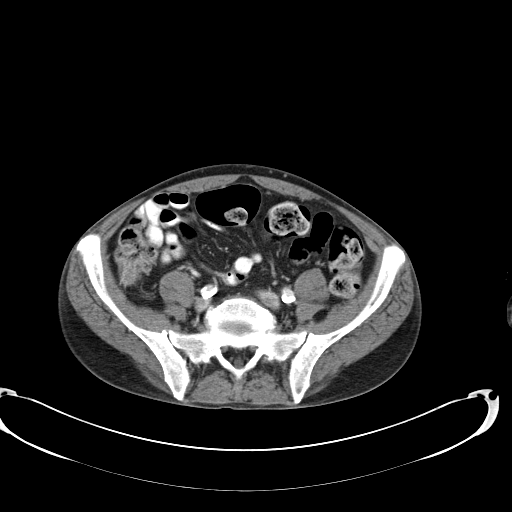
[im 46/130  soft-tissue]
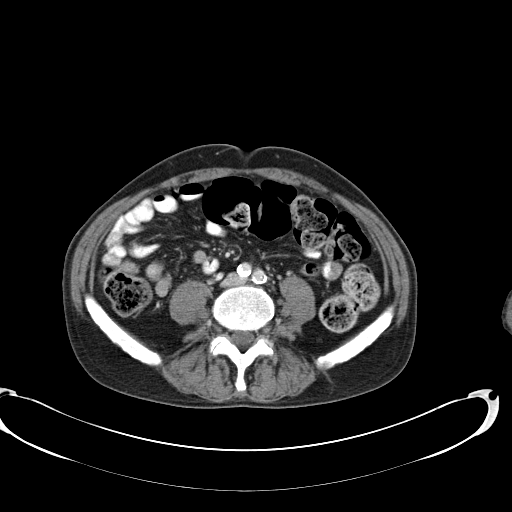
[im 54/130  soft-tissue]
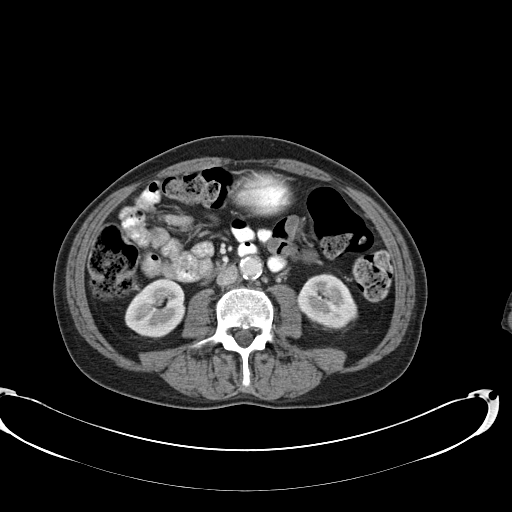
[im 69/130  soft-tissue]
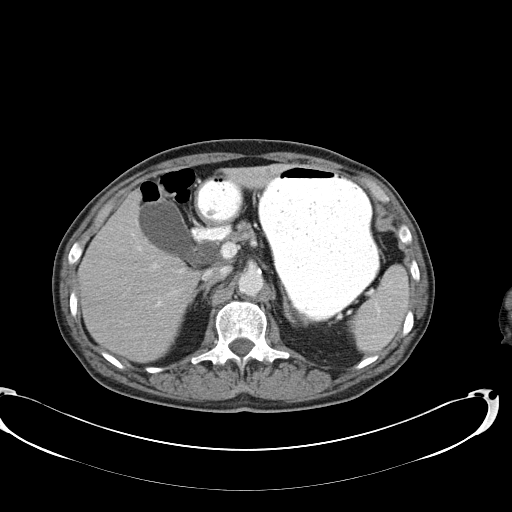
[im 76/130  soft-tissue]
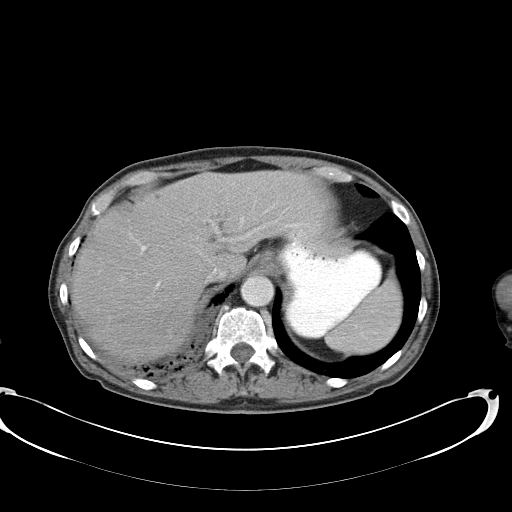
[im 84/130  soft-tissue]
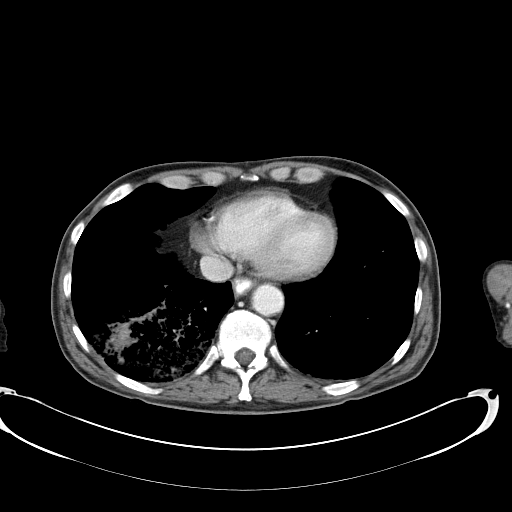
[im 84/130  bone]
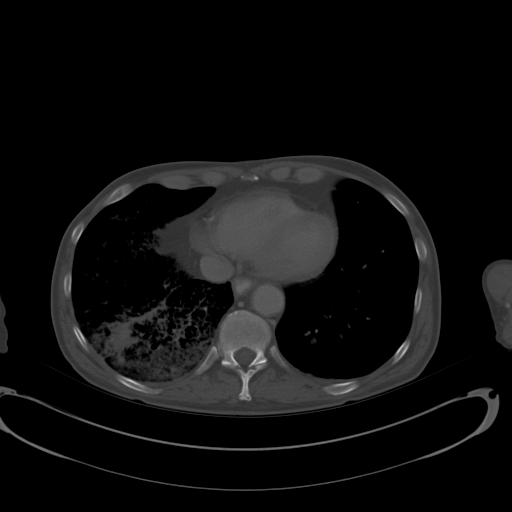
[im 92/130  soft-tissue]
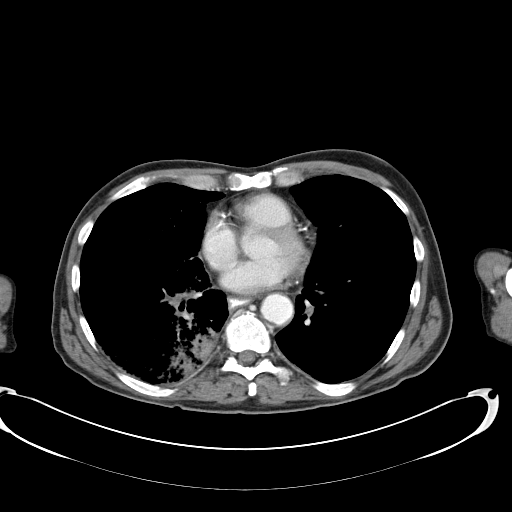
[im 99/130  soft-tissue]
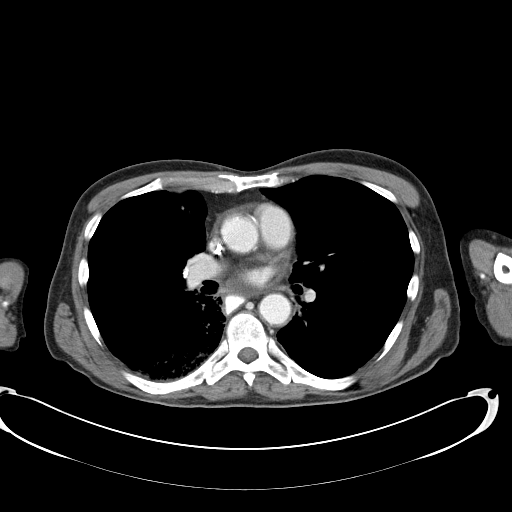
[im 114/130  soft-tissue]
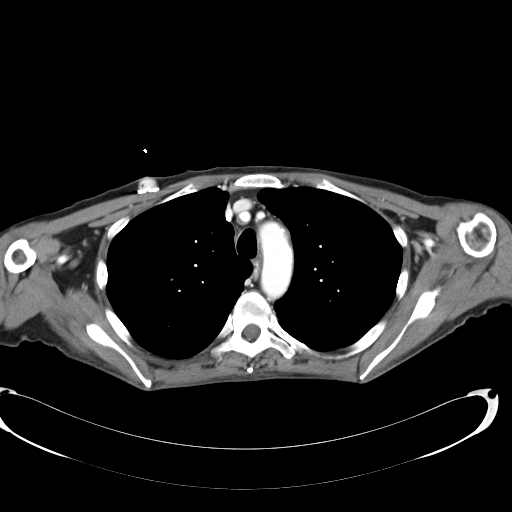
[im 122/130  soft-tissue]
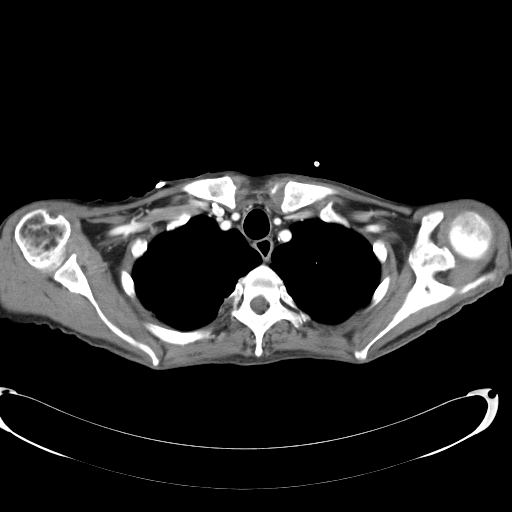

[Series 602: <mpr thick range> · coronal · 1.27mm/px · 3 of 73 slices shown]
[im 25/73  soft-tissue]
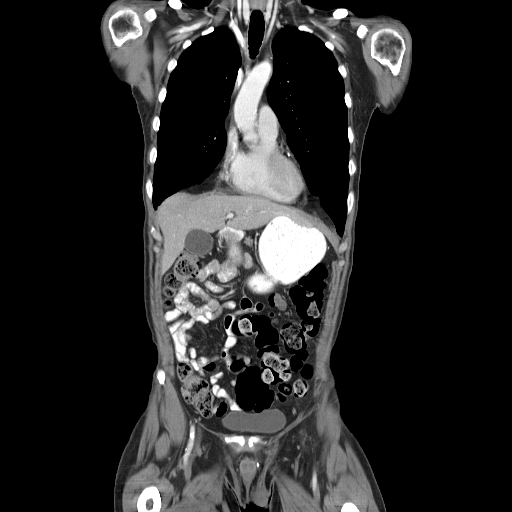
[im 33/73  soft-tissue]
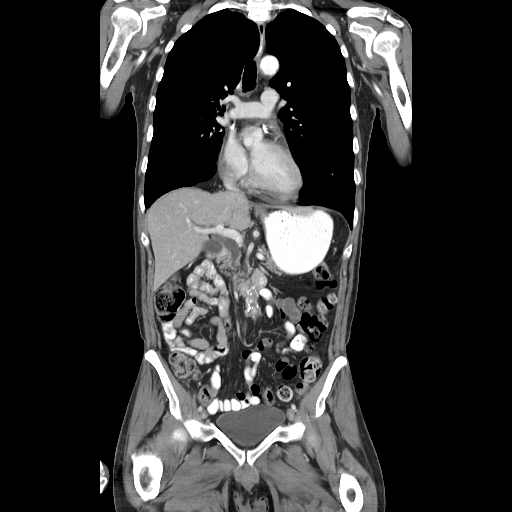
[im 41/73  soft-tissue]
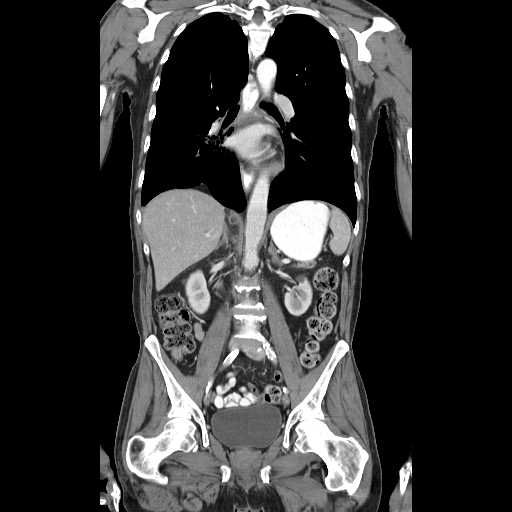

[16 of 46 positions shown; findings below may reference images not displayed]

FINDINGS: CT CHEST FINDINGS

Mediastinum/Nodes: No axillary or supraclavicular lymphadenopathy.
No mediastinal hilar adenopathy. No pericardial fluid. Coronary
calcifications noted. Contrast reflux into the esophagus.

Lungs/Pleura: There is consolidative pattern within the RIGHT lower
lobe superimposed on centrilobular emphysema. The density of the
consolidation has improved compared to CT 09/01/2014. Additionally,
medial RIGHT upper lobe nodularity has resolved along the
mediastinum. Discrete 5 mm nodule in the RIGHT upper lobe on image
33, series 4 is new. LEFT lung is clear.

CT ABDOMEN AND PELVIS FINDINGS

Hepatobiliary: 4 mm low-density lesion the RIGHT hepatic lobe is
unchanged. Low-density lesion anterior central LEFT hepatic lobe
measuring 4 mm image 55 is also unchanged. The common bile duct is
dilated similar prior.

Pancreas: Pancreas is normal. No ductal dilatation. No pancreatic
inflammation.

Spleen: Normal spleen

Adrenals/urinary tract: Adrenal glands kidneys are normal. There is
low-density lesions in the LEFT kidney unchanged. Ureters and
bladder normal.

Stomach/Bowel: Stomach, small bowel, appendix, and cecum are normal.
The colon and rectosigmoid colon are normal.

Vascular/Lymphatic: Abdominal aorta is normal caliber with
atherosclerotic calcification. There is no retroperitoneal or
periportal lymphadenopathy. No pelvic lymphadenopathy.

Reproductive: Prostate normal

Musculoskeletal: New sclerotic lesion in the T10 vertebral body
measuring 9 mm on image 41, series 2.

Well-circumscribed sclerotic lesion in the LEFT iliac bone measuring
9 mm is unchanged

Other: No free fluid.
IMPRESSION: Chest Impression:

1. Interval improvemed consolidation in the RIGHT lower lobe
suggests resolving infectious or inflammatory process. Recommend
attention on follow-up as nodularity does persist.
2. Improvement in nodularity in the medial RIGHT upper lobe also
consistent with resolving infectious or inflammatory process.
3. New 5 pulmonary nodule in the RIGHT upper lobe is indeterminate.
Recommend attention on follow-up.

Abdomen / Pelvis Impression:

1. No evidence of metastasis in the soft tissues of the abdomen or
pelvis.
2. NEW SCLEROTIC LESION WITHIN THE T10 VERTEBRAL BODY IS CONSISTENT
WITH SKELETAL METASTASIS.
